# Patient Record
Sex: Female | Born: 1960 | Race: White | Hispanic: No | Smoking: Never smoker
Health system: Southern US, Community
[De-identification: ages and names within clinical notes are randomized; demographics above are authoritative.]

## PROBLEM LIST (undated history)

## (undated) DIAGNOSIS — E1165 Type 2 diabetes mellitus with hyperglycemia: Secondary | ICD-10-CM

## (undated) DIAGNOSIS — K2990 Gastroduodenitis, unspecified, without bleeding: Secondary | ICD-10-CM

## (undated) DIAGNOSIS — J449 Chronic obstructive pulmonary disease, unspecified: Secondary | ICD-10-CM

## (undated) DIAGNOSIS — I509 Heart failure, unspecified: Secondary | ICD-10-CM

## (undated) DIAGNOSIS — S88119A Complete traumatic amputation at level between knee and ankle, unspecified lower leg, initial encounter: Secondary | ICD-10-CM

## (undated) DIAGNOSIS — G459 Transient cerebral ischemic attack, unspecified: Secondary | ICD-10-CM

## (undated) DIAGNOSIS — J189 Pneumonia, unspecified organism: Secondary | ICD-10-CM

## (undated) DIAGNOSIS — IMO0002 Reserved for concepts with insufficient information to code with codable children: Secondary | ICD-10-CM

## (undated) HISTORY — PX: BELOW KNEE LEG AMPUTATION: SUR23

---

## 2019-09-22 DIAGNOSIS — J189 Pneumonia, unspecified organism: Secondary | ICD-10-CM | POA: Diagnosis not present

## 2019-09-22 DIAGNOSIS — I10 Essential (primary) hypertension: Secondary | ICD-10-CM | POA: Diagnosis not present

## 2019-09-22 DIAGNOSIS — I509 Heart failure, unspecified: Secondary | ICD-10-CM | POA: Diagnosis not present

## 2019-09-22 DIAGNOSIS — D649 Anemia, unspecified: Secondary | ICD-10-CM

## 2019-09-22 DIAGNOSIS — R079 Chest pain, unspecified: Secondary | ICD-10-CM | POA: Diagnosis not present

## 2019-09-23 DIAGNOSIS — R079 Chest pain, unspecified: Secondary | ICD-10-CM | POA: Diagnosis not present

## 2019-09-23 DIAGNOSIS — I509 Heart failure, unspecified: Secondary | ICD-10-CM | POA: Diagnosis not present

## 2019-09-23 DIAGNOSIS — J189 Pneumonia, unspecified organism: Secondary | ICD-10-CM | POA: Diagnosis not present

## 2019-09-23 DIAGNOSIS — I10 Essential (primary) hypertension: Secondary | ICD-10-CM | POA: Diagnosis not present

## 2019-09-24 DIAGNOSIS — I509 Heart failure, unspecified: Secondary | ICD-10-CM | POA: Diagnosis not present

## 2019-09-24 DIAGNOSIS — J189 Pneumonia, unspecified organism: Secondary | ICD-10-CM | POA: Diagnosis not present

## 2019-09-24 DIAGNOSIS — I10 Essential (primary) hypertension: Secondary | ICD-10-CM | POA: Diagnosis not present

## 2019-09-24 DIAGNOSIS — R079 Chest pain, unspecified: Secondary | ICD-10-CM | POA: Diagnosis not present

## 2019-09-25 DIAGNOSIS — R079 Chest pain, unspecified: Secondary | ICD-10-CM | POA: Diagnosis not present

## 2019-09-25 DIAGNOSIS — J189 Pneumonia, unspecified organism: Secondary | ICD-10-CM | POA: Diagnosis not present

## 2019-09-25 DIAGNOSIS — I509 Heart failure, unspecified: Secondary | ICD-10-CM | POA: Diagnosis not present

## 2019-09-25 DIAGNOSIS — I10 Essential (primary) hypertension: Secondary | ICD-10-CM | POA: Diagnosis not present

## 2019-10-01 DIAGNOSIS — J189 Pneumonia, unspecified organism: Secondary | ICD-10-CM

## 2019-10-01 DIAGNOSIS — R079 Chest pain, unspecified: Secondary | ICD-10-CM | POA: Diagnosis not present

## 2019-10-01 DIAGNOSIS — I340 Nonrheumatic mitral (valve) insufficiency: Secondary | ICD-10-CM | POA: Diagnosis not present

## 2019-10-01 DIAGNOSIS — I10 Essential (primary) hypertension: Secondary | ICD-10-CM | POA: Diagnosis not present

## 2019-10-01 DIAGNOSIS — D649 Anemia, unspecified: Secondary | ICD-10-CM | POA: Diagnosis not present

## 2019-10-01 DIAGNOSIS — J90 Pleural effusion, not elsewhere classified: Secondary | ICD-10-CM | POA: Diagnosis not present

## 2019-10-01 DIAGNOSIS — I509 Heart failure, unspecified: Secondary | ICD-10-CM

## 2019-10-02 DIAGNOSIS — R079 Chest pain, unspecified: Secondary | ICD-10-CM | POA: Diagnosis not present

## 2019-10-02 DIAGNOSIS — J90 Pleural effusion, not elsewhere classified: Secondary | ICD-10-CM | POA: Diagnosis not present

## 2019-10-02 DIAGNOSIS — I10 Essential (primary) hypertension: Secondary | ICD-10-CM | POA: Diagnosis not present

## 2019-10-02 DIAGNOSIS — D649 Anemia, unspecified: Secondary | ICD-10-CM | POA: Diagnosis not present

## 2019-10-03 DIAGNOSIS — J90 Pleural effusion, not elsewhere classified: Secondary | ICD-10-CM | POA: Diagnosis not present

## 2019-10-03 DIAGNOSIS — R079 Chest pain, unspecified: Secondary | ICD-10-CM | POA: Diagnosis not present

## 2019-10-03 DIAGNOSIS — D649 Anemia, unspecified: Secondary | ICD-10-CM | POA: Diagnosis not present

## 2019-10-03 DIAGNOSIS — I10 Essential (primary) hypertension: Secondary | ICD-10-CM | POA: Diagnosis not present

## 2019-10-04 DIAGNOSIS — R079 Chest pain, unspecified: Secondary | ICD-10-CM | POA: Diagnosis not present

## 2019-10-04 DIAGNOSIS — I10 Essential (primary) hypertension: Secondary | ICD-10-CM | POA: Diagnosis not present

## 2019-10-04 DIAGNOSIS — J90 Pleural effusion, not elsewhere classified: Secondary | ICD-10-CM | POA: Diagnosis not present

## 2019-10-04 DIAGNOSIS — D649 Anemia, unspecified: Secondary | ICD-10-CM | POA: Diagnosis not present

## 2019-10-05 DIAGNOSIS — R079 Chest pain, unspecified: Secondary | ICD-10-CM | POA: Diagnosis not present

## 2019-10-05 DIAGNOSIS — D649 Anemia, unspecified: Secondary | ICD-10-CM | POA: Diagnosis not present

## 2019-10-05 DIAGNOSIS — I10 Essential (primary) hypertension: Secondary | ICD-10-CM | POA: Diagnosis not present

## 2019-10-05 DIAGNOSIS — J90 Pleural effusion, not elsewhere classified: Secondary | ICD-10-CM | POA: Diagnosis not present

## 2019-10-06 DIAGNOSIS — R079 Chest pain, unspecified: Secondary | ICD-10-CM | POA: Diagnosis not present

## 2019-10-06 DIAGNOSIS — D649 Anemia, unspecified: Secondary | ICD-10-CM | POA: Diagnosis not present

## 2019-10-06 DIAGNOSIS — I10 Essential (primary) hypertension: Secondary | ICD-10-CM | POA: Diagnosis not present

## 2019-10-06 DIAGNOSIS — J90 Pleural effusion, not elsewhere classified: Secondary | ICD-10-CM | POA: Diagnosis not present

## 2019-10-15 DIAGNOSIS — J90 Pleural effusion, not elsewhere classified: Secondary | ICD-10-CM

## 2019-10-15 DIAGNOSIS — I10 Essential (primary) hypertension: Secondary | ICD-10-CM

## 2019-10-15 DIAGNOSIS — R109 Unspecified abdominal pain: Secondary | ICD-10-CM

## 2019-10-15 DIAGNOSIS — I509 Heart failure, unspecified: Secondary | ICD-10-CM | POA: Diagnosis not present

## 2019-10-16 DIAGNOSIS — I10 Essential (primary) hypertension: Secondary | ICD-10-CM | POA: Diagnosis not present

## 2019-10-16 DIAGNOSIS — R109 Unspecified abdominal pain: Secondary | ICD-10-CM | POA: Diagnosis not present

## 2019-10-16 DIAGNOSIS — J90 Pleural effusion, not elsewhere classified: Secondary | ICD-10-CM | POA: Diagnosis not present

## 2019-10-16 DIAGNOSIS — I509 Heart failure, unspecified: Secondary | ICD-10-CM | POA: Diagnosis not present

## 2019-10-17 DIAGNOSIS — J90 Pleural effusion, not elsewhere classified: Secondary | ICD-10-CM | POA: Diagnosis not present

## 2019-10-17 DIAGNOSIS — I509 Heart failure, unspecified: Secondary | ICD-10-CM | POA: Diagnosis not present

## 2019-10-17 DIAGNOSIS — I10 Essential (primary) hypertension: Secondary | ICD-10-CM | POA: Diagnosis not present

## 2019-10-17 DIAGNOSIS — R109 Unspecified abdominal pain: Secondary | ICD-10-CM | POA: Diagnosis not present

## 2019-10-18 DIAGNOSIS — R109 Unspecified abdominal pain: Secondary | ICD-10-CM | POA: Diagnosis not present

## 2019-10-18 DIAGNOSIS — I509 Heart failure, unspecified: Secondary | ICD-10-CM | POA: Diagnosis not present

## 2019-10-18 DIAGNOSIS — I10 Essential (primary) hypertension: Secondary | ICD-10-CM | POA: Diagnosis not present

## 2019-10-18 DIAGNOSIS — J90 Pleural effusion, not elsewhere classified: Secondary | ICD-10-CM | POA: Diagnosis not present

## 2019-10-19 DIAGNOSIS — I509 Heart failure, unspecified: Secondary | ICD-10-CM | POA: Diagnosis not present

## 2019-10-19 DIAGNOSIS — R109 Unspecified abdominal pain: Secondary | ICD-10-CM | POA: Diagnosis not present

## 2019-10-19 DIAGNOSIS — J90 Pleural effusion, not elsewhere classified: Secondary | ICD-10-CM | POA: Diagnosis not present

## 2019-10-19 DIAGNOSIS — I10 Essential (primary) hypertension: Secondary | ICD-10-CM | POA: Diagnosis not present

## 2019-10-20 DIAGNOSIS — I313 Pericardial effusion (noninflammatory): Secondary | ICD-10-CM

## 2019-10-20 DIAGNOSIS — I361 Nonrheumatic tricuspid (valve) insufficiency: Secondary | ICD-10-CM | POA: Diagnosis not present

## 2019-10-20 DIAGNOSIS — I2510 Atherosclerotic heart disease of native coronary artery without angina pectoris: Secondary | ICD-10-CM

## 2019-10-20 DIAGNOSIS — I340 Nonrheumatic mitral (valve) insufficiency: Secondary | ICD-10-CM | POA: Diagnosis not present

## 2019-10-20 DIAGNOSIS — R109 Unspecified abdominal pain: Secondary | ICD-10-CM | POA: Diagnosis not present

## 2019-10-20 DIAGNOSIS — J90 Pleural effusion, not elsewhere classified: Secondary | ICD-10-CM | POA: Diagnosis not present

## 2019-10-20 DIAGNOSIS — R0602 Shortness of breath: Secondary | ICD-10-CM

## 2019-10-20 DIAGNOSIS — I10 Essential (primary) hypertension: Secondary | ICD-10-CM | POA: Diagnosis not present

## 2019-10-20 DIAGNOSIS — I509 Heart failure, unspecified: Secondary | ICD-10-CM

## 2019-10-20 DIAGNOSIS — D649 Anemia, unspecified: Secondary | ICD-10-CM

## 2019-10-21 ENCOUNTER — Inpatient Hospital Stay (HOSPITAL_COMMUNITY)
Admission: AD | Admit: 2019-10-21 | Discharge: 2019-11-06 | DRG: 246 | Disposition: A | Discharge status: Skilled Nursing Facility | Payer: Medicaid Other | Source: Other Acute Inpatient Hospital | Attending: Internal Medicine | Admitting: Internal Medicine

## 2019-10-21 ENCOUNTER — Encounter (HOSPITAL_COMMUNITY): Payer: Self-pay | Admitting: Cardiology

## 2019-10-21 DIAGNOSIS — E876 Hypokalemia: Secondary | ICD-10-CM | POA: Diagnosis not present

## 2019-10-21 DIAGNOSIS — R160 Hepatomegaly, not elsewhere classified: Secondary | ICD-10-CM | POA: Diagnosis present

## 2019-10-21 DIAGNOSIS — K219 Gastro-esophageal reflux disease without esophagitis: Secondary | ICD-10-CM | POA: Diagnosis present

## 2019-10-21 DIAGNOSIS — F419 Anxiety disorder, unspecified: Secondary | ICD-10-CM | POA: Diagnosis not present

## 2019-10-21 DIAGNOSIS — I739 Peripheral vascular disease, unspecified: Secondary | ICD-10-CM | POA: Diagnosis present

## 2019-10-21 DIAGNOSIS — I5033 Acute on chronic diastolic (congestive) heart failure: Secondary | ICD-10-CM

## 2019-10-21 DIAGNOSIS — I701 Atherosclerosis of renal artery: Secondary | ICD-10-CM | POA: Diagnosis present

## 2019-10-21 DIAGNOSIS — E1151 Type 2 diabetes mellitus with diabetic peripheral angiopathy without gangrene: Secondary | ICD-10-CM | POA: Diagnosis present

## 2019-10-21 DIAGNOSIS — I5043 Acute on chronic combined systolic (congestive) and diastolic (congestive) heart failure: Secondary | ICD-10-CM | POA: Diagnosis present

## 2019-10-21 DIAGNOSIS — Z7189 Other specified counseling: Secondary | ICD-10-CM

## 2019-10-21 DIAGNOSIS — I509 Heart failure, unspecified: Secondary | ICD-10-CM | POA: Diagnosis not present

## 2019-10-21 DIAGNOSIS — I5031 Acute diastolic (congestive) heart failure: Secondary | ICD-10-CM

## 2019-10-21 DIAGNOSIS — I10 Essential (primary) hypertension: Secondary | ICD-10-CM | POA: Diagnosis not present

## 2019-10-21 DIAGNOSIS — N183 Chronic kidney disease, stage 3 unspecified: Secondary | ICD-10-CM | POA: Diagnosis present

## 2019-10-21 DIAGNOSIS — R0689 Other abnormalities of breathing: Secondary | ICD-10-CM

## 2019-10-21 DIAGNOSIS — Z955 Presence of coronary angioplasty implant and graft: Secondary | ICD-10-CM

## 2019-10-21 DIAGNOSIS — R9389 Abnormal findings on diagnostic imaging of other specified body structures: Secondary | ICD-10-CM

## 2019-10-21 DIAGNOSIS — Z79899 Other long term (current) drug therapy: Secondary | ICD-10-CM

## 2019-10-21 DIAGNOSIS — F329 Major depressive disorder, single episode, unspecified: Secondary | ICD-10-CM | POA: Diagnosis present

## 2019-10-21 DIAGNOSIS — Z794 Long term (current) use of insulin: Secondary | ICD-10-CM

## 2019-10-21 DIAGNOSIS — N179 Acute kidney failure, unspecified: Secondary | ICD-10-CM

## 2019-10-21 DIAGNOSIS — J440 Chronic obstructive pulmonary disease with acute lower respiratory infection: Secondary | ICD-10-CM | POA: Diagnosis present

## 2019-10-21 DIAGNOSIS — R0602 Shortness of breath: Secondary | ICD-10-CM | POA: Diagnosis present

## 2019-10-21 DIAGNOSIS — E1165 Type 2 diabetes mellitus with hyperglycemia: Secondary | ICD-10-CM | POA: Diagnosis present

## 2019-10-21 DIAGNOSIS — I130 Hypertensive heart and chronic kidney disease with heart failure and stage 1 through stage 4 chronic kidney disease, or unspecified chronic kidney disease: Principal | ICD-10-CM | POA: Diagnosis present

## 2019-10-21 DIAGNOSIS — Z833 Family history of diabetes mellitus: Secondary | ICD-10-CM

## 2019-10-21 DIAGNOSIS — I2583 Coronary atherosclerosis due to lipid rich plaque: Secondary | ICD-10-CM | POA: Diagnosis not present

## 2019-10-21 DIAGNOSIS — Z888 Allergy status to other drugs, medicaments and biological substances status: Secondary | ICD-10-CM

## 2019-10-21 DIAGNOSIS — D631 Anemia in chronic kidney disease: Secondary | ICD-10-CM | POA: Diagnosis present

## 2019-10-21 DIAGNOSIS — J90 Pleural effusion, not elsewhere classified: Secondary | ICD-10-CM

## 2019-10-21 DIAGNOSIS — Z89511 Acquired absence of right leg below knee: Secondary | ICD-10-CM

## 2019-10-21 DIAGNOSIS — D500 Iron deficiency anemia secondary to blood loss (chronic): Secondary | ICD-10-CM | POA: Diagnosis not present

## 2019-10-21 DIAGNOSIS — R112 Nausea with vomiting, unspecified: Secondary | ICD-10-CM

## 2019-10-21 DIAGNOSIS — I150 Renovascular hypertension: Secondary | ICD-10-CM

## 2019-10-21 DIAGNOSIS — Z9889 Other specified postprocedural states: Secondary | ICD-10-CM

## 2019-10-21 DIAGNOSIS — U071 COVID-19: Secondary | ICD-10-CM | POA: Diagnosis present

## 2019-10-21 DIAGNOSIS — I2510 Atherosclerotic heart disease of native coronary artery without angina pectoris: Secondary | ICD-10-CM | POA: Diagnosis present

## 2019-10-21 DIAGNOSIS — I5032 Chronic diastolic (congestive) heart failure: Secondary | ICD-10-CM | POA: Diagnosis present

## 2019-10-21 DIAGNOSIS — I161 Hypertensive emergency: Secondary | ICD-10-CM | POA: Diagnosis present

## 2019-10-21 DIAGNOSIS — Z515 Encounter for palliative care: Secondary | ICD-10-CM

## 2019-10-21 DIAGNOSIS — R109 Unspecified abdominal pain: Secondary | ICD-10-CM

## 2019-10-21 DIAGNOSIS — D649 Anemia, unspecified: Secondary | ICD-10-CM

## 2019-10-21 DIAGNOSIS — J449 Chronic obstructive pulmonary disease, unspecified: Secondary | ICD-10-CM | POA: Diagnosis not present

## 2019-10-21 DIAGNOSIS — Z8673 Personal history of transient ischemic attack (TIA), and cerebral infarction without residual deficits: Secondary | ICD-10-CM

## 2019-10-21 DIAGNOSIS — F064 Anxiety disorder due to known physiological condition: Secondary | ICD-10-CM | POA: Diagnosis present

## 2019-10-21 DIAGNOSIS — J9621 Acute and chronic respiratory failure with hypoxia: Secondary | ICD-10-CM | POA: Diagnosis present

## 2019-10-21 DIAGNOSIS — N171 Acute kidney failure with acute cortical necrosis: Secondary | ICD-10-CM | POA: Diagnosis not present

## 2019-10-21 DIAGNOSIS — E785 Hyperlipidemia, unspecified: Secondary | ICD-10-CM | POA: Diagnosis present

## 2019-10-21 DIAGNOSIS — Z9981 Dependence on supplemental oxygen: Secondary | ICD-10-CM

## 2019-10-21 DIAGNOSIS — E7800 Pure hypercholesterolemia, unspecified: Secondary | ICD-10-CM | POA: Diagnosis present

## 2019-10-21 DIAGNOSIS — J1282 Pneumonia due to coronavirus disease 2019: Secondary | ICD-10-CM | POA: Diagnosis present

## 2019-10-21 DIAGNOSIS — J810 Acute pulmonary edema: Secondary | ICD-10-CM | POA: Diagnosis not present

## 2019-10-21 DIAGNOSIS — D259 Leiomyoma of uterus, unspecified: Secondary | ICD-10-CM | POA: Diagnosis present

## 2019-10-21 DIAGNOSIS — R404 Transient alteration of awareness: Secondary | ICD-10-CM | POA: Diagnosis not present

## 2019-10-21 DIAGNOSIS — J918 Pleural effusion in other conditions classified elsewhere: Secondary | ICD-10-CM | POA: Diagnosis present

## 2019-10-21 DIAGNOSIS — Z885 Allergy status to narcotic agent status: Secondary | ICD-10-CM

## 2019-10-21 DIAGNOSIS — Z8249 Family history of ischemic heart disease and other diseases of the circulatory system: Secondary | ICD-10-CM

## 2019-10-21 DIAGNOSIS — J189 Pneumonia, unspecified organism: Secondary | ICD-10-CM

## 2019-10-21 DIAGNOSIS — E1152 Type 2 diabetes mellitus with diabetic peripheral angiopathy with gangrene: Secondary | ICD-10-CM | POA: Diagnosis present

## 2019-10-21 HISTORY — DX: Chronic diastolic (congestive) heart failure: I50.32

## 2019-10-21 HISTORY — DX: Type 2 diabetes mellitus with hyperglycemia: E11.65

## 2019-10-21 HISTORY — DX: Reserved for concepts with insufficient information to code with codable children: IMO0002

## 2019-10-21 HISTORY — DX: Gastroduodenitis, unspecified, without bleeding: K29.90

## 2019-10-21 HISTORY — DX: Essential (primary) hypertension: I10

## 2019-10-21 HISTORY — DX: Peripheral vascular disease, unspecified: I73.9

## 2019-10-21 HISTORY — DX: Complete traumatic amputation at level between knee and ankle, unspecified lower leg, initial encounter: S88.119A

## 2019-10-21 HISTORY — DX: Transient cerebral ischemic attack, unspecified: G45.9

## 2019-10-21 HISTORY — DX: Pneumonia, unspecified organism: J18.9

## 2019-10-21 HISTORY — DX: Chronic obstructive pulmonary disease, unspecified: J44.9

## 2019-10-21 LAB — CBC
HCT: 27.8 % — ABNORMAL LOW (ref 36.0–46.0)
Hemoglobin: 8.9 g/dL — ABNORMAL LOW (ref 12.0–15.0)
MCH: 25.9 pg — ABNORMAL LOW (ref 26.0–34.0)
MCHC: 32 g/dL (ref 30.0–36.0)
MCV: 80.8 fL (ref 80.0–100.0)
Platelets: 176 10*3/uL (ref 150–400)
RBC: 3.44 MIL/uL — ABNORMAL LOW (ref 3.87–5.11)
RDW: 14 % (ref 11.5–15.5)
WBC: 8 10*3/uL (ref 4.0–10.5)
nRBC: 0 % (ref 0.0–0.2)

## 2019-10-21 LAB — COMPREHENSIVE METABOLIC PANEL
ALT: 17 U/L (ref 0–44)
AST: 18 U/L (ref 15–41)
Albumin: 2.6 g/dL — ABNORMAL LOW (ref 3.5–5.0)
Alkaline Phosphatase: 59 U/L (ref 38–126)
Anion gap: 10 (ref 5–15)
BUN: 33 mg/dL — ABNORMAL HIGH (ref 6–20)
CO2: 25 mmol/L (ref 22–32)
Calcium: 8.3 mg/dL — ABNORMAL LOW (ref 8.9–10.3)
Chloride: 102 mmol/L (ref 98–111)
Creatinine, Ser: 1.22 mg/dL — ABNORMAL HIGH (ref 0.44–1.00)
GFR calc Af Amer: 56 mL/min — ABNORMAL LOW (ref >60)
GFR calc non Af Amer: 48 mL/min — ABNORMAL LOW (ref >60)
Glucose, Bld: 214 mg/dL — ABNORMAL HIGH (ref 70–99)
Potassium: 3.7 mmol/L (ref 3.5–5.1)
Sodium: 137 mmol/L (ref 135–145)
Total Bilirubin: 0.8 mg/dL (ref 0.3–1.2)
Total Protein: 5.9 g/dL — ABNORMAL LOW (ref 6.5–8.1)

## 2019-10-21 LAB — GLUCOSE, CAPILLARY
Glucose-Capillary: 205 mg/dL — ABNORMAL HIGH (ref 70–99)
Glucose-Capillary: 226 mg/dL — ABNORMAL HIGH (ref 70–99)

## 2019-10-21 LAB — PROCALCITONIN: Procalcitonin: 0.2 ng/mL

## 2019-10-21 LAB — PROTIME-INR
INR: 1 (ref 0.8–1.2)
Prothrombin Time: 12.9 seconds (ref 11.4–15.2)

## 2019-10-21 LAB — LACTIC ACID, PLASMA
Lactic Acid, Venous: 0.8 mmol/L (ref 0.5–1.9)
Lactic Acid, Venous: 1.1 mmol/L (ref 0.5–1.9)

## 2019-10-21 LAB — BRAIN NATRIURETIC PEPTIDE: B Natriuretic Peptide: 1040.2 pg/mL — ABNORMAL HIGH (ref 0.0–100.0)

## 2019-10-21 MED ORDER — NITROGLYCERIN 0.4 MG SL SUBL
0.4000 mg | SUBLINGUAL_TABLET | SUBLINGUAL | Status: DC | PRN
  Administered 2019-10-22 – 2019-11-01 (×2): 0.4 mg via SUBLINGUAL
  Filled 2019-10-21 (×2): qty 1

## 2019-10-21 MED ORDER — ONDANSETRON HCL 4 MG/2ML IJ SOLN
4.0000 mg | Freq: Four times a day (QID) | INTRAMUSCULAR | Status: DC | PRN
  Administered 2019-10-21 – 2019-10-22 (×2): 4 mg via INTRAVENOUS
  Administered 2019-10-22: 8 mg via INTRAVENOUS
  Filled 2019-10-21 (×3): qty 2

## 2019-10-21 MED ORDER — SODIUM CHLORIDE 0.9 % IV SOLN
1.0000 g | INTRAVENOUS | Status: DC
  Administered 2019-10-21: 1 g via INTRAVENOUS
  Filled 2019-10-21: qty 10

## 2019-10-21 MED ORDER — AZITHROMYCIN 500 MG PO TABS
500.0000 mg | ORAL_TABLET | Freq: Every day | ORAL | Status: DC
  Filled 2019-10-21: qty 1

## 2019-10-21 MED ORDER — SODIUM CHLORIDE 0.9 % IV SOLN: 250.0000 mL | INTRAVENOUS | Status: DC | PRN

## 2019-10-21 MED ORDER — HYDRALAZINE HCL 50 MG PO TABS: 100.0000 mg | ORAL_TABLET | Freq: Four times a day (QID) | ORAL | Status: DC

## 2019-10-21 MED ORDER — FUROSEMIDE 10 MG/ML IJ SOLN
80.0000 mg | Freq: Two times a day (BID) | INTRAMUSCULAR | Status: DC
  Administered 2019-10-21 – 2019-10-22 (×3): 80 mg via INTRAVENOUS
  Administered 2019-10-22: 40 mg via INTRAVENOUS
  Administered 2019-10-23: 80 mg via INTRAVENOUS
  Filled 2019-10-21 (×5): qty 8

## 2019-10-21 MED ORDER — ACETAMINOPHEN 325 MG PO TABS
650.0000 mg | ORAL_TABLET | ORAL | Status: DC | PRN
  Filled 2019-10-21: qty 2

## 2019-10-21 MED ORDER — INSULIN ASPART 100 UNIT/ML ~~LOC~~ SOLN
0.0000 [IU] | Freq: Three times a day (TID) | SUBCUTANEOUS | Status: DC
  Administered 2019-10-23: 8 [IU] via SUBCUTANEOUS
  Administered 2019-10-23: 5 [IU] via SUBCUTANEOUS
  Administered 2019-10-24: 8 [IU] via SUBCUTANEOUS
  Administered 2019-10-24: 5 [IU] via SUBCUTANEOUS
  Administered 2019-10-24: 3 [IU] via SUBCUTANEOUS
  Administered 2019-10-25: 15 [IU] via SUBCUTANEOUS
  Administered 2019-10-25: 5 [IU] via SUBCUTANEOUS
  Administered 2019-10-25 – 2019-10-26 (×2): 8 [IU] via SUBCUTANEOUS
  Administered 2019-10-26: 5 [IU] via SUBCUTANEOUS

## 2019-10-21 MED ORDER — ATORVASTATIN CALCIUM 80 MG PO TABS
80.0000 mg | ORAL_TABLET | Freq: Every day | ORAL | Status: DC
  Administered 2019-10-22 – 2019-11-05 (×14): 80 mg via ORAL
  Filled 2019-10-21 (×15): qty 1

## 2019-10-21 MED ORDER — SODIUM CHLORIDE 0.9% FLUSH: 3.0000 mL | INTRAVENOUS | Status: DC | PRN

## 2019-10-21 MED ORDER — INSULIN GLARGINE 100 UNIT/ML ~~LOC~~ SOLN
20.0000 [IU] | Freq: Every day | SUBCUTANEOUS | Status: DC
  Administered 2019-10-21 – 2019-10-25 (×5): 20 [IU] via SUBCUTANEOUS
  Filled 2019-10-21 (×8): qty 0.2

## 2019-10-21 MED ORDER — HYDRALAZINE HCL 50 MG PO TABS
100.0000 mg | ORAL_TABLET | Freq: Three times a day (TID) | ORAL | Status: DC
  Administered 2019-10-22 – 2019-10-24 (×6): 100 mg via ORAL
  Filled 2019-10-21 (×7): qty 2

## 2019-10-21 MED ORDER — SPIRONOLACTONE 25 MG PO TABS
25.0000 mg | ORAL_TABLET | Freq: Every day | ORAL | Status: DC
  Administered 2019-10-22: 25 mg via ORAL
  Filled 2019-10-21: qty 1

## 2019-10-21 MED ORDER — ASPIRIN EC 81 MG PO TBEC
81.0000 mg | DELAYED_RELEASE_TABLET | Freq: Every day | ORAL | Status: DC
  Administered 2019-10-23 – 2019-11-06 (×13): 81 mg via ORAL
  Filled 2019-10-21 (×15): qty 1

## 2019-10-21 MED ORDER — PANTOPRAZOLE SODIUM 40 MG PO TBEC
40.0000 mg | DELAYED_RELEASE_TABLET | Freq: Every day | ORAL | Status: DC
  Administered 2019-10-22: 40 mg via ORAL
  Filled 2019-10-21: qty 1

## 2019-10-21 MED ORDER — MORPHINE SULFATE (PF) 2 MG/ML IV SOLN
2.0000 mg | Freq: Once | INTRAVENOUS | Status: AC
  Administered 2019-10-21: 23:00:00 2 mg via INTRAVENOUS
  Filled 2019-10-21: qty 1

## 2019-10-21 MED ORDER — HYDROCODONE-ACETAMINOPHEN 5-325 MG PO TABS: 1.0000 | ORAL_TABLET | Freq: Four times a day (QID) | ORAL | Status: DC | PRN

## 2019-10-21 MED ORDER — BUSPIRONE HCL 5 MG PO TABS
5.0000 mg | ORAL_TABLET | Freq: Every day | ORAL | Status: DC
  Administered 2019-10-22 – 2019-11-06 (×15): 5 mg via ORAL
  Filled 2019-10-21 (×15): qty 1

## 2019-10-21 MED ORDER — AMLODIPINE BESYLATE 10 MG PO TABS
10.0000 mg | ORAL_TABLET | Freq: Every day | ORAL | Status: DC
  Administered 2019-10-22 – 2019-10-23 (×2): 10 mg via ORAL
  Filled 2019-10-21 (×2): qty 1

## 2019-10-21 MED ORDER — SODIUM CHLORIDE 0.9% FLUSH
3.0000 mL | Freq: Two times a day (BID) | INTRAVENOUS | Status: DC
  Administered 2019-10-21: 3 mL via INTRAVENOUS

## 2019-10-21 MED ORDER — ISOSORBIDE MONONITRATE ER 60 MG PO TB24: 120.0000 mg | ORAL_TABLET | Freq: Every day | ORAL | Status: DC

## 2019-10-21 MED ORDER — FLUOXETINE HCL 20 MG PO CAPS
40.0000 mg | ORAL_CAPSULE | Freq: Every day | ORAL | Status: DC
  Administered 2019-10-23 – 2019-11-06 (×14): 40 mg via ORAL
  Filled 2019-10-21 (×16): qty 2

## 2019-10-21 MED ORDER — IPRATROPIUM-ALBUTEROL 0.5-2.5 (3) MG/3ML IN SOLN
3.0000 mL | RESPIRATORY_TRACT | Status: DC | PRN
  Administered 2019-10-22 – 2019-11-06 (×4): 3 mL via RESPIRATORY_TRACT
  Filled 2019-10-21 (×4): qty 3

## 2019-10-21 MED ORDER — HEPARIN SODIUM (PORCINE) 5000 UNIT/ML IJ SOLN
5000.0000 [IU] | Freq: Three times a day (TID) | INTRAMUSCULAR | Status: DC
  Filled 2019-10-21: qty 1

## 2019-10-21 MED ORDER — HYDRALAZINE HCL 10 MG PO TABS
10.0000 mg | ORAL_TABLET | Freq: Four times a day (QID) | ORAL | Status: DC
  Administered 2019-10-21: 18:00:00 10 mg via ORAL
  Filled 2019-10-21: qty 1

## 2019-10-21 MED ORDER — LABETALOL HCL 200 MG PO TABS
300.0000 mg | ORAL_TABLET | Freq: Two times a day (BID) | ORAL | Status: DC
  Filled 2019-10-21: qty 1

## 2019-10-21 MED ORDER — ALPRAZOLAM 0.25 MG PO TABS
0.2500 mg | ORAL_TABLET | Freq: Two times a day (BID) | ORAL | Status: DC | PRN
  Administered 2019-10-24 – 2019-11-05 (×7): 0.25 mg via ORAL
  Filled 2019-10-21 (×7): qty 1

## 2019-10-21 NOTE — H&P (Addendum)
Cardiology Consult:   Patient ID: Jamie [name] MRN: [id number]; DOB: November 25, 1960   Admission date: 10/21/2019  Primary Care Provider: Patient, No Pcp Per Primary Cardiologist: No primary care provider on file. [facility] Primary Electrophysiologist:  None  Chief Complaint:  SOB  [name] is a 59 y.o. female who is being seen today for the evaluation of diastolic HF with pl. effusions at the request of Dr. Roosevelt Locks.  Patient Profile:   Jamie [name] is a 59 y.o. female with Hx of chronic systolic HF, HTN, SOB with Rt BKA  COPD on home oxygen, followed by [facility] cardiology Dr. Novella Rob.    History of Present Illness:   Ms. Jamie [name] with above hx transferred from [facility] She has had echo with [facility] 07/2019 with EF 55-60% moderate concentric hypertrophy mild anteroseptal hypokinesis and mild inferoseptal hypokinesis.  She had thoracentesis at that time 1200 cc removed.   Pt lives in SNF   Recent hospitalization at [facility] for acute on chronic respiratory failure, Bilateral pl effusion and acute diastolic HF X 2 (5/00/93-03/23/81 and 10/01/19-10/06/19)  + thoracentesis 10/04/19 of 1 L   (over 10 months pt has had 5 thoracentesis)  Echo 10/01/19 with EF 55-60% with restrictive LV filling pattern consistent with elevated LA pressure and borderline concentric LVH  10/20/19 Echo  most recent 10/20/19 with 55-60% with mild concentric LVH, both mean arterial pressure and LVEDP are elevated.  Mild MR and Mild TR trace PR RV systolic pressure is 48 mmHg      Back with admit to [facility] 10/15/19 for SOB and hypertensive crisis with BP 209/99. sp02 was 99% on 3 L [place] her pro Pro BNP was 9050, (down from 11,600 10/02/19)  Troponin I neg <0.01 X 2  COVID neg.  CT abd and pelvis with large bilateral pl effusions with adjacent subsegmental atelectasis.  She was given IV lasix total of 80 mg  And IV vasotec.  Also IV Ntg DRIP    She also had abd pain so zofran and morphine given.  Her CT of abd with liver lesion 1.5 cm ill  defined enhancement needs MRI with contrast but due to rising cr have been unable to do.  Now back on home amlodipine 5 mg, hydralazine 100 mg po every 8 hours, imdur labetalol 300 mg every 8 hrs, her ACE held due to rising Cr.  Diabetes on insulin sliding scale. With Hgb A1c of 7.1, on lasix 60 mg every 12 hours.    She had thoracentesis 08/14/20 with 1550 ml removed --pl fluid 1.0 albumin, serum albumin 3.2, pleural LDH 168, serum 778, pleural to serum protein ration < 0.5 and pleural to serum LDH issue <0.6  Post procedure CXR with resolution of Lt pleural effusion but pulmonary venous congestion , bilateral interstitial prominence and small rt pl. Effusion.  With multiple issues pt wanted to transfer to [facility] but they were not accepting pt to floor. She needs more complete workup so was accepted by Advanced HF team for management and Rt and Lt cardiac cath   Her coags have been elevated as well.    Currently sitting up in bed with acute SOB, nausea. She tells me she has chest pain that comes and goes.  She has never had cardiac cath.   EKG:  The ECG that was done 10/15/19  was personally reviewed and demonstrates SR with low voltage QRS, septal infarct.    Heart Pathway Score:     Past Medical History:  Diagnosis Date  .  Amputation below knee ([place]) RT   . Chronic diastolic HF (heart failure) ([place]) 10/21/2019  . COPD (chronic obstructive pulmonary disease) ([place])   . Diabetes type 2, uncontrolled ([place])   . Gastritis and duodenitis   . HTN (hypertension) 10/21/2019  . PAD (peripheral artery disease) ([place]) 10/21/2019  . PAD (peripheral artery disease) ([place])   . PNA (pneumonia)   . TIA (transient ischemic attack)     Past Surgical History:  Procedure Laterality Date  . BELOW KNEE LEG AMPUTATION Right      Medications Prior to Admission: Prior to Admission medications   Not on File   home meds tylenol prn albuterol inhaler 2 puffs prn q 4 hours, lipitor 80 mg daily, Bupropion 100 mg BID,  buspirone 5 mg every am CA+ (tums) 600 every 12 hours Vit D3 1250 mcg , plavix 75 mg daily, fluoxetine 40 mg every AM, lasix 60 mg BID  hydarlazine 100 mg every 8 hours Novolog 10 units TID AS Insulin glargine kwikpem 40 units SQ Q HS imdur 120 mg every Hs Latealolol 300 mg every 12 hours lisinopril 20 BID now on hold Melatonin PEG  Electrolytes Miralax 17 gm daiy Pantoprazole 40 mg daily, K+ 20 meq daily ondansetron 8 mg every 12 hours prn amlodipine 5 mg daily NTG prn albuteropl nebs QID  Alprazolam 0.5 mg Q 6 hr prn, loperamide 2 mg every 6 hours prn spironolactone ws 12.5 mg daily now 25   Allergies:   Not on File  Social History:   Social History   Socioeconomic History  . Marital status: Unknown    Spouse name: Not on file  . Number of children: Not on file  . Years of education: Not on file  . Highest education level: Not on file  Occupational History  . Not on file  Tobacco Use  . Smoking status: Not on file  Substance and Sexual Activity  . Alcohol use: Not on file  . Drug use: Not on file  . Sexual activity: Not on file  Other Topics Concern  . Not on file  Social History Narrative  . Not on file   Social Determinants of Health   Financial Resource Strain:   . Difficulty of Paying Living Expenses: Not on file  Food Insecurity:   . Worried About Charity fundraiser in the Last Year: Not on file  . Ran Out of Food in the Last Year: Not on file  Transportation Needs:   . Lack of Transportation (Medical): Not on file  . Lack of Transportation (Non-Medical): Not on file  Physical Activity:   . Days of Exercise per Week: Not on file  . Minutes of Exercise per Session: Not on file  Stress:   . Feeling of Stress : Not on file  Social Connections:   . Frequency of Communication with Friends and Family: Not on file  . Frequency of Social Gatherings with Friends and Family: Not on file  . Attends Religious Services: Not on file  . Active Member of Clubs or  Organizations: Not on file  . Attends Archivist Meetings: Not on file  . Marital Status: Not on file  Intimate Partner Violence:   . Fear of Current or Ex-Partner: Not on file  . Emotionally Abused: Not on file  . Physically Abused: Not on file  . Sexually Abused: Not on file    Family History:   The patient's family history includes Diabetes in her brother; Heart attack in  her paternal grandmother; Hypertension in her brother and maternal grandmother.    ROS:  Please see the history of present illness.  General:no colds or fevers, no weight changes Skin:no rashes or ulcers HEENT:no blurred vision, no congestion CV:see HPI PUL:see HPI GI:no diarrhea constipation or melena, no indigestion GU:no hematuria, no dysuria MS:no joint pain, no claudication Neuro:no syncope, no lightheadedness Endo:no diabetes, no thyroid disease   All other ROS reviewed and negative.     Physical Exam/Data:   Vitals:   10/21/19 1520 10/21/19 1803  BP: (!) 160/71 (!) 156/79  Pulse: 69 75  Resp: 18 18  Temp: 98.3 F (36.8 C)   TempSrc: Oral   SpO2: 98% 97%  Weight: 70.1 kg   Height: 5\' 3"  (1.6 m)    No intake or output data in the 24 hours ending 10/21/19 1814 Last 3 Weights 10/21/2019  Weight (lbs) 154 lb 8.7 oz  Weight (kg) 70.1 kg     Body mass index is 27.38 kg/m.  General: frail female in acute distress with SOB and nausea   HEENT: normal Lymph: no adenopathy Neck: + JVD Endocrine:  No thryomegaly Vascular: No carotid bruits; ? Lt pedal pulse and Rt BKA  Cardiac:  normal S1, S2; RRR; no murmur gallup rub or click Lungs:  Diminished on Rt and few rales on lt  to auscultation bilaterally, no wheezing, rhonchi   Abd: soft, extreme tenderness diffuse. no hepatomegaly  Ext: no lower ext edema Musculoskeletal:  No deformities, BUE and BLE strength normal and equal with Rt BKA Skin: warm and dry  Neuro:  Alert and oriented, no focal abnormalities noted Psych:  Normal to  flat affect     Relevant CV Studies: Echo 10/01/19 with EF 55-60% with restrictive LV filling pattern consistent with elevated LA pressure and borderline concentric LVH  10/20/19 Echo  most recent 10/20/19 with 55-60% with mild concentric LVH, both mean arterial pressure and LVEDP are elevated.  Mild MR and Mild TR trace PR RV systolic pressure is 48 mmHg     Laboratory Data:  High Sensitivity Troponin:  No results for input(s): TROPONINIHS in the last 720 hours.    ChemistryNo results for input(s): NA, K, CL, CO2, GLUCOSE, BUN, CREATININE, CALCIUM, GFRNONAA, GFRAA, ANIONGAP in the last 168 hours.  No results for input(s): PROT, ALBUMIN, AST, ALT, ALKPHOS, BILITOT in the last 168 hours. HematologyNo results for input(s): WBC, RBC, HGB, HCT, MCV, MCH, MCHC, RDW, PLT in the last 168 hours. BNPNo results for input(s): BNP, PROBNP in the last 168 hours.  DDimer No results for input(s): DDIMER in the last 168 hours.   Radiology/Studies:  No results found.     HEAR Score (for undifferentiated chest pain):     5 but may be related to pulmonary as well.    Assessment and Plan:   1. Acute on chronic diastolic HF with most recent echo 10/20/19 with 55-60% with mild concentric LVH, both mean arterial pressure and LVEDP are elevated.  Mild MR and Mild TR trace PR RV systolic pressure is 48 mmHg  Has been receiving 60 of Lasix BID until today and now 80 mg BIDon spironolactone 25  Plan for Rt and Lt cardiac cath if labs stable.   Currently she cannot tolerate head below 60 degrees.  2. Pleural effusions, may have had up to 5 thoracentesis since December I know of 3.  Check CXR, last thoracentesis on Lt on 10/16/19.   3. Auto anticoagulation with elevated INR. Will  recheck. And eval LFTs 4. Chronic anemia Hgb today 8.2 WBC 2.9 plts 158   5. CKD 3 but will recheck, last Cr 1.20 and BUN 35 Na 135 and K+ 3.8 6. IDDM SSI  7. Possible CAD I find no record of cardiac cath.  But with diabetes high  risk. 8. Liver mass eva by GI ? New to recent admit   + abd pain, with hx of gastroparesis. 9. 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   Dr. Aundra Dubin to see pt  For questions or updates, please contact [place] Please consult [email or web address] for contact info under   Signed, Cecilie Kicks, NP  10/21/2019 6:14 PM   Patient seen with NP, agree with the above note.   Extensive history as outlined above.  Multiple admissions with diastolic CHF, hypertensive emergency.  Has been in the hospital at [facility] with diuresis and bilateral thoracenteses (transudates, cytology negative).  She has been short of breath and has had abdominal pain.  CT abdomen/pelvis from [facility] showed a 1.5 cm liver mass, no other significant abnormalities.  CXR has been suggestive of left-sided PNA.   Today, creatinine was 1.2.  She is hypertensive.  She continues to be short of breath and orthopneic.  After eating dinner, she developed severe abdominal pain which is ongoing, primarily epigastric.   General: uncomfortable.  Neck: JVP 12-14 cm, no thyromegaly or thyroid nodule.  Lungs: Decreased at bases.  CV: Nondisplaced PMI.  Heart regular S1/S2, no S3/S4, no murmur.  No peripheral edema.  No carotid bruit.  Normal pedal pulses.  Abdomen: Soft, nondistended.  Tender diffusely, no rebound or guarding.  Skin: Intact without lesions or rashes.  Neurologic: Alert and oriented x 3.  Psych: Normal affect. Extremities: No clubbing or cyanosis. S/p right BKA.  HEENT: Normal.   1. Acute on chronic diastolic CHF: Last echo at [facility] 2/21 with EF 55-60%, restrictive diastolic function.  Creatinine 1.2 today.  Multiple admissions with HTN and CHF exacerbation.  On exam, she is volume overloaded.  - Lasix 80 mg IV bid for now. Follow creatinine closely.  - May eventually benefit from Cardiomems placement.  - Plan for RHC/LHC when more diuresed and abdominal pain resolved/treated.  2. Pleural effusions: Bilateral.  Had  thoracenteses this admission at [facility].  Transudates with negative cytology. Suspect due to CHF.  3. HTN: Poorly controlled, admitted with hypertensive emergency/CHF.  - Continue amlodipine 10 mg daily.  - Continue hydralazine 100 mg tid, Imdur 120 mg daily.  - Continue labetalol 300 bid. - Continue spironolactone  - Hold off on ACEI/ARB with recent AKI.  4. AKI: Creatinine initially [place] this admission but creatinine today per report at [facility] was 1.2.  Follow closely.  5. ID: Possible LLL PNA.  - Ceftriaxone/azithromycin.  6. Abdominal pain: CT abdomen earlier in admission with 1.5 cm liver lesion but no other significant abdominal abnormality.  She has had abdominal pain throughout this admission, apparently was better earlier today then worse after she ate some dinner.  Currently with diffuse tenderness, no rebound or guarding.  She was nauseated as well and has had some Zofran.  No diarrhea.  - Will obtain KUB.  - Send lactate, check LFTs.  - She is on Protonix.  - ?Symptomatic cholelithiasis/cholecystitis => RIUQ [place]. - As long as creatinine remains stable, she will need liver MRI to assess liver lesion (ordered).  7. COPD: Per report on 3L home oxygen.  8. DM: SSI, per [facility].   [employer]  10/21/2019  

## 2019-10-21 NOTE — Progress Notes (Addendum)
RN spoke with Fransico Him, MD regarding orders for patient. Radford Pax, MD states that she will continue to contact Aundra Dubin, MD regarding orders. Patient currently lying in bed, eyes closed, bed lowest position, call bell within reach.

## 2019-10-21 NOTE — H&P (Addendum)
History and Physical    Jamie [name] YOV:[id number] DOB: 1960/09/27 DOA: 10/21/2019  PCP: Patient, No Pcp Per   Patient coming from: [facility].  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  Prior to this, patient was recently admitted and discharged from [facility] tow times 01/30-2/2 and 2/8-2/13 due to acute on chronic respiratory failure secondary to bilateral pleural effusion from acute exacerbation of diastolic CHF and had thoracentesis done on 02/11. Patient denied fever, chills, cough, vomiting, blood in stool.  Initial workup showed HTN emergency with worsening of baseline hypoxia, COVID was negative .  CT abdomen and pelvis with contrast showed large bilateral pleural effusions with adjacent subsegmental atelectasis.  Patient was given breathing treatment, IV Lasix IV and aggressive BP management. However, her creatinine gradually trending up and diuretics was held briefly.She underwent thoracentesis on the left on 2/23 with 1500 mL out and on the right on 02/26 with 1 L out, fluid was transudative, negative for malignancy. Patient was seen by Cardiology, who recommended patient transferred to [facility] for putative right sided cardio cath.  Symptoms started improved after resuming diuretics and dose increased on 2/28.  Workup in [place] also found left PNA for which pt has been on ABX and liver mass needs MRI with contrast which likely will be outpatient given her worsening kidney function.  Today patient continue to complains about SOB, and occasional cough.  She also has a chronic epigastric pain, on and off, not associated with diet, no feeling of N/V no diarrhea.  Review of Systems: As per HPI otherwise 10 point review of systems negative.    Past Medical History:  Diagnosis Date  . Amputation below knee ([place]) RT   . Chronic diastolic HF (heart failure) ([place]) 10/21/2019  . COPD (chronic obstructive pulmonary disease) ([place])   . Diabetes type 2, uncontrolled ([place])   . Gastritis and duodenitis   . HTN (hypertension) 10/21/2019  . PAD (peripheral artery disease) ([place]) 10/21/2019  . PAD (peripheral artery disease) ([place])   . PNA (pneumonia)   . TIA (transient ischemic attack)     Past Surgical History:  Procedure Laterality Date  . BELOW KNEE LEG AMPUTATION Right      has no history on file for tobacco, alcohol, and drug.  Not on File  Family History  Problem Relation Age of Onset  . Hypertension Brother   . Diabetes Brother   . Hypertension Maternal Grandmother   . Heart attack Paternal Grandmother      Prior to Admission medications   Not on File    Physical Exam: Vitals:   10/21/19 1520 10/21/19 1803  BP: (!) 160/71 (!) 156/79  Pulse: 69 75  Resp: 18 18  Temp: 98.3 F (36.8 C)   TempSrc: Oral   SpO2: 98% 97%  Weight: 70.1 kg   Height: 5\' 3"  (1.6 m)     Constitutional: NAD, calm, comfortable Vitals:   10/21/19 1520 10/21/19 1803  BP: (!) 160/71 (!) 156/79  Pulse: 69 75  Resp: 18 18  Temp: 98.3 F (36.8 C)   TempSrc: Oral   SpO2: 98%  97%  Weight: 70.1 kg   Height: 5\' 3"  (1.6 m)    Eyes: PERRL, lids and conjunctivae normal ENMT: Mucous membranes are moist. Posterior pharynx clear of any exudate or lesions.Normal dentition.  Neck: normal, supple, no masses, no thyromegaly Respiratory: clear to auscultation bilaterally, no wheezing, no crackles. Normal respiratory effort. No accessory muscle use.  Cardiovascular: Regular rate and rhythm, no murmurs / rubs / gallops. No extremity edema. 2+ pedal pulses. No  carotid bruits.  Abdomen: no tenderness, no masses palpated. No hepatosplenomegaly. Bowel sounds positive.  Musculoskeletal: Right BKA  Skin: no rashes, lesions, ulcers. No induration Neurologic: CN 2-12 grossly intact. Sensation intact, DTR normal. Strength 5/5 in all 4.  Psychiatric: Normal judgment and insight. Alert and oriented x 3. Normal mood.    Labs on Admission: I have personally reviewed following labs and imaging studies  CBC: No results for input(s): WBC, NEUTROABS, HGB, HCT, MCV, PLT in the last 168 hours. Basic Metabolic Panel: No results for input(s): NA, K, CL, CO2, GLUCOSE, BUN, CREATININE, CALCIUM, MG, PHOS in the last 168 hours. GFR: CrCl cannot be calculated (No successful lab value found.). Liver Function Tests: No results for input(s): AST, ALT, ALKPHOS, BILITOT, PROT, ALBUMIN in the last 168 hours. No results for input(s): LIPASE, AMYLASE in the last 168 hours. No results for input(s): AMMONIA in the last 168 hours. Coagulation Profile: No results for input(s): INR, PROTIME in the last 168 hours. Cardiac Enzymes: No results for input(s): CKTOTAL, CKMB, CKMBINDEX, TROPONINI in the last 168 hours. BNP (last 3 results) No results for input(s): PROBNP in the last 8760 hours. HbA1C: No results for input(s): HGBA1C in the last 72 hours. CBG: Recent Labs  Lab 10/21/19 1706  GLUCAP 205*   Lipid Profile: No results for input(s): CHOL, HDL, LDLCALC, TRIG, CHOLHDL, LDLDIRECT in the last 72 hours. Thyroid Function Tests: No results for input(s): TSH, T4TOTAL, FREET4, T3FREE, THYROIDAB in the last 72 hours. Anemia Panel: No results for input(s): VITAMINB12, FOLATE, FERRITIN, TIBC, IRON, RETICCTPCT in the last 72 hours. Urine analysis: No results found for: COLORURINE, APPEARANCEUR, LABSPEC, PHURINE, GLUCOSEU, HGBUR, BILIRUBINUR, KETONESUR, PROTEINUR, UROBILINOGEN, NITRITE, LEUKOCYTESUR  Radiological Exams on Admission: No results found.  EKG:  Ordered  Assessment/Plan Active Problems:   Chronic diastolic HF (heart failure) (HCC)   HTN (hypertension)   PAD (peripheral artery disease) (HCC)   CHF (congestive heart failure) (HCC)   Recurrent Bilateral pleural effusions secondary to acute exacerbation of chronic HFpEF s/p post left thoracentesis on 02/23- 1500 ml out and right thoracentesis on 2/26 w/ 1 litre out, fluid study indicating transudative fluid. Cytology of the pleural fluid came back negative for malignancy. Echo on 10/01/2019 showed LVEF between 55-60% with restrictive LV filling pattern, elevated LA pressure and borderline concentric left ventricular hypertrophy.  Repeat echo to/27 shows similar finding and per Cardiology indicating of significant diastolic dysfunction.  Cardiology advised cardiac catheterization for hemodynamics. .  Lasix was increased to 80 mg twice daily, Aldactone to 25 daily with potassium chloride. Cardiology in [place] discussed with Dr. Jeffie Pollock and Aundra Dubin and arranged trasnfer to [facility] for right and left heart catheterization and further management of of her CHF/recurrent bilateral pleural effusion.Consider pulmonary evaluation after cardio workup.  Chronic respiratory failure with hypoxia secondary to COPD, no s/s of acute exacerbation, her symptoms mainly due to recurrent B/L pleural effusion. Outpatient pulmonary follow-up with [facility] for lung function test. There was a plan for patient to transfer to [facility] for further management,  but no bed available so far.  Abdominal pain/nausea/vomiting likely secondary to question of gastric ulcer vs liver lesion?  CT abdomen shows liver lesion-1.5 cm ill-defined enhancement on admission.Needs MRI liver with and without contrast however given her elevated creatinine/low GFR MRI with contrast has been on hold.  Continue symptomatic management-Protonix, Zofran/Phenergan..  Pneumonia:Patient's chest x-ray to/25 in CT abdomen 2/26 showed possible  left-sided infiltrates on the left lingula and the left lower lobe, patient also had mild fever 2/25 and w/ positive procalcitonin 0.30-->0.15 (2/28), Cont  Ceftriaxone/Azithromycin (started 2/25)-continue to complete 5 days course.  Resistant HTN and s/p Hypertensive urgency:Uncontrolled on admission.  amlodipine to 10 mg, on home hydralazine 100 mg p.o. Q.8 hours, Imdur, labetalol 300 mg q.12 hours, Aldactone-Aldactone increased today 2/28. lisinopril has been on hold due to AKI.Blood pressure overall has improved.  Acute kidney injury:Creat trended up to 1.5, now down to baseline 1.2.  Continue diuretics and monitor.  Hyperglycemia secondary to type 2 diabetes mellitus: HBA1c stable 7.1 on 2/9  Blood sugar is well controlled, Continue insulin sliding scale and hypoglycemic protocol, cut down her Lantus for now due to kidney function fluctuation.  Anemia appears chronic 8-9 g. Slightly macrocytic MCV 79-80.  Patient reports he had colonoscopy 10 years ago and was normal.  Her ferritin level on 2/8 was normal at 48. Monitor hemoglobin.  Calcified uterine fibroid:CT abdomen and pelvis with contrast showed a calcified uterine fibroid. 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  She is on Plavix and Lipitor.  Depression and anxiety:Continue home bupropion, fluoxetine, buspirone and xanax   DVT prophylaxis: Heparin subQ Code Status: Full Family Communication: None at bedside Disposition Plan: Complicated patient, needs more than 2 nights of hospital stay Consults called: Cardiology PA is seeing the patient Admission status: PCU   Lequita Halt MD [facility] Pager [phone]    10/21/2019, 6:04 PM

## 2019-10-21 NOTE — Progress Notes (Signed)
Patient arrived to unit via [facility]. Current vitals are as follows:   10/21/19 1520  Vitals  Temp 98.3 F (36.8 C)  Temp Source Oral  BP (!) 160/71  MAP (mmHg) 98  BP Location Right Arm  BP Method Automatic  Patient Position (if appropriate) Lying  Pulse Rate 69  Pulse Rate Source Monitor  Resp 18  Oxygen Therapy  SpO2 98 %  O2 Device Nasal Cannula  O2 Flow Rate (L/min) 3 L/min  Height and Weight  Height 5\' 3"  (1.6 m)  Weight 70.1 kg  Type of Scale Used Bed  Type of Weight Actual  BSA (Calculated - sq m) 1.77 sq meters  BMI (Calculated) 27.38  Weight in (lb) to have BMI = 25 140.8  MEWS Score  MEWS Temp 0  MEWS Systolic 0  MEWS Pulse 0  MEWS RR 0  MEWS LOC 0  MEWS Score 0  MEWS Score Color Green   Patient is alert and oriented X4, denies any pain, shortness of breath, or dizziness. Telemetry leads applied and verified with second verifier. Dorene Ar, NP paged requesting orders for patient.

## 2019-10-22 ENCOUNTER — Inpatient Hospital Stay (HOSPITAL_COMMUNITY): Payer: Medicaid Other

## 2019-10-22 ENCOUNTER — Inpatient Hospital Stay: Payer: Self-pay

## 2019-10-22 ENCOUNTER — Encounter (HOSPITAL_COMMUNITY): Payer: Self-pay | Admitting: Internal Medicine

## 2019-10-22 ENCOUNTER — Other Ambulatory Visit: Payer: Self-pay

## 2019-10-22 DIAGNOSIS — I5032 Chronic diastolic (congestive) heart failure: Secondary | ICD-10-CM

## 2019-10-22 LAB — TROPONIN I (HIGH SENSITIVITY)
Troponin I (High Sensitivity): 27 ng/L — ABNORMAL HIGH (ref <18)
Troponin I (High Sensitivity): 22 ng/L — ABNORMAL HIGH (ref <18)

## 2019-10-22 LAB — FIBRINOGEN: Fibrinogen: 576 mg/dL — ABNORMAL HIGH (ref 210–475)

## 2019-10-22 LAB — BASIC METABOLIC PANEL
Anion gap: 13 (ref 5–15)
BUN: 28 mg/dL — ABNORMAL HIGH (ref 6–20)
CO2: 22 mmol/L (ref 22–32)
Calcium: 8.3 mg/dL — ABNORMAL LOW (ref 8.9–10.3)
Chloride: 104 mmol/L (ref 98–111)
Creatinine, Ser: 1.22 mg/dL — ABNORMAL HIGH (ref 0.44–1.00)
GFR calc Af Amer: 56 mL/min — ABNORMAL LOW (ref >60)
GFR calc non Af Amer: 48 mL/min — ABNORMAL LOW (ref >60)
Glucose, Bld: 147 mg/dL — ABNORMAL HIGH (ref 70–99)
Potassium: 3.7 mmol/L (ref 3.5–5.1)
Sodium: 139 mmol/L (ref 135–145)

## 2019-10-22 LAB — CBC
HCT: 27.5 % — ABNORMAL LOW (ref 36.0–46.0)
Hemoglobin: 8.5 g/dL — ABNORMAL LOW (ref 12.0–15.0)
MCH: 25.7 pg — ABNORMAL LOW (ref 26.0–34.0)
MCHC: 30.9 g/dL (ref 30.0–36.0)
MCV: 83.1 fL (ref 80.0–100.0)
Platelets: 179 10*3/uL (ref 150–400)
RBC: 3.31 MIL/uL — ABNORMAL LOW (ref 3.87–5.11)
RDW: 14.1 % (ref 11.5–15.5)
WBC: 6.2 10*3/uL (ref 4.0–10.5)
nRBC: 0 % (ref 0.0–0.2)

## 2019-10-22 LAB — POCT I-STAT 7, (LYTES, BLD GAS, ICA,H+H)
Acid-Base Excess: 6 mmol/L — ABNORMAL HIGH (ref 0.0–2.0)
Bicarbonate: 29.2 mmol/L — ABNORMAL HIGH (ref 20.0–28.0)
Calcium, Ion: 1.17 mmol/L (ref 1.15–1.40)
HCT: 26 % — ABNORMAL LOW (ref 36.0–46.0)
Hemoglobin: 8.8 g/dL — ABNORMAL LOW (ref 12.0–15.0)
O2 Saturation: 96 %
Patient temperature: 98.9
Potassium: 3.5 mmol/L (ref 3.5–5.1)
Sodium: 138 mmol/L (ref 135–145)
TCO2: 30 mmol/L (ref 22–32)
pCO2 arterial: 37.8 mmHg (ref 32.0–48.0)
pH, Arterial: 7.496 — ABNORMAL HIGH (ref 7.350–7.450)
pO2, Arterial: 75 mmHg — ABNORMAL LOW (ref 83.0–108.0)

## 2019-10-22 LAB — MRSA PCR SCREENING: MRSA by PCR: NEGATIVE

## 2019-10-22 LAB — HEPATITIS B SURFACE ANTIGEN: Hepatitis B Surface Ag: NONREACTIVE

## 2019-10-22 LAB — GLUCOSE, CAPILLARY
Glucose-Capillary: 147 mg/dL — ABNORMAL HIGH (ref 70–99)
Glucose-Capillary: 160 mg/dL — ABNORMAL HIGH (ref 70–99)
Glucose-Capillary: 152 mg/dL — ABNORMAL HIGH (ref 70–99)
Glucose-Capillary: 275 mg/dL — ABNORMAL HIGH (ref 70–99)

## 2019-10-22 LAB — HIV ANTIBODY (ROUTINE TESTING W REFLEX): HIV Screen 4th Generation wRfx: NONREACTIVE

## 2019-10-22 LAB — PROCALCITONIN: Procalcitonin: 0.22 ng/mL

## 2019-10-22 LAB — C-REACTIVE PROTEIN: CRP: 6 mg/dL — ABNORMAL HIGH (ref <1.0)

## 2019-10-22 LAB — SARS CORONAVIRUS 2 (TAT 6-24 HRS): SARS Coronavirus 2: POSITIVE — AB

## 2019-10-22 LAB — LACTIC ACID, PLASMA: Lactic Acid, Venous: 1.2 mmol/L (ref 0.5–1.9)

## 2019-10-22 LAB — MAGNESIUM: Magnesium: 1.5 mg/dL — ABNORMAL LOW (ref 1.7–2.4)

## 2019-10-22 LAB — D-DIMER, QUANTITATIVE: D-Dimer, Quant: 3.75 ug/mL-FEU — ABNORMAL HIGH (ref 0.00–0.50)

## 2019-10-22 LAB — FERRITIN: Ferritin: 177 ng/mL (ref 11–307)

## 2019-10-22 LAB — ABO/RH: ABO/RH(D): B POS

## 2019-10-22 LAB — LACTATE DEHYDROGENASE: LDH: 260 U/L — ABNORMAL HIGH (ref 98–192)

## 2019-10-22 MED ORDER — SODIUM CHLORIDE 0.9 % IV SOLN
200.0000 mg | Freq: Once | INTRAVENOUS | Status: AC
  Administered 2019-10-22: 200 mg via INTRAVENOUS
  Filled 2019-10-22: qty 40

## 2019-10-22 MED ORDER — MORPHINE SULFATE (PF) 2 MG/ML IV SOLN
2.0000 mg | Freq: Once | INTRAVENOUS | Status: AC
  Administered 2019-10-22: 2 mg via INTRAVENOUS
  Filled 2019-10-22: qty 1

## 2019-10-22 MED ORDER — ADULT MULTIVITAMIN W/MINERALS CH
1.0000 | ORAL_TABLET | Freq: Every day | ORAL | Status: DC
  Administered 2019-10-22 – 2019-11-06 (×15): 1 via ORAL
  Filled 2019-10-22 (×15): qty 1

## 2019-10-22 MED ORDER — PANTOPRAZOLE SODIUM 40 MG IV SOLR
40.0000 mg | Freq: Two times a day (BID) | INTRAVENOUS | Status: DC
  Administered 2019-10-22 – 2019-10-24 (×5): 40 mg via INTRAVENOUS
  Filled 2019-10-22 (×5): qty 40

## 2019-10-22 MED ORDER — CHLORHEXIDINE GLUCONATE CLOTH 2 % EX PADS
6.0000 | MEDICATED_PAD | Freq: Every day | CUTANEOUS | Status: DC
  Administered 2019-10-22 – 2019-11-06 (×16): 6 via TOPICAL

## 2019-10-22 MED ORDER — SODIUM CHLORIDE 0.9 % IV SOLN
8.0000 mg | Freq: Once | INTRAVENOUS | Status: DC
  Filled 2019-10-22: qty 4

## 2019-10-22 MED ORDER — MORPHINE SULFATE (PF) 2 MG/ML IV SOLN
2.0000 mg | INTRAVENOUS | Status: DC | PRN
  Administered 2019-10-22 – 2019-10-24 (×2): 2 mg via INTRAVENOUS
  Filled 2019-10-22 (×3): qty 1

## 2019-10-22 MED ORDER — ALBUTEROL SULFATE HFA 108 (90 BASE) MCG/ACT IN AERS
1.0000 | INHALATION_SPRAY | RESPIRATORY_TRACT | Status: DC | PRN
  Administered 2019-10-22: 2 via RESPIRATORY_TRACT
  Filled 2019-10-22: qty 6.7

## 2019-10-22 MED ORDER — ENOXAPARIN SODIUM 40 MG/0.4ML ~~LOC~~ SOLN
40.0000 mg | Freq: Two times a day (BID) | SUBCUTANEOUS | Status: DC
  Administered 2019-10-22 – 2019-10-25 (×6): 40 mg via SUBCUTANEOUS
  Filled 2019-10-22 (×6): qty 0.4

## 2019-10-22 MED ORDER — LABETALOL HCL 5 MG/ML IV SOLN
10.0000 mg | Freq: Once | INTRAVENOUS | Status: AC
  Administered 2019-10-22: 10 mg via INTRAVENOUS
  Filled 2019-10-22: qty 4

## 2019-10-22 MED ORDER — METHYLPREDNISOLONE SODIUM SUCC 125 MG IJ SOLR
60.0000 mg | INTRAMUSCULAR | Status: DC
  Administered 2019-10-22: 60 mg via INTRAVENOUS
  Filled 2019-10-22: qty 2

## 2019-10-22 MED ORDER — CLEVIDIPINE BUTYRATE 0.5 MG/ML IV EMUL
0.0000 mg/h | INTRAVENOUS | Status: DC
  Administered 2019-10-22: 1 mg/h via INTRAVENOUS
  Administered 2019-10-22: 4 mg/h via INTRAVENOUS
  Administered 2019-10-23: 1 mg/h via INTRAVENOUS
  Filled 2019-10-22 (×5): qty 50

## 2019-10-22 MED ORDER — WHITE PETROLATUM EX OINT
TOPICAL_OINTMENT | CUTANEOUS | Status: AC
  Filled 2019-10-22: qty 28.35

## 2019-10-22 MED ORDER — LABETALOL HCL 5 MG/ML IV SOLN
10.0000 mg | Freq: Once | INTRAVENOUS | Status: AC
  Administered 2019-10-22: 10 mg via INTRAVENOUS

## 2019-10-22 MED ORDER — SODIUM CHLORIDE 0.9 % IV SOLN
100.0000 mg | Freq: Every day | INTRAVENOUS | Status: AC
  Administered 2019-10-23 – 2019-10-26 (×4): 100 mg via INTRAVENOUS
  Filled 2019-10-22 (×5): qty 20

## 2019-10-22 MED ORDER — THIAMINE HCL 100 MG PO TABS
100.0000 mg | ORAL_TABLET | Freq: Every day | ORAL | Status: DC
  Administered 2019-10-22 – 2019-11-06 (×15): 100 mg via ORAL
  Filled 2019-10-22 (×15): qty 1

## 2019-10-22 MED ORDER — LABETALOL HCL 5 MG/ML IV SOLN
INTRAVENOUS | Status: AC
  Filled 2019-10-22: qty 4

## 2019-10-22 MED ORDER — FOLIC ACID 1 MG PO TABS
1.0000 mg | ORAL_TABLET | Freq: Every day | ORAL | Status: DC
  Administered 2019-10-23 – 2019-11-06 (×14): 1 mg via ORAL
  Filled 2019-10-22 (×15): qty 1

## 2019-10-22 MED ORDER — ISOSORBIDE MONONITRATE ER 60 MG PO TB24
120.0000 mg | ORAL_TABLET | Freq: Every day | ORAL | Status: DC
  Administered 2019-10-22: 120 mg via ORAL
  Filled 2019-10-22: qty 2

## 2019-10-22 MED ORDER — HYDRALAZINE HCL 20 MG/ML IJ SOLN
10.0000 mg | Freq: Four times a day (QID) | INTRAMUSCULAR | Status: DC | PRN
  Administered 2019-10-22: 10 mg via INTRAVENOUS
  Filled 2019-10-22 (×2): qty 1

## 2019-10-22 MED ORDER — NITROGLYCERIN 2 % TD OINT
1.0000 [in_us] | TOPICAL_OINTMENT | Freq: Four times a day (QID) | TRANSDERMAL | Status: DC
  Administered 2019-10-22 – 2019-10-24 (×7): 1 [in_us] via TOPICAL
  Filled 2019-10-22 (×2): qty 30

## 2019-10-22 MED ORDER — IOHEXOL 300 MG/ML  SOLN
80.0000 mL | Freq: Once | INTRAMUSCULAR | Status: AC | PRN
  Administered 2019-10-22: 80 mL via INTRAVENOUS

## 2019-10-22 MED ORDER — MAGNESIUM SULFATE 2 GM/50ML IV SOLN
2.0000 g | Freq: Once | INTRAVENOUS | Status: AC
  Administered 2019-10-22: 2 g via INTRAVENOUS
  Filled 2019-10-22: qty 50

## 2019-10-22 MED ORDER — LABETALOL HCL 200 MG PO TABS
300.0000 mg | ORAL_TABLET | Freq: Two times a day (BID) | ORAL | Status: DC
  Administered 2019-10-22 – 2019-10-23 (×4): 300 mg via ORAL
  Filled 2019-10-22 (×4): qty 1

## 2019-10-22 MED ORDER — NITROGLYCERIN IN D5W 200-5 MCG/ML-% IV SOLN
0.0000 ug/min | INTRAVENOUS | Status: DC
  Administered 2019-10-22: 5 ug/min via INTRAVENOUS
  Filled 2019-10-22: qty 250

## 2019-10-22 MED ORDER — MORPHINE SULFATE (PF) 2 MG/ML IV SOLN
2.0000 mg | Freq: Once | INTRAVENOUS | Status: AC
  Administered 2019-10-22: 2 mg via INTRAVENOUS

## 2019-10-22 NOTE — Progress Notes (Signed)
Pt was taken to CT on NRM 100%. Could not lay flat (pillows used). States cannot do MRI due to afraid of being in closed spaces.  Returned to room. VS stable. Will continue to monitor

## 2019-10-22 NOTE — Progress Notes (Signed)
Md notified of pt bp 193/81  per 3w RN pt was unable to take her oral 2200 med's  Pt has also stated to me that she can't take anything oral or she will be sick  Iv labetalol ordered

## 2019-10-22 NOTE — Progress Notes (Signed)
PT Cancellation Note  Patient Details Name: Jamie [name] MRN: [id number] DOB: 01-Sep-1960   Cancelled Treatment:    Reason Eval/Treat Not Completed: Medical issues which prohibited therapy; patient reports gets severely SOB rolling in bed and currently on HFNC @ 10LPM.  Also on the phone with family.  Will attempt another day.   Reginia Naas 10/22/2019, 4:29 PM  Magda Kiel, [place] [phone] 10/22/2019

## 2019-10-22 NOTE — Progress Notes (Signed)
Patient ID: Jamie [name], female   DOB: 1961-07-13, 59 y.o.   MRN: [id number]     Advanced Heart Failure Rounding Note  PCP-Cardiologist: No primary care provider on file.   Subjective:    Patient tested positive for COVID-19.  She is on HF Simpson still, FiO2 0.5.  Has been refusing various meds.  Still with abdominal pain. BP running high, now on clevidipine gtt.   Creatinine stable 1.22, afebrile.    Objective:   Weight Range: 74.3 kg Body mass index is 29.02 kg/m.   Vital Signs:   Temp:  [97.6 F (36.4 C)-99.3 F (37.4 C)] 99.2 F (37.3 C) (03/01 1600) Pulse Rate:  [70-78] 76 (03/01 1730) Resp:  [13-21] 18 (03/01 1730) BP: (124-193)/(43-130) 158/88 (03/01 1730) SpO2:  [96 %-100 %] 99 % (03/01 1730) FiO2 (%):  [50 %] 50 % (03/01 1403) Weight:  [74.3 kg] 74.3 kg (03/01 0411) Last BM Date: 10/21/19  Weight change: Filed Weights   10/21/19 1520 10/22/19 0411  Weight: 70.1 kg 74.3 kg    Intake/Output:   Intake/Output Summary (Last 24 hours) at 10/22/2019 1753 Last data filed at 10/22/2019 1400 Gross per 24 hour  Intake 392.75 ml  Output 700 ml  Net -307.25 ml      Physical Exam    General:  Well appearing. No resp difficulty HEENT: Normal Neck: Supple. JVP 12 cm. Carotids 2+ bilat; no bruits. No lymphadenopathy or thyromegaly appreciated. Cor: PMI nondisplaced. Regular rate & rhythm. No rubs, gallops or murmurs. Lungs: Crackles bilaterally.  Abdomen: Soft, mild diffuse tenderness, nondistended. No hepatosplenomegaly. No bruits or masses. Good bowel sounds. Extremities: No cyanosis, clubbing, rash, edema Neuro: Alert & orientedx3, cranial nerves grossly intact. moves all 4 extremities w/o difficulty. Affect pleasant   Telemetry   NSR 70s, personally reviewed  Labs    CBC Recent Labs    10/21/19 1957 10/21/19 1957 10/22/19 0300 10/22/19 1152  WBC 8.0  --  6.2  --   HGB 8.9*   < > 8.5* 8.8*  HCT 27.8*   < > 27.5* 26.0*  MCV 80.8  --  83.1  --   PLT 176   --  179  --    < > = values in this interval not displayed.   Basic Metabolic Panel Recent Labs    10/21/19 1957 10/21/19 1957 10/22/19 0300 10/22/19 1152  NA 137   < > 139 138  K 3.7   < > 3.7 3.5  CL 102  --  104  --   CO2 25  --  22  --   GLUCOSE 214*  --  147*  --   BUN 33*  --  28*  --   CREATININE 1.22*  --  1.22*  --   CALCIUM 8.3*  --  8.3*  --   MG  --   --  1.5*  --    < > = values in this interval not displayed.   Liver Function Tests Recent Labs    10/21/19 1957  AST 18  ALT 17  ALKPHOS 59  BILITOT 0.8  PROT 5.9*  ALBUMIN 2.6*   No results for input(s): LIPASE, AMYLASE in the last 72 hours. Cardiac Enzymes No results for input(s): CKTOTAL, CKMB, CKMBINDEX, TROPONINI in the last 72 hours.  BNP: BNP (last 3 results) Recent Labs    10/21/19 1957  BNP 1,040.2*    ProBNP (last 3 results) No results for input(s): PROBNP in the last 8760 hours.  D-Dimer Recent Labs    10/22/19 1019  DDIMER 3.75*   Hemoglobin A1C No results for input(s): HGBA1C in the last 72 hours. Fasting Lipid Panel No results for input(s): CHOL, HDL, LDLCALC, TRIG, CHOLHDL, LDLDIRECT in the last 72 hours. Thyroid Function Tests No results for input(s): TSH, T4TOTAL, T3FREE, THYROIDAB in the last 72 hours.  Invalid input(s): FREET3  Other results:   Imaging    CT ABDOMEN PELVIS W CONTRAST  Result Date: 10/22/2019 CLINICAL DATA:  Diffuse abdominal pain and anemia. EXAM: CT ABDOMEN AND PELVIS WITH CONTRAST TECHNIQUE: Multidetector CT imaging of the abdomen and pelvis was performed using the standard protocol following bolus administration of intravenous contrast. CONTRAST:  65mL OMNIPAQUE IOHEXOL 300 MG/ML  SOLN COMPARISON:  CT scan 10/19/2019 FINDINGS: Lower chest: Persistent moderate to large bilateral pleural effusions with significant overlying atelectasis. The heart is normal in size. No pericardial effusion. Age advanced coronary artery calcifications are noted.  Hepatobiliary: No focal hepatic lesions or intrahepatic biliary dilatation. Ill-defined area of contrast enhancement and segment 7 likely a vascular shunt. The gallbladder is mildly contracted. No common bile duct dilatation. Pancreas: Scattered calcifications or likely due to prior inflammation. No worrisome pancreatic lesions or acute inflammatory process. No ductal dilatation. Spleen: Normal size. No focal lesions. Adrenals/Urinary Tract: The adrenal glands and kidneys are unremarkable. No worrisome renal lesions or hydronephrosis. The bladder appears normal. Stomach/Bowel: The stomach, duodenum, small bowel and colon are grossly normal without oral contrast. No acute inflammatory changes, mass lesions or obstructive findings. Vascular/Lymphatic: Severe/advanced atherosclerotic calcifications involving the aorta and branch vessels for age. No aneurysm or dissection. The major venous structures are patent. No mesenteric or retroperitoneal mass or adenopathy. Reproductive: The uterus and ovaries are unremarkable. Calcified fibroid again noted posteriorly. Other: Small to moderate amount of free pelvic fluid is again demonstrated. No obvious cause. Musculoskeletal: Stable compression fracture of L1. No worrisome bone lesions. Age advanced osteoporosis. IMPRESSION: 1. Persistent moderate to large bilateral pleural effusions with overlying atelectasis. 2. No acute abdominal/pelvic findings, mass lesions or adenopathy. 3. Severe/advanced atherosclerotic calcifications involving the aorta and branch vessels for age. 4. Small to moderate amount of persistent free pelvic fluid. No obvious cause. 5. Calcified uterine fibroids. Aortic Atherosclerosis (ICD10-I70.0). Electronically Signed   By: Marijo Sanes M.D.   On: 10/22/2019 13:56   DG Chest Port 1 View  Result Date: 10/22/2019 CLINICAL DATA:  CHF respiratory distress.  COVID-19 positive EXAM: PORTABLE CHEST 1 VIEW COMPARISON:  10/20/2019 FINDINGS: Diffuse bilateral  airspace disease with basilar predominance shows mild progression. Moderate left effusion has improved in the interval. Probable small right effusion. IMPRESSION: Mild progression of diffuse bilateral airspace disease which may represent edema or pneumonia. Improvement in left pleural effusion. Electronically Signed   By: Franchot Gallo M.D.   On: 10/22/2019 09:28   [place] EKG SITE RITE  Result Date: 10/22/2019 If Site Rite image not attached, placement could not be confirmed due to current cardiac rhythm.  US Abdomen Limited RUQ  Result Date: 10/22/2019 CLINICAL DATA:  Acute right upper quadrant abdominal pain. EXAM: ULTRASOUND ABDOMEN LIMITED RIGHT UPPER QUADRANT COMPARISON:  October 19, 2019. FINDINGS: Gallbladder: No gallstones or wall thickening visualized. No sonographic Murphy sign noted by sonographer. Common bile duct: Diameter: 5 mm which is within normal limits. Liver: No focal lesion identified. Within normal limits in parenchymal echogenicity. Portal vein is patent on color Doppler imaging with normal direction of blood flow towards the liver. Other: Minimal ascites is noted. IMPRESSION: Minimal ascites.  No other abnormality seen in the right upper quadrant of the abdomen. Electronically Signed   By: Marijo Conception M.D.   On: 10/22/2019 09:03      Medications:     Scheduled Medications: . amLODipine  10 mg Oral Daily  . aspirin EC  81 mg Oral Daily  . atorvastatin  80 mg Oral QHS  . busPIRone  5 mg Oral Daily  . Chlorhexidine Gluconate Cloth  6 each Topical Daily  . enoxaparin (LOVENOX) injection  40 mg Subcutaneous BID  . FLUoxetine  40 mg Oral Daily  . folic acid  1 mg Oral Daily  . furosemide  80 mg Intravenous BID  . hydrALAZINE  100 mg Oral Q8H  . insulin aspart  0-15 Units Subcutaneous TID WC  . insulin glargine  20 Units Subcutaneous QHS  . labetalol      . labetalol  300 mg Oral BID  . methylPREDNISolone (SOLU-MEDROL) injection  60 mg Intravenous Q24H  . multivitamin  with minerals  1 tablet Oral Daily  . nitroGLYCERIN  1 inch Topical Q6H  . pantoprazole (PROTONIX) IV  40 mg Intravenous Q12H  . thiamine  100 mg Oral Daily     Infusions: . clevidipine 2 mg/hr (10/22/19 1400)  . nitroGLYCERIN Stopped (10/22/19 1117)  . ondansetron (ZOFRAN) IV    . remdesivir 200 mg in sodium chloride 0.9% 250 mL IVPB     Followed by  . [START ON 10/23/2019] remdesivir 100 mg in NS 100 mL       PRN Medications:  acetaminophen, albuterol, ALPRAZolam, hydrALAZINE, HYDROcodone-acetaminophen, ipratropium-albuterol, morphine injection, nitroGLYCERIN, ondansetron (ZOFRAN) IV   Assessment/Plan   1. Acute hypoxemic respiratory failure: COVID-19 viral pneumonia is likely the main driver for this.  Currently on FiO2 0.5 HFNC.  I do suspect that there is CHF present as well.  - Started on steroids and remdesivir for COVID-19.  2. Acute on chronic diastolic CHF: Last echo at [facility] 2/21 with EF 55-60%, restrictive diastolic function.  Creatinine 1.22 today.  Multiple admissions with HTN and CHF exacerbation.  She looks volume overloaded still on exam, think this is contributing as well as COVID-19 to her respiratory failure. She has not had much urine output so far though her creatinine remains stable.  - Would like PICC line placement to follow CVP for better gauging of volume status.  She has refused tonight, will see if we can get it done in am.  - Lasix 80 mg IV bid for now. Follow creatinine closely.  - May eventually benefit from Cardiomems placement.  - Plan for eventual RHC/LHC, timing unclear.  3. Pleural effusions: Bilateral.  Had thoracenteses this admission at [facility]. Transudates with negative cytology. Suspect due to CHF.  CT done today showed persistent moderate-large effusions.  4. HTN: Poorly controlled, admitted with hypertensive emergency/CHF. She has been refusing medications.  - Started on clevidipine gtt as hard to control BP when she refuses oral meds.   Titrate for SBP < 150 for now.  - Continue amlodipine 10 mg daily.  - Continue hydralazine 100 mg tid. - Continue labetalol 300 bid - Hold off on ACEI/ARB with recent AKI.  5. AKI: Creatinine initially Sutphin this admission but creatinine back down to 1.22 today, follow closely.  6. Abdominal pain: CT abdomen earlier in admission with 1.5 cm liver lesion but no other significant abdominal abnormality.  She has had abdominal pain throughout this admission.  CT abdomen today showed no acute abdominal findings.  7.  COPD: Per report on 3L home oxygen.  8. DM: SSI, per [facility].   Length of Stay: 1  Loralie Champagne, MD  10/22/2019, 5:53 PM  Advanced Heart Failure Team Pager [phone] (M-F; 7a - 4p)  Please contact [facility] for night-coverage after hours (4p -7a ) and weekends on [email or web address]

## 2019-10-22 NOTE — Consult Note (Addendum)
NAME:  Jamie [name], MRN:  [id number], DOB:  [birth year], LOS: 1 ADMISSION DATE:  10/21/2019, CONSULTATION DATE:  10/22/19 REFERRING MD:  Dr. Candiss Norse, CHIEF COMPLAINT:  SOB   Brief History   59 y/o F who initially presented 2/28 to [facility] from SNF with reports of SOB, chest pain and abdominal pain.    History of present illness   59 y/o F who presented to [facility] on 2/28 with reports of shortness of breath, chest pain and abdominal pain.  She reports she has been in a skilled nursing facility for approximately 1 month due to frequent hospitalizations for heart failure.  In 07/2019 she was admitted to [facility] with an ECHO at that time that showed an LVEF of 55-60%, moderate concentric hypertrophy, mild anteroseptal hypokinesis and mild inferoseptal hypokinesis.  She was admitted from 1/30 - 09/25/19 and again from 2/8-2/13 for CHF exacerbations.  Has had over 5 thoracentesis performed in the last 10 months which have been reportedly transudative (thoracentesis 08/14/20 with 1550 ml removed --pl fluid 1.0 albumin, serum albumin 3.2, pleural LDH 168, serum 778, pleural to serum protein ration < 0.5 and pleural to serum LDH issue <0.6).  She reports as much is 2 L being removed off 1 side at a time.  Repeat ECHO 10/01/2019 shows an LVEF of 55 to 60% with restrictive LV filling pattern consistent with elevated LA pressure and borderline concentric LVH.    She was readmitted 2/22 to [facility] with complaints of shortness of breath and hypertensive crisis with initial BP of 209/99.  She was Covid negative at that time.  CT of the abdomen / pelvis showed large bilateral pleural effusions with adjacent atelectasis.  Was treated with IV Lasix, Vasotec and nitroglycerin drip.  The CT of the abdomen also showed additional 1.5 cm ill-defined liver lesion with enhancement.  MRI was recommended but has not been completed due to rising renal function. ECHO 2/27 showed an LVEF of 55 to 60% with mild  concentric LVH, both mean arterial pressure and LVEDP elevated, mild MR, mild TR, trace PR and RV systolic pressure of 48 mmHg.  The patient was transferred to [facility] on 2/28 for further evaluation by the advanced heart failure team.  On presentation to [facility] she was found to be Covid positive.  Inflammatory markers are pending at time of dictation.  The patient was found on 3/1 am with respiratory distress, hypertensive to greater than 517 systolic with complaints of shortness of breath and chest pain.  O2 was increased to 10 L [place]ter.  She was treated with IV labetalol, nitro drip, and IV Lasix with 400 mL urinary output returned.   PCCM consulted for evaluation and transfer to ICU.  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  Unable to wear prosthesis due to lower extremity swelling. 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  No other abnormalities seen in the RUQ of the abdomen.  EKG 3/1 >>  Micro Data:  COVID 2/28 >> positive  HIV 3/1 >> negative  RVP 3/1 >>  Antimicrobials:    Interim history/subjective:  Patient reports shortness of breath has improved.  States chest pain improved.  Objective   Blood pressure (!) 189/79, pulse 78, temperature 97.6 F (36.4 C), temperature source Oral, resp. rate (!) 21, height 5\' 3"  (1.6 m), weight 74.3 kg, SpO2 97 %.        Intake/Output Summary (Last 24 hours) at 10/22/2019 0912 Last data filed at 10/22/2019 0400 Gross per 24 hour  Intake 240 ml  Output 550 ml  Net -310 ml   Filed Weights   10/21/19 1520 10/22/19 0411  Weight: 70.1 kg 74.3 kg    Examination: General: chronically ill  appearing adult female sitting up in bed in NAD HEENT: MM pink/moist, [place] O2, poor dentition Neuro: Awake, alert, oriented, speech clear, has periods of intermittent fatigue/drowsiness CV: s1s2 RRR, SR, no m/r/g PULM:  Non-labored on [place] O2, lungs bilaterally clear anterior, diminished bibasilar, few crackles  GI: soft, bsx4 active  Extremities: warm/dry, no edema, R BKA, no pitting edema. Skin: no rashes or lesions  Resolved Hospital Problem list     Assessment & Plan:   Acute Respiratory Distress in setting of suspected Flash Pulmonary Edema  Acute on Chronic Hypoxemic Respiratory Failure Baseline 3L O2 dependent.  -transfer to ICU  -will utilize vent for non-invasive positive pressure ventilation  -wean O2 for sats >88% -now CXR  Hypertensive Emergency  Chest Pain -control BP  -NTG started per TRH,  -assess EKG now -lasix now, then 80mg  BID   Acute on Chronic Diastolic CHF  LVEF 55 to 60% 2/27 -Advanced CHF service following, appreciate input  -now EKG, troponin -trend BNP  -strict I/O's -Cardiology planning for R/L heart cath   Bilateral Pleural Effusions Transudative in nature on past thoracentesis. -monitor for now -hold further thoracentesis at this time.  Follow CXR, if increasing consider repeat.  Otherwise treat underlying CHF  COVID Positive  Does not appear to be playing large role in respiratory decompensation, suspect this is related to hypertensive crisis with flash edema acutely.  CXR appears more like volume/effusions.  Prior to acute episode this am, was on baseline 3L -assess inflammatory labs, if elevated will treat with remdesevir, steroids  -prone positioning as able   AKI on CKD 3 -Trend BMP / urinary output -Replace electrolytes as indicated -Avoid nephrotoxic agents, ensure adequate renal perfusion  Hypomagnesemia  -replaced 3/1, follow up mag in am   DM  -SSI, moderate scale  -if steroids initiated, will need increased  coverage  Anemia -trend CBC  Abdominal Pain  Nausea Liver Mass -will need follow up imaging  -PRN zofran  Best practice:  Diet: NPO Pain/Anxiety/Delirium protocol (if indicated): n/a VAP protocol (if indicated): n/a DVT prophylaxis: heparin  GI prophylaxis: n/a Glucose control: SSI Mobility: BR  Code Status: Full Code  Family Communication: Patient updated on plan of care.  Disposition: ICU  Labs   CBC: Recent Labs  Lab 10/21/19 1957 10/22/19 0300  WBC 8.0 6.2  HGB 8.9* 8.5*  HCT 27.8* 27.5*  MCV 80.8 83.1  PLT 176 737    Basic Metabolic Panel: Recent Labs  Lab 10/21/19 1957 10/22/19 0300  NA 137 139  K 3.7 3.7  CL 102 104  CO2 25 22  GLUCOSE 214* 147*  BUN 33* 28*  CREATININE 1.22* 1.22*  CALCIUM 8.3* 8.3*  MG  --  1.5*   GFR: Estimated Creatinine Clearance: 48 mL/min (A) (by C-G formula based on SCr of 1.22 mg/dL (H)). Recent Labs  Lab 10/21/19 1957 10/21/19 2211 10/22/19  0300  PROCALCITON 0.20  --   --   WBC 8.0  --  6.2  LATICACIDVEN 0.8 1.1  --     Liver Function Tests: Recent Labs  Lab 10/21/19 1957  AST 18  ALT 17  ALKPHOS 59  BILITOT 0.8  PROT 5.9*  ALBUMIN 2.6*   No results for input(s): LIPASE, AMYLASE in the last 168 hours. No results for input(s): AMMONIA in the last 168 hours.  ABG No results found for: PHART, PCO2ART, PO2ART, HCO3, TCO2, ACIDBASEDEF, O2SAT   Coagulation Profile: Recent Labs  Lab 10/21/19 1957  INR 1.0    Cardiac Enzymes: No results for input(s): CKTOTAL, CKMB, CKMBINDEX, TROPONINI in the last 168 hours.  HbA1C: No results found for: HGBA1C  CBG: Recent Labs  Lab 10/21/19 1706 10/21/19 2116 10/22/19 0755  GLUCAP 205* 226* 147*    Review of Systems: Positives in [place]   Gen: Denies fever, chills, weight change, fatigue, night sweats HEENT: Denies blurred vision, double vision, hearing loss, tinnitus, sinus congestion, rhinorrhea, sore throat, neck stiffness, dysphagia PULM: Denies  shortness of breath, cough, sputum production, hemoptysis, wheezing CV: Denies chest pain, edema, orthopnea, paroxysmal nocturnal dyspnea, palpitations GI: Denies abdominal pain, nausea, vomiting, diarrhea, hematochezia, melena, constipation, change in bowel habits GU: Denies dysuria, hematuria, polyuria, oliguria, urethral discharge Endocrine: Denies hot or cold intolerance, polyuria, polyphagia or appetite change Derm: Denies rash, dry skin, scaling or peeling skin change Heme: Denies easy bruising, bleeding, bleeding gums Neuro: Denies headache, numbness, weakness, slurred speech, loss of memory or consciousness   Past Medical History  She,  has a past medical history of Amputation below knee (HCC) RT, Chronic diastolic HF (heart failure) ([place]) (10/21/2019), COPD (chronic obstructive pulmonary disease) ([place]), Diabetes type 2, uncontrolled ([place]), Gastritis and duodenitis, HTN (hypertension) (10/21/2019), PAD (peripheral artery disease) ([place]) (10/21/2019), PAD (peripheral artery disease) ([place]), PNA (pneumonia), and TIA (transient ischemic attack).   Surgical History    Past Surgical History:  Procedure Laterality Date  . BELOW KNEE LEG AMPUTATION Right      Social History   reports that she has never smoked. She has never used smokeless tobacco. She reports that she does not drink alcohol or use drugs.   Family History   Her family history includes Diabetes in her brother; Heart attack in her paternal grandmother; Hypertension in her brother and maternal grandmother.   Allergies Allergies  Allergen Reactions  . Codeine Other (See Comments)    headaches  . Meperidine And Related Other (See Comments)    headaches  . Tetracyclines & Related Other (See Comments)    Caused yeast infections     Home Medications  Prior to Admission medications   Medication Sig Start Date End Date Taking? Authorizing Provider  acetaminophen (TYLENOL) 325 MG tablet Take 650 mg by mouth every 6 (six)  hours as needed for headache (pain).   Yes [provider]  albuterol (VENTOLIN HFA) 108 (90 Base) MCG/ACT inhaler Inhale 2 puffs into the lungs every 4 (four) hours as needed for wheezing or shortness of breath.   Yes [provider]  ALPRAZolam Duanne Moron) 0.5 MG tablet Take 0.5 mg by mouth every 6 (six) hours as needed for anxiety.   Yes [provider]  amLODipine (NORVASC) 5 MG tablet Take 5 mg by mouth daily.   Yes [provider]  atorvastatin (LIPITOR) 80 MG tablet Take 80 mg by mouth at bedtime.   Yes [provider]  buPROPion (WELLBUTRIN) 100 MG tablet Take  100 mg by mouth 2 (two) times daily.   Yes [provider]  busPIRone (BUSPAR) 5 MG tablet Take 5 mg by mouth every morning.   Yes [provider]  Calcium Carbonate (CALCIUM-CARB 600 PO) Take 600 mg by mouth every 12 (twelve) hours as needed (acid indigestion).   Yes [provider]  Cholecalciferol (VITAMIN D3) 1.25 MG (50000 UT) TABS Take 50,000 Units by mouth every Tuesday.    Yes [provider]  clopidogrel (PLAVIX) 75 MG tablet Take 75 mg by mouth daily.   Yes [provider]  FLUoxetine (PROZAC) 40 MG capsule Take 40 mg by mouth every morning.   Yes [provider]  furosemide (LASIX) 20 MG tablet Take 60 mg by mouth 2 (two) times daily.   Yes [provider]  hydrALAZINE (APRESOLINE) 100 MG tablet Take 100 mg by mouth every 8 (eight) hours.   Yes [provider]  insulin aspart (NOVOLOG) 100 UNIT/ML injection Inject 10 Units into the skin 3 (three) times daily before meals.   Yes [provider]  Insulin Glargine (BASAGLAR KWIKPEN) 100 UNIT/ML SOPN Inject 40 Units into the skin at bedtime.   Yes [provider]  ipratropium-albuterol (DUONEB) 0.5-2.5 (3) MG/3ML SOLN Take 3 mLs by nebulization 4 (four) times daily.   Yes [provider]  isosorbide mononitrate (IMDUR) 120 MG 24 hr tablet  Take 120 mg by mouth at bedtime.   Yes [provider]  labetalol (NORMODYNE) 300 MG tablet Take 300 mg by mouth every 12 (twelve) hours.   Yes [provider]  lisinopril (ZESTRIL) 20 MG tablet Take 20 mg by mouth 2 (two) times daily.   Yes [provider]  loperamide (IMODIUM) 2 MG capsule Take 2 mg by mouth every 6 (six) hours as needed for diarrhea or loose stools.   Yes [provider]  Melatonin 5 MG TABS Take 5 mg by mouth at bedtime.   Yes [provider]  nitroGLYCERIN (NITROSTAT) 0.4 MG SL tablet Place 0.4 mg under the tongue every 5 (five) minutes as needed for chest pain.   Yes [provider]  ondansetron (ZOFRAN) 8 MG tablet Take 8 mg by mouth every 12 (twelve) hours as needed for nausea or vomiting.   Yes [provider]  pantoprazole (PROTONIX) 40 MG tablet Take 40 mg by mouth daily.   Yes [provider]  polyethylene glycol (MIRALAX / GLYCOLAX) 17 g packet Take 17 g by mouth daily as needed (constipation).   Yes [provider]  potassium chloride SA (KLOR-CON) 20 MEQ tablet Take 20 mEq by mouth daily.   Yes [provider]  spironolactone (ALDACTONE) 25 MG tablet Take 12.5 mg by mouth every morning.   Yes [provider]     Critical care time: 91 minutes     Noe Gens, MSN, NP-C [facility] 10/22/2019, 9:12 AM   Please see [email or web address] for pager details.

## 2019-10-22 NOTE — Progress Notes (Signed)
OT Cancellation Note  Patient Details Name: Jamie [name] MRN: [id number] DOB: [birth year]   Cancelled Treatment:    Reason Eval/Treat Not Completed: Other (comment)(Pt with decline in medical status. Pt transferred to ICU)  [name] 10/22/2019, 11:06 AM

## 2019-10-22 NOTE — Progress Notes (Signed)
Present to place PICC.  Explained the risk and benefits of PICC placement.  Patient refusing PICC at this time.  Informed her we would return if she changes her mine.  Please re enter the order if patient become agreeable.  Thanks

## 2019-10-22 NOTE — Progress Notes (Signed)
PROGRESS NOTE                                                                                                                                                                                                             Patient Demographics:    Jamie [name], is a 59 y.o. 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    She was recently hospitalized twice to [facility] for shortness of breath, her work-up showed bilateral transudative pleural effusion, she had thoracentesis suggesting transudative fluid with no malignant cells.  Reason for recurrent effusion was thought to be acute on chronic diastolic CHF.  She was also found to have liver mass.  She was finally transferred to [facility] for cardiology evaluation for recurrent CHF.   Subjective:    Jamie [name] today has, No headache, No chest pain, positive generalized abdominal pain and positive mild nausea, No new weakness tingling or numbness,  +++ SOB and cannot lean back or lay flat at all.   Assessment  & Plan :    Active Problems:   Chronic diastolic HF (heart failure) (HCC)   HTN (hypertension)   PAD (peripheral artery disease) (HCC)   CHF (congestive heart failure) (HCC)   Acute on chronic diastolic CHF (congestive heart failure) ([place])   1. Acute Hypoxic Resp. Failure due to Acute Covid 19 Viral Pneumonitis during the ongoing 2020 Covid 19 Pandemic - her main disease  process and hypoxia is due to #2 below which is acute on chronic diastolic CHF.  Her Covid disease itself seems to be mild to moderate for which I will place her on low-dose steroids and remdesivir.  Monitor closely.  Encouraged the patient to sit up in chair in the daytime use I-S and flutter valve for pulmonary toiletry and then prone in bed  when at night.  SpO2: 97 % O2 Flow Rate (L/min): 20 L/min FiO2 (%): 50 %  Recent Labs  Lab 10/21/19 1839 10/21/19 1957 11-05-19 1019  CRP  --   --  6.0*  DDIMER  --   --  3.75*  FERRITIN  --   --  177  BNP  --  1,040.2*  --   PROCALCITON  --  0.20 0.22  SARSCOV2NAA POSITIVE*  --   --     Hepatic Function Latest Ref Rng & Units 10/21/2019  Total Protein 6.5 - 8.1 g/dL 5.9(L)  Albumin 3.5 - 5.0 g/dL 2.6(L)  AST 15 - 41 U/L 18  ALT 0 - 44 U/L 17  Alk Phosphatase 38 - 126 U/L 59  Total Bilirubin 0.3 - 1.2 mg/dL 0.8    FiO2 (%):  [50 %] 50 %  ABG     Component Value Date/Time   PHART 7.496 (H) November 05, 2019 1152   PCO2ART 37.8 2019/11/05 1152   PO2ART 75.0 (L) 2019-11-05 1152   HCO3 29.2 (H) 11-05-2019 1152   TCO2 30 05-Nov-2019 1152   O2SAT 96.0 11-05-2019 1152     2.  Severe acute hypoxic respiratory failure due to recurrent acute on chronic diastolic CHF.  Recurrent bilateral pleural effusions which are transudate if worked up in [place], EF 60%.  She was in severe respiratory distress morning of 2019-11-05 and almost coded, he also had evidence of hypertensive urgency likely due to respiratory distress.  She has been placed on IV nitro drip, she was given 120 mg of IV Lasix, supplemental oxygen 12 L was applied, she also received IV labetalol and hydralazine.  Subsequently transferred to ICU and seen by [facility] team.  She might require intubation or positive pressure ventilation at least for the short-term till her CHF results.  Cardiology is already on board.  Overall condition quite tenuous.  3.  PAD.  S/p right BKA, CT evidence of  abdominal diffuse calcification with recurrent abdominal pain.  Highly questionable for ischemic bowel, for now continue aspirin and statin for secondary prevention, clear liquid diet and monitor.  She likely has diffuse atherosclerosis due to uncontrolled diabetes mellitus and hypertension.  Denies any history of smoking.  4.  Liver mass.  Needs abdominal MRI once pulmonary status is improved.  5.  GERD with nausea.  Also has HX of diabetic gastroparesis.  For now supportive care with clear liquid diet, IV PPI and nausea medications.   6.  Dyslipidemia.  Continue statin.  7.  Anemia of chronic disease.  Monitor.  8.  Hypertensive urgency.  Currently on nitro drip, as needed IV hydralazine added, continue oral labetalol and Norvasc.  Monitor and adjust.  9.  Depression and anxiety.  Continue combination of bupropion, fluoxetine, buspirone and Xanax.  10.  AKI at [facility] with creatinine of 1.5.  Improved, clearly currently requires diuretics, renal function might decline, will monitor if needed renal will be consulted.  For now she is in severe hypoxic respiratory failure and requires diuresis.  11. DM2 - lantus + ISS  No results found for: HGBA1C   CBG (last 3)  Recent Labs    10/21/19 2116 11-05-19 0755 Nov 05, 2019 1148  GLUCAP 226* 147* 160*     Condition - Extremely Guarded  Family Communication  :  Daughter in law RN on 2019-11-05 [phone]  Code Status :  Full  Diet :   Diet Order            Diet NPO  time specified Except for: Sips with Meds  Diet effective midnight               Disposition Plan  :  ICU -here with acute hypoxic respiratory failure requiring oxygen, IV nitro drip, IV Lasix.  Consults  : Pulmonary, cardiology  Procedures  :    CT - 1. Persistent moderate to large bilateral pleural effusions with overlying atelectasis. 2. No acute abdominal/pelvic findings, mass lesions or adenopathy. 3. Severe/advanced atherosclerotic calcifications involving  the aorta and branch vessels for age. 4. Small to moderate amount of persistent free pelvic fluid. No obvious cause. 5. Calcified uterine fibroids. Aortic Atherosclerosis.  TTE from [facility] - EF 60%, dCHF    PUD Prophylaxis : PPI  DVT Prophylaxis  :  Lovenox    Lab Results  Component Value Date   PLT 179 10/22/2019    Inpatient Medications  Scheduled Meds: . amLODipine  10 mg Oral Daily  . aspirin EC  81 mg Oral Daily  . atorvastatin  80 mg Oral QHS  . busPIRone  5 mg Oral Daily  . Chlorhexidine Gluconate Cloth  6 each Topical Daily  . enoxaparin (LOVENOX) injection  40 mg Subcutaneous BID  . FLUoxetine  40 mg Oral Daily  . folic acid  1 mg Oral Daily  . furosemide  80 mg Intravenous BID  . hydrALAZINE  100 mg Oral Q8H  . insulin aspart  0-15 Units Subcutaneous TID WC  . insulin glargine  20 Units Subcutaneous QHS  . labetalol      . labetalol  300 mg Oral BID  . multivitamin with minerals  1 tablet Oral Daily  . pantoprazole (PROTONIX) IV  40 mg Intravenous Q12H  . spironolactone  25 mg Oral Daily  . thiamine  100 mg Oral Daily   Continuous Infusions: . clevidipine 2 mg/hr (10/22/19 1400)  . nitroGLYCERIN Stopped (10/22/19 1117)  . ondansetron (ZOFRAN) IV     PRN Meds:.acetaminophen, albuterol, ALPRAZolam, hydrALAZINE, HYDROcodone-acetaminophen, ipratropium-albuterol, morphine injection, nitroGLYCERIN, ondansetron (ZOFRAN) IV  Antibiotics  :    Anti-infectives (From admission, onward)   Start     Dose/Rate Route Frequency Ordered Stop   10/21/19 2100  cefTRIAXone (ROCEPHIN) 1 g in sodium chloride 0.9 % 100 mL IVPB  Status:  Discontinued     1 g 200 mL/hr over 30 Minutes Intravenous Every 24 hours 10/21/19 1744 10/22/19 0748   10/21/19 2100  azithromycin (ZITHROMAX) tablet 500 mg  Status:  Discontinued     500 mg Oral Daily 10/21/19 1744 10/22/19 0748       Time Spent in minutes  30   Lala Lund M.D on 10/22/2019 at 3:02 PM  To page go to [email or web address] -  password [facility]  [facility] -  Office  [phone]     See all Orders from today for further details    Objective:   Vitals:   10/22/19 1330 10/22/19 1345 10/22/19 1403 10/22/19 1415  BP: (!) 150/130 (!) 150/69 (!) 153/68 (!) 154/58  Pulse:   72 73  Resp: 17 18 16 18   Temp:      TempSrc:      SpO2: 100% 96% 96% 97%  Weight:      Height:        Wt Readings from Last 3 Encounters:  10/22/19 74.3 kg     Intake/Output Summary (Last 24 hours) at 10/22/2019 1502 Last data filed at 10/22/2019 1400 Gross per 24 hour  Intake 392.75 ml  Output 700  ml  Net -307.25 ml     Physical Exam  Awake Alert, No new F.N deficits, in severe Resp distress [place].AT,PERRAL Supple Neck,No JVD, No cervical lymphadenopathy appriciated.  Symmetrical Chest wall movement, Good air movement bilaterally, +ve rales RRR,No Gallops,Rubs or new Murmurs, No Parasternal Heave +ve B.Sounds, Abd Soft, No tenderness, No organomegaly appriciated, No rebound - guarding or rigidity. No Cyanosis, R. BKA     Data Review:    CBC Recent Labs  Lab 10/21/19 1957 10/22/19 0300 10/22/19 1152  WBC 8.0 6.2  --   HGB 8.9* 8.5* 8.8*  HCT 27.8* 27.5* 26.0*  PLT 176 179  --   MCV 80.8 83.1  --   MCH 25.9* 25.7*  --   MCHC 32.0 30.9  --   RDW 14.0 14.1  --     Chemistries  Recent Labs  Lab 10/21/19 1957 10/22/19 0300 10/22/19 1152  NA 137 139 138  K 3.7 3.7 3.5  CL 102 104  --   CO2 25 22  --   GLUCOSE 214* 147*  --   BUN 33* 28*  --   CREATININE 1.22* 1.22*  --   CALCIUM 8.3* 8.3*  --   MG  --  1.5*  --   AST 18  --   --   ALT 17  --   --   ALKPHOS 59  --   --   BILITOT 0.8  --   --    ------------------------------------------------------------------------------------------------------------------ No results for input(s): CHOL, HDL, LDLCALC, TRIG, CHOLHDL, LDLDIRECT in the last 72 hours.  No results found for:  HGBA1C ------------------------------------------------------------------------------------------------------------------ No results for input(s): TSH, T4TOTAL, T3FREE, THYROIDAB in the last 72 hours.  Invalid input(s): FREET3  Cardiac Enzymes No results for input(s): CKMB, TROPONINI, MYOGLOBIN in the last 168 hours.  Invalid input(s): CK ------------------------------------------------------------------------------------------------------------------    Component Value Date/Time   BNP 1,040.2 (H) 10/21/2019 1957    Micro Results Recent Results (from the past 240 hour(s))  SARS CORONAVIRUS 2 (TAT 6-24 HRS) Nasopharyngeal Nasopharyngeal Swab     Status: Abnormal   Collection Time: 10/21/19  6:39 PM   Specimen: Nasopharyngeal Swab  Result Value Ref Range Status   SARS Coronavirus 2 POSITIVE (A) NEGATIVE Final    Comment: RESULT CALLED TO, READ BACK BY AND VERIFIED WITH: K. AJRDY,RN 0017 10/22/2019 T. TYSOR (NOTE) SARS-CoV-2 target nucleic acids are DETECTED. The SARS-CoV-2 RNA is generally detectable in upper and lower respiratory specimens during the acute phase of infection. Positive results are indicative of the presence of SARS-CoV-2 RNA. Clinical correlation with patient history and other diagnostic information is  necessary to determine patient infection status. Positive results do not rule out bacterial infection or co-infection with other viruses.  The expected result is Negative. Fact Sheet for Patients: SugarRoll.be Fact Sheet for Healthcare Providers: https://www.woods-mathews.com/ This test is not yet approved or cleared by the [place] FDA and  has been authorized for detection and/or diagnosis of SARS-CoV-2 by FDA under an Emergency Use Authorization (EUA). This EUA will remain  in effect (meaning this test can be used) for th e duration of the COVID-19 declaration under Section 564(b)(1) of the Act, 21 U.S.C. section  360bbb-3(b)(1), unless the authorization is terminated or revoked sooner. Performed at [facility], [place] [address], [place], [place] [address]     Radiology Reports CT ABDOMEN PELVIS W CONTRAST  Result Date: 10/22/2019 CLINICAL DATA:  Diffuse abdominal pain and anemia. EXAM: CT ABDOMEN AND PELVIS WITH CONTRAST  TECHNIQUE: Multidetector CT imaging of the abdomen and pelvis was performed using the standard protocol following bolus administration of intravenous contrast. CONTRAST:  18m OMNIPAQUE IOHEXOL 300 MG/ML  SOLN COMPARISON:  CT scan 10/19/2019 FINDINGS: Lower chest: Persistent moderate to large bilateral pleural effusions with significant overlying atelectasis. The heart is normal in size. No pericardial effusion. Age advanced coronary artery calcifications are noted. Hepatobiliary: No focal hepatic lesions or intrahepatic biliary dilatation. Ill-defined area of contrast enhancement and segment 7 likely a vascular shunt. The gallbladder is mildly contracted. No common bile duct dilatation. Pancreas: Scattered calcifications or likely due to prior inflammation. No worrisome pancreatic lesions or acute inflammatory process. No ductal dilatation. Spleen: Normal size. No focal lesions. Adrenals/Urinary Tract: The adrenal glands and kidneys are unremarkable. No worrisome renal lesions or hydronephrosis. The bladder appears normal. Stomach/Bowel: The stomach, duodenum, small bowel and colon are grossly normal without oral contrast. No acute inflammatory changes, mass lesions or obstructive findings. Vascular/Lymphatic: Severe/advanced atherosclerotic calcifications involving the aorta and branch vessels for age. No aneurysm or dissection. The major venous structures are patent. No mesenteric or retroperitoneal mass or adenopathy. Reproductive: The uterus and ovaries are unremarkable. Calcified fibroid again noted posteriorly. Other: Small to moderate amount of free pelvic fluid is again demonstrated.  No obvious cause. Musculoskeletal: Stable compression fracture of L1. No worrisome bone lesions. Age advanced osteoporosis. IMPRESSION: 1. Persistent moderate to large bilateral pleural effusions with overlying atelectasis. 2. No acute abdominal/pelvic findings, mass lesions or adenopathy. 3. Severe/advanced atherosclerotic calcifications involving the aorta and branch vessels for age. 4. Small to moderate amount of persistent free pelvic fluid. No obvious cause. 5. Calcified uterine fibroids. Aortic Atherosclerosis (ICD10-I70.0). Electronically Signed   By: PMarijo SanesM.D.   On: 10/22/2019 13:56   DG Chest Port 1 View  Result Date: 10/22/2019 CLINICAL DATA:  CHF respiratory distress.  COVID-19 positive EXAM: PORTABLE CHEST 1 VIEW COMPARISON:  10/20/2019 FINDINGS: Diffuse bilateral airspace disease with basilar predominance shows mild progression. Moderate left effusion has improved in the interval. Probable small right effusion. IMPRESSION: Mild progression of diffuse bilateral airspace disease which may represent edema or pneumonia. Improvement in left pleural effusion. Electronically Signed   By: CFranchot GalloM.D.   On: 10/22/2019 09:28   [place] SITE RITE  Result Date: 10/22/2019 If Site Rite image not attached, placement could not be confirmed due to current cardiac rhythm.  [place]domen Limited RUQ  Result Date: 10/22/2019 CLINICAL DATA:  Acute right upper quadrant abdominal pain. EXAM: ULTRASOUND ABDOMEN LIMITED RIGHT UPPER QUADRANT COMPARISON:  October 19, 2019. FINDINGS: Gallbladder: No gallstones or wall thickening visualized. No sonographic Murphy sign noted by sonographer. Common bile duct: Diameter: 5 mm which is within normal limits. Liver: No focal lesion identified. Within normal limits in parenchymal echogenicity. Portal vein is patent on color Doppler imaging with normal direction of blood flow towards the liver. Other: Minimal ascites is noted. IMPRESSION: Minimal ascites. No other  abnormality seen in the right upper quadrant of the abdomen. Electronically Signed   By: JMarijo ConceptionM.D.   On: 10/22/2019 09:03

## 2019-10-22 NOTE — Progress Notes (Signed)
ABG noted  Continue high flow oxygen supplementation

## 2019-10-22 NOTE — Significant Event (Addendum)
Rapid Response Event Note  Overview: Time Called: 0856 Arrival Time: 0900 Event Type: Respiratory Called for pt having acute onset respiratory distress with complaints of shortness of breath and chest pain.  Initial Focused Assessment: Pt awake lying in bed. Pt is drowsy and keeps slouching to left side. Grips equal, pt is following commands, pt able to move all extremities. Skin is flush/pale. Extremities are cool to touch. Pulses palpable. SpO2 100% on 10L [place]. Lung sounds are clear, faint crackle. Abdomen is soft, bowel sounds are faint. Pt intermittently complains of chest pain. Pt complains of back pain, states it hurts where they last did her thoracentesis.   Interventions: IV lasix IV labetolol Nitro gtt started  Plan of Care (if not transferred): PCCM consulted by Dr. Candiss Norse, pt transferred to ICU. I helped facilitate transfer. Pt transferred by RR RN and primary RN on continuous monitoring. PT overall work of breathing improved and oxygen saturation is 100% on 10L HRNC. Pt appears weak, tired. Pt no longer complaining of pain.  Event Summary: Name of Physician Notified: Dr. Candiss Norse at    Name of Consulting Physician Notified: Noe Gens, NP at    Outcome: Transferred (Comment)  RR RN had to momentarily leave this call to tend to another emergency. PCCM NP at bedside to assess pt at this time. Transfer order in place. Returned to help transfer pt.   Event End Time: [place]

## 2019-10-23 ENCOUNTER — Inpatient Hospital Stay (HOSPITAL_COMMUNITY): Payer: Medicaid Other

## 2019-10-23 DIAGNOSIS — I739 Peripheral vascular disease, unspecified: Secondary | ICD-10-CM

## 2019-10-23 DIAGNOSIS — I5033 Acute on chronic diastolic (congestive) heart failure: Secondary | ICD-10-CM

## 2019-10-23 LAB — COMPREHENSIVE METABOLIC PANEL
ALT: 17 U/L (ref 0–44)
AST: 15 U/L (ref 15–41)
Albumin: 2.4 g/dL — ABNORMAL LOW (ref 3.5–5.0)
Alkaline Phosphatase: 55 U/L (ref 38–126)
Anion gap: 13 (ref 5–15)
BUN: 36 mg/dL — ABNORMAL HIGH (ref 6–20)
CO2: 22 mmol/L (ref 22–32)
Calcium: 8.1 mg/dL — ABNORMAL LOW (ref 8.9–10.3)
Chloride: 100 mmol/L (ref 98–111)
Creatinine, Ser: 1.66 mg/dL — ABNORMAL HIGH (ref 0.44–1.00)
GFR calc Af Amer: 39 mL/min — ABNORMAL LOW (ref >60)
GFR calc non Af Amer: 33 mL/min — ABNORMAL LOW (ref >60)
Glucose, Bld: 270 mg/dL — ABNORMAL HIGH (ref 70–99)
Potassium: 4 mmol/L (ref 3.5–5.1)
Sodium: 135 mmol/L (ref 135–145)
Total Bilirubin: 0.5 mg/dL (ref 0.3–1.2)
Total Protein: 5.4 g/dL — ABNORMAL LOW (ref 6.5–8.1)

## 2019-10-23 LAB — ECHOCARDIOGRAM COMPLETE
Height: 63 in
Weight: 2504.43 oz

## 2019-10-23 LAB — PROCALCITONIN: Procalcitonin: 0.28 ng/mL

## 2019-10-23 LAB — CBC WITH DIFFERENTIAL/PLATELET
Abs Immature Granulocytes: 0.02 10*3/uL (ref 0.00–0.07)
Basophils Absolute: 0 10*3/uL (ref 0.0–0.1)
Basophils Relative: 0 %
Eosinophils Absolute: 0 10*3/uL (ref 0.0–0.5)
Eosinophils Relative: 0 %
HCT: 25.7 % — ABNORMAL LOW (ref 36.0–46.0)
Hemoglobin: 8.2 g/dL — ABNORMAL LOW (ref 12.0–15.0)
Immature Granulocytes: 1 %
Lymphocytes Relative: 6 %
Lymphs Abs: 0.2 10*3/uL — ABNORMAL LOW (ref 0.7–4.0)
MCH: 25.4 pg — ABNORMAL LOW (ref 26.0–34.0)
MCHC: 31.9 g/dL (ref 30.0–36.0)
MCV: 79.6 fL — ABNORMAL LOW (ref 80.0–100.0)
Monocytes Absolute: 0.2 10*3/uL (ref 0.1–1.0)
Monocytes Relative: 8 %
Neutro Abs: 2.2 10*3/uL (ref 1.7–7.7)
Neutrophils Relative %: 85 %
Platelets: 151 10*3/uL (ref 150–400)
RBC: 3.23 MIL/uL — ABNORMAL LOW (ref 3.87–5.11)
RDW: 13.6 % (ref 11.5–15.5)
WBC: 2.6 10*3/uL — ABNORMAL LOW (ref 4.0–10.5)
nRBC: 0 % (ref 0.0–0.2)

## 2019-10-23 LAB — D-DIMER, QUANTITATIVE: D-Dimer, Quant: 3.29 ug/mL-FEU — ABNORMAL HIGH (ref 0.00–0.50)

## 2019-10-23 LAB — GLUCOSE, CAPILLARY
Glucose-Capillary: 241 mg/dL — ABNORMAL HIGH (ref 70–99)
Glucose-Capillary: 267 mg/dL — ABNORMAL HIGH (ref 70–99)
Glucose-Capillary: 106 mg/dL — ABNORMAL HIGH (ref 70–99)
Glucose-Capillary: 177 mg/dL — ABNORMAL HIGH (ref 70–99)
Glucose-Capillary: 258 mg/dL — ABNORMAL HIGH (ref 70–99)

## 2019-10-23 LAB — TSH: TSH: 6.661 u[IU]/mL — ABNORMAL HIGH (ref 0.350–4.500)

## 2019-10-23 LAB — MAGNESIUM: Magnesium: 1.9 mg/dL (ref 1.7–2.4)

## 2019-10-23 LAB — HEMOGLOBIN A1C
Hgb A1c MFr Bld: 6.9 % — ABNORMAL HIGH (ref 4.8–5.6)
Mean Plasma Glucose: 151.33 mg/dL

## 2019-10-23 LAB — BRAIN NATRIURETIC PEPTIDE: B Natriuretic Peptide: 2187.7 pg/mL — ABNORMAL HIGH (ref 0.0–100.0)

## 2019-10-23 LAB — C-REACTIVE PROTEIN: CRP: 6.5 mg/dL — ABNORMAL HIGH (ref <1.0)

## 2019-10-23 MED ORDER — CLONAZEPAM 0.25 MG PO TBDP
0.2500 mg | ORAL_TABLET | Freq: Two times a day (BID) | ORAL | Status: DC
  Administered 2019-10-23 – 2019-11-06 (×27): 0.25 mg via ORAL
  Filled 2019-10-23 (×5): qty 1
  Filled 2019-10-23 (×2): qty 2
  Filled 2019-10-23 (×11): qty 1
  Filled 2019-10-23: qty 2
  Filled 2019-10-23 (×7): qty 1
  Filled 2019-10-23: qty 2

## 2019-10-23 MED ORDER — WHITE PETROLATUM EX OINT
TOPICAL_OINTMENT | CUTANEOUS | Status: AC
  Filled 2019-10-23: qty 28.35

## 2019-10-23 MED ORDER — METHYLPREDNISOLONE SODIUM SUCC 40 MG IJ SOLR
40.0000 mg | INTRAMUSCULAR | Status: DC
  Administered 2019-10-23 – 2019-10-24 (×2): 40 mg via INTRAVENOUS
  Filled 2019-10-23 (×2): qty 1

## 2019-10-23 MED ORDER — FUROSEMIDE 10 MG/ML IJ SOLN
40.0000 mg | Freq: Two times a day (BID) | INTRAMUSCULAR | Status: DC
  Filled 2019-10-23: qty 4

## 2019-10-23 NOTE — Evaluation (Signed)
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  She was recently hospitalized twice to [facility] for shortness of breath, her work-up showed bilateral transudative pleural effusion, she had thoracentesis suggesting transudative fluid with no malignant cells.  Reason for recurrent effusion was thought to be acute on chronic diastolic CHF.  She was also found to have liver mass.  She was finally transferred to [facility] for cardiology evaluation for recurrent CHF.  Also positive for Covid.     Clinical Impression   This 59 y/o female presents with the above. Pt was most recently in SNF rehab for approx 1 month PTA, reports was completing transfers to wheelchair with assistance and completing ADL tasks with assist. Pt now presenting with weakness, poor endurance, decreased sitting/standing balance. Pt currently requiring two person assist for safe completion of functional transfers, tolerating OOB to recliner during this session. She currently requires totalA for LB and toileting ADL, close minguard-minA for seated grooming/UB ADL. Pt fatigues easily and requiring rest breaks throughout activity completion. Pt with one instance of decreased responsiveness post transfer to recliner however VSS. Given increased time/rest this improved and pt returning to previous level of alertness. Pt will benefit from continued acute OT services, given Covid+ status pt not able to discharge to CIR level therapies (though appropriate candidate  pending length of stay/pt meeting admission requirements). At this time recommend SNF level therapies at time of discharge. Will continue to follow acutely.     Follow Up Recommendations  SNF;Supervision/Assistance - 24 hour    Equipment Recommendations  Other (comment)(TBD in next venue)           Precautions / Restrictions Precautions Precautions: Fall Precaution Comments: R BKA Required Braces or Orthoses: Other Brace(prosthesis - per pt too small currently, not in hospital) Restrictions Weight Bearing Restrictions: No      Mobility Bed Mobility Overal bed mobility: Needs Assistance Bed Mobility: Supine to Sit     Supine to sit: Mod assist;+2 for safety/equipment;HOB elevated     General bed mobility comments: Pt was fearful needing assist for LEs and for elevation of trunk with use of pad to scoot her out.   Transfers Overall transfer level: Needs assistance Equipment used: 2 person hand held assist Transfers: Sit to/from [employer] Sit to Stand: Mod assist;+2 physical assistance;From elevated surface   Squat pivot transfers: Mod assist;+2 physical assistance;From elevated surface     General transfer comment: Assisted pt to stand with pt holding onto OT/PTs arms and pt stood to be cleaned as she had a BM with nurse cleaning her and then squat pivot to chair.  Pt was able to assist with the transfer and did block her left knee for support and transfer to pts left.      Balance Overall balance assessment: Needs assistance Sitting-balance support: No upper extremity supported;Feet supported Sitting balance-Leahy Scale: Fair     Standing balance support: Bilateral upper extremity supported;During functional activity Standing balance-Leahy Scale: Zero Standing balance comment: could not stand fully upright and needed +2 mod assist.  ADL either performed or assessed with clinical judgement   ADL Overall ADL's  : Needs assistance/impaired Eating/Feeding: Set up;Sitting   Grooming: Min guard;Set up;Wash/dry face;Sitting   Upper Body Bathing: Minimal assistance;Sitting   Lower Body Bathing: Maximal assistance;+2 for physical assistance;+2 for safety/equipment;Sitting/lateral leans   Upper Body Dressing : Minimal assistance;Sitting   Lower Body Dressing: Maximal assistance;+2 for physical assistance;+2 for safety/equipment;Sitting/lateral leans;Sit to/from stand   Toilet Transfer: Moderate assistance;+2 for physical assistance;+2 for safety/equipment;Squat-pivot Toilet Transfer Details (indicate cue type and reason): simulated via transfer to [place] and Hygiene: Total assistance;+2 for physical assistance;+2 for safety/equipment;Sit to/from stand Toileting - Clothing Manipulation Details (indicate cue type and reason): RN providing pericare after BM while OT/PT assisted with standing     Functional mobility during ADLs: Moderate assistance;+2 for physical assistance;+2 for safety/equipment(squat pivot transfer)       Vision         Perception     Praxis      Pertinent Vitals/Pain Pain Assessment: No/denies pain     Hand Dominance Right   Extremity/Trunk Assessment Upper Extremity Assessment Upper Extremity Assessment: Generalized weakness(good strength but fatigues easily)   Lower Extremity Assessment Lower Extremity Assessment: Defer to PT evaluation       Communication Communication Communication: No difficulties   Cognition Arousal/Alertness: Awake/alert Behavior During Therapy: WFL for tasks assessed/performed Overall Cognitive Status: Within Functional Limits for tasks assessed                                     General Comments  6LO2 with VSS throughout.  Did have episode of not being responsive once in chair for a few minutes however pts vitals were stable (including BP)    Exercises Exercises: General Upper  Extremity;General Lower Extremity General Exercises - Upper Extremity Shoulder Flexion: AROM;Both;5 reps General Exercises - Lower Extremity Long Arc Quad: AROM;Both;5 reps;Seated   Shoulder Instructions      Home Living Family/patient expects to be discharged to:: Skilled nursing facility Living Arrangements: Spouse/significant other Available Help at Discharge: Family;Available PRN/intermittently Type of Home: House Home Access: Ramped entrance     Home Layout: One level     Bathroom Shower/Tub: Teacher, early years/pre: Standard     Home Equipment: Wheelchair - Insurance claims handler - 2 wheels;Bedside commode;Grab bars - tub/shower;Hand held shower head   Additional Comments: chronic O2 at baseline, was in rehab for a month PTA      Prior Functioning/Environment Level of Independence: Needs assistance  Gait / Transfers Assistance Needed: Pt able to perform wheelchair transfers with assist and propel wheelchair in hallways at facility ADL's / Homemaking Assistance Needed: Assist with ADLS            OT Problem List: Decreased strength;Decreased range of motion;Decreased activity tolerance;Impaired balance (sitting and/or standing);Decreased knowledge of use of DME or AE;Cardiopulmonary status limiting activity;Decreased knowledge of precautions      OT Treatment/Interventions: Self-care/ADL training;Therapeutic exercise;Energy conservation;DME and/or AE instruction;Therapeutic activities;Patient/family education;Balance training;Cognitive remediation/compensation    OT Goals(Current goals can be found in the care plan section) Acute Rehab OT Goals Patient Stated Goal: to go to Rehab and then home OT Goal Formulation: With patient Time For Goal Achievement: 11/06/19 Potential to Achieve Goals: Good  OT Frequency: Min 2X/week   Barriers to D/C:            Co-evaluation PT/OT/SLP Co-Evaluation/Treatment: Yes Reason for  Co-Treatment: For  patient/therapist safety;Complexity of the patient's impairments (multi-system involvement) PT goals addressed during session: Mobility/safety with mobility OT goals addressed during session: ADL's and self-care      AM-PAC OT "6 Clicks" Daily Activity     Outcome Measure Help from another person eating meals?: None Help from another person taking care of personal grooming?: A Little Help from another person toileting, which includes using toliet, bedpan, or urinal?: Total Help from another person bathing (including washing, rinsing, drying)?: A Lot Help from another person to put on and taking off regular upper body clothing?: A Little Help from another person to put on and taking off regular lower body clothing?: Total 6 Click Score: 14   End of Session Equipment Utilized During Treatment: Gait belt Nurse Communication: Mobility status;Need for lift equipment  Activity Tolerance: Patient tolerated treatment well;Patient limited by fatigue Patient left: in chair;with call bell/phone within reach  OT Visit Diagnosis: Muscle weakness (generalized) (M62.81);Other abnormalities of gait and mobility (R26.89)                Time: 4917-9150 OT Time Calculation (min): 30 min Charges:  OT General Charges $OT Visit: 1 Visit OT Evaluation $OT Eval Moderate Complexity: 1 Mod  Lou Cal, OT [employer] Pager [phone] Office [phone]   Raymondo Band 10/23/2019, 1:34 PM

## 2019-10-23 NOTE — Plan of Care (Signed)

## 2019-10-23 NOTE — Evaluation (Signed)
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  She was recently hospitalized twice to [facility] for shortness of breath, her work-up showed bilateral transudative pleural effusion, she had thoracentesis suggesting transudative fluid with no malignant cells.  Reason for recurrent effusion was thought to be acute on chronic diastolic CHF.  She was also found to have liver mass.  She was finally transferred to [facility] for cardiology evaluation for recurrent CHF.  Also positive for Covid.    Clinical Impression  Pt admitted with above diagnosis. Pt was able to squat pivot but needed mod assist of 2 and is fearful of falling. Has very poor endurance for activity and needs assist and cues for safety.  Pt so fatigued once in chair she did not want to do exercise but with encouragement did 5 reps of LE and UE exercises.   Pt currently with functional limitations due to the deficits listed below (see PT Problem List). Pt will benefit from skilled PT to increase their independence and safety with mobility to allow discharge to the venue listed below.      Follow Up Recommendations SNF;Supervision/Assistance - 24 hour    Equipment Recommendations  None recommended by PT    Recommendations for Other Services       Precautions / Restrictions Precautions Required Braces or Orthoses: Other Brace(prosthesis - per pt too small currently, not in hospital) Restrictions Weight Bearing Restrictions: No      Mobility  Bed  Mobility Overal bed mobility: Needs Assistance Bed Mobility: Supine to Sit     Supine to sit: Mod assist;+2 for safety/equipment;HOB elevated     General bed mobility comments: Pt was fearful needing assist for LEs and for elevation of trunk with use of pad to scoot her out.   Transfers Overall transfer level: Needs assistance Equipment used: 2 person hand held assist Transfers: Sit to/from [employer] Sit to Stand: Mod assist;+2 physical assistance;From elevated surface   Squat pivot transfers: Mod assist;+2 physical assistance;From elevated surface     General transfer comment: Assisted pt to stand with pt holding onto PTs arms and pt stood to be cleaned as she had a BM with nurse cleaning her and then squat pivot to chair.  Pt was able to assist with the transfer and did block her left knee for support and transfer to pts left.    Ambulation/Gait                Stairs            Wheelchair Mobility    Modified Rankin (Stroke Patients Only)       Balance Overall balance assessment: Needs assistance Sitting-balance support: No upper extremity supported;Feet supported Sitting balance-Leahy Scale: Fair     Standing balance support: Bilateral upper extremity supported;During functional activity Standing balance-Leahy Scale: Zero Standing balance comment: could not stand fully upright and needed +2 mod assist.                              Pertinent  Vitals/Pain Pain Assessment: No/denies pain    Home Living Family/patient expects to be discharged to:: Skilled nursing facility Living Arrangements: Spouse/significant other Available Help at Discharge: Family;Available PRN/intermittently Type of Home: House Home Access: Ramped entrance     Home Layout: One level Home Equipment: Wheelchair - Insurance claims handler - 2 wheels;Bedside commode;Grab bars - tub/shower;Hand held shower head(3LO2) Additional Comments: was in rehab  for a month    Prior Function Level of Independence: Needs assistance   Gait / Transfers Assistance Needed: Pt able to perform wheelchair transfers with assist and propel wheelchair in hallways at facility  ADL's / Homemaking Assistance Needed: A with ADLS  Comments: had been needing help and now spouse is disabled.  But he can do for himself.  Daughter in law helps to an extent stopping by.       Hand Dominance   Dominant Hand: Right    Extremity/Trunk Assessment   Upper Extremity Assessment Upper Extremity Assessment: Defer to OT evaluation    Lower Extremity Assessment Lower Extremity Assessment: RLE deficits/detail;LLE deficits/detail RLE Deficits / Details: grossly 3-/5 LLE Deficits / Details: grossly 3-/5       Communication   Communication: No difficulties  Cognition Arousal/Alertness: Awake/alert Behavior During Therapy: WFL for tasks assessed/performed Overall Cognitive Status: Within Functional Limits for tasks assessed                                        General Comments General comments (skin integrity, edema, etc.): 6LO2 with VSS throughout.  Did have episode of not being responsive once in chari for a few minutes however pts BP was stable.      Exercises General Exercises - Lower Extremity Long Arc Quad: AROM;Both;5 reps;Seated   Assessment/Plan    PT Assessment Patient needs continued PT services  PT Problem List Decreased activity tolerance;Decreased balance;Decreased mobility;Decreased knowledge of use of DME;Decreased safety awareness;Decreased knowledge of precautions;Cardiopulmonary status limiting activity       PT Treatment Interventions DME instruction;Gait training;Functional mobility training;Therapeutic activities;Therapeutic exercise;Balance training;Patient/family education;Wheelchair mobility training    PT Goals (Current goals can be found in the Care Plan section)  Acute Rehab PT Goals Patient Stated Goal: to go  to Rehab and then home PT Goal Formulation: With patient Time For Goal Achievement: 11/06/19 Potential to Achieve Goals: Good    Frequency Min 2X/week   Barriers to discharge Decreased caregiver support      Co-evaluation PT/OT/SLP Co-Evaluation/Treatment: Yes Reason for Co-Treatment: Complexity of the patient's impairments (multi-system involvement);For patient/therapist safety PT goals addressed during session: Mobility/safety with mobility         AM-PAC PT "6 Clicks" Mobility  Outcome Measure Help needed turning from your back to your side while in a flat bed without using bedrails?: A Lot Help needed moving from lying on your back to sitting on the side of a flat bed without using bedrails?: A Lot Help needed moving to and from a bed to a chair (including a wheelchair)?: A Lot Help needed standing up from a chair using your arms (e.g., wheelchair or bedside chair)?: A Lot Help needed to walk in hospital room?: Total Help needed climbing 3-5 steps with a railing? : Total 6 Click Score: 10    End of Session Equipment Utilized During Treatment: Gait belt;Oxygen(6LO2) Activity Tolerance: Patient limited by fatigue Patient left: in chair;with call bell/phone within reach Nurse Communication: Mobility status PT Visit  Diagnosis: Muscle weakness (generalized) (M62.81)    Time: 2904-7533 PT Time Calculation (min) (ACUTE ONLY): 30 min   Charges:   PT Evaluation $PT Eval Moderate Complexity: 1 Mod          Diego Delancey W,PT Acute Rehabilitation Services Pager:  [phone]  Office:  [phone]    Denice Paradise 10/23/2019, 11:25 AM

## 2019-10-23 NOTE — Progress Notes (Signed)
Unable to obtain CVP readings at this time due to pt's refusal to have PICC line placed.

## 2019-10-23 NOTE — Progress Notes (Signed)
NAME:  Jamie [name], MRN:  [id number], DOB:  1960-10-21, LOS: 2 ADMISSION DATE:  10/21/2019, CONSULTATION DATE:  10/22/2019 REFERRING MD: Dr Candiss Norse, CHIEF COMPLAINT: Shortness of breath  Brief History   59 y/o F who initially presented 2/28 to [facility] from SNF with reports of SOB, chest pain and abdominal pain.    History of present illness   59 y/o F who presented to [facility] on 2/28 with reports of shortness of breath, chest pain and abdominal pain.  She reports she has been in a skilled nursing facility for approximately 1 month due to frequent hospitalizations for heart failure.  In 07/2019 she was admitted to [facility] with an ECHO at that time that showed an LVEF of 55-60%, moderate concentric hypertrophy, mild anteroseptal hypokinesis and mild inferoseptal hypokinesis.  She was admitted from 1/30 - 09/25/19 and again from 2/8-2/13 for CHF exacerbations.  Has had over 5 thoracentesis performed in the last 10 months which have been reportedly transudative (thoracentesis 08/14/20 with 1550 ml removed --pl fluid 1.0 albumin, serum albumin 3.2, pleural LDH 168, serum 778, pleural to serum protein ration <0.5 and pleural to serum LDH issue <0.6).  She reports as much is 2 L being removed off 1 side at a time.  Repeat ECHO 10/01/2019 shows an LVEF of 55 to 60% with restrictive LV filling pattern consistent with elevated LA pressure and borderline concentric LVH.    She was readmitted 2/22 to [facility] with complaints of shortness of breath and hypertensive crisis with initial BP of 209/99.  She was Covid negative at that time.  CT of the abdomen / pelvis showed large bilateral pleural effusions with adjacent atelectasis.  Was treated with IV Lasix, Vasotec and nitroglycerin drip.  The CT of the abdomen also showed additional 1.5 cm ill-defined liver lesion with enhancement.  MRI was recommended but has not been completed due to rising renal function. ECHO 2/27 showed an LVEF of 55 to  60% with mild concentric LVH, both mean arterial pressure and LVEDP elevated, mild MR, mild TR, trace PR and RV systolic pressure of 48 mmHg.  The patient was transferred to [facility] on 2/28 for further evaluation by the advanced heart failure team.  On presentation to [facility] she was found to be Covid positive.  Inflammatory markers are pending at time of dictation.  The patient was found on 3/1 am with respiratory distress, hypertensive to greater than 017 systolic with complaints of shortness of breath and chest pain.  O2 was increased to 10 L salter.  She was treated with IV labetalol, nitro drip, and IV Lasix with 400 mL urinary output returned.   PCCM consulted for evaluation and transfer to ICU.  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  Unable to wear prosthesis due to lower extremity swelling. 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  No other abnormalities seen in the RUQ of the abdomen.  EKG 3/1 >>  Micro Data:  COVID 2/28 >> positive  HIV 3/1 >> negative  RVP 3/1 >>  Antimicrobials:  None  Interim history/subjective:  Report shortness of breath  is improving No abdominal pain today  Objective   Blood pressure 138/66, pulse 71, temperature 97.8 F (36.6 C), temperature source Axillary, resp. rate 15, height 5\' 3"  (1.6 m), weight 71 kg, SpO2 96 %.    FiO2 (%):  [40 %-50 %] 40 %   Intake/Output Summary (Last 24 hours) at 10/23/2019 0830 Last data filed at 10/23/2019 0600 Gross per 24 hour  Intake 580.15 ml  Output 700 ml  Net -119.85 ml   Filed Weights   10/21/19 1520 10/22/19 0411 10/23/19 0500  Weight: 70.1 kg 74.3 kg  71 kg    Examination: General: Chronically ill-appearing HENT: Moist oral mucosa  Lungs: Decreased air movement at the bases S1-S2 appreciated Cardiovascular: S1-S2 appreciated Abdomen: Bowel sounds appreciated, no tenderness Extremities: No clubbing, no edema Neuro: Alert and oriented x3 GU: [facility] Problem list     Assessment & Plan:  Acute respiratory distress in setting of suspected flash pulmonary edema Acute on chronic hypoxemic respiratory failure -Continue oxygen supplementation -Continue diuresis -Continue high flow oxygen  Hypertensive emergency -Wean off Cleviprex  Acute on chronic diastolic congestive heart failure Left ventricle ejection fraction 55 to 60% -Being followed by CHF service -Trend BNP -Appreciate cardiology input  Bilateral pleural effusions -Has had multiple thoracentesis in the past all been transudative -With improvement in symptoms, no indication for thoracentesis at present  Covid positive -Monitor -This does not appear to be playing a role in current symptoms  Acute kidney injury on chronic kidney disease stage III -Trend electrolytes -Monitor closely with diuresis  Diabetes -Moderate scale SSI  Abdominal pain -She does have a liver lesion, unable to tolerate MRI at present  Discussed with Dr. Candiss Norse Patient will be transitioned to the medical floor Best practice:  Diet: Liquid DVT prophylaxis: Heparin GI prophylaxis: Not applicable Glucose control: SSI Mobility: Bedrest Code Status: Full code Family Communication: Discussed with patient Disposition: Back to progressive floor  Labs   CBC: Recent Labs  Lab 10/21/19 1957 10/22/19 0300 10/22/19 1152 10/23/19 0513  WBC 8.0 6.2  --  2.6*  NEUTROABS  --   --   --  2.2  HGB 8.9* 8.5* 8.8* 8.2*  HCT 27.8* 27.5* 26.0* 25.7*  MCV 80.8 83.1  --  79.6*  PLT 176 179  --  416    Basic Metabolic Panel: Recent Labs  Lab 10/21/19 1957 10/22/19 0300  10/22/19 1152  NA 137 139 138  K 3.7 3.7 3.5  CL 102 104  --   CO2 25 22  --   GLUCOSE 214* 147*  --   BUN 33* 28*  --   CREATININE 1.22* 1.22*  --   CALCIUM 8.3* 8.3*  --   MG  --  1.5*  --    GFR: Estimated Creatinine Clearance: 46.9 mL/min (A) (by C-G formula based on SCr of 1.22 mg/dL (H)). Recent Labs  Lab 10/21/19 1957 10/21/19 2211 10/22/19 0300 10/22/19 0930 10/22/19 1019 10/23/19 0513  PROCALCITON 0.20  --   --   --  0.22  --   WBC 8.0  --  6.2  --   --  2.6*  LATICACIDVEN 0.8 1.1  --  1.2  --   --     Liver Function Tests: Recent Labs  Lab 10/21/19 1957  AST 18  ALT 17  ALKPHOS 59  BILITOT 0.8  PROT 5.9*  ALBUMIN 2.6*   No results for input(s): LIPASE, AMYLASE in the last 168 hours. No results for  input(s): AMMONIA in the last 168 hours.  ABG    Component Value Date/Time   PHART 7.496 (H) 10/22/2019 1152   PCO2ART 37.8 10/22/2019 1152   PO2ART 75.0 (L) 10/22/2019 1152   HCO3 29.2 (H) 10/22/2019 1152   TCO2 30 10/22/2019 1152   O2SAT 96.0 10/22/2019 1152     Coagulation Profile: Recent Labs  Lab 10/21/19 1957  INR 1.0    Cardiac Enzymes: No results for input(s): CKTOTAL, CKMB, CKMBINDEX, TROPONINI in the last 168 hours.  HbA1C: No results found for: HGBA1C  CBG: Recent Labs  Lab 10/22/19 0755 10/22/19 1148 10/22/19 1534 10/22/19 2120 10/23/19 0753  GLUCAP 147* 160* 152* 275* 241*    Review of Systems:   No abdominal pain No chest pains or chest discomfort Shortness of breath improved  Past Medical History  She,  has a past medical history of Amputation below knee (HCC) RT, Chronic diastolic HF (heart failure) ([place]) (10/21/2019), COPD (chronic obstructive pulmonary disease) ([place]), Diabetes type 2, uncontrolled ([place]), Gastritis and duodenitis, HTN (hypertension) (10/21/2019), PAD (peripheral artery disease) ([place]) (10/21/2019), PAD (peripheral artery disease) ([place]), PNA (pneumonia), and TIA (transient ischemic attack).   Surgical  History    Past Surgical History:  Procedure Laterality Date  . BELOW KNEE LEG AMPUTATION Right      Social History   reports that she has never smoked. She has never used smokeless tobacco. She reports that she does not drink alcohol or use drugs.   Family History   Her family history includes Diabetes in her brother; Heart attack in her paternal grandmother; Hypertension in her brother and maternal grandmother.   Allergies Allergies  Allergen Reactions  . Codeine Other (See Comments)    headaches  . Meperidine And Related Other (See Comments)    headaches  . Tetracyclines & Related Other (See Comments)    Caused yeast infections    Sherrilyn Rist, MD [place], PCCM Cell: [phone]

## 2019-10-23 NOTE — Progress Notes (Signed)
Pt has had little urine output during this shift. (Est. 300-350 ccs) Pt told that we may need to put a foley in due to decreased output. Pt refuses foley catheter at this time. Will continue to monitor.

## 2019-10-23 NOTE — Progress Notes (Signed)
Spoke to [employer], per her request PICC will be placed later today once pt is back in the bed. [employer]

## 2019-10-23 NOTE — Progress Notes (Addendum)
PROGRESS NOTE                                                                                                                                                                                                             Patient Demographics:    Jamie [name], is a 59 y.o. 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    She was recently hospitalized twice to [facility] for shortness of breath, her work-up showed bilateral transudative pleural effusion, she had thoracentesis suggesting transudative fluid with no malignant cells.  Reason for recurrent effusion was thought to be acute on chronic diastolic CHF.  She was also found to have liver mass.  She was finally transferred to [facility] for cardiology evaluation for recurrent CHF.   Subjective:   Patient in bed, appears comfortable, denies any headache, no fever, no chest pain or pressure, no shortness of breath , no abdominal pain. No focal weakness.   Assessment  & Plan :    1.  Severe Acute hypoxic respiratory failure due to recurrent Acute on chronic diastolic CHF EF 88% .  Recurrent bilateral pleural effusions which are transudate if worked up in [place], EF 60%.  She was in severe respiratory distress morning of 11-12-2019 and almost coded, he also had evidence of hypertensive urgency likely due to respiratory distress.    She was started on high-dose IV Lasix, IV nitro drip  and blood pressure medications, she has diuresed very well although she had incontinence and output was not recorded, respiratory distress is much improved on 10/23/2019 that is 24 hours after the treatment regimen.  She will be continued on oral beta-blocker, hydralazine, Nitropaste and Lasix combination.  Cardiology on board Case discussed with cardiologist Dr. Aundra Dubin on 10/23/2019.  Plan is for left heart cath once she clinically  stabilizes. For now continue to titrate down oxygen and increase activity as tolerated.  2. Acute Covid 19 Viral Pneumonitis during the ongoing 2020 Covid 19 Pandemic - her main disease process and hypoxia is due to #1 above which is acute on chronic diastolic CHF.  Her Covid disease itself seems to be mild to moderate for which I will place her on low-dose steroids and remdesivir.  Monitor closely.  Encouraged the patient to sit up in chair in the daytime use I-S and flutter valve for pulmonary toiletry and then prone in bed when at night.  SpO2: 97 % O2 Flow Rate (L/min): 20 L/min FiO2 (%): 40 %  Recent Labs  Lab 10/21/19 1839 10/21/19 1957 10/22/19 1019 10/23/19 0513  CRP  --   --  6.0* 6.5*  DDIMER  --   --  3.75* 3.29*  FERRITIN  --   --  177  --   BNP  --  1,040.2*  --  2,187.7*  PROCALCITON  --  0.20 0.22 0.28  SARSCOV2NAA POSITIVE*  --   --   --     Hepatic Function Latest Ref Rng & Units 10/23/2019 10/21/2019  Total Protein 6.5 - 8.1 g/dL 5.4(L) 5.9(L)  Albumin 3.5 - 5.0 g/dL 2.4(L) 2.6(L)  AST 15 - 41 U/L 15 18  ALT 0 - 44 U/L 17 17  Alk Phosphatase 38 - 126 U/L 55 59  Total Bilirubin 0.3 - 1.2 mg/dL 0.5 0.8      3.  PAD.  S/p right BKA, CT evidence of abdominal diffuse calcification with recurrent abdominal pain.  Highly questionable for ischemic bowel, for now continue aspirin and statin for secondary prevention, clear liquid diet and monitor.  She likely has diffuse atherosclerosis due to uncontrolled diabetes mellitus and hypertension.  She denies  any history of smoking, with supportive care she is somewhat better and tolerating liquid diet.  4.  Hypertensive crisis.  Currently on IV Clevidipine drip, oral medications adjusted and as needed IV hydralazine added.  Will try to titrate her off of drip and moved to PCU.   5.  GERD with nausea.  Also has HX of diabetic gastroparesis.  For now supportive care with clear liquid diet, IV PPI and nausea medications.   6.  Dyslipidemia.  Continue statin.  7.  Anemia of chronic disease.  Monitor.  8.  Hypertensive urgency.  Currently on nitro drip, as needed IV hydralazine added, continue oral labetalol and Norvasc.  Monitor and adjust.  9.  Depression and anxiety.  Continue combination of bupropion, fluoxetine, buspirone and Xanax.  10.  AKI at [facility] with creatinine of 1.5.  Diuresis she continues to have a creatinine of close to 1.6, this could be her new baseline, also had some IV contrast from CT scan on 10/22/2019 and currently requires diuretics, will continue to monitor renal function and involve nephrology if needed.  11. Liver mass.  Needs abdominal MRI once pulmonary status is improved.  12. DM2 - lantus + ISS  No results found for: HGBA1C   CBG (last 3)  Recent Labs    10/22/19 1534 10/22/19 2120 10/23/19 0753  GLUCAP 152* 275* 241*     Condition - Extremely Guarded  Family Communication  :  Daughter in law RN on 10/22/19 @ [phone], called on 10/23/2019 at 10:20 AM - full mailbox.  Code Status :  Full  Diet :   Diet Order            Diet clear  liquid Room service appropriate? Yes; Fluid consistency: Thin  Diet effective now               Disposition Plan  : Move her from ICU to PCU if she comes off of IV clevidipine, stays in the hospital due to severe acute hypoxic respiratory failure, CHF work-up, pending left heart cath.  Treatment for AKI.  Consults  : Pulmonary, cardiology  Procedures  :    CT - 1. Persistent moderate to large bilateral pleural  effusions with overlying atelectasis. 2. No acute abdominal/pelvic findings, mass lesions or adenopathy. 3. Severe/advanced atherosclerotic calcifications involving the aorta and branch vessels for age. 4. Small to moderate amount of persistent free pelvic fluid. No obvious cause. 5. Calcified uterine fibroids. Aortic Atherosclerosis.  TTE from [facility] - EF 60%, dCHF  PICC line and Foley will be requested.  PUD Prophylaxis : PPI  DVT Prophylaxis  :  Lovenox    Lab Results  Component Value Date   PLT 151 10/23/2019    Inpatient Medications  Scheduled Meds: . amLODipine  10 mg Oral Daily  . aspirin EC  81 mg Oral Daily  . atorvastatin  80 mg Oral QHS  . busPIRone  5 mg Oral Daily  . Chlorhexidine Gluconate Cloth  6 each Topical Daily  . clonazepam  0.25 mg Oral BID  . enoxaparin (LOVENOX) injection  40 mg Subcutaneous BID  . FLUoxetine  40 mg Oral Daily  . folic acid  1 mg Oral Daily  . furosemide  80 mg Intravenous BID  . hydrALAZINE  100 mg Oral Q8H  . insulin aspart  0-15 Units Subcutaneous TID WC  . insulin glargine  20 Units Subcutaneous QHS  . labetalol  300 mg Oral BID  . methylPREDNISolone (SOLU-MEDROL) injection  40 mg Intravenous Q24H  . multivitamin with minerals  1 tablet Oral Daily  . nitroGLYCERIN  1 inch Topical Q6H  . pantoprazole (PROTONIX) IV  40 mg Intravenous Q12H  . thiamine  100 mg Oral Daily   Continuous Infusions: . clevidipine 2 mg/hr (10/23/19 0600)  . remdesivir 100 mg in NS 100 mL     PRN Meds:.acetaminophen, albuterol, ALPRAZolam, hydrALAZINE, HYDROcodone-acetaminophen, ipratropium-albuterol, morphine injection, nitroGLYCERIN, ondansetron (ZOFRAN) IV  Antibiotics  :    Anti-infectives (From admission, onward)   Start     Dose/Rate Route Frequency Ordered Stop   10/23/19 1000  remdesivir 100 mg in sodium chloride 0.9 % 100 mL IVPB     100 mg 200 mL/hr over 30 Minutes Intravenous Daily 10/22/19 1713 10/27/19 0959   10/22/19 1800  remdesivir 200  mg in sodium chloride 0.9% 250 mL IVPB     200 mg 580 mL/hr over 30 Minutes Intravenous Once 10/22/19 1713 10/22/19 1843   10/21/19 2100  cefTRIAXone (ROCEPHIN) 1 g in sodium chloride 0.9 % 100 mL IVPB  Status:  Discontinued     1 g 200 mL/hr over 30 Minutes Intravenous Every 24 hours 10/21/19 1744 10/22/19 0748   10/21/19 2100  azithromycin (ZITHROMAX) tablet 500 mg  Status:  Discontinued     500 mg Oral Daily 10/21/19 1744 10/22/19 0748       Time Spent in minutes  30   Lala Lund M.D on 10/23/2019 at 9:53 AM  To page go to [email or web address] - password [facility]  [facility] -  Office  [phone]     See all Orders from today for further details    Objective:   Vitals:   10/23/19 0600  10/23/19 0700 10/23/19 0755 10/23/19 0800  BP: (!) 145/64 138/66  137/66  Pulse: 70 71  70  Resp: 12 15  15   Temp:   97.8 F (36.6 C)   TempSrc:   Axillary   SpO2: 97% 96%  97%  Weight:      Height:        Wt Readings from Last 3 Encounters:  10/23/19 71 kg     Intake/Output Summary (Last 24 hours) at 10/23/2019 0953 Last data filed at 10/23/2019 0600 Gross per 24 hour  Intake 580.15 ml  Output 700 ml  Net -119.85 ml     Physical Exam  Awake Alert, No new F.N deficits,  [place].AT,PERRAL Supple Neck,No JVD, No cervical lymphadenopathy appriciated.  Symmetrical Chest wall movement, Good air movement bilaterally, fine rales RRR,No Gallops, Rubs or new Murmurs, No Parasternal Heave +ve B.Sounds, Abd Soft, No tenderness, No organomegaly appriciated, No rebound - guarding or rigidity. No Cyanosis,   R. BKA     Data Review:    CBC Recent Labs  Lab 10/21/19 1957 10/22/19 0300 10/22/19 1152 10/23/19 0513  WBC 8.0 6.2  --  2.6*  HGB 8.9* 8.5* 8.8* 8.2*  HCT 27.8* 27.5* 26.0* 25.7*  PLT 176 179  --  151  MCV 80.8 83.1  --  79.6*  MCH 25.9* 25.7*  --  25.4*  MCHC 32.0 30.9  --  31.9  RDW 14.0 14.1  --  13.6  LYMPHSABS  --   --   --  0.2*  MONOABS  --   --   --  0.2    EOSABS  --   --   --  0.0  BASOSABS  --   --   --  0.0    Chemistries  Recent Labs  Lab 10/21/19 1957 10/22/19 0300 10/22/19 1152 10/23/19 0513  NA 137 139 138 135  K 3.7 3.7 3.5 4.0  CL 102 104  --  100  CO2 25 22  --  22  GLUCOSE 214* 147*  --  270*  BUN 33* 28*  --  36*  CREATININE 1.22* 1.22*  --  1.66*  CALCIUM 8.3* 8.3*  --  8.1*  MG  --  1.5*  --  1.9  AST 18  --   --  15  ALT 17  --   --  17  ALKPHOS 59  --   --  55  BILITOT 0.8  --   --  0.5   ------------------------------------------------------------------------------------------------------------------ No results for input(s): CHOL, HDL, LDLCALC, TRIG, CHOLHDL, LDLDIRECT in the last 72 hours.  No results found for: HGBA1C ------------------------------------------------------------------------------------------------------------------ No results for input(s): TSH, T4TOTAL, T3FREE, THYROIDAB in the last 72 hours.  Invalid input(s): FREET3  Cardiac Enzymes No results for input(s): CKMB, TROPONINI, MYOGLOBIN in the last 168 hours.  Invalid input(s): CK ------------------------------------------------------------------------------------------------------------------    Component Value Date/Time   BNP 2,187.7 (H) 10/23/2019 1610    Micro Results Recent Results (from the past 240 hour(s))  SARS CORONAVIRUS 2 (TAT 6-24 HRS) Nasopharyngeal Nasopharyngeal Swab     Status: Abnormal   Collection Time: 10/21/19  6:39 PM   Specimen: Nasopharyngeal Swab  Result Value Ref Range Status   SARS Coronavirus 2 POSITIVE (A) NEGATIVE Final    Comment: RESULT CALLED TO, READ BACK BY AND VERIFIED WITH: K. AJRDY,RN 0017 10/22/2019 T. TYSOR (NOTE) SARS-CoV-2 target nucleic acids are DETECTED. The SARS-CoV-2 RNA is generally detectable in upper and lower respiratory specimens during the acute phase  of infection. Positive results are indicative of the presence of SARS-CoV-2 RNA. Clinical correlation with patient history  and other diagnostic information is  necessary to determine patient infection status. Positive results do not rule out bacterial infection or co-infection with other viruses.  The expected result is Negative. Fact Sheet for Patients: SugarRoll.be Fact Sheet for Healthcare Providers: https://www.woods-mathews.com/ This test is not yet approved or cleared by the [place] FDA and  has been authorized for detection and/or diagnosis of SARS-CoV-2 by FDA under an Emergency Use Authorization (EUA). This EUA will remain  in effect (meaning this test can be used) for th e duration of the COVID-19 declaration under Section 564(b)(1) of the Act, 21 U.S.C. section 360bbb-3(b)(1), unless the authorization is terminated or revoked sooner. Performed at [facility], [place] [address], [place], [place] [address]   MRSA PCR Screening     Status: None   Collection Time: 10/22/19  2:51 PM   Specimen: Nasal Mucosa; Nasopharyngeal  Result Value Ref Range Status   MRSA by PCR NEGATIVE NEGATIVE Final    Comment:        The GeneXpert MRSA Assay (FDA approved for NASAL specimens only), is one component of a comprehensive MRSA colonization surveillance program. It is not intended to diagnose MRSA infection nor to guide or monitor treatment for MRSA infections. Performed at [facility], [place] [address], [place], [place] [address]     Radiology Reports CT ABDOMEN PELVIS W CONTRAST  Result Date: 10/22/2019 CLINICAL DATA:  Diffuse abdominal pain and anemia. EXAM: CT ABDOMEN AND PELVIS WITH CONTRAST TECHNIQUE: Multidetector CT imaging of the abdomen and pelvis was performed using the standard protocol following bolus administration of intravenous contrast. CONTRAST:  68m OMNIPAQUE IOHEXOL 300 MG/ML  SOLN COMPARISON:  CT scan 10/19/2019 FINDINGS: Lower chest: Persistent moderate to large bilateral pleural effusions with significant overlying atelectasis.  The heart is normal in size. No pericardial effusion. Age advanced coronary artery calcifications are noted. Hepatobiliary: No focal hepatic lesions or intrahepatic biliary dilatation. Ill-defined area of contrast enhancement and segment 7 likely a vascular shunt. The gallbladder is mildly contracted. No common bile duct dilatation. Pancreas: Scattered calcifications or likely due to prior inflammation. No worrisome pancreatic lesions or acute inflammatory process. No ductal dilatation. Spleen: Normal size. No focal lesions. Adrenals/Urinary Tract: The adrenal glands and kidneys are unremarkable. No worrisome renal lesions or hydronephrosis. The bladder appears normal. Stomach/Bowel: The stomach, duodenum, small bowel and colon are grossly normal without oral contrast. No acute inflammatory changes, mass lesions or obstructive findings. Vascular/Lymphatic: Severe/advanced atherosclerotic calcifications involving the aorta and branch vessels for age. No aneurysm or dissection. The major venous structures are patent. No mesenteric or retroperitoneal mass or adenopathy. Reproductive: The uterus and ovaries are unremarkable. Calcified fibroid again noted posteriorly. Other: Small to moderate amount of free pelvic fluid is again demonstrated. No obvious cause. Musculoskeletal: Stable compression fracture of L1. No worrisome bone lesions. Age advanced osteoporosis. IMPRESSION: 1. Persistent moderate to large bilateral pleural effusions with overlying atelectasis. 2. No acute abdominal/pelvic findings, mass lesions or adenopathy. 3. Severe/advanced atherosclerotic calcifications involving the aorta and branch vessels for age. 4. Small to moderate amount of persistent free pelvic fluid. No obvious cause. 5. Calcified uterine fibroids. Aortic Atherosclerosis (ICD10-I70.0). Electronically Signed   By: PMarijo SanesM.D.   On: 10/22/2019 13:56   DG CHEST PORT 1 VIEW  Result Date: 10/23/2019 CLINICAL DATA:  Acute respiratory  insufficiency EXAM: PORTABLE CHEST 1 VIEW COMPARISON:  October 22, 2019 FINDINGS: The heart size and mediastinal contours are unchanged with mild cardiomegaly. There is a small left pleural effusion. There is slight interval improvement in the hazy/interstitial markings throughout both lungs. No acute osseous abnormality. IMPRESSION: Slight interval improvement in the bilateral hazy airspace opacities which could be due to pulmonary edema or infectious etiology. Small left pleural effusion. Electronically Signed   By: Prudencio Pair M.D.   On: 10/23/2019 06:32   DG Chest Port 1 View  Result Date: 10/22/2019 CLINICAL DATA:  CHF respiratory distress.  COVID-19 positive EXAM: PORTABLE CHEST 1 VIEW COMPARISON:  10/20/2019 FINDINGS: Diffuse bilateral airspace disease with basilar predominance shows mild progression. Moderate left effusion has improved in the interval. Probable small right effusion. IMPRESSION: Mild progression of diffuse bilateral airspace disease which may represent edema or pneumonia. Improvement in left pleural effusion. Electronically Signed   By: Franchot Gallo M.D.   On: 10/22/2019 09:28   [place] EKG SITE RITE  Result Date: 10/22/2019 If Site Rite image not attached, placement could not be confirmed due to current cardiac rhythm.  US Abdomen Limited RUQ  Result Date: 10/22/2019 CLINICAL DATA:  Acute right upper quadrant abdominal pain. EXAM: ULTRASOUND ABDOMEN LIMITED RIGHT UPPER QUADRANT COMPARISON:  October 19, 2019. FINDINGS: Gallbladder: No gallstones or wall thickening visualized. No sonographic Murphy sign noted by sonographer. Common bile duct: Diameter: 5 mm which is within normal limits. Liver: No focal lesion identified. Within normal limits in parenchymal echogenicity. Portal vein is patent on color Doppler imaging with normal direction of blood flow towards the liver. Other: Minimal ascites is noted. IMPRESSION: Minimal ascites. No other abnormality seen in the right upper quadrant of  the abdomen. Electronically Signed   By: Marijo Conception M.D.   On: 10/22/2019 09:03

## 2019-10-23 NOTE — Progress Notes (Signed)
  Echocardiogram 2D Echocardiogram has been performed.  Jamie [name] G Jamie [name] 10/23/2019, 3:01 PM

## 2019-10-23 NOTE — Progress Notes (Signed)
Patient ID: Jamie [name], female   DOB: 21-Sep-1960, 59 y.o.   MRN: [id number]     Advanced Heart Failure Rounding Note  PCP-Cardiologist: No primary care provider on file.   Subjective:    Patient tested positive for COVID-19. Oxygen has been titrated down to 4L [place].  Still with abdominal discomfort and feels weak.  She is not refusing meds today and BP is improved.  She remains on clevidipine gtt, titrating down.   Creatinine 1.22 => 1.66. I/Os not accurately recorded but apparently she had a lot of UOP and weight is down.    Objective:   Weight Range: 71 kg Body mass index is 27.73 kg/m.   Vital Signs:   Temp:  [97.8 F (36.6 C)-99.2 F (37.3 C)] 97.8 F (36.6 C) (03/02 0755) Pulse Rate:  [68-77] 68 (03/02 1040) Resp:  [11-22] 17 (03/02 1040) BP: (124-185)/(43-130) 131/73 (03/02 1000) SpO2:  [96 %-100 %] 98 % (03/02 1044) FiO2 (%):  [40 %-50 %] 40 % (03/02 0800) Weight:  [71 kg] 71 kg (03/02 0500) Last BM Date: 10/21/19  Weight change: Filed Weights   10/21/19 1520 10/22/19 0411 10/23/19 0500  Weight: 70.1 kg 74.3 kg 71 kg    Intake/Output:   Intake/Output Summary (Last 24 hours) at 10/23/2019 1109 Last data filed at 10/23/2019 1100 Gross per 24 hour  Intake 945.24 ml  Output 700 ml  Net 245.24 ml      Physical Exam    General: NAD Neck: JVP 10-12 cm, no thyromegaly or thyroid nodule.  Lungs: Crackles at bases.  CV: Nondisplaced PMI.  Heart regular S1/S2, no S3/S4, no murmur.  No peripheral edema.  Abdomen: Soft, nontender, no hepatosplenomegaly, no distention.  Skin: Intact without lesions or rashes.  Neurologic: Alert and oriented x 3.  Psych: Normal affect. Extremities: No clubbing or cyanosis.  HEENT: Normal.    Telemetry   NSR 70s, personally reviewed  Labs    CBC Recent Labs    10/22/19 0300 10/22/19 0300 10/22/19 1152 10/23/19 0513  WBC 6.2  --   --  2.6*  NEUTROABS  --   --   --  2.2  HGB 8.5*   < > 8.8* 8.2*  HCT 27.5*   < > 26.0*  25.7*  MCV 83.1  --   --  79.6*  PLT 179  --   --  151   < > = values in this interval not displayed.   Basic Metabolic Panel Recent Labs    10/22/19 0300 10/22/19 0300 10/22/19 1152 10/23/19 0513  NA 139   < > 138 135  K 3.7   < > 3.5 4.0  CL 104  --   --  100  CO2 22  --   --  22  GLUCOSE 147*  --   --  270*  BUN 28*  --   --  36*  CREATININE 1.22*  --   --  1.66*  CALCIUM 8.3*  --   --  8.1*  MG 1.5*  --   --  1.9   < > = values in this interval not displayed.   Liver Function Tests Recent Labs    10/21/19 1957 10/23/19 0513  AST 18 15  ALT 17 17  ALKPHOS 59 55  BILITOT 0.8 0.5  PROT 5.9* 5.4*  ALBUMIN 2.6* 2.4*   No results for input(s): LIPASE, AMYLASE in the last 72 hours. Cardiac Enzymes No results for input(s): CKTOTAL, CKMB, CKMBINDEX, TROPONINI in the  last 72 hours.  BNP: BNP (last 3 results) Recent Labs    10/21/19 1957 10/23/19 0513  BNP 1,040.2* 2,187.7*    ProBNP (last 3 results) No results for input(s): PROBNP in the last 8760 hours.   D-Dimer Recent Labs    10/22/19 1019 10/23/19 0513  DDIMER 3.75* 3.29*   Hemoglobin A1C No results for input(s): HGBA1C in the last 72 hours. Fasting Lipid Panel No results for input(s): CHOL, HDL, LDLCALC, TRIG, CHOLHDL, LDLDIRECT in the last 72 hours. Thyroid Function Tests No results for input(s): TSH, T4TOTAL, T3FREE, THYROIDAB in the last 72 hours.  Invalid input(s): FREET3  Other results:   Imaging    CT ABDOMEN PELVIS W CONTRAST  Result Date: 10/22/2019 CLINICAL DATA:  Diffuse abdominal pain and anemia. EXAM: CT ABDOMEN AND PELVIS WITH CONTRAST TECHNIQUE: Multidetector CT imaging of the abdomen and pelvis was performed using the standard protocol following bolus administration of intravenous contrast. CONTRAST:  28mL OMNIPAQUE IOHEXOL 300 MG/ML  SOLN COMPARISON:  CT scan 10/19/2019 FINDINGS: Lower chest: Persistent moderate to large bilateral pleural effusions with significant overlying  atelectasis. The heart is normal in size. No pericardial effusion. Age advanced coronary artery calcifications are noted. Hepatobiliary: No focal hepatic lesions or intrahepatic biliary dilatation. Ill-defined area of contrast enhancement and segment 7 likely a vascular shunt. The gallbladder is mildly contracted. No common bile duct dilatation. Pancreas: Scattered calcifications or likely due to prior inflammation. No worrisome pancreatic lesions or acute inflammatory process. No ductal dilatation. Spleen: Normal size. No focal lesions. Adrenals/Urinary Tract: The adrenal glands and kidneys are unremarkable. No worrisome renal lesions or hydronephrosis. The bladder appears normal. Stomach/Bowel: The stomach, duodenum, small bowel and colon are grossly normal without oral contrast. No acute inflammatory changes, mass lesions or obstructive findings. Vascular/Lymphatic: Severe/advanced atherosclerotic calcifications involving the aorta and branch vessels for age. No aneurysm or dissection. The major venous structures are patent. No mesenteric or retroperitoneal mass or adenopathy. Reproductive: The uterus and ovaries are unremarkable. Calcified fibroid again noted posteriorly. Other: Small to moderate amount of free pelvic fluid is again demonstrated. No obvious cause. Musculoskeletal: Stable compression fracture of L1. No worrisome bone lesions. Age advanced osteoporosis. IMPRESSION: 1. Persistent moderate to large bilateral pleural effusions with overlying atelectasis. 2. No acute abdominal/pelvic findings, mass lesions or adenopathy. 3. Severe/advanced atherosclerotic calcifications involving the aorta and branch vessels for age. 4. Small to moderate amount of persistent free pelvic fluid. No obvious cause. 5. Calcified uterine fibroids. Aortic Atherosclerosis (ICD10-I70.0). Electronically Signed   By: Marijo Sanes M.D.   On: 10/22/2019 13:56   DG CHEST PORT 1 VIEW  Result Date: 10/23/2019 CLINICAL DATA:   Acute respiratory insufficiency EXAM: PORTABLE CHEST 1 VIEW COMPARISON:  October 22, 2019 FINDINGS: The heart size and mediastinal contours are unchanged with mild cardiomegaly. There is a small left pleural effusion. There is slight interval improvement in the hazy/interstitial markings throughout both lungs. No acute osseous abnormality. IMPRESSION: Slight interval improvement in the bilateral hazy airspace opacities which could be due to pulmonary edema or infectious etiology. Small left pleural effusion. Electronically Signed   By: Prudencio Pair M.D.   On: 10/23/2019 06:32   [place] EKG SITE RITE  Result Date: 10/22/2019 If Site Rite image not attached, placement could not be confirmed due to current cardiac rhythm.    Medications:     Scheduled Medications: . amLODipine  10 mg Oral Daily  . aspirin EC  81 mg Oral Daily  . atorvastatin  80 mg  Oral QHS  . busPIRone  5 mg Oral Daily  . Chlorhexidine Gluconate Cloth  6 each Topical Daily  . clonazepam  0.25 mg Oral BID  . enoxaparin (LOVENOX) injection  40 mg Subcutaneous BID  . FLUoxetine  40 mg Oral Daily  . folic acid  1 mg Oral Daily  . [START ON 10/24/2019] furosemide  40 mg Intravenous BID  . hydrALAZINE  100 mg Oral Q8H  . insulin aspart  0-15 Units Subcutaneous TID WC  . insulin glargine  20 Units Subcutaneous QHS  . labetalol  300 mg Oral BID  . methylPREDNISolone (SOLU-MEDROL) injection  40 mg Intravenous Q24H  . multivitamin with minerals  1 tablet Oral Daily  . nitroGLYCERIN  1 inch Topical Q6H  . pantoprazole (PROTONIX) IV  40 mg Intravenous Q12H  . thiamine  100 mg Oral Daily    Infusions: . clevidipine 2 mg/hr (10/23/19 1100)  . remdesivir 100 mg in NS 100 mL 200 mL/hr at 10/23/19 1100    PRN Medications: acetaminophen, albuterol, ALPRAZolam, hydrALAZINE, HYDROcodone-acetaminophen, ipratropium-albuterol, morphine injection, nitroGLYCERIN, ondansetron (ZOFRAN) IV   Assessment/Plan   1. Acute hypoxemic respiratory  failure: COVID-19 viral pneumonia + CHF exacerbation.  Weight is down with diuresis, titrating down oxygen. - Started on steroids and remdesivir for COVID-19.  - Continue IV Lasix.  2. Acute on chronic diastolic CHF: Last echo at [facility] 2/21 with EF 55-60%, restrictive diastolic function.  Creatinine 1.22 => 1.66.  Multiple admissions with HTN and CHF exacerbation.  She looks volume overloaded still on exam, think this is contributing as well as COVID-19 to her respiratory failure. Weight is down, suspect good diuresis though I/Os not recorded.  She is still short of breath though oxygen requirement is now less.  - Would like PICC line placement to follow CVP, avoid overshooting diuresis with rising creatinine.  - Still looks volume overloaded, would continue Lasix 80 mg IV bid today.   - May eventually benefit from Cardiomems placement.  - Plan for eventual RHC/LHC when she is stabilized and can lie flat.   - I would like to get an echo done here to reassess RV, PA pressure, etc.  3. Pleural effusions: Bilateral.  Had [facility]acenteses [facility]. Transudates with negative cytology. Suspect due to CHF.  CT done 3/1 showed persistent moderate-large effusions.  4. HTN: Poorly controlled, admitted with hypertensive emergency/CHF. Now that she is not refusing po meds, control is better.  - Titrate off clevidipine gtt.   - Continue amlodipine 10 mg daily.  - Continue hydralazine 100 mg tid. - Continue labetalol 300 bid - Hold off on ACEI/ARB with recent AKI.  5. AKI: Creatinine to 1.66 with diuresis.  Still volume overloaded, continue IV Lasix today but follow CVP for guidance.  Follow creatinine closely.  6. Abdominal pain: CT abdomen earlier in admission with 1.5 cm liver lesion but no other significant abdominal abnormality.  She has had abdominal pain throughout this admission.  CT abdomen 3/1 showed no acute abdominal findings.  7. COPD: Per report on 3L home oxygen.  8. DM: SSI,  per [facility].   Length of Stay: 2  Loralie Champagne, MD  10/23/2019, 11:09 AM  Advanced Heart Failure Team Pager [phone] (M-F; 7a - 4p)  Please contact [facility] for night-coverage after hours (4p -7a ) and weekends on [email or web address]

## 2019-10-23 NOTE — Progress Notes (Signed)
IV team called and wanted to make sure pt consented to having PICC line placed before they came. Pt was asked and she said she "doesn't want to get her nerves worked up [employer] and would like to do it tomorrow." Pt educated on importance of PICC line placement and still refuses to have it inserted tonight, but says she is agreeable to having it placed tomorrow. Will continue to monitor.

## 2019-10-24 LAB — BODY FLUID CELL COUNT WITH DIFFERENTIAL
Eos, Fluid: 0 %
Lymphs, Fluid: 8 %
Monocyte-Macrophage-Serous Fluid: 88 % (ref 50–90)
Neutrophil Count, Fluid: 4 % (ref 0–25)
Total Nucleated Cell Count, Fluid: 80 cu mm (ref 0–1000)

## 2019-10-24 LAB — CBC WITH DIFFERENTIAL/PLATELET
Abs Immature Granulocytes: 0.04 10*3/uL (ref 0.00–0.07)
Basophils Absolute: 0 10*3/uL (ref 0.0–0.1)
Basophils Relative: 0 %
Eosinophils Absolute: 0 10*3/uL (ref 0.0–0.5)
Eosinophils Relative: 0 %
HCT: 27.6 % — ABNORMAL LOW (ref 36.0–46.0)
Hemoglobin: 8.8 g/dL — ABNORMAL LOW (ref 12.0–15.0)
Immature Granulocytes: 1 %
Lymphocytes Relative: 6 %
Lymphs Abs: 0.2 10*3/uL — ABNORMAL LOW (ref 0.7–4.0)
MCH: 25.5 pg — ABNORMAL LOW (ref 26.0–34.0)
MCHC: 31.9 g/dL (ref 30.0–36.0)
MCV: 80 fL (ref 80.0–100.0)
Monocytes Absolute: 0.2 10*3/uL (ref 0.1–1.0)
Monocytes Relative: 4 %
Neutro Abs: 3.1 10*3/uL (ref 1.7–7.7)
Neutrophils Relative %: 89 %
Platelets: 173 10*3/uL (ref 150–400)
RBC: 3.45 MIL/uL — ABNORMAL LOW (ref 3.87–5.11)
RDW: 13.9 % (ref 11.5–15.5)
WBC: 3.5 10*3/uL — ABNORMAL LOW (ref 4.0–10.5)
nRBC: 0 % (ref 0.0–0.2)

## 2019-10-24 LAB — PROTEIN, PLEURAL OR PERITONEAL FLUID: Total protein, fluid: <3 g/dL

## 2019-10-24 LAB — GRAM STAIN

## 2019-10-24 LAB — COMPREHENSIVE METABOLIC PANEL
ALT: 17 U/L (ref 0–44)
AST: 17 U/L (ref 15–41)
Albumin: 2.4 g/dL — ABNORMAL LOW (ref 3.5–5.0)
Alkaline Phosphatase: 54 U/L (ref 38–126)
Anion gap: 13 (ref 5–15)
BUN: 51 mg/dL — ABNORMAL HIGH (ref 6–20)
CO2: 23 mmol/L (ref 22–32)
Calcium: 8.1 mg/dL — ABNORMAL LOW (ref 8.9–10.3)
Chloride: 97 mmol/L — ABNORMAL LOW (ref 98–111)
Creatinine, Ser: 2.41 mg/dL — ABNORMAL HIGH (ref 0.44–1.00)
GFR calc Af Amer: 25 mL/min — ABNORMAL LOW (ref >60)
GFR calc non Af Amer: 21 mL/min — ABNORMAL LOW (ref >60)
Glucose, Bld: 281 mg/dL — ABNORMAL HIGH (ref 70–99)
Potassium: 4.1 mmol/L (ref 3.5–5.1)
Sodium: 133 mmol/L — ABNORMAL LOW (ref 135–145)
Total Bilirubin: 0.4 mg/dL (ref 0.3–1.2)
Total Protein: 5.5 g/dL — ABNORMAL LOW (ref 6.5–8.1)

## 2019-10-24 LAB — GLUCOSE, CAPILLARY
Glucose-Capillary: 221 mg/dL — ABNORMAL HIGH (ref 70–99)
Glucose-Capillary: 283 mg/dL — ABNORMAL HIGH (ref 70–99)
Glucose-Capillary: 188 mg/dL — ABNORMAL HIGH (ref 70–99)
Glucose-Capillary: 268 mg/dL — ABNORMAL HIGH (ref 70–99)

## 2019-10-24 LAB — LACTATE DEHYDROGENASE, PLEURAL OR PERITONEAL FLUID: LD, Fluid: 83 U/L — ABNORMAL HIGH (ref 3–23)

## 2019-10-24 LAB — BRAIN NATRIURETIC PEPTIDE: B Natriuretic Peptide: 1140.9 pg/mL — ABNORMAL HIGH (ref 0.0–100.0)

## 2019-10-24 LAB — D-DIMER, QUANTITATIVE: D-Dimer, Quant: 3.41 ug/mL-FEU — ABNORMAL HIGH (ref 0.00–0.50)

## 2019-10-24 LAB — PROCALCITONIN: Procalcitonin: 0.32 ng/mL

## 2019-10-24 LAB — MAGNESIUM: Magnesium: 1.8 mg/dL (ref 1.7–2.4)

## 2019-10-24 LAB — C-REACTIVE PROTEIN: CRP: 3.3 mg/dL — ABNORMAL HIGH (ref <1.0)

## 2019-10-24 MED ORDER — ISOSORBIDE MONONITRATE ER 60 MG PO TB24
60.0000 mg | ORAL_TABLET | Freq: Every day | ORAL | Status: DC
  Administered 2019-10-24 – 2019-11-06 (×13): 60 mg via ORAL
  Filled 2019-10-24 (×5): qty 1
  Filled 2019-10-24: qty 2
  Filled 2019-10-24 (×7): qty 1

## 2019-10-24 MED ORDER — SODIUM CHLORIDE 0.9% FLUSH
10.0000 mL | INTRAVENOUS | Status: DC | PRN
  Administered 2019-10-26: 10 mL

## 2019-10-24 MED ORDER — LABETALOL HCL 200 MG PO TABS
300.0000 mg | ORAL_TABLET | Freq: Two times a day (BID) | ORAL | Status: DC
  Administered 2019-10-24 – 2019-11-04 (×22): 300 mg via ORAL
  Filled 2019-10-24 (×22): qty 1

## 2019-10-24 MED ORDER — HYDRALAZINE HCL 50 MG PO TABS
100.0000 mg | ORAL_TABLET | Freq: Three times a day (TID) | ORAL | Status: DC
  Administered 2019-10-24 – 2019-11-06 (×37): 100 mg via ORAL
  Filled 2019-10-24 (×39): qty 2

## 2019-10-24 MED ORDER — LIDOCAINE HCL (PF) 1 % IJ SOLN
INTRAMUSCULAR | Status: AC
  Filled 2019-10-24: qty 5

## 2019-10-24 MED ORDER — FUROSEMIDE 10 MG/ML IJ SOLN: 40.0000 mg | Freq: Two times a day (BID) | INTRAMUSCULAR | Status: DC

## 2019-10-24 MED ORDER — CLONIDINE HCL 0.1 MG PO TABS
0.1000 mg | ORAL_TABLET | Freq: Two times a day (BID) | ORAL | Status: DC
  Administered 2019-10-24 – 2019-10-25 (×3): 0.1 mg via ORAL
  Filled 2019-10-24 (×3): qty 1

## 2019-10-24 MED ORDER — HYDROCODONE-ACETAMINOPHEN 5-325 MG PO TABS
1.0000 | ORAL_TABLET | Freq: Four times a day (QID) | ORAL | Status: DC | PRN
  Administered 2019-10-24 – 2019-11-05 (×6): 1 via ORAL
  Filled 2019-10-24 (×6): qty 1

## 2019-10-24 MED ORDER — AMLODIPINE BESYLATE 10 MG PO TABS
10.0000 mg | ORAL_TABLET | Freq: Every day | ORAL | Status: DC
  Administered 2019-10-24 – 2019-11-06 (×13): 10 mg via ORAL
  Filled 2019-10-24 (×13): qty 1

## 2019-10-24 MED ORDER — PANTOPRAZOLE SODIUM 40 MG PO TBEC
40.0000 mg | DELAYED_RELEASE_TABLET | Freq: Every day | ORAL | Status: DC
  Administered 2019-10-24 – 2019-11-06 (×13): 40 mg via ORAL
  Filled 2019-10-24 (×13): qty 1

## 2019-10-24 MED ORDER — SODIUM CHLORIDE 0.9% FLUSH
10.0000 mL | Freq: Two times a day (BID) | INTRAVENOUS | Status: DC
  Administered 2019-10-24: 10 mL

## 2019-10-24 NOTE — Progress Notes (Signed)
Patient ID: Jamie [name], female   DOB: [birth year], 60 y.o.   MRN: [id number]     Advanced Heart Failure Rounding Note  PCP-Cardiologist: No primary care provider on file.   Subjective:    Patient tested positive for COVID-19. Oxygen has been titrated down to 2L [place].  She seems to be better symptomatically.  She is off clevidipine.  She refused PICC again yesterday.  BP remains high.   Creatinine 1.22 => 1.66 => 2.41.  I/Os not accurate.  Weight down another pound.    Objective:   Weight Range: 70.4 kg Body mass index is 27.49 kg/m.   Vital Signs:   Temp:  [97.8 F (36.6 C)-98.6 F (37 C)] 98.6 F (37 C) (03/03 0800) Pulse Rate:  [64-73] 72 (03/03 0900) Resp:  [11-25] 14 (03/03 0900) BP: (124-171)/(54-101) 160/67 (03/03 0900) SpO2:  [90 %-100 %] 97 % (03/03 0900) Weight:  [70.4 kg] 70.4 kg (03/03 0500) Last BM Date: 10/23/19  Weight change: Filed Weights   10/22/19 0411 10/23/19 0500 10/24/19 0500  Weight: 74.3 kg 71 kg 70.4 kg    Intake/Output:   Intake/Output Summary (Last 24 hours) at 10/24/2019 0937 Last data filed at 10/24/2019 0800 Gross per 24 hour  Intake 1051.95 ml  Output 970 ml  Net 81.95 ml      Physical Exam    General: NAD Neck: JVP 10-11 cm, no thyromegaly or thyroid nodule.  Lungs: Clear to auscultation bilaterally with normal respiratory effort. CV: Nondisplaced PMI.  Heart regular S1/S2, no S3/S4, no murmur.  No peripheral edema.   Abdomen: Soft, nontender, no hepatosplenomegaly, no distention.  Skin: Intact without lesions or rashes.  Neurologic: Alert and oriented x 3.  Psych: Normal affect. Extremities: No clubbing or cyanosis.  HEENT: Normal.    Telemetry   NSR 70s, personally reviewed  Labs    CBC Recent Labs    10/23/19 0513 10/24/19 0354  WBC 2.6* 3.5*  NEUTROABS 2.2 3.1  HGB 8.2* 8.8*  HCT 25.7* 27.6*  MCV 79.6* 80.0  PLT 151 785   Basic Metabolic Panel Recent Labs    10/23/19 0513 10/24/19 0354  NA 135 133*  K  4.0 4.1  CL 100 97*  CO2 22 23  GLUCOSE 270* 281*  BUN 36* 51*  CREATININE 1.66* 2.41*  CALCIUM 8.1* 8.1*  MG 1.9 1.8   Liver Function Tests Recent Labs    10/23/19 0513 10/24/19 0354  AST 15 17  ALT 17 17  ALKPHOS 55 54  BILITOT 0.5 0.4  PROT 5.4* 5.5*  ALBUMIN 2.4* 2.4*   No results for input(s): LIPASE, AMYLASE in the last 72 hours. Cardiac Enzymes No results for input(s): CKTOTAL, CKMB, CKMBINDEX, TROPONINI in the last 72 hours.  BNP: BNP (last 3 results) Recent Labs    10/21/19 1957 10/23/19 0513 10/24/19 0354  BNP 1,040.2* 2,187.7* 1,140.9*    ProBNP (last 3 results) No results for input(s): PROBNP in the last 8760 hours.   D-Dimer Recent Labs    10/23/19 0513 10/24/19 0354  DDIMER 3.29* 3.41*   Hemoglobin A1C Recent Labs    10/23/19 0503  HGBA1C 6.9*   Fasting Lipid Panel No results for input(s): CHOL, HDL, LDLCALC, TRIG, CHOLHDL, LDLDIRECT in the last 72 hours. Thyroid Function Tests Recent Labs    10/22/19 1019  TSH 6.661*    Other results:   Imaging    ECHOCARDIOGRAM COMPLETE  Result Date: 10/23/2019    ECHOCARDIOGRAM REPORT   Patient Name:  Jamie [name]  Date of Exam: 10/23/2019 Medical Rec #:  [id number]  Height:       63.0 in Accession #:    [id number] Weight:       156.5 lb Date of Birth:  07-01-1961 BSA:          1.742 m Patient Age:    73 years   BP:           124/65 mmHg Patient Gender: F          HR:           66 bpm. Exam Location:  Inpatient Procedure: 2D Echo, Cardiac Doppler and Color Doppler Indications:    I50.33 Acute on chronic diastolic (congestive) heart failure  History:        Patient has no prior history of Echocardiogram examinations.                 COPD, TIA and PAD; Risk Factors:Hypertension and Diabetes.                 COVID-19 Positive.  Sonographer:    Jonelle Sidle Dance Referring Phys: [place]  1. Left ventricular ejection fraction, by estimation, is 55 to 60%. The left ventricle has normal  function. The left ventricle has no regional wall motion abnormalities. Left ventricular diastolic parameters are consistent with Grade II diastolic dysfunction (pseudonormalization). Elevated left atrial pressure.  2. Right ventricular systolic function is normal. The right ventricular size is normal. Tricuspid regurgitation signal is inadequate for assessing PA pressure.  3. Left atrial size was moderately dilated.  4. Large pleural effusion in the left lateral region.  5. The mitral valve is normal in structure and function. Mild mitral valve regurgitation.  6. The aortic valve is normal in structure and function. Aortic valve regurgitation is not visualized.  7. The inferior vena cava is dilated in size with >50% respiratory variability, suggesting right atrial pressure of 8 mmHg. Comparison(s): No prior Echocardiogram. FINDINGS  Left Ventricle: Left ventricular ejection fraction, by estimation, is 55 to 60%. The left ventricle has normal function. The left ventricle has no regional wall motion abnormalities. The left ventricular internal cavity size was normal in size. There is  no left ventricular hypertrophy. Left ventricular diastolic parameters are consistent with Grade II diastolic dysfunction (pseudonormalization). Elevated left atrial pressure. Right Ventricle: The right ventricular size is normal. No increase in right ventricular wall thickness. Right ventricular systolic function is normal. Tricuspid regurgitation signal is inadequate for assessing PA pressure. Left Atrium: Left atrial size was moderately dilated. Right Atrium: Right atrial size was normal in size. Pericardium: There is no evidence of pericardial effusion. Mitral Valve: The mitral valve is normal in structure and function. Mild mitral annular calcification. Mild mitral valve regurgitation. Tricuspid Valve: The tricuspid valve is normal in structure. Tricuspid valve regurgitation is not demonstrated. Aortic Valve: The aortic valve is  normal in structure and function. Aortic valve regurgitation is not visualized. Pulmonic Valve: The pulmonic valve was grossly normal. Pulmonic valve regurgitation is not visualized. Aorta: The aortic root and ascending aorta are structurally normal, with no evidence of dilitation. Venous: The inferior vena cava is dilated in size with greater than 50% respiratory variability, suggesting right atrial pressure of 8 mmHg. IAS/Shunts: No atrial level shunt detected by color flow Doppler. Additional Comments: There is a large pleural effusion in the left lateral region.  LEFT VENTRICLE PLAX 2D LVIDd:         3.79 cm  Diastology LVIDs:         2.87 cm  LV e' lateral:   6.00 cm/s LV PW:         1.27 cm  LV E/e' lateral: 18.3 LV IVS:        0.94 cm  LV e' medial:    3.57 cm/s LVOT diam:     1.80 cm  LV E/e' medial:  30.8 LV SV:         52 LV SV Index:   30 LVOT Area:     2.54 cm  RIGHT VENTRICLE            IVC RV Basal diam:  3.24 cm    IVC diam: 2.47 cm RV Mid diam:    1.67 cm RV S prime:     9.32 cm/s TAPSE (M-mode): 1.8 cm LEFT ATRIUM             Index       RIGHT ATRIUM           Index LA diam:        3.90 cm 2.24 cm/m  RA Area:     15.50 cm LA Vol (A2C):   79.2 ml 45.46 ml/m RA Volume:   42.40 ml  24.33 ml/m LA Vol (A4C):   61.2 ml 35.12 ml/m LA Biplane Vol: 73.7 ml 42.30 ml/m  AORTIC VALVE LVOT Vmax:   89.80 cm/s LVOT Vmean:  62.000 cm/s LVOT VTI:    0.204 m  AORTA Ao Root diam: 2.80 cm Ao Asc diam:  2.40 cm MITRAL VALVE MV Area (PHT): 3.72 cm     SHUNTS MV Decel Time: 204 msec     Systemic VTI:  0.20 m MV E velocity: 110.00 cm/s  Systemic Diam: 1.80 cm MV A velocity: 54.30 cm/s MV E/A ratio:  2.03 Mihai Croitoru MD Electronically signed by Sanda Klein MD Signature Date/Time: 10/23/2019/3:50:43 PM    Final      Medications:     Scheduled Medications: . amLODipine  10 mg Oral Daily  . aspirin EC  81 mg Oral Daily  . atorvastatin  80 mg Oral QHS  . busPIRone  5 mg Oral Daily  . Chlorhexidine  Gluconate Cloth  6 each Topical Daily  . clonazepam  0.25 mg Oral BID  . enoxaparin (LOVENOX) injection  40 mg Subcutaneous BID  . FLUoxetine  40 mg Oral Daily  . folic acid  1 mg Oral Daily  . hydrALAZINE  100 mg Oral Q8H  . insulin aspart  0-15 Units Subcutaneous TID WC  . insulin glargine  20 Units Subcutaneous QHS  . isosorbide mononitrate  60 mg Oral Daily  . labetalol  300 mg Oral BID  . methylPREDNISolone (SOLU-MEDROL) injection  40 mg Intravenous Q24H  . multivitamin with minerals  1 tablet Oral Daily  . pantoprazole (PROTONIX) IV  40 mg Intravenous Q12H  . thiamine  100 mg Oral Daily    Infusions: . remdesivir 100 mg in NS 100 mL 100 mg (10/24/19 0930)    PRN Medications: acetaminophen, albuterol, ALPRAZolam, hydrALAZINE, HYDROcodone-acetaminophen, ipratropium-albuterol, morphine injection, nitroGLYCERIN, ondansetron (ZOFRAN) IV   Assessment/Plan   1. Acute hypoxemic respiratory failure: COVID-19 viral pneumonia + CHF exacerbation.  Weight is down with diuresis, titrating down oxygen. - Started on steroids and remdesivir for COVID-19.  - Hold Lasix today with rise in creatinine.  2. Acute on chronic diastolic CHF: Last echo at [facility] 2/21 with EF 55-60%, restrictive diastolic function.  Echo here  with EF 55-60%, normal RV, IVC suggesting RA pressure 8 mmHg. Creatinine 1.22 => 1.66 => 2.4.  Multiple admissions with HTN and CHF exacerbation.  Weight is down, still looks like she has some volume overload with JVD.   - Would like PICC line placement to follow CVP and get more accurate assessment of volume.  She agrees for PICC today.  - Hold Lasix for now with rise in creatinine, will follow CVP to help guide future diuresis.    - May eventually benefit from Cardiomems placement.  - Plan for eventual RHC/LHC when she is stabilized => will need creatinine to trend back down first.   3. Pleural effusions: Bilateral.  Had [facility]acenteses [facility] admission at [facility]. Transudates  with negative cytology. Suspect due to CHF.  CT done 3/1 showed persistent moderate-large effusions.  - Discussed with pulmonary repeat thoracentesis, she is agreeing to this currently.  4. HTN: Poorly controlled, admitted with hypertensive emergency/CHF. She is now off clevidipine gtt, BP still running high.  - Continue amlodipine 10 mg daily.  - Continue hydralazine 100 mg tid. - Continue labetalol 300 bid - Add clonidine 0.1 bid.  - Hold off on ACEI/ARB with AKI.  5. AKI: Creatinine to 2.4 with diuresis.  Hold Lasix.  6. Abdominal pain: CT abdomen earlier in admission with 1.5 cm liver lesion but no other significant abdominal abnormality.  She has had abdominal pain throughout this admission.  CT abdomen 3/1 showed no acute abdominal findings.  7. COPD: Per report on 3L home oxygen.  8. DM: SSI, per [facility].   Length of Stay: 3  Loralie Champagne, MD  10/24/2019, 9:37 AM  Advanced Heart Failure Team Pager [phone] (M-F; 7a - 4p)  Please contact [facility] for night-coverage after hours (4p -7a ) and weekends on [email or web address]

## 2019-10-24 NOTE — Procedures (Signed)
Thoracentesis Procedure Note  Pre-operative Diagnosis: Right pleural effusion  Post-operative Diagnosis: normal  Indications: Large right pleural effusion  Procedure Details  Consent: Informed consent was obtained. Risks of the procedure were discussed including: infection, bleeding, pain, pneumothorax.  Under sterile conditions the patient was positioned. Betadine solution and sterile drapes were utilized. 1% plain lidocaine was used to anesthetize the 8 rib space. Fluid was obtained without any difficulties and minimal blood loss.  A dressing was applied to the wound and wound care instructions were provided.   Findings 750 ml of clear pleural fluid was obtained. A sample was sent to Pathology for cytogenetics, flow, and cell counts, as well as for infection analysis.  Complications:  None; patient tolerated the procedure well.          Condition: stable  Plan A follow up chest x-ray was ordered. Bed Rest for 0 hours. Tylenol 650 mg. for pain.  Attending Attestation: I performed the procedure.

## 2019-10-24 NOTE — Progress Notes (Signed)
PROGRESS NOTE                                                                                                                                                                                                             Patient Demographics:    Jamie [name], is a 59 y.o. 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    She was recently hospitalized twice to [facility] for shortness of breath, her work-up showed bilateral transudative pleural effusion, she had thoracentesis suggesting transudative fluid with no malignant cells.  Reason for recurrent effusion was thought to be acute on chronic diastolic CHF.  She was also found to have liver mass.  She was finally transferred to [facility] for cardiology evaluation for recurrent CHF.   Subjective:   Patient in bed, appears comfortable, denies any headache, no fever, no chest pain or pressure, no shortness of breath , no abdominal pain. No focal weakness.    Assessment  & Plan :    1.  Severe Acute hypoxic respiratory failure due to recurrent Acute on chronic diastolic CHF EF 15% .  Recurrent bilateral pleural effusions which are transudate if worked up in [place], EF 60%.  She was in severe respiratory distress morning of 2019/11/18 and almost coded, he also had evidence of hypertensive urgency likely due to respiratory distress.    She was started on high-dose IV Lasix, IV nitro drip  and blood pressure medications, she has diuresed very well although she had incontinence and output was not recorded, respiratory distress is much improved on 10/23/2019 that is 24 hours after the treatment regimen.  She will be continued on oral beta-blocker, hydralazine, Nitropaste and Lasix ( held 3/3/ due to AKI)combination.  Cardiology on board Case discussed with cardiologist Dr. Aundra Dubin on 10/23/2019.  Plan is for  left heart cath once she clinically stabilizes. For now continue to titrate down oxygen and increase activity as tolerated.  2. Acute Covid 19 Viral Pneumonitis during the ongoing 2020 Covid 19 Pandemic - her main disease process and hypoxia is due to #1 above which is acute on chronic diastolic CHF.  Her Covid disease itself seems to be mild to moderate for which I will place her on low-dose steroids and remdesivir.  Monitor closely.  Encouraged the patient to sit up in chair in the daytime use I-S and flutter valve for pulmonary toiletry and then prone in bed when at night.  SpO2: 98 % O2 Flow Rate (L/min): 2 L/min FiO2 (%): 40 %  Recent Labs  Lab 10/21/19 1839 10/21/19 1957 10/22/19 1019 10/23/19 0513 10/24/19 0354  CRP  --   --  6.0* 6.5* 3.3*  DDIMER  --   --  3.75* 3.29* 3.41*  FERRITIN  --   --  177  --   --   BNP  --  1,040.2*  --  2,187.7* 1,140.9*  PROCALCITON  --  0.20 0.22 0.28 0.32  SARSCOV2NAA POSITIVE*  --   --   --   --     Hepatic Function Latest Ref Rng & Units 10/24/2019 10/23/2019 10/21/2019  Total Protein 6.5 - 8.1 g/dL 5.5(L) 5.4(L) 5.9(L)  Albumin 3.5 - 5.0 g/dL 2.4(L) 2.4(L) 2.6(L)  AST 15 - 41 U/L 17 15 18   ALT 0 - 44 U/L 17 17 17   Alk Phosphatase 38 - 126 U/L 54 55 59  Total Bilirubin 0.3 - 1.2 mg/dL 0.4 0.5 0.8      3.  PAD.  S/p right BKA, CT evidence of abdominal diffuse calcification with recurrent abdominal pain.  Highly questionable for ischemic bowel, for now continue aspirin and statin for secondary prevention, clear liquid diet and monitor.   She likely has diffuse atherosclerosis due to uncontrolled diabetes mellitus and hypertension.  She denies any history of smoking, with supportive care she is somewhat better and tolerating liquid diet.  4.  Hypertensive crisis. Stopped  IV Clevidipine drip, oral medications adjusted and as needed IV hydralazine added.  Currently on combination of beta-blocker, Norvasc, clonidine, hydralazine along with Imdur.   5.  GERD with nausea.  Also has HX of diabetic gastroparesis.  For now supportive care with clear liquid diet, PPI and nausea medications.   6.  Dyslipidemia.  Continue statin.  7.  Anemia of chronic disease.  Monitor.  8.  Hypertensive urgency.  Resolved.  9.  Depression and anxiety.  Continue combination of bupropion, fluoxetine, buspirone and Xanax.  10.  AKI at [facility] with creatinine of 1.5.  Renal function worse likely due to combination of IV contrast from CT and diuresis, Lasix kept on 10/24/2019, good urine output will monitor, currently has Foley.  11. Liver mass.  Needs abdominal MRI once pulmonary status is improved.  12. DM2 - lantus + ISS  Lab Results  Component Value Date   HGBA1C 6.9 (H) 10/23/2019     CBG (last 3)  Recent Labs    10/23/19 1914 10/23/19 2122 10/24/19 0808  GLUCAP 177* 258* 221*     Condition - Extremely Guarded  Family Communication  :  Daughter in law RN on 10/22/19 @ [phone], called on 10/23/2019 at 10:20 AM - full mailbox.  Code Status :  Full  Diet :   Diet Order            Diet Heart Room service  appropriate? Yes; Fluid consistency: Thin; Fluid restriction: 1200 mL Fluid  Diet effective now               Disposition Plan  : PCU currently awaiting renal function to improve for left heart cath, has recurrent flash pulmonary edema indicating underlying cardiac etiology.  Consults  : Pulmonary, cardiology  Procedures  :    CT - 1. Persistent moderate to large bilateral pleural effusions with overlying atelectasis.  2. No acute abdominal/pelvic findings, mass lesions or adenopathy. 3. Severe/advanced atherosclerotic calcifications involving the aorta and branch vessels for age. 4. Small to moderate amount of persistent free pelvic fluid. No obvious cause. 5. Calcified uterine fibroids. Aortic Atherosclerosis.  TTE from [facility] - EF 60%, dCHF  PICC line and Foley  requested.  PUD Prophylaxis : PPI  DVT Prophylaxis  :  Lovenox    Lab Results  Component Value Date   PLT 173 10/24/2019    Inpatient Medications  Scheduled Meds: . amLODipine  10 mg Oral Daily  . aspirin EC  81 mg Oral Daily  . atorvastatin  80 mg Oral QHS  . busPIRone  5 mg Oral Daily  . Chlorhexidine Gluconate Cloth  6 each Topical Daily  . clonazepam  0.25 mg Oral BID  . cloNIDine  0.1 mg Oral BID  . enoxaparin (LOVENOX) injection  40 mg Subcutaneous BID  . FLUoxetine  40 mg Oral Daily  . folic acid  1 mg Oral Daily  . hydrALAZINE  100 mg Oral Q8H  . insulin aspart  0-15 Units Subcutaneous TID WC  . insulin glargine  20 Units Subcutaneous QHS  . isosorbide mononitrate  60 mg Oral Daily  . labetalol  300 mg Oral BID  . methylPREDNISolone (SOLU-MEDROL) injection  40 mg Intravenous Q24H  . multivitamin with minerals  1 tablet Oral Daily  . pantoprazole (PROTONIX) IV  40 mg Intravenous Q12H  . thiamine  100 mg Oral Daily   Continuous Infusions: . remdesivir 100 mg in NS 100 mL Stopped (10/24/19 1000)   PRN Meds:.acetaminophen, albuterol, ALPRAZolam, hydrALAZINE, HYDROcodone-acetaminophen, ipratropium-albuterol, morphine injection, nitroGLYCERIN, ondansetron (ZOFRAN) IV  Antibiotics  :    Anti-infectives (From admission, onward)   Start     Dose/Rate Route Frequency Ordered Stop   10/23/19 1000  remdesivir 100 mg in sodium chloride 0.9 % 100 mL IVPB     100 mg 200 mL/hr over 30 Minutes Intravenous Daily 10/22/19 1713 10/27/19 0959   10/22/19 1800  remdesivir 200 mg in sodium chloride 0.9% 250 mL IVPB     200 mg 580 mL/hr  over 30 Minutes Intravenous Once 10/22/19 1713 10/22/19 1843   10/21/19 2100  cefTRIAXone (ROCEPHIN) 1 g in sodium chloride 0.9 % 100 mL IVPB  Status:  Discontinued     1 g 200 mL/hr over 30 Minutes Intravenous Every 24 hours 10/21/19 1744 10/22/19 0748   10/21/19 2100  azithromycin (ZITHROMAX) tablet 500 mg  Status:  Discontinued     500 mg Oral Daily 10/21/19 1744 10/22/19 0748       Time Spent in minutes  30   Lala Lund M.D on 10/24/2019 at 10:30 AM  To page go to [email or web address] - password [facility]  [facility] -  Office  [phone]     See all Orders from today for further details    Objective:   Vitals:   10/24/19 0700 10/24/19 0800 10/24/19 0900 10/24/19 1000  BP: (!) 168/71 (!) 170/74 (!) 160/67 (!) 153/62  Pulse:  69 71 72 69  Resp: 13 12 14 14   Temp:  98.6 F (37 C)    TempSrc:  Oral    SpO2: 98% 98% 97% 98%  Weight:      Height:        Wt Readings from Last 3 Encounters:  10/24/19 70.4 kg     Intake/Output Summary (Last 24 hours) at 10/24/2019 1030 Last data filed at 10/24/2019 1010 Gross per 24 hour  Intake 1386.81 ml  Output 970 ml  Net 416.81 ml     Physical Exam  Awake Alert, No new F.N deficits, Normal affect [place].AT,PERRAL Supple Neck,No JVD, No cervical lymphadenopathy appriciated.  Symmetrical Chest wall movement, Good air movement bilaterally, CTAB RRR,No Gallops, Rubs or new Murmurs, No Parasternal Heave +ve B.Sounds, Abd Soft, No tenderness, No organomegaly appriciated, No rebound - guarding or rigidity. No Cyanosis, R. BKA     Data Review:    CBC Recent Labs  Lab 10/21/19 1957 10/22/19 0300 10/22/19 1152 10/23/19 0513 10/24/19 0354  WBC 8.0 6.2  --  2.6* 3.5*  HGB 8.9* 8.5* 8.8* 8.2* 8.8*  HCT 27.8* 27.5* 26.0* 25.7* 27.6*  PLT 176 179  --  151 173  MCV 80.8 83.1  --  79.6* 80.0  MCH 25.9* 25.7*  --  25.4* 25.5*  MCHC 32.0 30.9  --  31.9 31.9  RDW 14.0 14.1  --  13.6 13.9  LYMPHSABS  --   --   --  0.2* 0.2*    MONOABS  --   --   --  0.2 0.2  EOSABS  --   --   --  0.0 0.0  BASOSABS  --   --   --  0.0 0.0    Chemistries  Recent Labs  Lab 10/21/19 1957 10/22/19 0300 10/22/19 1152 10/23/19 0513 10/24/19 0354  NA 137 139 138 135 133*  K 3.7 3.7 3.5 4.0 4.1  CL 102 104  --  100 97*  CO2 25 22  --  22 23  GLUCOSE 214* 147*  --  270* 281*  BUN 33* 28*  --  36* 51*  CREATININE 1.22* 1.22*  --  1.66* 2.41*  CALCIUM 8.3* 8.3*  --  8.1* 8.1*  MG  --  1.5*  --  1.9 1.8  AST 18  --   --  15 17  ALT 17  --   --  17 17  ALKPHOS 59  --   --  55 54  BILITOT 0.8  --   --  0.5 0.4   ------------------------------------------------------------------------------------------------------------------ No results for input(s): CHOL, HDL, LDLCALC, TRIG, CHOLHDL, LDLDIRECT in the last 72 hours.  Lab Results  Component Value Date   HGBA1C 6.9 (H) 10/23/2019   ------------------------------------------------------------------------------------------------------------------ Recent Labs    10/22/19 1019  TSH 6.661*    Cardiac Enzymes No results for input(s): CKMB, TROPONINI, MYOGLOBIN in the last 168 hours.  Invalid input(s): CK ------------------------------------------------------------------------------------------------------------------    Component Value Date/Time   BNP 1,140.9 (H) 10/24/2019 0354    Micro Results Recent Results (from the past 240 hour(s))  SARS CORONAVIRUS 2 (TAT 6-24 HRS) Nasopharyngeal Nasopharyngeal Swab     Status: Abnormal   Collection Time: 10/21/19  6:39 PM   Specimen: Nasopharyngeal Swab  Result Value Ref Range Status   SARS Coronavirus 2 POSITIVE (A) NEGATIVE Final    Comment: RESULT CALLED TO, READ BACK BY AND VERIFIED WITH: K. AJRDY,RN 0017 10/22/2019 T. TYSOR (NOTE) SARS-CoV-2 target nucleic acids are DETECTED.  The SARS-CoV-2 RNA is generally detectable in upper and lower respiratory specimens during the acute phase of infection. Positive results are  indicative of the presence of SARS-CoV-2 RNA. Clinical correlation with patient history and other diagnostic information is  necessary to determine patient infection status. Positive results do not rule out bacterial infection or co-infection with other viruses.  The expected result is Negative. Fact Sheet for Patients: SugarRoll.be Fact Sheet for Healthcare Providers: https://www.woods-mathews.com/ This test is not yet approved or cleared by the [place] FDA and  has been authorized for detection and/or diagnosis of SARS-CoV-2 by FDA under an Emergency Use Authorization (EUA). This EUA will remain  in effect (meaning this test can be used) for th e duration of the COVID-19 declaration under Section 564(b)(1) of the Act, 21 U.S.C. section 360bbb-3(b)(1), unless the authorization is terminated or revoked sooner. Performed at [facility], [place] [address] [place], [place] [address]   MRSA PCR Screening     Status: None   Collection Time: 10/22/19  2:51 PM   Specimen: Nasal Mucosa; Nasopharyngeal  Result Value Ref Range Status   MRSA by PCR NEGATIVE NEGATIVE Final    Comment:        The GeneXpert MRSA Assay (FDA approved for NASAL specimens only), is one component of a comprehensive MRSA colonization surveillance program. It is not intended to diagnose MRSA infection nor to guide or monitor treatment for MRSA infections. Performed at [facility], [place] [address] [place], [place] [address]     Radiology Reports CT ABDOMEN PELVIS W CONTRAST  Result Date: 10/22/2019 CLINICAL DATA:  Diffuse abdominal pain and anemia. EXAM: CT ABDOMEN AND PELVIS WITH CONTRAST TECHNIQUE: Multidetector CT imaging of the abdomen and pelvis was performed using the standard protocol following bolus administration of intravenous contrast. CONTRAST:  22m OMNIPAQUE IOHEXOL 300 MG/ML  SOLN COMPARISON:  CT scan 10/19/2019 FINDINGS: Lower chest:  Persistent moderate to large bilateral pleural effusions with significant overlying atelectasis. The heart is normal in size. No pericardial effusion. Age advanced coronary artery calcifications are noted. Hepatobiliary: No focal hepatic lesions or intrahepatic biliary dilatation. Ill-defined area of contrast enhancement and segment 7 likely a vascular shunt. The gallbladder is mildly contracted. No common bile duct dilatation. Pancreas: Scattered calcifications or likely due to prior inflammation. No worrisome pancreatic lesions or acute inflammatory process. No ductal dilatation. Spleen: Normal size. No focal lesions. Adrenals/Urinary Tract: The adrenal glands and kidneys are unremarkable. No worrisome renal lesions or hydronephrosis. The bladder appears normal. Stomach/Bowel: The stomach, duodenum, small bowel and colon are grossly normal without oral contrast. No acute inflammatory changes, mass lesions or obstructive findings. Vascular/Lymphatic: Severe/advanced atherosclerotic calcifications involving the aorta and branch vessels for age. No aneurysm or dissection. The major venous structures are patent. No mesenteric or retroperitoneal mass or adenopathy. Reproductive: The uterus and ovaries are unremarkable. Calcified fibroid again noted posteriorly. Other: Small to moderate amount of free pelvic fluid is again demonstrated. No obvious cause. Musculoskeletal: Stable compression fracture of L1. No worrisome bone lesions. Age advanced osteoporosis. IMPRESSION: 1. Persistent moderate to large bilateral pleural effusions with overlying atelectasis. 2. No acute abdominal/pelvic findings, mass lesions or adenopathy. 3. Severe/advanced atherosclerotic calcifications involving the aorta and branch vessels for age. 4. Small to moderate amount of persistent free pelvic fluid. No obvious cause. 5. Calcified uterine fibroids. Aortic Atherosclerosis (ICD10-I70.0). Electronically Signed   By: PMarijo SanesM.D.   On:  10/22/2019 13:56   DG CHEST PORT 1 VIEW  Result Date: 10/23/2019 CLINICAL DATA:  Acute respiratory insufficiency EXAM: PORTABLE CHEST 1 VIEW COMPARISON:  October 22, 2019 FINDINGS: The heart size and mediastinal contours are unchanged with mild cardiomegaly. There is a small left pleural effusion. There is slight interval improvement in the hazy/interstitial markings throughout both lungs. No acute osseous abnormality. IMPRESSION: Slight interval improvement in the bilateral hazy airspace opacities which could be due to pulmonary edema or infectious etiology. Small left pleural effusion. Electronically Signed   By: Prudencio Pair M.D.   On: 10/23/2019 06:32   DG Chest Port 1 View  Result Date: 10/22/2019 CLINICAL DATA:  CHF respiratory distress.  COVID-19 positive EXAM: PORTABLE CHEST 1 VIEW COMPARISON:  10/20/2019 FINDINGS: Diffuse bilateral airspace disease with basilar predominance shows mild progression. Moderate left effusion has improved in the interval. Probable small right effusion. IMPRESSION: Mild progression of diffuse bilateral airspace disease which may represent edema or pneumonia. Improvement in left pleural effusion. Electronically Signed   By: Franchot Gallo M.D.   On: 10/22/2019 09:28   ECHOCARDIOGRAM COMPLETE  Result Date: 10/23/2019    ECHOCARDIOGRAM REPORT   Patient Name:   Jamie [name]  Date of Exam: 10/23/2019 Medical Rec #:  [id number]  Height:       63.0 in Accession #:    [id number] Weight:       156.5 lb Date of Birth:  Sep 10, 1960 BSA:          1.742 m Patient Age:    30 years   BP:           124/65 mmHg Patient Gender: F          HR:           66 bpm. Exam Location:  Inpatient Procedure: 2D Echo, Cardiac Doppler and Color Doppler Indications:    I50.33 Acute on chronic diastolic (congestive) heart failure  History:        Patient has no prior history of Echocardiogram examinations.                 COPD, TIA and PAD; Risk Factors:Hypertension and Diabetes.                 COVID-19 Positive.   Sonographer:    Jonelle Sidle Dance Referring Phys: Brandon  1. Left ventricular ejection fraction, by estimation, is 55 to 60%. The left ventricle has normal function. The left ventricle has no regional wall motion abnormalities. Left ventricular diastolic parameters are consistent with Grade II diastolic dysfunction (pseudonormalization). Elevated left atrial pressure.  2. Right ventricular systolic function is normal. The right ventricular size is normal. Tricuspid regurgitation signal is inadequate for assessing PA pressure.  3. Left atrial size was moderately dilated.  4. Large pleural effusion in the left lateral region.  5. The mitral valve is normal in structure and function. Mild mitral valve regurgitation.  6. The aortic valve is normal in structure and function. Aortic valve regurgitation is not visualized.  7. The inferior vena cava is dilated in size with >50% respiratory variability, suggesting right atrial pressure of 8 mmHg. Comparison(s): No prior Echocardiogram. FINDINGS  Left Ventricle: Left ventricular ejection fraction, by estimation, is 55 to 60%. The left ventricle has normal function. The left ventricle has no regional wall motion abnormalities. The left ventricular internal cavity size was normal in size. There is  no left ventricular hypertrophy. Left ventricular diastolic parameters are consistent with Grade II diastolic dysfunction (pseudonormalization). Elevated left atrial pressure. Right Ventricle: The  right ventricular size is normal. No increase in right ventricular wall thickness. Right ventricular systolic function is normal. Tricuspid regurgitation signal is inadequate for assessing PA pressure. Left Atrium: Left atrial size was moderately dilated. Right Atrium: Right atrial size was normal in size. Pericardium: There is no evidence of pericardial effusion. Mitral Valve: The mitral valve is normal in structure and function. Mild mitral annular calcification.  Mild mitral valve regurgitation. Tricuspid Valve: The tricuspid valve is normal in structure. Tricuspid valve regurgitation is not demonstrated. Aortic Valve: The aortic valve is normal in structure and function. Aortic valve regurgitation is not visualized. Pulmonic Valve: The pulmonic valve was grossly normal. Pulmonic valve regurgitation is not visualized. Aorta: The aortic root and ascending aorta are structurally normal, with no evidence of dilitation. Venous: The inferior vena cava is dilated in size with greater than 50% respiratory variability, suggesting right atrial pressure of 8 mmHg. IAS/Shunts: No atrial level shunt detected by color flow Doppler. Additional Comments: There is a large pleural effusion in the left lateral region.  LEFT VENTRICLE PLAX 2D LVIDd:         3.79 cm  Diastology LVIDs:         2.87 cm  LV e' lateral:   6.00 cm/s LV PW:         1.27 cm  LV E/e' lateral: 18.3 LV IVS:        0.94 cm  LV e' medial:    3.57 cm/s LVOT diam:     1.80 cm  LV E/e' medial:  30.8 LV SV:         52 LV SV Index:   30 LVOT Area:     2.54 cm  RIGHT VENTRICLE            IVC RV Basal diam:  3.24 cm    IVC diam: 2.47 cm RV Mid diam:    1.67 cm RV S prime:     9.32 cm/s TAPSE (M-mode): 1.8 cm LEFT ATRIUM             Index       RIGHT ATRIUM           Index LA diam:        3.90 cm 2.24 cm/m  RA Area:     15.50 cm LA Vol (A2C):   79.2 ml 45.46 ml/m RA Volume:   42.40 ml  24.33 ml/m LA Vol (A4C):   61.2 ml 35.12 ml/m LA Biplane Vol: 73.7 ml 42.30 ml/m  AORTIC VALVE LVOT Vmax:   89.80 cm/s LVOT Vmean:  62.000 cm/s LVOT VTI:    0.204 m  AORTA Ao Root diam: 2.80 cm Ao Asc diam:  2.40 cm MITRAL VALVE MV Area (PHT): 3.72 cm     SHUNTS MV Decel Time: 204 msec     Systemic VTI:  0.20 m MV E velocity: 110.00 cm/s  Systemic Diam: 1.80 cm MV A velocity: 54.30 cm/s MV E/A ratio:  2.03 Mihai Croitoru MD Electronically signed by Sanda Klein MD Signature Date/Time: 10/23/2019/3:50:43 PM    Final    [place] EKG SITE  RITE  Result Date: 10/22/2019 If Site Rite image not attached, placement could not be confirmed due to current cardiac rhythm.  US Abdomen Limited RUQ  Result Date: 10/22/2019 CLINICAL DATA:  Acute right upper quadrant abdominal pain. EXAM: ULTRASOUND ABDOMEN LIMITED RIGHT UPPER QUADRANT COMPARISON:  October 19, 2019. FINDINGS: Gallbladder: No gallstones or wall thickening visualized. No sonographic Murphy sign noted by sonographer.  Common bile duct: Diameter: 5 mm which is within normal limits. Liver: No focal lesion identified. Within normal limits in parenchymal echogenicity. Portal vein is patent on color Doppler imaging with normal direction of blood flow towards the liver. Other: Minimal ascites is noted. IMPRESSION: Minimal ascites. No other abnormality seen in the right upper quadrant of the abdomen. Electronically Signed   By: Marijo Conception M.D.   On: 10/22/2019 09:03

## 2019-10-24 NOTE — Progress Notes (Signed)
NAME:  Jamie [name], MRN:  [id number], DOB:  04/16/1961, LOS: 3 ADMISSION DATE:  10/21/2019, CONSULTATION DATE:  10/22/2019 REFERRING MD: Dr Candiss Norse, CHIEF COMPLAINT: Shortness of breath  Brief History   59 y/o F who initially presented 2/28 to [facility] from SNF with reports of SOB, chest pain and abdominal pain.    History of present illness   59 y/o F who presented to [facility] on 2/28 with reports of shortness of breath, chest pain and abdominal pain.  She reports she has been in a skilled nursing facility for approximately 1 month due to frequent hospitalizations for heart failure.  In 07/2019 she was admitted to [facility] with an ECHO at that time that showed an LVEF of 55-60%, moderate concentric hypertrophy, mild anteroseptal hypokinesis and mild inferoseptal hypokinesis.  She was admitted from 1/30 - 09/25/19 and again from 2/8-2/13 for CHF exacerbations.  Has had over 5 thoracentesis performed in the last 10 months which have been reportedly transudative (thoracentesis 08/14/20 with 1550 ml removed --pl fluid 1.0 albumin, serum albumin 3.2, pleural LDH 168, serum 778, pleural to serum protein ration <0.5 and pleural to serum LDH issue <0.6).  She reports as much is 2 L being removed off 1 side at a time.  Repeat ECHO 10/01/2019 shows an LVEF of 55 to 60% with restrictive LV filling pattern consistent with elevated LA pressure and borderline concentric LVH.    She was readmitted 2/22 to [facility] with complaints of shortness of breath and hypertensive crisis with initial BP of 209/99.  She was Covid negative at that time.  CT of the abdomen / pelvis showed large bilateral pleural effusions with adjacent atelectasis.  Was treated with IV Lasix, Vasotec and nitroglycerin drip.  The CT of the abdomen also showed additional 1.5 cm ill-defined liver lesion with enhancement.  MRI was recommended but has not been completed due to rising renal function. ECHO 2/27 showed an LVEF of 55 to  60% with mild concentric LVH, both mean arterial pressure and LVEDP elevated, mild MR, mild TR, trace PR and RV systolic pressure of 48 mmHg.  The patient was transferred to [facility] on 2/28 for further evaluation by the advanced heart failure team.  On presentation to [facility] she was found to be Covid positive.  Inflammatory markers are pending at time of dictation.  The patient was found on 3/1 am with respiratory distress, hypertensive to greater than 948 systolic with complaints of shortness of breath and chest pain.  O2 was increased to 10 L salter.  She was treated with IV labetalol, nitro drip, and IV Lasix with 400 mL urinary output returned.   PCCM consulted for evaluation and transfer to ICU.  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  Unable to wear prosthesis due to lower extremity swelling. 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  No other abnormalities seen in the RUQ of the abdomen.  EKG 3/1 >>  Micro Data:  COVID 2/28 >> positive  HIV 3/1 >> negative  RVP 3/1 >>  Antimicrobials:  None  Interim history/subjective:  Report shortness of breath is improving No abdominal pain today  Objective   Blood pressure (!) 142/63, pulse 68, temperature 98.6 F (37 C), temperature source Oral, resp. rate 11, height 5\' 3"  (1.6 m), weight 70.4 kg, SpO2 98 %.        Intake/Output Summary (Last 24 hours) at 10/24/2019 1139 Last data filed at 10/24/2019 1115 Gross per 24 hour  Intake 896.83 ml  Output 1720 ml  Net -823.17 ml   Filed Weights   10/22/19 0411 10/23/19 0500 10/24/19  0500  Weight: 74.3 kg 71 kg 70.4 kg    Examination: General: Chronically ill-appearing HENT: Moist oral mucosa  Lungs: Decreased air movement at the bases S1-S2 appreciated Cardiovascular: S1-S2 appreciated Abdomen: Bowel sounds appreciated, no tenderness Extremities: No clubbing, no edema Neuro: Alert and oriented x3 GU: [facility] Problem list     Assessment & Plan:  Acute respiratory distress in setting of suspected flash pulmonary edema Acute on chronic hypoxemic respiratory failure -Continue oxygen supplementation -Continue diuresis -Continue high flow oxygen  Bilateral pleural effusion -Had thoracentesis for 750 cc of clear serous fluid from the right side today -May benefit from thoracentesis on the left  Hypertensive emergency -Wean off Cleviprex  Acute on chronic diastolic congestive heart failure Left ventricle ejection fraction 55 to 60% -Being followed by CHF service -Trend BNP -Appreciate cardiology input  Bilateral pleural effusions -Has had multiple thoracentesis in the past all been transudative -With improvement in symptoms, no indication for thoracentesis at present -Post right thoracentesis for 750 cc of clear fluid  Covid positive -Monitor -This does not appear to be playing a role in current symptoms  Acute kidney injury on chronic kidney disease stage III -Trend electrolytes -Monitor closely with diuresis  Diabetes -Moderate scale SSI  Abdominal pain -She does have a liver lesion, unable to tolerate MRI at present  Discussed with Dr. Candiss Norse Patient will be transitioned to the medical floor Best practice:  Diet: Liquid DVT prophylaxis: Heparin GI prophylaxis: Not applicable Glucose control: SSI Mobility: Bedrest Code Status: Full code Family Communication: Discussed with patient Disposition: Back to progressive floor  Labs   CBC: Recent Labs  Lab 10/21/19 1957 10/22/19 0300 10/22/19 1152 10/23/19 0513  10/24/19 0354  WBC 8.0 6.2  --  2.6* 3.5*  NEUTROABS  --   --   --  2.2 3.1  HGB 8.9* 8.5* 8.8* 8.2* 8.8*  HCT 27.8* 27.5* 26.0* 25.7* 27.6*  MCV 80.8 83.1  --  79.6* 80.0  PLT 176 179  --  151 638    Basic Metabolic Panel: Recent Labs  Lab 10/21/19 1957 10/22/19 0300 10/22/19 1152 10/23/19 0513 10/24/19 0354  NA 137 139 138 135 133*  K 3.7 3.7 3.5 4.0 4.1  CL 102 104  --  100 97*  CO2 25 22  --  22 23  GLUCOSE 214* 147*  --  270* 281*  BUN 33* 28*  --  36* 51*  CREATININE 1.22* 1.22*  --  1.66* 2.41*  CALCIUM 8.3* 8.3*  --  8.1* 8.1*  MG  --  1.5*  --  1.9 1.8   GFR: Estimated Creatinine Clearance: 23.6 mL/min (A) (by C-G formula based on SCr of 2.41 mg/dL (H)). Recent Labs  Lab 10/21/19 1957 10/21/19 2211 10/22/19 0300 10/22/19 0930 10/22/19 1019 10/23/19 0513 10/24/19 0354  PROCALCITON 0.20  --   --   --  0.22 0.28 0.32  WBC 8.0  --  6.2  --   --  2.6* 3.5*  LATICACIDVEN 0.8 1.1  --  1.2  --   --   --     Liver Function Tests: Recent Labs  Lab 10/21/19 1957 10/23/19 0513 10/24/19 0354  AST 18 15 17   ALT 17 17 17   ALKPHOS 59 55 54  BILITOT 0.8 0.5 0.4  PROT 5.9* 5.4* 5.5*  ALBUMIN 2.6* 2.4* 2.4*   No results for input(s): LIPASE, AMYLASE in the last 168 hours. No results for input(s): AMMONIA in the last 168 hours.  ABG    Component Value Date/Time   PHART 7.496 (H) 10/22/2019 1152   PCO2ART 37.8 10/22/2019 1152   PO2ART 75.0 (L) 10/22/2019 1152   HCO3 29.2 (H) 10/22/2019 1152   TCO2 30 10/22/2019 1152   O2SAT 96.0 10/22/2019 1152     Coagulation Profile: Recent Labs  Lab 10/21/19 1957  INR 1.0    Cardiac Enzymes: No results for input(s): CKTOTAL, CKMB, CKMBINDEX, TROPONINI in the last 168 hours.  HbA1C: Hgb A1c MFr Bld  Date/Time Value Ref Range Status  10/23/2019 05:03 AM 6.9 (H) 4.8 - 5.6 % Final    Comment:    (NOTE) Pre diabetes:          5.7%-6.4% Diabetes:              >6.4% Glycemic control for   <7.0% adults with  diabetes     CBG: Recent Labs  Lab 10/23/19 1137 10/23/19 1709 10/23/19 1914 10/23/19 2122 10/24/19 0808  GLUCAP 267* 106* 177* 258* 221*    Review of Systems:   No abdominal pain No chest pains or chest discomfort Shortness of breath improved  Past Medical History  She,  has a past medical history of Amputation below knee (HCC) RT, Chronic diastolic HF (heart failure) ([place]) (10/21/2019), COPD (chronic obstructive pulmonary disease) ([place]), Diabetes type 2, uncontrolled ([place]), Gastritis and duodenitis, HTN (hypertension) (10/21/2019), PAD (peripheral artery disease) ([place]) (10/21/2019), PAD (peripheral artery disease) ([place]), PNA (pneumonia), and TIA (transient ischemic attack).   Surgical History    Past Surgical History:  Procedure Laterality Date  . BELOW KNEE LEG AMPUTATION Right      Social History   reports that she has never smoked. She has never used smokeless tobacco. She reports that she does not drink alcohol or use drugs.   Family History   Her family history includes Diabetes in her brother; Heart attack in her paternal grandmother; Hypertension in her brother and maternal grandmother.   Allergies Allergies  Allergen Reactions  . Codeine Other (See Comments)    headaches  . Meperidine And Related Other (See Comments)    headaches  . Tetracyclines & Related Other (See Comments)    Caused yeast infections    Sherrilyn Rist, MD [place], PCCM Cell: [phone]

## 2019-10-24 NOTE — Progress Notes (Signed)
Peripherally Inserted Central Catheter/Midline Placement  The IV Nurse has discussed with the patient and/or persons authorized to consent for the patient, the purpose of this procedure and the potential benefits and risks involved with this procedure.  The benefits include less needle sticks, lab draws from the catheter, and the patient may be discharged home with the catheter. Risks include, but not limited to, infection, bleeding, blood clot (thrombus formation), and puncture of an artery; nerve damage and irregular heartbeat and possibility to perform a PICC exchange if needed/ordered by physician.  Alternatives to this procedure were also discussed.  Bard Power PICC patient education guide, fact sheet on infection prevention and patient information card has been provided to patient /or left at bedside.    PICC/Midline Placement Documentation  PICC Double Lumen 10/24/19 PICC Right Cephalic 36 cm 0 cm (Active)  Indication for Insertion or Continuance of Line Prolonged intravenous therapies 10/24/19 1035  Exposed Catheter (cm) 0 cm 10/24/19 1035  Site Assessment Clean;Dry;Intact 10/24/19 1035  Lumen #1 Status Flushed;Blood return noted 10/24/19 1035  Lumen #2 Status Flushed;Blood return noted 10/24/19 1035  Dressing Type Transparent 10/24/19 1035  Dressing Status Clean;Dry;Intact;Antimicrobial disc in place;Other (Comment) 10/24/19 1035  Dressing Intervention New dressing 10/24/19 1035  Dressing Change Due 10/31/19 10/24/19 1035       Jamie [name] 10/24/2019, 10:37 AM

## 2019-10-24 NOTE — Progress Notes (Addendum)
Inpatient Diabetes Program Recommendations  AACE/ADA: New Consensus Statement on Inpatient Glycemic Control (2015)  Target Ranges:  Prepandial:   less than 140 mg/dL      Peak postprandial:   less than 180 mg/dL (1-2 hours)      Critically ill patients:  140 - 180 mg/dL   Lab Results  Component Value Date   GLUCAP 221 (H) 10/24/2019   HGBA1C 6.9 (H) 10/23/2019    Review of Glycemic Control  Results for Jamie, [name] (MRN [id number]) as of 10/24/2019 11:04  Ref. Range 10/23/2019 11:37 10/23/2019 17:09 10/23/2019 19:14 10/23/2019 21:22 10/24/2019 08:08  Glucose-Capillary Latest Ref Range: 70 - 99 mg/dL 267 (H) 106 (H) 177 (H) 258 (H) 221 (H)    Diabetes history: DM2  Outpatient Diabetes medications: Novolog 10 units TID with meals +                                                        Basaglar 40 units QHS  Current orders for Inpatient glycemic control: Novolog 0-15 units TID +      Lantus 20 units daily +  Solumedrol 40 mg daily                                                                                                                                                                      Inpatient Diabetes Program Recommendations:     If steroids continue please consider  -Lantus 22 units daily -Novolog 3 units TID with meals  Thank you, Reche Dixon, RN, BSN Diabetes Coordinator Inpatient Diabetes Program [phone] (team pager from 8a-5p)

## 2019-10-25 ENCOUNTER — Inpatient Hospital Stay (HOSPITAL_COMMUNITY): Payer: Medicaid Other

## 2019-10-25 LAB — CBC WITH DIFFERENTIAL/PLATELET
Abs Immature Granulocytes: 0.06 10*3/uL (ref 0.00–0.07)
Basophils Absolute: 0 10*3/uL (ref 0.0–0.1)
Basophils Relative: 0 %
Eosinophils Absolute: 0 10*3/uL (ref 0.0–0.5)
Eosinophils Relative: 0 %
HCT: 26.3 % — ABNORMAL LOW (ref 36.0–46.0)
Hemoglobin: 8.2 g/dL — ABNORMAL LOW (ref 12.0–15.0)
Immature Granulocytes: 1 %
Lymphocytes Relative: 6 %
Lymphs Abs: 0.3 10*3/uL — ABNORMAL LOW (ref 0.7–4.0)
MCH: 25.6 pg — ABNORMAL LOW (ref 26.0–34.0)
MCHC: 31.2 g/dL (ref 30.0–36.0)
MCV: 82.2 fL (ref 80.0–100.0)
Monocytes Absolute: 0.2 10*3/uL (ref 0.1–1.0)
Monocytes Relative: 5 %
Neutro Abs: 3.7 10*3/uL (ref 1.7–7.7)
Neutrophils Relative %: 88 %
Platelets: 207 10*3/uL (ref 150–400)
RBC: 3.2 MIL/uL — ABNORMAL LOW (ref 3.87–5.11)
RDW: 13.8 % (ref 11.5–15.5)
WBC: 4.3 10*3/uL (ref 4.0–10.5)
nRBC: 0 % (ref 0.0–0.2)

## 2019-10-25 LAB — GLUCOSE, PLEURAL OR PERITONEAL FLUID: Glucose, Fluid: 297 mg/dL

## 2019-10-25 LAB — COMPREHENSIVE METABOLIC PANEL
ALT: 17 U/L (ref 0–44)
AST: 17 U/L (ref 15–41)
Albumin: 2.3 g/dL — ABNORMAL LOW (ref 3.5–5.0)
Alkaline Phosphatase: 54 U/L (ref 38–126)
Anion gap: 11 (ref 5–15)
BUN: 62 mg/dL — ABNORMAL HIGH (ref 6–20)
CO2: 24 mmol/L (ref 22–32)
Calcium: 8 mg/dL — ABNORMAL LOW (ref 8.9–10.3)
Chloride: 98 mmol/L (ref 98–111)
Creatinine, Ser: 3.3 mg/dL — ABNORMAL HIGH (ref 0.44–1.00)
GFR calc Af Amer: 17 mL/min — ABNORMAL LOW (ref >60)
GFR calc non Af Amer: 15 mL/min — ABNORMAL LOW (ref >60)
Glucose, Bld: 381 mg/dL — ABNORMAL HIGH (ref 70–99)
Potassium: 4.7 mmol/L (ref 3.5–5.1)
Sodium: 133 mmol/L — ABNORMAL LOW (ref 135–145)
Total Bilirubin: 0.4 mg/dL (ref 0.3–1.2)
Total Protein: 5.2 g/dL — ABNORMAL LOW (ref 6.5–8.1)

## 2019-10-25 LAB — RESPIRATORY PANEL BY PCR

## 2019-10-25 LAB — BODY FLUID CELL COUNT WITH DIFFERENTIAL
Eos, Fluid: 0 %
Lymphs, Fluid: 28 %
Monocyte-Macrophage-Serous Fluid: 57 % (ref 50–90)
Neutrophil Count, Fluid: 15 % (ref 0–25)
Total Nucleated Cell Count, Fluid: 36 cu mm (ref 0–1000)

## 2019-10-25 LAB — D-DIMER, QUANTITATIVE: D-Dimer, Quant: 3.24 ug{FEU}/mL — ABNORMAL HIGH (ref 0.00–0.50)

## 2019-10-25 LAB — PROTEIN, PLEURAL OR PERITONEAL FLUID: Total protein, fluid: <3 g/dL

## 2019-10-25 LAB — GLUCOSE, CAPILLARY
Glucose-Capillary: 288 mg/dL — ABNORMAL HIGH (ref 70–99)
Glucose-Capillary: 203 mg/dL — ABNORMAL HIGH (ref 70–99)
Glucose-Capillary: 209 mg/dL — ABNORMAL HIGH (ref 70–99)

## 2019-10-25 LAB — MAGNESIUM: Magnesium: 1.9 mg/dL (ref 1.7–2.4)

## 2019-10-25 LAB — CYTOLOGY - NON PAP

## 2019-10-25 LAB — LACTATE DEHYDROGENASE, PLEURAL OR PERITONEAL FLUID: LD, Fluid: 83 U/L — ABNORMAL HIGH (ref 3–23)

## 2019-10-25 LAB — PROCALCITONIN: Procalcitonin: 0.37 ng/mL

## 2019-10-25 LAB — BRAIN NATRIURETIC PEPTIDE: B Natriuretic Peptide: 945.6 pg/mL — ABNORMAL HIGH (ref 0.0–100.0)

## 2019-10-25 LAB — PROTEIN, TOTAL: Total Protein: 5.4 g/dL — ABNORMAL LOW (ref 6.5–8.1)

## 2019-10-25 LAB — C-REACTIVE PROTEIN: CRP: 1.5 mg/dL — ABNORMAL HIGH

## 2019-10-25 MED ORDER — METHYLPREDNISOLONE SODIUM SUCC 40 MG IJ SOLR
20.0000 mg | INTRAMUSCULAR | Status: AC
  Administered 2019-10-25: 20 mg via INTRAVENOUS
  Filled 2019-10-25: qty 1

## 2019-10-25 MED ORDER — CLONIDINE HCL 0.2 MG PO TABS
0.2000 mg | ORAL_TABLET | Freq: Two times a day (BID) | ORAL | Status: DC
  Administered 2019-10-25 – 2019-10-29 (×8): 0.2 mg via ORAL
  Filled 2019-10-25 (×8): qty 1

## 2019-10-25 MED ORDER — ENOXAPARIN SODIUM 30 MG/0.3ML ~~LOC~~ SOLN
30.0000 mg | SUBCUTANEOUS | Status: DC
  Administered 2019-10-26 – 2019-10-28 (×3): 30 mg via SUBCUTANEOUS
  Filled 2019-10-25 (×3): qty 0.3

## 2019-10-25 MED ORDER — LACTATED RINGERS IV SOLN: INTRAVENOUS | Status: AC

## 2019-10-25 MED ORDER — MORPHINE SULFATE (PF) 2 MG/ML IV SOLN: 1.0000 mg | Freq: Three times a day (TID) | INTRAVENOUS | Status: DC | PRN

## 2019-10-25 NOTE — Progress Notes (Signed)
Patient ID: Jamie [name], female   DOB: October 29, 1960, 59 y.o.   MRN: [id number]  Thoracentesis yesterday, 750 cc off (transudate).  Creatinine up to 3.3.  Breathing much improved.  - Agree with plan to give back gentle IV fluid today.  - RHC/LHC when creatinine improved.  - Increase clonidine to 0.1 bid with ongoing elevated BP.   Loralie Champagne 10/25/2019 2:22 PM

## 2019-10-25 NOTE — Progress Notes (Signed)
Assisted Dr Lenice Llamas with left thoracentesis.  Patient tolerated well.  O2 sats 100% during procedure.

## 2019-10-25 NOTE — Progress Notes (Signed)
PROGRESS NOTE                                                                                                                                                                                                             Patient Demographics:    Jamie [name], is a 59 y.o. 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    She was recently hospitalized twice to [facility] for shortness of breath, her work-up showed bilateral transudative pleural effusion, she had thoracentesis suggesting transudative fluid with no malignant cells.  Reason for recurrent effusion was thought to be acute on chronic diastolic CHF.  She was also found to have liver mass.  She was finally transferred to [facility] for cardiology evaluation for recurrent CHF.   Subjective:   Patient in bed, feels a whole lot better, denies any headache chest or abdominal pain.  Breathing is much improved.   Assessment  & Plan :    1.  Severe Acute hypoxic respiratory failure due to recurrent Acute on chronic diastolic CHF EF 15% .  Recurrent bilateral pleural effusions which are transudate if worked up in [place], EF 60%.  She was in severe respiratory distress morning of Nov 12, 2019 and almost coded, he also had evidence of hypertensive urgency likely due to respiratory distress.    She was started on high-dose IV Lasix, IV nitro drip and blood pressure medications, she has  diuresed very well although she had incontinence and output was not recorded, respiratory distress is much improved on 10/23/2019 that is 24 hours after the treatment regimen.  She will be continued on oral beta-blocker, hydralazine, Nitropaste and Lasix ( held since 10/24/2019 due to AKI) combination.  She was also seen by pulmonary underwent right-sided thoracentesis with 750 cc of serosanguineous fluid removed, cytology pending.  Previous  studies consistent with transudative fluid.  He is scheduled for left-sided thoracentesis sometime this admission by pulmonary.  Cardiology on board Case discussed with cardiologist Dr. Aundra Dubin on 10/23/2019.  Plan is for left heart cath once she clinically stabilizes. For now continue to titrate down oxygen and increase activity as tolerated.  2. Acute Covid 19 Viral Pneumonitis during the ongoing 2020 Covid 19 Pandemic - her main disease process and hypoxia is due to #1 above which is acute on chronic diastolic CHF.  Her Covid disease itself seems to be mild to moderate for which I will place her on low-dose steroids and remdesivir.  Monitor closely.  Encouraged the patient to sit up in chair in the daytime use I-S and flutter valve for pulmonary toiletry and then prone in bed when at night.  SpO2: 100 % O2 Flow Rate (L/min): 2 L/min FiO2 (%): 40 %  Recent Labs  Lab 10/21/19 1839 10/21/19 1957 10/22/19 1019 10/23/19 0513 10/24/19 0354 10/25/19 0520 10/25/19 0521  CRP  --   --  6.0* 6.5* 3.3* 1.5*  --   DDIMER  --   --  3.75* 3.29* 3.41* 3.24*  --   FERRITIN  --   --  177  --   --   --   --   BNP  --  1,040.2*  --  2,187.7* 1,140.9*  --  945.6*  PROCALCITON  --  0.20 0.22 0.28 0.32 0.37  --   SARSCOV2NAA POSITIVE*  --   --   --   --   --   --     Hepatic Function Latest Ref Rng & Units 10/25/2019 10/24/2019 10/23/2019  Total Protein 6.5 - 8.1 g/dL 5.2(L) 5.5(L) 5.4(L)  Albumin 3.5 - 5.0 g/dL 2.3(L) 2.4(L) 2.4(L)  AST 15 - 41 U/L 17 17 15   ALT 0 - 44 U/L 17  17 17   Alk Phosphatase 38 - 126 U/L 54 54 55  Total Bilirubin 0.3 - 1.2 mg/dL 0.4 0.4 0.5      3.  PAD.  S/p right BKA, CT evidence of abdominal diffuse calcification with recurrent abdominal pain.  Highly questionable for ischemic bowel, for now continue aspirin and statin for secondary prevention, clear liquid diet and monitor.  She likely has diffuse atherosclerosis due to uncontrolled diabetes mellitus and hypertension.  She denies any history of smoking, with supportive care she is somewhat better and tolerating liquid diet.  4.  Hypertensive crisis. Stopped  IV Clevidipine drip, oral medications adjusted and as needed IV hydralazine added.  Currently on combination of beta-blocker, Norvasc, clonidine, hydralazine along with Imdur.   5.  GERD with nausea.  Also has HX of diabetic gastroparesis.  For now supportive care with clear liquid diet, PPI and nausea medications.   6.  Dyslipidemia.  Continue statin.  7.  Anemia of chronic disease.  Monitor.  8.  Hypertensive urgency.  Resolved.  9.  Depression and anxiety.  Continue combination of bupropion, fluoxetine, buspirone and Xanax.  10.  AKI at [facility] with creatinine of 1.5.  Renal function worse likely due to combination of IV contrast from CT and diuresis, Lasix, hold further diuresis since 10/24/2019, gentle hydration on 10/25/2019.  Monitor with Foley.  We will also check renal ultrasound.  11. Liver mass.  Needs abdominal MRI once pulmonary status is improved.  12. DM2 - lantus + ISS  Lab Results  Component Value Date   HGBA1C 6.9 (H) 10/23/2019     CBG (last 3)  Recent  Labs    10/24/19 1209 10/24/19 1651 10/24/19 2222  GLUCAP 283* 188* 268*     Condition - Extremely Guarded  Family Communication  :  Daughter in law RN on 10/22/19 @ [phone], called on 10/23/2019 at 10:20 AM - full mailbox.  Code Status :  Full  Diet :   Diet Order            Diet Heart Room service appropriate? Yes; Fluid consistency:  Thin; Fluid restriction: 1200 mL Fluid  Diet effective now               Disposition Plan  : PCU currently awaiting renal function to improve for left heart cath, has recurrent flash pulmonary edema indicating underlying cardiac etiology.  Also awaiting improvement of renal function has developed AKI.  Consults  : Pulmonary, cardiology  Procedures  :    Ultrasound-guided right-sided thoracentesis on 10/24/2019 with 750 cc of fluid removed.  Done by pulmonary.  Pending left-sided thoracentesis.  Renal ultrasound.  CT - 1. Persistent moderate to large bilateral pleural effusions with overlying atelectasis. 2. No acute abdominal/pelvic findings, mass lesions or adenopathy. 3. Severe/advanced atherosclerotic calcifications involving the aorta and branch vessels for age. 4. Small to moderate amount of persistent free pelvic fluid. No obvious cause. 5. Calcified uterine fibroids. Aortic Atherosclerosis.  TTE from [facility] - EF 60%, dCHF  PICC line and Foley  requested.  PUD Prophylaxis : PPI  DVT Prophylaxis  :  Lovenox    Lab Results  Component Value Date   PLT 207 10/25/2019    Inpatient Medications  Scheduled Meds: . amLODipine  10 mg Oral Daily  . aspirin EC  81 mg Oral Daily  . atorvastatin  80 mg Oral QHS  . busPIRone  5 mg Oral Daily  . Chlorhexidine Gluconate Cloth  6 each Topical Daily  . clonazepam  0.25 mg Oral BID  . cloNIDine  0.1 mg Oral BID  . enoxaparin (LOVENOX) injection  40 mg Subcutaneous BID  . FLUoxetine  40 mg Oral Daily  . folic acid  1 mg Oral Daily  . hydrALAZINE  100 mg Oral Q8H  . insulin aspart  0-15 Units Subcutaneous TID WC  . insulin glargine  20 Units Subcutaneous QHS  . isosorbide mononitrate  60 mg Oral Daily  . labetalol  300 mg Oral BID  . methylPREDNISolone (SOLU-MEDROL) injection  40 mg Intravenous Q24H  . multivitamin with minerals  1 tablet Oral Daily  . pantoprazole  40 mg Oral Daily  . thiamine  100 mg Oral Daily   Continuous  Infusions: . lactated ringers 100 mL/hr at 10/25/19 0803  . remdesivir 100 mg in NS 100 mL Stopped (10/24/19 1000)   PRN Meds:.acetaminophen, albuterol, ALPRAZolam, hydrALAZINE, HYDROcodone-acetaminophen, ipratropium-albuterol, morphine injection, nitroGLYCERIN, ondansetron (ZOFRAN) IV, sodium chloride flush  Antibiotics  :    Anti-infectives (From admission, onward)   Start     Dose/Rate Route Frequency Ordered Stop   10/23/19 1000  remdesivir 100 mg in sodium chloride 0.9 % 100 mL IVPB     100 mg 200 mL/hr over 30 Minutes Intravenous Daily 10/22/19 1713 10/27/19 0959   10/22/19 1800  remdesivir 200 mg in sodium chloride 0.9% 250 mL IVPB     200 mg 580 mL/hr over 30 Minutes Intravenous Once 10/22/19 1713 10/22/19 1843   10/21/19 2100  cefTRIAXone (ROCEPHIN) 1 g in sodium chloride 0.9 % 100 mL IVPB  Status:  Discontinued     1 g 200 mL/hr  over 30 Minutes Intravenous Every 24 hours 10/21/19 1744 10/22/19 0748   10/21/19 2100  azithromycin (ZITHROMAX) tablet 500 mg  Status:  Discontinued     500 mg Oral Daily 10/21/19 1744 10/22/19 0748       Time Spent in minutes  30   Lala Lund M.D on 10/25/2019 at 10:32 AM  To page go to [email or web address] - password [email or web address]  [facility] -  Office  [phone]     See all Orders from today for further details    Objective:   Vitals:   10/24/19 2227 10/25/19 0622 10/25/19 0638 10/25/19 0721  BP: 133/62 (!) 146/56 (!) 156/60 (!) 161/67  Pulse: 71  71 71  Resp: 18  17 14   Temp: 98.1 F (36.7 C)  97.8 F (36.6 C)   TempSrc: Oral  Oral   SpO2: 100%  98% 100%  Weight:      Height:        Wt Readings from Last 3 Encounters:  10/24/19 70.4 kg     Intake/Output Summary (Last 24 hours) at 10/25/2019 1032 Last data filed at 10/25/2019 0721 Gross per 24 hour  Intake 801 ml  Output 1850 ml  Net -1049 ml     Physical Exam  Awake Alert, No new F.N deficits, Normal affect [place].AT,PERRAL Supple Neck,No JVD, No cervical  lymphadenopathy appriciated.  Symmetrical Chest wall movement, diminished breath sounds at the left base, improved breath sounds on the right side, RRR,No Gallops, Rubs or new Murmurs, No Parasternal Heave +ve B.Sounds, Abd Soft, No tenderness, No organomegaly appriciated, No rebound - guarding or rigidity. No Cyanosis, R. BKA     Data Review:    CBC Recent Labs  Lab 10/21/19 1957 10/21/19 1957 10/22/19 0300 10/22/19 1152 10/23/19 0513 10/24/19 0354 10/25/19 0520  WBC 8.0  --  6.2  --  2.6* 3.5* 4.3  HGB 8.9*   < > 8.5* 8.8* 8.2* 8.8* 8.2*  HCT 27.8*   < > 27.5* 26.0* 25.7* 27.6* 26.3*  PLT 176  --  179  --  151 173 207  MCV 80.8  --  83.1  --  79.6* 80.0 82.2  MCH 25.9*  --  25.7*  --  25.4* 25.5* 25.6*  MCHC 32.0  --  30.9  --  31.9 31.9 31.2  RDW 14.0  --  14.1  --  13.6 13.9 13.8  LYMPHSABS  --   --   --   --  0.2* 0.2* 0.3*  MONOABS  --   --   --   --  0.2 0.2 0.2  EOSABS  --   --   --   --  0.0 0.0 0.0  BASOSABS  --   --   --   --  0.0 0.0 0.0   < > = values in this interval not displayed.    Chemistries  Recent Labs  Lab 10/21/19 1957 10/21/19 1957 10/22/19 0300 10/22/19 1152 10/23/19 0513 10/24/19 0354 10/25/19 0520  NA 137   < > 139 138 135 133* 133*  K 3.7   < > 3.7 3.5 4.0 4.1 4.7  CL 102  --  104  --  100 97* 98  CO2 25  --  22  --  22 23 24   GLUCOSE 214*  --  147*  --  270* 281* 381*  BUN 33*  --  28*  --  36* 51* 62*  CREATININE 1.22*  --  1.22*  --  1.66* 2.41*  3.30*  CALCIUM 8.3*  --  8.3*  --  8.1* 8.1* 8.0*  MG  --   --  1.5*  --  1.9 1.8 1.9  AST 18  --   --   --  15 17 17   ALT 17  --   --   --  17 17 17   ALKPHOS 59  --   --   --  55 54 54  BILITOT 0.8  --   --   --  0.5 0.4 0.4   < > = values in this interval not displayed.   ------------------------------------------------------------------------------------------------------------------ No results for input(s): CHOL, HDL, LDLCALC, TRIG, CHOLHDL, LDLDIRECT in the last 72 hours.  Lab  Results  Component Value Date   HGBA1C 6.9 (H) 10/23/2019   ------------------------------------------------------------------------------------------------------------------ No results for input(s): TSH, T4TOTAL, T3FREE, THYROIDAB in the last 72 hours.  Invalid input(s): FREET3  Cardiac Enzymes No results for input(s): CKMB, TROPONINI, MYOGLOBIN in the last 168 hours.  Invalid input(s): CK ------------------------------------------------------------------------------------------------------------------    Component Value Date/Time   BNP 945.6 (H) 10/25/2019 3845    Micro Results Recent Results (from the past 240 hour(s))  SARS CORONAVIRUS 2 (TAT 6-24 HRS) Nasopharyngeal Nasopharyngeal Swab     Status: Abnormal   Collection Time: 10/21/19  6:39 PM   Specimen: Nasopharyngeal Swab  Result Value Ref Range Status   SARS Coronavirus 2 POSITIVE (A) NEGATIVE Final    Comment: RESULT CALLED TO, READ BACK BY AND VERIFIED WITH: K. AJRDY,RN 0017 10/22/2019 T. TYSOR (NOTE) SARS-CoV-2 target nucleic acids are DETECTED. The SARS-CoV-2 RNA is generally detectable in upper and lower respiratory specimens during the acute phase of infection. Positive results are indicative of the presence of SARS-CoV-2 RNA. Clinical correlation with patient history and other diagnostic information is  necessary to determine patient infection status. Positive results do not rule out bacterial infection or co-infection with other viruses.  The expected result is Negative. Fact Sheet for Patients: SugarRoll.be Fact Sheet for Healthcare Providers: https://www.woods-mathews.com/ This test is not yet approved or cleared by the [place] FDA and  has been authorized for detection and/or diagnosis of SARS-CoV-2 by FDA under an Emergency Use Authorization (EUA). This EUA will remain  in effect (meaning this test can be used) for th e duration of the COVID-19 declaration  under Section 564(b)(1) of the Act, 21 U.S.C. section 360bbb-3(b)(1), unless the authorization is terminated or revoked sooner. Performed at [facility], [place] [address], [place], [place] [address]   MRSA PCR Screening     Status: None   Collection Time: 10/22/19  2:51 PM   Specimen: Nasal Mucosa; Nasopharyngeal  Result Value Ref Range Status   MRSA by PCR NEGATIVE NEGATIVE Final    Comment:        The GeneXpert MRSA Assay (FDA approved for NASAL specimens only), is one component of a comprehensive MRSA colonization surveillance program. It is not intended to diagnose MRSA infection nor to guide or monitor treatment for MRSA infections. Performed at [facility], [place] [address] [place], [place] [address]   Stat Gram stain     Status: None   Collection Time: 10/24/19 11:14 AM   Specimen: Pleura; Body Fluid  Result Value Ref Range Status   Specimen Description PLEURAL RIGHT  Final   Special Requests NONE  Final   Gram Stain   Final    RARE WBC PRESENT, PREDOMINANTLY MONONUCLEAR NO ORGANISMS SEEN Performed at [facility], [address]  [place], [place] [address]    Report Status 10/24/2019 FINAL  Final  Respiratory Panel by PCR     Status: None   Collection Time: 10/25/19  4:18 AM   Specimen: Nasopharyngeal Swab; Respiratory  Result Value Ref Range Status   Adenovirus NOT DETECTED NOT DETECTED Final   Coronavirus 229E NOT DETECTED NOT DETECTED Final    Comment: (NOTE) The Coronavirus on the Respiratory Panel, DOES NOT test for the novel  Coronavirus (2019 nCoV)    Coronavirus HKU1 NOT DETECTED NOT DETECTED Final   Coronavirus NL63 NOT DETECTED NOT DETECTED Final   Coronavirus OC43 NOT DETECTED NOT DETECTED Final   Metapneumovirus NOT DETECTED NOT DETECTED Final   Rhinovirus / Enterovirus NOT DETECTED NOT DETECTED Final   Influenza A NOT DETECTED NOT DETECTED Final   Influenza B NOT DETECTED NOT DETECTED Final   Parainfluenza Virus 1 NOT DETECTED  NOT DETECTED Final   Parainfluenza Virus 2 NOT DETECTED NOT DETECTED Final   Parainfluenza Virus 3 NOT DETECTED NOT DETECTED Final   Parainfluenza Virus 4 NOT DETECTED NOT DETECTED Final   Respiratory Syncytial Virus NOT DETECTED NOT DETECTED Final   Bordetella pertussis NOT DETECTED NOT DETECTED Final   Chlamydophila pneumoniae NOT DETECTED NOT DETECTED Final   Mycoplasma pneumoniae NOT DETECTED NOT DETECTED Final    Comment: Performed at [facility], [place]. [address], [place], [place] [address]    Radiology Reports CT ABDOMEN PELVIS W CONTRAST  Result Date: 10/22/2019 CLINICAL DATA:  Diffuse abdominal pain and anemia. EXAM: CT ABDOMEN AND PELVIS WITH CONTRAST TECHNIQUE: Multidetector CT imaging of the abdomen and pelvis was performed using the standard protocol following bolus administration of intravenous contrast. CONTRAST:  44m OMNIPAQUE IOHEXOL 300 MG/ML  SOLN COMPARISON:  CT scan 10/19/2019 FINDINGS: Lower chest: Persistent moderate to large bilateral pleural effusions with significant overlying atelectasis. The heart is normal in size. No pericardial effusion. Age advanced coronary artery calcifications are noted. Hepatobiliary: No focal hepatic lesions or intrahepatic biliary dilatation. Ill-defined area of contrast enhancement and segment 7 likely a vascular shunt. The gallbladder is mildly contracted. No common bile duct dilatation. Pancreas: Scattered calcifications or likely due to prior inflammation. No worrisome pancreatic lesions or acute inflammatory process. No ductal dilatation. Spleen: Normal size. No focal lesions. Adrenals/Urinary Tract: The adrenal glands and kidneys are unremarkable. No worrisome renal lesions or hydronephrosis. The bladder appears normal. Stomach/Bowel: The stomach, duodenum, small bowel and colon are grossly normal without oral contrast. No acute inflammatory changes, mass lesions or obstructive findings. Vascular/Lymphatic: Severe/advanced  atherosclerotic calcifications involving the aorta and branch vessels for age. No aneurysm or dissection. The major venous structures are patent. No mesenteric or retroperitoneal mass or adenopathy. Reproductive: The uterus and ovaries are unremarkable. Calcified fibroid again noted posteriorly. Other: Small to moderate amount of free pelvic fluid is again demonstrated. No obvious cause. Musculoskeletal: Stable compression fracture of L1. No worrisome bone lesions. Age advanced osteoporosis. IMPRESSION: 1. Persistent moderate to large bilateral pleural effusions with overlying atelectasis. 2. No acute abdominal/pelvic findings, mass lesions or adenopathy. 3. Severe/advanced atherosclerotic calcifications involving the aorta and branch vessels for age. 4. Small to moderate amount of persistent free pelvic fluid. No obvious cause. 5. Calcified uterine fibroids. Aortic Atherosclerosis (ICD10-I70.0). Electronically Signed   By: PMarijo SanesM.D.   On: 10/22/2019 13:56   DG Chest Port 1 View  Result Date: 10/25/2019 CLINICAL DATA:  Status post thoracentesis. COVID positive. EXAM: PORTABLE CHEST 1 VIEW COMPARISON:  One-view chest x-ray 10/23/19  FINDINGS: Heart size is normal. Right-sided PICC line terminates in the distal SVC. Left pleural effusion and airspace disease is improved. No pneumothorax is present. Mild edema is present. IMPRESSION: 1. Improving left pleural effusion and airspace disease. 2. No pneumothorax. 3. Mild edema. Electronically Signed   By: San Morelle M.D.   On: 10/25/2019 06:57   DG CHEST PORT 1 VIEW  Result Date: 10/23/2019 CLINICAL DATA:  Acute respiratory insufficiency EXAM: PORTABLE CHEST 1 VIEW COMPARISON:  October 22, 2019 FINDINGS: The heart size and mediastinal contours are unchanged with mild cardiomegaly. There is a small left pleural effusion. There is slight interval improvement in the hazy/interstitial markings throughout both lungs. No acute osseous abnormality. IMPRESSION:  Slight interval improvement in the bilateral hazy airspace opacities which could be due to pulmonary edema or infectious etiology. Small left pleural effusion. Electronically Signed   By: Prudencio Pair M.D.   On: 10/23/2019 06:32   DG Chest Port 1 View  Result Date: 10/22/2019 CLINICAL DATA:  CHF respiratory distress.  COVID-19 positive EXAM: PORTABLE CHEST 1 VIEW COMPARISON:  10/20/2019 FINDINGS: Diffuse bilateral airspace disease with basilar predominance shows mild progression. Moderate left effusion has improved in the interval. Probable small right effusion. IMPRESSION: Mild progression of diffuse bilateral airspace disease which may represent edema or pneumonia. Improvement in left pleural effusion. Electronically Signed   By: Franchot Gallo M.D.   On: 10/22/2019 09:28   ECHOCARDIOGRAM COMPLETE  Result Date: 10/23/2019    ECHOCARDIOGRAM REPORT   Patient Name:   Lilliane Coughlin  Date of Exam: 10/23/2019 Medical Rec #:  [id number]  Height:       63.0 in Accession #:    [id number] Weight:       156.5 lb Date of Birth:  [birth year] BSA:          1.742 m Patient Age:    55 years   BP:           124/65 mmHg Patient Gender: F          HR:           66 bpm. Exam Location:  Inpatient Procedure: 2D Echo, Cardiac Doppler and Color Doppler Indications:    I50.33 Acute on chronic diastolic (congestive) heart failure  History:        Patient has no prior history of Echocardiogram examinations.                 COPD, TIA and PAD; Risk Factors:Hypertension and Diabetes.                 COVID-19 Positive.  Sonographer:    Jonelle Sidle Dance Referring Phys: [place]  1. Left ventricular ejection fraction, by estimation, is 55 to 60%. The left ventricle has normal function. The left ventricle has no regional wall motion abnormalities. Left ventricular diastolic parameters are consistent with Grade II diastolic dysfunction (pseudonormalization). Elevated left atrial pressure.  2. Right ventricular systolic function  is normal. The right ventricular size is normal. Tricuspid regurgitation signal is inadequate for assessing PA pressure.  3. Left atrial size was moderately dilated.  4. Large pleural effusion in the left lateral region.  5. The mitral valve is normal in structure and function. Mild mitral valve regurgitation.  6. The aortic valve is normal in structure and function. Aortic valve regurgitation is not visualized.  7. The inferior vena cava is dilated in size with >50% respiratory variability, suggesting right atrial pressure of 8 mmHg. Comparison(s): No  prior Echocardiogram. FINDINGS  Left Ventricle: Left ventricular ejection fraction, by estimation, is 55 to 60%. The left ventricle has normal function. The left ventricle has no regional wall motion abnormalities. The left ventricular internal cavity size was normal in size. There is  no left ventricular hypertrophy. Left ventricular diastolic parameters are consistent with Grade II diastolic dysfunction (pseudonormalization). Elevated left atrial pressure. Right Ventricle: The right ventricular size is normal. No increase in right ventricular wall thickness. Right ventricular systolic function is normal. Tricuspid regurgitation signal is inadequate for assessing PA pressure. Left Atrium: Left atrial size was moderately dilated. Right Atrium: Right atrial size was normal in size. Pericardium: There is no evidence of pericardial effusion. Mitral Valve: The mitral valve is normal in structure and function. Mild mitral annular calcification. Mild mitral valve regurgitation. Tricuspid Valve: The tricuspid valve is normal in structure. Tricuspid valve regurgitation is not demonstrated. Aortic Valve: The aortic valve is normal in structure and function. Aortic valve regurgitation is not visualized. Pulmonic Valve: The pulmonic valve was grossly normal. Pulmonic valve regurgitation is not visualized. Aorta: The aortic root and ascending aorta are structurally normal, with  no evidence of dilitation. Venous: The inferior vena cava is dilated in size with greater than 50% respiratory variability, suggesting right atrial pressure of 8 mmHg. IAS/Shunts: No atrial level shunt detected by color flow Doppler. Additional Comments: There is a large pleural effusion in the left lateral region.  LEFT VENTRICLE PLAX 2D LVIDd:         3.79 cm  Diastology LVIDs:         2.87 cm  LV e' lateral:   6.00 cm/s LV PW:         1.27 cm  LV E/e' lateral: 18.3 LV IVS:        0.94 cm  LV e' medial:    3.57 cm/s LVOT diam:     1.80 cm  LV E/e' medial:  30.8 LV SV:         52 LV SV Index:   30 LVOT Area:     2.54 cm  RIGHT VENTRICLE            IVC RV Basal diam:  3.24 cm    IVC diam: 2.47 cm RV Mid diam:    1.67 cm RV S prime:     9.32 cm/s TAPSE (M-mode): 1.8 cm LEFT ATRIUM             Index       RIGHT ATRIUM           Index LA diam:        3.90 cm 2.24 cm/m  RA Area:     15.50 cm LA Vol (A2C):   79.2 ml 45.46 ml/m RA Volume:   42.40 ml  24.33 ml/m LA Vol (A4C):   61.2 ml 35.12 ml/m LA Biplane Vol: 73.7 ml 42.30 ml/m  AORTIC VALVE LVOT Vmax:   89.80 cm/s LVOT Vmean:  62.000 cm/s LVOT VTI:    0.204 m  AORTA Ao Root diam: 2.80 cm Ao Asc diam:  2.40 cm MITRAL VALVE MV Area (PHT): 3.72 cm     SHUNTS MV Decel Time: 204 msec     Systemic VTI:  0.20 m MV E velocity: 110.00 cm/s  Systemic Diam: 1.80 cm MV A velocity: 54.30 cm/s MV E/A ratio:  2.03 Mihai Croitoru MD Electronically signed by Sanda Klein MD Signature Date/Time: 10/23/2019/3:50:43 PM    Final    [place] EKG SITE  RITE  Result Date: 10/22/2019 If Site Rite image not attached, placement could not be confirmed due to current cardiac rhythm.  US Abdomen Limited RUQ  Result Date: 10/22/2019 CLINICAL DATA:  Acute right upper quadrant abdominal pain. EXAM: ULTRASOUND ABDOMEN LIMITED RIGHT UPPER QUADRANT COMPARISON:  October 19, 2019. FINDINGS: Gallbladder: No gallstones or wall thickening visualized. No sonographic Murphy sign noted by sonographer.  Common bile duct: Diameter: 5 mm which is within normal limits. Liver: No focal lesion identified. Within normal limits in parenchymal echogenicity. Portal vein is patent on color Doppler imaging with normal direction of blood flow towards the liver. Other: Minimal ascites is noted. IMPRESSION: Minimal ascites. No other abnormality seen in the right upper quadrant of the abdomen. Electronically Signed   By: Marijo Conception M.D.   On: 10/22/2019 09:03

## 2019-10-25 NOTE — NC FL2 (Signed)
[place] MEDICAID FL2 LEVEL OF CARE SCREENING TOOL     IDENTIFICATION  Patient Name: Jamie [name] Birthdate: Oct 08, 1960 Sex: female Admission Date (Current Location): 10/21/2019  [facility] and [place] Number:  Herbalist and Address:  [facility] [place], [place] [address] [place], [place] [address]      Provider Number: [id number]  Attending Physician Name and Address:  Thurnell Lose, MD  Relative Name and Phone Number:  No contact information in facesheet    Current Level of Care: Hospital Recommended Level of Care: [place] Prior Approval Number:    Date Approved/Denied:   PASRR Number: [id number]  Discharge Plan: SNF    Current Diagnoses: Patient Active Problem List   Diagnosis Date Noted  . Chronic diastolic HF (heart failure) ([place]) 10/21/2019  . HTN (hypertension) 10/21/2019  . PAD (peripheral artery disease) ([place]) 10/21/2019  . CHF (congestive heart failure) ([place]) 10/21/2019  . Acute on chronic diastolic CHF (congestive heart failure) (H[facility])     Orientation RESPIRATION BLADDER Height & Weight     Self, Time, Situation, Place  O2(Nasal Cannula 2L) Incontinent, External catheter Weight: 155 lb 3.3 oz (70.4 kg) Height:  5\' 3"  (160 cm)  BEHAVIORAL SYMPTOMS/MOOD NEUROLOGICAL BOWEL NUTRITION STATUS  Other (Comment)(no behavioral symptoms/mood)   Continent Diet(see dc summary)  AMBULATORY STATUS COMMUNICATION OF NEEDS Skin   Limited Assist Verbally Skin abrasions(Abrasion;Ecchymosis;MASD)                       Personal Care Assistance Level of Assistance  Bathing, Feeding, Dressing Bathing Assistance: Limited assistance Feeding assistance: Independent Dressing Assistance: Maximum assistance     Functional Limitations Info  Sight, Hearing, Speech Sight Info: Adequate Hearing Info: Adequate Speech Info: Adequate    SPECIAL CARE FACTORS FREQUENCY  PT (By licensed PT), OT (By licensed OT)     PT Frequency:  4x OT Frequency: 3x            Contractures Contractures Info: Not present    Additional Factors Info  Code Status, Allergies, Isolation Precautions, Insulin Sliding Scale, Psychotropic Code Status Info: FULL Allergies Info: Codeine, Meperidine And Related, and Tetracyclines & Related. Psychotropic Info: clonazepam Insulin Sliding Scale Info: see dc summary Isolation Precautions Info: COVID +     Current Medications (10/25/2019):  This is the current hospital active medication list Current Facility-Administered Medications  Medication Dose Route Frequency Provider Last Rate Last Admin  . acetaminophen (TYLENOL) tablet 650 mg  650 mg Oral Q4H PRN Wynetta Fines T, MD      . albuterol (VENTOLIN HFA) 108 (90 Base) MCG/ACT inhaler 1-2 puff  1-2 puff Inhalation Q4H PRN Vertis Kelch, NP   2 puff at 10/22/19 0314  . ALPRAZolam Duanne Moron) tablet 0.25 mg  0.25 mg Oral BID PRN Wynetta Fines T, MD   0.25 mg at 10/24/19 4854  . amLODipine (NORVASC) tablet 10 mg  10 mg Oral Daily Thurnell Lose, MD   10 mg at 10/25/19 1025  . aspirin EC tablet 81 mg  81 mg Oral Daily Wynetta Fines T, MD   81 mg at 10/25/19 1025  . atorvastatin (LIPITOR) tablet 80 mg  80 mg Oral QHS Wynetta Fines T, MD   80 mg at 10/24/19 2254  . busPIRone (BUSPAR) tablet 5 mg  5 mg Oral Daily Wynetta Fines T, MD   5 mg at 10/25/19 1024  . Chlorhexidine Gluconate Cloth 2 % PADS 6 each  6  each Topical Daily Olalere, Adewale A, MD   6 each at 10/24/19 1315  . clonazepam (KLONOPIN) disintegrating tablet 0.25 mg  0.25 mg Oral BID Thurnell Lose, MD   0.25 mg at 10/25/19 1023  . cloNIDine (CATAPRES) tablet 0.1 mg  0.1 mg Oral BID Larey Dresser, MD   0.1 mg at 10/25/19 1024  . enoxaparin (LOVENOX) injection 40 mg  40 mg Subcutaneous BID Ollis, Brandi L, NP   40 mg at 10/25/19 1025  . FLUoxetine (PROZAC) capsule 40 mg  40 mg Oral Daily Wynetta Fines T, MD   40 mg at 10/25/19 1024  . folic acid (FOLVITE) tablet 1 mg  1 mg Oral Daily  Ollis, Brandi L, NP   1 mg at 10/25/19 1024  . hydrALAZINE (APRESOLINE) injection 10 mg  10 mg Intravenous Q6H PRN Thurnell Lose, MD   10 mg at 10/22/19 0807  . hydrALAZINE (APRESOLINE) tablet 100 mg  100 mg Oral Q8H Thurnell Lose, MD   100 mg at 10/25/19 3500  . HYDROcodone-acetaminophen (NORCO/VICODIN) 5-325 MG per tablet 1 tablet  1 tablet Oral Q6H PRN Thurnell Lose, MD   1 tablet at 10/24/19 1119  . insulin aspart (novoLOG) injection 0-15 Units  0-15 Units Subcutaneous TID WC Lequita Halt, MD   8 Units at 10/25/19 1217  . insulin glargine (LANTUS) injection 20 Units  20 Units Subcutaneous QHS Lequita Halt, MD   20 Units at 10/24/19 2254  . ipratropium-albuterol (DUONEB) 0.5-2.5 (3) MG/3ML nebulizer solution 3 mL  3 mL Nebulization Q4H PRN Wynetta Fines T, MD   3 mL at 10/22/19 2045  . isosorbide mononitrate (IMDUR) 24 hr tablet 60 mg  60 mg Oral Daily Thurnell Lose, MD   60 mg at 10/25/19 1024  . labetalol (NORMODYNE) tablet 300 mg  300 mg Oral BID Thurnell Lose, MD   300 mg at 10/25/19 1023  . lactated ringers infusion   Intravenous Continuous Thurnell Lose, MD 100 mL/hr at 10/25/19 0803 New Bag at 10/25/19 0803  . methylPREDNISolone sodium succinate (SOLU-MEDROL) 40 mg/mL injection 20 mg  20 mg Intravenous Q24H Lala Lund K, MD      . morphine 2 MG/ML injection 1 mg  1 mg Intravenous Q8H PRN Thurnell Lose, MD      . multivitamin with minerals tablet 1 tablet  1 tablet Oral Daily Ollis, Brandi L, NP   1 tablet at 10/25/19 1024  . nitroGLYCERIN (NITROSTAT) SL tablet 0.4 mg  0.4 mg Sublingual Q5 min PRN Isaiah Serge, NP   0.4 mg at 10/22/19 1708  . ondansetron (ZOFRAN) injection 4 mg  4 mg Intravenous Q6H PRN Wynetta Fines T, MD   8 mg at 10/22/19 0846  . pantoprazole (PROTONIX) EC tablet 40 mg  40 mg Oral Daily Thurnell Lose, MD   40 mg at 10/25/19 1023  . remdesivir 100 mg in sodium chloride 0.9 % 100 mL IVPB  100 mg Intravenous Daily Lala Lund K, MD  200 mL/hr at 10/25/19 1034 100 mg at 10/25/19 1034  . sodium chloride flush (NS) 0.9 % injection 10-40 mL  10-40 mL Intracatheter PRN Thurnell Lose, MD      . thiamine tablet 100 mg  100 mg Oral Daily Ollis, Brandi L, NP   100 mg at 10/25/19 1023     Discharge Medications: Please see discharge summary for a list of discharge medications.  Relevant Imaging Results:  Relevant  Lab Results:   Additional Information SSN: [id number]  [place], LCSW

## 2019-10-25 NOTE — Progress Notes (Signed)
Brief Pulmonary Note:  Patient transferred out of ICU. Thoracentesis on 3/4 demonstrates transudative effusion, likely from CHF. Will await cytology results.   Lenice Llamas, MD Pulmonary and [place] Pager: [place]

## 2019-10-25 NOTE — Progress Notes (Deleted)
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  She was recently hospitalized twice to [facility] for shortness of breath, her work-up showed bilateral transudative pleural effusion, she had thoracentesis suggesting transudative fluid with no malignant cells.  Reason for recurrent effusion was thought to be acute on chronic diastolic CHF.  She was also found to have liver mass.  She was finally transferred to [facility] for cardiology evaluation for recurrent CHF.  Also positive for Covid.  (patient transferred out of ICU to 5W 10/24/19)    PT Comments    Patient stable on 2L [place] during session. Squat pivot transfer (almost able to come to full stand) with bed height increased and modA x 2 + gait belt. Patient was able to clear armrest of chair during transfer with assistance (no drop arm chair available at this time). Patient denies practicing standing at rehab but it appears patient would be able to stand with assist in parallel bars or using Stedy. Recommend continued skilled PT services and discharge back to SNF for short term rehabiliation.    Follow Up Recommendations  SNF;Supervision/Assistance - 24 hour     Equipment Recommendations  Wheelchair cushion (measurements PT);Wheelchair (measurements PT)       Precautions / Restrictions Precautions Precautions: Fall Precaution Comments: R BKA April 2020 Required Braces or Orthoses: Other Brace(prosthesis - per pt too small currently, not in hospital) Restrictions Weight  Bearing Restrictions: No    Mobility  Bed Mobility   General bed mobility comments: Patient sitting EOB with PT tech present upon PT arrival  Transfers Overall transfer level: Needs assistance   Transfers: Squat Pivot Transfers     Squat pivot transfers: Mod assist;+2 physical assistance;From elevated surface     General transfer comment: Bed height increased, use of gait belt, transfer to patient's left, patient able to come to almost a full stand during transfer but denies practicing standing at rehab in parallel bars.     Balance Overall balance assessment: Needs assistance Sitting-balance support: No upper extremity supported;Feet supported Sitting balance-Leahy Scale: Good Sitting balance - Comments: sitting EOB approx 10 minutes for hygiene tasks and changing gown, weight shifting for gown removal for new gown to be donned      Cognition Arousal/Alertness: Awake/alert Behavior During Therapy: (frustrated) Overall Cognitive Status: Within Functional Limits for tasks assessed      Exercises Other Exercises Other Exercises: flutter valve x approx 5 trials Other Exercises: incentive spirometer x approx 5 trials, max approx 500    General Comments General comments (skin integrity, edema, etc.): on 2L [place], oxygen saturation stable, 99% at end of session, HR 72 bpm      Pertinent Vitals/Pain Pain Assessment: Faces Faces Pain Scale: Hurts a little bit Pain Location: with touch to back when washing back Pain Intervention(s): Limited activity within patient's tolerance;Monitored during session;Repositioned           PT Goals (current goals can now be found in the care plan section) Progress towards PT goals: Progressing toward goals    Frequency    Min 2X/week  PT Plan Current plan remains appropriate       AM-PAC PT "6 Clicks" Mobility   Outcome Measure  Help needed turning from your back to your side while in a flat bed without using bedrails?: A  Lot Help needed moving from lying on your back to sitting on the side of a flat bed without using bedrails?: A Lot Help needed moving to and from a bed to a chair (including a wheelchair)?: A Lot Help needed standing up from a chair using your arms (e.g., wheelchair or bedside chair)?: A Lot Help needed to walk in hospital room?: Total Help needed climbing 3-5 steps with a railing? : Total 6 Click Score: 10    End of Session Equipment Utilized During Treatment: Gait belt;Oxygen Activity Tolerance: Patient limited by fatigue(reports not feeling well today, unable to elaborate) Patient left: in chair;with call bell/phone within reach;with chair alarm set Nurse Communication: Mobility status;Need for lift equipment(possible need for hoyer back to bed if fatigued) PT Visit Diagnosis: Muscle weakness (generalized) (M62.81)     Time: 3875-6433 PT Time Calculation (min) (ACUTE ONLY): 32 min  Charges:  $Therapeutic Activity: 23-37 mins                    Birdie Hopes, DPT, PT Acute Rehab [phone] office     Birdie Hopes 10/25/2019, 12:07 PM

## 2019-10-25 NOTE — Progress Notes (Signed)
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  She was recently hospitalized twice to [facility] for shortness of breath, her work-up showed bilateral transudative pleural effusion, she had thoracentesis suggesting transudative fluid with no malignant cells.  Reason for recurrent effusion was thought to be acute on chronic diastolic CHF.  She was also found to have liver mass.  She was finally transferred to [facility] for cardiology evaluation for recurrent CHF.  Also positive for Covid.  (patient transferred out of ICU to 5W 10/24/19)    PT Comments    Patient stable on 2L [place] during sessionn. Squat pivot transfer to patient's left with modA x 2 and patient able to reach almost full stand during transfer. She is able to adjust positioning in chair and weight shift to offload pressure to bottom. Patient denies that she practiced standing while at rehab but it appears patient may be able to stand with use of Stedy or in parallel bars with assistance. Recommend continued skilled PT services and discharge back to SNF for short term rehabiliation.   Follow Up Recommendations  SNF;Supervision/Assistance - 24 hour     Equipment Recommendations  Wheelchair cushion (measurements PT);Wheelchair (measurements PT)    Recommendations for Other Services       Precautions / Restrictions Precautions Precautions: Fall Precaution Comments: R BKA April 2020 Required Braces or Orthoses: Other Brace(prosthesis - per pt too small currently,  not in hospital)    Mobility  Bed Mobility               General bed mobility comments: Patient sitting EOB with PT tech present upon PT arrival  Transfers Overall transfer level: Needs assistance   Transfers: Squat Pivot Transfers     Squat pivot transfers: Mod assist;+2 physical assistance;From elevated surface     General transfer comment: Bed height increased, use of gait belt, transfer to patient's left, patient able to come to almost a full stand during transfer but denies practicing standing at rehab in parallel bars.  Ambulation/Gait                 Stairs             Wheelchair Mobility    Modified Rankin (Stroke Patients Only)       Balance Overall balance assessment: Needs assistance Sitting-balance support: No upper extremity supported;Feet supported;Single extremity supported Sitting balance-Leahy Scale: Good Sitting balance - Comments: sitting EOB approx 10 minutes for hygiene tasks and changing gown, weight shifting for gown removal for new gown to be donned                                    Cognition Arousal/Alertness: Awake/alert Behavior During Therapy: (frustrated) Overall Cognitive Status: Within Functional Limits for tasks assessed                                        Exercises Other  Exercises Other Exercises: flutter valve x approx 5 trials Other Exercises: incentive spirometer x approx 5 trials, max approx 500((pt declined further trials at this time))    General Comments General comments (skin integrity, edema, etc.): on 2L [place], oxygen saturation stable, 99% at end of session, HR 72 bpm      Pertinent Vitals/Pain Pain Assessment: Faces Faces Pain Scale: Hurts a little bit Pain Location: with touch to back when washing back Pain Intervention(s): Limited activity within patient's tolerance;Monitored during session;Repositioned    Home Living                      Prior  Function            PT Goals (current goals can now be found in the care plan section) Progress towards PT goals: Progressing toward goals    Frequency    Min 2X/week      PT Plan Current plan remains appropriate    Co-evaluation              AM-PAC PT "6 Clicks" Mobility   Outcome Measure  Help needed turning from your back to your side while in a flat bed without using bedrails?: A Lot Help needed moving from lying on your back to sitting on the side of a flat bed without using bedrails?: A Lot Help needed moving to and from a bed to a chair (including a wheelchair)?: A Lot Help needed standing up from a chair using your arms (e.g., wheelchair or bedside chair)?: A Lot Help needed to walk in hospital room?: Total Help needed climbing 3-5 steps with a railing? : Total 6 Click Score: 10    End of Session Equipment Utilized During Treatment: Gait belt;Oxygen Activity Tolerance: Patient limited by fatigue(reports not feeling well today, unable to elaborate) Patient left: in chair;with call bell/phone within reach;with chair alarm set Nurse Communication: Mobility status;Need for lift equipment(possible need for hoyer back to bed if fatigued) PT Visit Diagnosis: Muscle weakness (generalized) (M62.81)     Time: 5573-2202 PT Time Calculation (min) (ACUTE ONLY): 32 min  Charges:  $Therapeutic Activity: 23-37 mins                     Birdie Hopes, DPT, PT Acute Rehab [phone] office     Birdie Hopes 10/25/2019, 2:14 PM

## 2019-10-25 NOTE — Procedures (Signed)
Thoracentesis Procedure Note  Pre-operative Diagnosis: left pleural effusion  Post-operative Diagnosis: same  Indications: pleural effusion  Procedure Details  Consent: Informed consent was obtained. Risks of the procedure were discussed including: infection, bleeding, pain, pneumothorax.  Under sterile conditions the patient was positioned.  Ultrasound guidance was used to locate the pleural space.  Chloraprep and sterile drapes were utilized.  1% buffered lidocaine was used to anesthetize the 8th lateral rib space. Fluid was obtained without any difficulties and minimal blood loss.  A dressing was applied to the wound and wound care instructions were provided.   Findings 1300 ml of clear pleural fluid was obtained. A sample was sent to Pathology for cytogenetics, flow, and cell counts, as well as for infection analysis.  Complications:  None; patient tolerated the procedure well.          Condition: stable  Plan A follow up chest x-ray was ordered.  Although the fluid was free-flowing, she has had multiple thoracenteses in the last few months and had significant discomfort with the procedure. I would not advise recurrent thoracentesis in light of this - would put her at risk for lung entrapment.  If fluid can be managed with diuretics that would be best.   Lenice Llamas, MD Pulmonary and [place] Pager: [place]

## 2019-10-25 NOTE — Progress Notes (Addendum)
Inpatient Diabetes Program Recommendations  AACE/ADA: New Consensus Statement on Inpatient Glycemic Control (2015)  Target Ranges:  Prepandial:   less than 140 mg/dL      Peak postprandial:   less than 180 mg/dL (1-2 hours)      Critically ill patients:  140 - 180 mg/dL   Lab Results  Component Value Date   GLUCAP 268 (H) 10/24/2019   HGBA1C 6.9 (H) 10/23/2019    Review of Glycemic Control Results for TRINNA, KUNST (MRN [id number]) as of 10/25/2019 09:40  Ref. Range 10/24/2019 08:08 10/24/2019 12:09 10/24/2019 16:51 10/24/2019 22:22  Glucose-Capillary Latest Ref Range: 70 - 99 mg/dL 221 (H) 283 (H) 188 (H) 268 (H)  Results for LESTER, PLATAS (MRN [id number]) as of 10/25/2019 09:40  Ref. Range 10/25/2019 05:20  Glucose Latest Ref Range: 70 - 99 mg/dL 381 (H)   Diabetes history: DM2  Outpatient Diabetes medications: Novolog 10 units TID with meals + Basaglar 40 units QHS  Current orders for Inpatient glycemic control:  Novolog 0-15 units TID + Lantus 20 units daily   Solumedrol 40 mg daily                                Inpatient Diabetes Program Recommendations:     If steroids continue please consider  -Lantus 25-28 units daily -Novolog 3 units TID with meals  Thanks,  Tama Headings RN, MSN, BC-ADM Inpatient Diabetes Coordinator Team Pager [phone] (8a-5p)

## 2019-10-26 ENCOUNTER — Inpatient Hospital Stay (HOSPITAL_COMMUNITY): Payer: Medicaid Other

## 2019-10-26 LAB — CBC WITH DIFFERENTIAL/PLATELET
Abs Immature Granulocytes: 0.09 10*3/uL — ABNORMAL HIGH (ref 0.00–0.07)
Basophils Absolute: 0 10*3/uL (ref 0.0–0.1)
Basophils Relative: 0 %
Eosinophils Absolute: 0 10*3/uL (ref 0.0–0.5)
Eosinophils Relative: 0 %
HCT: 26.4 % — ABNORMAL LOW (ref 36.0–46.0)
Hemoglobin: 8 g/dL — ABNORMAL LOW (ref 12.0–15.0)
Immature Granulocytes: 2 %
Lymphocytes Relative: 5 %
Lymphs Abs: 0.3 10*3/uL — ABNORMAL LOW (ref 0.7–4.0)
MCH: 24.6 pg — ABNORMAL LOW (ref 26.0–34.0)
MCHC: 30.3 g/dL (ref 30.0–36.0)
MCV: 81.2 fL (ref 80.0–100.0)
Monocytes Absolute: 0.2 10*3/uL (ref 0.1–1.0)
Monocytes Relative: 4 %
Neutro Abs: 5 10*3/uL (ref 1.7–7.7)
Neutrophils Relative %: 89 %
Platelets: 201 10*3/uL (ref 150–400)
RBC: 3.25 MIL/uL — ABNORMAL LOW (ref 3.87–5.11)
RDW: 13.6 % (ref 11.5–15.5)
WBC: 5.6 10*3/uL (ref 4.0–10.5)
nRBC: 0 % (ref 0.0–0.2)

## 2019-10-26 LAB — CYTOLOGY - NON PAP

## 2019-10-26 LAB — GLUCOSE, CAPILLARY
Glucose-Capillary: 356 mg/dL — ABNORMAL HIGH (ref 70–99)
Glucose-Capillary: 276 mg/dL — ABNORMAL HIGH (ref 70–99)
Glucose-Capillary: 239 mg/dL — ABNORMAL HIGH (ref 70–99)
Glucose-Capillary: 187 mg/dL — ABNORMAL HIGH (ref 70–99)
Glucose-Capillary: 214 mg/dL — ABNORMAL HIGH (ref 70–99)

## 2019-10-26 LAB — COMPREHENSIVE METABOLIC PANEL
ALT: 16 U/L (ref 0–44)
AST: 15 U/L (ref 15–41)
Albumin: 2.3 g/dL — ABNORMAL LOW (ref 3.5–5.0)
Alkaline Phosphatase: 52 U/L (ref 38–126)
Anion gap: 11 (ref 5–15)
BUN: 66 mg/dL — ABNORMAL HIGH (ref 6–20)
CO2: 24 mmol/L (ref 22–32)
Calcium: 8.3 mg/dL — ABNORMAL LOW (ref 8.9–10.3)
Chloride: 98 mmol/L (ref 98–111)
Creatinine, Ser: 2.91 mg/dL — ABNORMAL HIGH (ref 0.44–1.00)
GFR calc Af Amer: 20 mL/min — ABNORMAL LOW (ref >60)
GFR calc non Af Amer: 17 mL/min — ABNORMAL LOW (ref >60)
Glucose, Bld: 296 mg/dL — ABNORMAL HIGH (ref 70–99)
Potassium: 4.8 mmol/L (ref 3.5–5.1)
Sodium: 133 mmol/L — ABNORMAL LOW (ref 135–145)
Total Bilirubin: 0.4 mg/dL (ref 0.3–1.2)
Total Protein: 5.2 g/dL — ABNORMAL LOW (ref 6.5–8.1)

## 2019-10-26 LAB — MAGNESIUM: Magnesium: 1.9 mg/dL (ref 1.7–2.4)

## 2019-10-26 LAB — PROCALCITONIN: Procalcitonin: 0.32 ng/mL

## 2019-10-26 LAB — C-REACTIVE PROTEIN: CRP: 0.8 mg/dL (ref <1.0)

## 2019-10-26 LAB — D-DIMER, QUANTITATIVE: D-Dimer, Quant: 3.02 ug/mL-FEU — ABNORMAL HIGH (ref 0.00–0.50)

## 2019-10-26 LAB — LACTATE DEHYDROGENASE: LDH: 229 U/L — ABNORMAL HIGH (ref 98–192)

## 2019-10-26 LAB — BRAIN NATRIURETIC PEPTIDE: B Natriuretic Peptide: 864.8 pg/mL — ABNORMAL HIGH (ref 0.0–100.0)

## 2019-10-26 MED ORDER — INSULIN ASPART 100 UNIT/ML ~~LOC~~ SOLN
0.0000 [IU] | Freq: Every day | SUBCUTANEOUS | Status: DC
  Administered 2019-10-26 – 2019-11-01 (×3): 2 [IU] via SUBCUTANEOUS

## 2019-10-26 MED ORDER — LACTATED RINGERS IV SOLN: INTRAVENOUS | Status: AC

## 2019-10-26 MED ORDER — INSULIN ASPART 100 UNIT/ML ~~LOC~~ SOLN
0.0000 [IU] | Freq: Three times a day (TID) | SUBCUTANEOUS | Status: DC
  Administered 2019-10-26 – 2019-10-27 (×2): 3 [IU] via SUBCUTANEOUS
  Administered 2019-10-27: 8 [IU] via SUBCUTANEOUS
  Administered 2019-10-27: 3 [IU] via SUBCUTANEOUS
  Administered 2019-10-28: 8 [IU] via SUBCUTANEOUS
  Administered 2019-10-28: 5 [IU] via SUBCUTANEOUS
  Administered 2019-10-28: 3 [IU] via SUBCUTANEOUS
  Administered 2019-10-29: 5 [IU] via SUBCUTANEOUS
  Administered 2019-10-30 (×3): 8 [IU] via SUBCUTANEOUS
  Administered 2019-10-31: 3 [IU] via SUBCUTANEOUS
  Administered 2019-10-31: 2 [IU] via SUBCUTANEOUS
  Administered 2019-10-31: 5 [IU] via SUBCUTANEOUS
  Administered 2019-11-01: 3 [IU] via SUBCUTANEOUS
  Administered 2019-11-01 (×2): 5 [IU] via SUBCUTANEOUS
  Administered 2019-11-02: 2 [IU] via SUBCUTANEOUS

## 2019-10-26 MED ORDER — INSULIN GLARGINE 100 UNIT/ML ~~LOC~~ SOLN
25.0000 [IU] | Freq: Every day | SUBCUTANEOUS | Status: DC
  Administered 2019-10-26 – 2019-10-28 (×3): 25 [IU] via SUBCUTANEOUS
  Filled 2019-10-26 (×5): qty 0.25

## 2019-10-26 NOTE — Progress Notes (Addendum)
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  She was recently hospitalized twice to [facility] for shortness of breath, her work-up showed bilateral transudative pleural effusion, she had thoracentesis suggesting transudative fluid with no malignant cells.  Reason for recurrent effusion was thought to be acute on chronic diastolic CHF.  She was also found to have liver mass.  She was finally transferred to [facility] for cardiology evaluation for recurrent CHF.  Also positive for Covid.     OT comments  Patient supine in bed on arrival.  She was very fatigued but willing to attempt therapy.  Patient able to get to EOB with min assist and sit at EOB completing grooming with set up.  Completed x5 sit <> stands with mod assist x2.  Required bilateral UE support and patient was unable to stand straight up, heavily leaning on therapist.  Became very fatigued by 5th try.  Completed a squat pivot to chair as patient unable to do stand pivot.  She also was not able to tell therapist what she had done in previous sessions, unsure if this was because of fatigue or cognitive fogginess.  Will continue to evaluate cogn in next visit as well as continue with strengthening and balance to increase independence with ADLs and functional mobility.    Follow Up Recommendations  SNF;Supervision/Assistance - 24 hour    Equipment Recommendations  Other (comment)    Recommendations for Other Services      Precautions / Restrictions  Precautions Precautions: Fall Precaution Comments: R BKA April 2020 Restrictions Weight Bearing Restrictions: No       Mobility Bed Mobility Overal bed mobility: Needs Assistance Bed Mobility: Supine to Sit     Supine to sit: Min assist        Transfers Overall transfer level: Needs assistance Equipment used: 2 person hand held assist Transfers: Set designer Transfers;Sit to/from Stand Sit to Stand: Mod assist;+2 physical assistance;+2 safety/equipment   Squat pivot transfers: Mod assist;+2 physical assistance;+2 safety/equipment          Balance Overall balance assessment: Needs assistance Sitting-balance support: No upper extremity supported;Feet supported Sitting balance-Leahy Scale: Good     Standing balance support: Bilateral upper extremity supported;During functional activity Standing balance-Leahy Scale: Zero Standing balance comment: could not stand fully upright and needed +2 mod assist.                            ADL either performed or assessed with clinical judgement   ADL Overall ADL's : Needs assistance/impaired Eating/Feeding: Set up;Sitting   Grooming: Supervision/safety;Set up;Wash/dry hands;Wash/dry face;Sitting   Upper Body Bathing: Supervision/ safety;Set up;Sitting                           Functional mobility during ADLs: Moderate assistance;+2 for physical assistance;+2 for safety/equipment       Vision       Perception     Praxis      Cognition Arousal/Alertness: Awake/alert Behavior During Therapy: [facility] for tasks  assessed/performed Overall Cognitive Status: Within Functional Limits for tasks assessed                                 General Comments: Patient was quite tired during session and having some trouble recalling what she had done in previous sessions        Exercises Exercises: Other exercises Other Exercises Other Exercises: x5 sit to stands   Shoulder Instructions        General Comments      Pertinent Vitals/ Pain       Pain Assessment: Faces Faces Pain Scale: Hurts even more Pain Location: R side abdomen Pain Descriptors / Indicators: Aching Pain Intervention(s): Limited activity within patient's tolerance;Monitored during session  Home Living                                          Prior Functioning/Environment              Frequency  Min 2X/week        Progress Toward Goals  OT Goals(current goals can now be found in the care plan section)  Progress towards OT goals: Progressing toward goals  Acute Rehab OT Goals Patient Stated Goal: to go to Rehab and then home OT Goal Formulation: With patient Time For Goal Achievement: 11/06/19 Potential to Achieve Goals: Good  Plan Discharge plan remains appropriate    Co-evaluation                 AM-PAC OT "6 Clicks" Daily Activity     Outcome Measure   Help from another person eating meals?: A Little Help from another person taking care of personal grooming?: A Little Help from another person toileting, which includes using toliet, bedpan, or urinal?: Total Help from another person bathing (including washing, rinsing, drying)?: A Lot Help from another person to put on and taking off regular upper body clothing?: A Little Help from another person to put on and taking off regular lower body clothing?: Total 6 Click Score: 13    End of Session Equipment Utilized During Treatment: Gait belt;Oxygen  OT Visit Diagnosis: Muscle weakness (generalized) (M62.81);Other abnormalities of gait and mobility (R26.89)   Activity Tolerance Patient limited by fatigue   Patient Left in chair;with call bell/phone within reach;with chair alarm set   Nurse Communication Mobility status;Need for lift equipment        Time: 3300-7622 OT Time Calculation (min): 28 min  Charges: OT General Charges $OT Visit: 1 Visit OT Treatments $Self Care/Home Management :  8-22 mins $Therapeutic Activity: 8-22 mins  August Luz, OTR/L    Phylliss Bob 10/26/2019, 2:33 PM

## 2019-10-26 NOTE — Progress Notes (Signed)
Inpatient Diabetes Program Recommendations  AACE/ADA: New Consensus Statement on Inpatient Glycemic Control (2015)  Target Ranges:  Prepandial:   less than 140 mg/dL      Peak postprandial:   less than 180 mg/dL (1-2 hours)      Critically ill patients:  140 - 180 mg/dL   Lab Results  Component Value Date   GLUCAP 276 (H) 10/26/2019   HGBA1C 6.9 (H) 10/23/2019    Review of Glycemic Control Results for BRISSIA, DELISA (MRN [id number]) as of 10/26/2019 11:23  Ref. Range 10/25/2019 07:51 10/25/2019 12:02 10/25/2019 16:32 10/25/2019 20:54 10/26/2019 07:45  Glucose-Capillary Latest Ref Range: 70 - 99 mg/dL 356 (H) 288 (H) 203 (H) 209 (H) 276 (H)   Diabetes history: DM2  Outpatient Diabetes medications: Novolog 10 units TID with meals + Basaglar 40 units QHS  Current orders for Inpatient glycemic control:  Novolog 0-15 units TID + Lantus 20 units daily   Inpatient Diabetes Program Recommendations:   Noted steroids discontinued 10/25/19. -Increase Lantus to 30 units -Add Novolog 5 units tid meal coverage if eats 50%  Thank you, Bethena Roys E. Shamra Bradeen, RN, MSN, CDE  Diabetes Coordinator Inpatient Glycemic Control Team Team Pager [phone] (8am-5pm) 10/26/2019 11:25 AM

## 2019-10-26 NOTE — Progress Notes (Signed)
Patient ID: Jamie [name], female   DOB: 1961/03/20, 59 y.o.   MRN: [id number]  Thoracentesis to left yesterday, 1300 cc off (transudate).  Creatinine 3.3 => 2.9.   - Would hold off on further IV fluid and let her equilibrate on her own.  - RHC/LHC when creatinine improved.  Can do as inpatient next week if creatinine back down or can see back and do as outpatient.   Call with questions over weekend.   Loralie Champagne 10/26/2019

## 2019-10-26 NOTE — Progress Notes (Signed)
PROGRESS NOTE                                                                                                                                                                                                             Patient Demographics:    Jamie [name], is a 59 y.o. 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    She was recently hospitalized twice to [facility] for shortness of breath, her work-up showed bilateral transudative pleural effusion, she had thoracentesis suggesting transudative fluid with no malignant cells.  Reason for recurrent effusion was thought to be acute on chronic diastolic CHF.  She was also found to have liver mass.  She was finally transferred to [facility] for cardiology evaluation for recurrent CHF.   Subjective:   Patient in bed, appears comfortable, denies any headache, no fever, no chest pain or pressure, no shortness of breath , no abdominal pain. No focal weakness.    Assessment  & Plan :    1.  Severe Acute hypoxic respiratory failure due to recurrent Acute on chronic diastolic CHF EF 51% .  Recurrent bilateral pleural effusions which are transudate if worked up in [place], EF 60%.  She was in severe respiratory distress morning of 2019-11-20 and almost coded, he also had evidence of hypertensive urgency likely due to respiratory distress.    She was started on high-dose IV Lasix, IV nitro drip  and blood pressure medications, she has diuresed very well although she had incontinence and output was not recorded, respiratory distress is much improved on 10/23/2019 that is 24 hours after the treatment regimen.  She will be continued on oral beta-blocker, hydralazine, Nitropaste and Lasix ( held since 10/24/2019 due to AKI) combination.  She was also seen by pulmonary underwent right-sided thoracentesis with 750 cc of  serosanguineous fluid removed on 10/24/2019 and left-sided thoracentesis with 1300 cc of fluid removed on 10/25/2019, it seems to be consistent with transudate as well, per pulmonary physician Dr. Elsworth Soho who communicated to me on 10/26/2019 fluid is transudative and not suspicious of malignant effusion.  Continue diuresis and monitor.  Is also discussed with cardiologist Dr. Loralie Champagne who will follow the patient in the office.  Nothing else to offer at this time except continue diuresis and medical management as we are doing.  Follow final cytology report.   2. Acute Covid 19 Viral Pneumonitis during the ongoing 2020 Covid 19 Pandemic - her main disease process and hypoxia is due to #1 above which is acute on chronic diastolic CHF.  Her Covid disease itself seems to be mild to moderate for which I will place her on low-dose steroids and remdesivir.  Monitor closely.  Encouraged the patient to sit up in chair in the daytime use I-S and flutter valve for pulmonary toiletry and then prone in bed when at night.  SpO2: 100 % O2 Flow Rate (L/min): 2 L/min FiO2 (%): 40 %  Recent Labs  Lab 10/21/19 1839 10/21/19 1957 10/21/19 1957 10/22/19 1019 10/23/19 0513 10/24/19 0354 10/25/19 0520 10/25/19 0521 10/26/19 0313  CRP  --   --   --  6.0* 6.5* 3.3* 1.5*  --  0.8  DDIMER  --   --   --  3.75* 3.29* 3.41* 3.24*  --  3.02*  FERRITIN  --   --   --  177  --   --   --   --   --   BNP  --  1,040.2*  --   --  2,187.7* 1,140.9*  --  945.6* 864.8*  PROCALCITON  --  0.20   < > 0.22 0.28 0.32 0.37   --  0.32  SARSCOV2NAA POSITIVE*  --   --   --   --   --   --   --   --    < > = values in this interval not displayed.    Hepatic Function Latest Ref Rng & Units 10/26/2019 10/25/2019 10/25/2019  Total Protein 6.5 - 8.1 g/dL 5.2(L) 5.4(L) 5.2(L)  Albumin 3.5 - 5.0 g/dL 2.3(L) - 2.3(L)  AST 15 - 41 U/L 15 - 17  ALT 0 - 44 U/L 16 - 17  Alk Phosphatase 38 - 126 U/L 52 - 54  Total Bilirubin 0.3 - 1.2 mg/dL 0.4 - 0.4      3.  PAD.  S/p right BKA, CT evidence of abdominal diffuse calcification with recurrent abdominal pain.  Highly questionable for ischemic bowel, for now continue aspirin and statin for secondary prevention, clear liquid diet and monitor.  She likely has diffuse atherosclerosis due to uncontrolled diabetes mellitus and hypertension.  She denies any history of smoking, with supportive care she is somewhat better and tolerating liquid diet.  4.  Hypertensive crisis. Stopped  IV Clevidipine drip, oral medications adjusted and as needed IV hydralazine added.  Currently on combination of beta-blocker, Norvasc, clonidine, hydralazine along with Imdur.   5.  GERD with nausea.  Also has HX of diabetic gastroparesis.  For now supportive care with clear liquid diet, PPI and nausea medications.   6.  Dyslipidemia.  Continue statin.  7.  Anemia of chronic disease.  Monitor.  8.  Hypertensive urgency.  Resolved.  9.  Depression and anxiety.  Continue combination of bupropion, fluoxetine, buspirone and Xanax.  10.  AKI at  [place] with creatinine of 1.5.  Renal function worse likely due to combination of IV contrast from CT and diuresis, Lasix, hold further diuresis since 10/24/2019, tinea gentle hydration, renal function has plateaued, renal ultrasound unremarkable, will monitor closely.  11. Liver mass.  Needs abdominal MRI once pulmonary status is improved.  12. DM2 - lantus + ISS, will increase Lantus further for better control.  Lab Results  Component Value Date   HGBA1C 6.9 (H)  10/23/2019     CBG (last 3)  Recent Labs    10/25/19 1632 10/25/19 2054 10/26/19 0745  GLUCAP 203* 209* 276*     Condition - Extremely Guarded  Family Communication  :  Daughter in law RN on 10/22/19 @ [phone], called on 10/23/2019 at 10:20 AM - full mailbox.  Code Status :  Full  Diet :   Diet Order            Diet Heart Room service appropriate? Yes; Fluid consistency: Thin; Fluid restriction: 1200 mL Fluid  Diet effective now               Disposition Plan  : PCU currently awaiting renal function to improve for left heart cath, has recurrent flash pulmonary edema indicating underlying cardiac etiology.  Also awaiting improvement of renal function has developed AKI.  Consults  : Pulmonary, cardiology  Procedures  :    Ultrasound-guided right-sided thoracentesis on 10/24/2019 with 750 cc of fluid removed.  Done by pulmonary.  Pending left-sided thoracentesis.  Renal ultrasound.  CT - 1. Persistent moderate to large bilateral pleural effusions with overlying atelectasis. 2. No acute abdominal/pelvic findings, mass lesions or adenopathy. 3. Severe/advanced atherosclerotic calcifications involving the aorta and branch vessels for age. 4. Small to moderate amount of persistent free pelvic fluid. No obvious cause. 5. Calcified uterine fibroids. Aortic Atherosclerosis.  TTE from [facility] - EF 60%, dCHF  PICC line and Foley  requested.  PUD Prophylaxis : PPI  DVT Prophylaxis  :  Lovenox    Lab Results  Component Value Date   PLT 201 10/26/2019    Inpatient Medications  Scheduled Meds: . amLODipine  10 mg Oral Daily  . aspirin EC  81 mg Oral Daily  . atorvastatin  80 mg Oral QHS  . busPIRone  5 mg Oral Daily  . Chlorhexidine Gluconate Cloth  6 each Topical Daily  . clonazepam  0.25 mg Oral BID  . cloNIDine  0.2 mg Oral BID  . enoxaparin (LOVENOX) injection  30 mg Subcutaneous Q24H  . FLUoxetine  40 mg Oral Daily  . folic acid  1 mg Oral Daily  . hydrALAZINE   100 mg Oral Q8H  . insulin aspart  0-15 Units Subcutaneous TID WC  . insulin glargine  20 Units Subcutaneous QHS  . isosorbide mononitrate  60 mg Oral Daily  . labetalol  300 mg Oral BID  . multivitamin with minerals  1 tablet Oral Daily  . pantoprazole  40 mg Oral Daily  . thiamine  100 mg Oral Daily   Continuous Infusions: . lactated ringers     PRN Meds:.acetaminophen, albuterol, ALPRAZolam, hydrALAZINE, HYDROcodone-acetaminophen, ipratropium-albuterol, morphine injection, nitroGLYCERIN, ondansetron (ZOFRAN) IV, sodium chloride flush  Antibiotics  :    Anti-infectives (From admission, onward)   Start     Dose/Rate Route Frequency Ordered Stop   10/23/19 1000  remdesivir 100 mg in sodium chloride 0.9 % 100 mL IVPB     100 mg 200 mL/hr over 30 Minutes Intravenous Daily 10/22/19 1713  10/26/19 1006   10/22/19 1800  remdesivir 200 mg in sodium chloride 0.9% 250 mL IVPB     200 mg 580 mL/hr over 30 Minutes Intravenous Once 10/22/19 1713 10/22/19 1843   10/21/19 2100  cefTRIAXone (ROCEPHIN) 1 g in sodium chloride 0.9 % 100 mL IVPB  Status:  Discontinued     1 g 200 mL/hr over 30 Minutes Intravenous Every 24 hours 10/21/19 1744 10/22/19 0748   10/21/19 2100  azithromycin (ZITHROMAX) tablet 500 mg  Status:  Discontinued     500 mg Oral Daily 10/21/19 1744 10/22/19 0748       Time Spent in minutes  30   Lala Lund M.D on 10/26/2019 at 11:57 AM  To page go to [email or web address] - password [facility]  [facility] -  Office  [phone]     See all Orders from today for further details    Objective:   Vitals:   10/26/19 0000 10/26/19 0435 10/26/19 0441 10/26/19 0800  BP: (!) 154/59 (!) 159/64  (!) 165/66  Pulse: 72 70  70  Resp: 17 14    Temp: 98.3 F (36.8 C) 98.5 F (36.9 C)    TempSrc: Oral Oral    SpO2: 100% 98%  100%  Weight:   70.6 kg   Height:        Wt Readings from Last 3 Encounters:  10/26/19 70.6 kg     Intake/Output Summary (Last 24 hours) at  10/26/2019 1157 Last data filed at 10/26/2019 1100 Gross per 24 hour  Intake 590 ml  Output 2050 ml  Net -1460 ml     Physical Exam  Awake Alert, No new F.N deficits, Normal affect [place].AT,PERRAL Supple Neck,No JVD, No cervical lymphadenopathy appriciated.  Symmetrical Chest wall movement, Good air movement bilaterally, CTAB RRR,No Gallops, Rubs or new Murmurs, No Parasternal Heave +ve B.Sounds, Abd Soft, No tenderness, No organomegaly appriciated, No rebound - guarding or rigidity. No Cyanosis,  R. BKA     Data Review:    CBC Recent Labs  Lab 10/22/19 0300 10/22/19 0300 10/22/19 1152 10/23/19 0513 10/24/19 0354 10/25/19 0520 10/26/19 0313  WBC 6.2  --   --  2.6* 3.5* 4.3 5.6  HGB 8.5*   < > 8.8* 8.2* 8.8* 8.2* 8.0*  HCT 27.5*   < > 26.0* 25.7* 27.6* 26.3* 26.4*  PLT 179  --   --  151 173 207 201  MCV 83.1  --   --  79.6* 80.0 82.2 81.2  MCH 25.7*  --   --  25.4* 25.5* 25.6* 24.6*  MCHC 30.9  --   --  31.9 31.9 31.2 30.3  RDW 14.1  --   --  13.6 13.9 13.8 13.6  LYMPHSABS  --   --   --  0.2* 0.2* 0.3* 0.3*  MONOABS  --   --   --  0.2 0.2 0.2 0.2  EOSABS  --   --   --  0.0 0.0 0.0 0.0  BASOSABS  --   --   --  0.0 0.0 0.0 0.0   < > = values in this interval not displayed.    Chemistries  Recent Labs  Lab 10/21/19 1957 10/21/19 1957 10/22/19 0300 10/22/19 0300 10/22/19 1152 10/23/19 0513 10/24/19 0354 10/25/19 0520 10/26/19 0313  NA 137   < > 139   < > 138 135 133* 133* 133*  K 3.7   < > 3.7   < > 3.5 4.0 4.1 4.7 4.8  CL  102   < > 104  --   --  100 97* 98 98  CO2 25   < > 22  --   --  22 23 24 24   GLUCOSE 214*   < > 147*  --   --  270* 281* 381* 296*  BUN 33*   < > 28*  --   --  36* 51* 62* 66*  CREATININE 1.22*   < > 1.22*  --   --  1.66* 2.41* 3.30* 2.91*  CALCIUM 8.3*   < > 8.3*  --   --  8.1* 8.1* 8.0* 8.3*  MG  --   --  1.5*  --   --  1.9 1.8 1.9 1.9  AST 18  --   --   --   --  15 17 17 15   ALT 17  --   --   --   --  17 17 17 16   ALKPHOS 59  --   --    --   --  55 54 54 52  BILITOT 0.8  --   --   --   --  0.5 0.4 0.4 0.4   < > = values in this interval not displayed.   ------------------------------------------------------------------------------------------------------------------ No results for input(s): CHOL, HDL, LDLCALC, TRIG, CHOLHDL, LDLDIRECT in the last 72 hours.  Lab Results  Component Value Date   HGBA1C 6.9 (H) 10/23/2019   ------------------------------------------------------------------------------------------------------------------ No results for input(s): TSH, T4TOTAL, T3FREE, THYROIDAB in the last 72 hours.  Invalid input(s): FREET3  Cardiac Enzymes No results for input(s): CKMB, TROPONINI, MYOGLOBIN in the last 168 hours.  Invalid input(s): CK ------------------------------------------------------------------------------------------------------------------    Component Value Date/Time   BNP 864.8 (H) 10/26/2019 3570    Micro Results Recent Results (from the past 240 hour(s))  SARS CORONAVIRUS 2 (TAT 6-24 HRS) Nasopharyngeal Nasopharyngeal Swab     Status: Abnormal   Collection Time: 10/21/19  6:39 PM   Specimen: Nasopharyngeal Swab  Result Value Ref Range Status   SARS Coronavirus 2 POSITIVE (A) NEGATIVE Final    Comment: RESULT CALLED TO, READ BACK BY AND VERIFIED WITH: K. AJRDY,RN 0017 10/22/2019 T. TYSOR (NOTE) SARS-CoV-2 target nucleic acids are DETECTED. The SARS-CoV-2 RNA is generally detectable in upper and lower respiratory specimens during the acute phase of infection. Positive results are indicative of the presence of SARS-CoV-2 RNA. Clinical correlation with patient history and other diagnostic information is  necessary to determine patient infection status. Positive results do not rule out bacterial infection or co-infection with other viruses.  The expected result is Negative. Fact Sheet for Patients: SugarRoll.be Fact Sheet for Healthcare  Providers: https://www.woods-mathews.com/ This test is not yet approved or cleared by the [place] FDA and  has been authorized for detection and/or diagnosis of SARS-CoV-2 by FDA under an Emergency Use Authorization (EUA). This EUA will remain  in effect (meaning this test can be used) for th e duration of the COVID-19 declaration under Section 564(b)(1) of the Act, 21 U.S.C. section 360bbb-3(b)(1), unless the authorization is terminated or revoked sooner. Performed at [facility], [place] [address], [place], [place] [address]   MRSA PCR Screening     Status: None   Collection Time: 10/22/19  2:51 PM   Specimen: Nasal Mucosa; Nasopharyngeal  Result Value Ref Range Status   MRSA by PCR NEGATIVE NEGATIVE Final    Comment:        The GeneXpert MRSA Assay (FDA approved for NASAL specimens only), is  one component of a comprehensive MRSA colonization surveillance program. It is not intended to diagnose MRSA infection nor to guide or monitor treatment for MRSA infections. Performed at [facility], [place] [address], [place], [place] [address]   Stat Gram stain     Status: None   Collection Time: 10/24/19 11:14 AM   Specimen: Pleura; Body Fluid  Result Value Ref Range Status   Specimen Description PLEURAL RIGHT  Final   Special Requests NONE  Final   Gram Stain   Final    RARE WBC PRESENT, PREDOMINANTLY MONONUCLEAR NO ORGANISMS SEEN Performed at [facility], [address], [place], [place] [address]    Report Status 10/24/2019 FINAL  Final  Respiratory Panel by PCR     Status: None   Collection Time: 10/25/19  4:18 AM   Specimen: Nasopharyngeal Swab; Respiratory  Result Value Ref Range Status   Adenovirus NOT DETECTED NOT DETECTED Final   Coronavirus 229E NOT DETECTED NOT DETECTED Final    Comment: (NOTE) The Coronavirus on the Respiratory Panel, DOES NOT test for the novel  Coronavirus (2019 nCoV)    Coronavirus HKU1 NOT DETECTED NOT  DETECTED Final   Coronavirus NL63 NOT DETECTED NOT DETECTED Final   Coronavirus OC43 NOT DETECTED NOT DETECTED Final   Metapneumovirus NOT DETECTED NOT DETECTED Final   Rhinovirus / Enterovirus NOT DETECTED NOT DETECTED Final   Influenza A NOT DETECTED NOT DETECTED Final   Influenza B NOT DETECTED NOT DETECTED Final   Parainfluenza Virus 1 NOT DETECTED NOT DETECTED Final   Parainfluenza Virus 2 NOT DETECTED NOT DETECTED Final   Parainfluenza Virus 3 NOT DETECTED NOT DETECTED Final   Parainfluenza Virus 4 NOT DETECTED NOT DETECTED Final   Respiratory Syncytial Virus NOT DETECTED NOT DETECTED Final   Bordetella pertussis NOT DETECTED NOT DETECTED Final   Chlamydophila pneumoniae NOT DETECTED NOT DETECTED Final   Mycoplasma pneumoniae NOT DETECTED NOT DETECTED Final    Comment: Performed at [facility], [place]. [address] [place], [place] [address]  Body fluid culture (includes gram stain)     Status: None (Preliminary result)   Collection Time: 10/25/19  4:29 PM   Specimen: Pleural Fluid  Result Value Ref Range Status   Specimen Description FLUID PLEURAL  Final   Special Requests NONE  Final   Gram Stain   Final    FEW WBC PRESENT, PREDOMINANTLY MONONUCLEAR NO ORGANISMS SEEN    Culture   Final    NO GROWTH < 24 HOURS Performed at [facility], [place] [address], [place], [place] [address]    Report Status PENDING  Incomplete    Radiology Reports CT ABDOMEN PELVIS W CONTRAST  Result Date: 10/22/2019 CLINICAL DATA:  Diffuse abdominal pain and anemia. EXAM: CT ABDOMEN AND PELVIS WITH CONTRAST TECHNIQUE: Multidetector CT imaging of the abdomen and pelvis was performed using the standard protocol following bolus administration of intravenous contrast. CONTRAST:  67m OMNIPAQUE IOHEXOL 300 MG/ML  SOLN COMPARISON:  CT scan 10/19/2019 FINDINGS: Lower chest: Persistent moderate to large bilateral pleural effusions with significant overlying atelectasis. The heart is normal in size. No  pericardial effusion. Age advanced coronary artery calcifications are noted. Hepatobiliary: No focal hepatic lesions or intrahepatic biliary dilatation. Ill-defined area of contrast enhancement and segment 7 likely a vascular shunt. The gallbladder is mildly contracted. No common bile duct dilatation. Pancreas: Scattered calcifications or likely due to prior inflammation. No worrisome pancreatic lesions or acute inflammatory process. No ductal  dilatation. Spleen: Normal size. No focal lesions. Adrenals/Urinary Tract: The adrenal glands and kidneys are unremarkable. No worrisome renal lesions or hydronephrosis. The bladder appears normal. Stomach/Bowel: The stomach, duodenum, small bowel and colon are grossly normal without oral contrast. No acute inflammatory changes, mass lesions or obstructive findings. Vascular/Lymphatic: Severe/advanced atherosclerotic calcifications involving the aorta and branch vessels for age. No aneurysm or dissection. The major venous structures are patent. No mesenteric or retroperitoneal mass or adenopathy. Reproductive: The uterus and ovaries are unremarkable. Calcified fibroid again noted posteriorly. Other: Small to moderate amount of free pelvic fluid is again demonstrated. No obvious cause. Musculoskeletal: Stable compression fracture of L1. No worrisome bone lesions. Age advanced osteoporosis. IMPRESSION: 1. Persistent moderate to large bilateral pleural effusions with overlying atelectasis. 2. No acute abdominal/pelvic findings, mass lesions or adenopathy. 3. Severe/advanced atherosclerotic calcifications involving the aorta and branch vessels for age. 4. Small to moderate amount of persistent free pelvic fluid. No obvious cause. 5. Calcified uterine fibroids. Aortic Atherosclerosis (ICD10-I70.0). Electronically Signed   By: Marijo Sanes M.D.   On: 10/22/2019 13:56   US RENAL  Result Date: 10/25/2019 CLINICAL DATA:  Acute renal failure EXAM: RENAL / URINARY TRACT ULTRASOUND  COMPLETE COMPARISON:  None. FINDINGS: Right Kidney: Renal measurements: 13.5 x 5.7 x 4.7 cm. = volume: 188 mL . Echogenicity within normal limits. No mass or hydronephrosis visualized. Left Kidney: Renal measurements: 14.2 x 4.9 x 4.3 cm = volume: 157 mL. Echogenicity within normal limits. No mass or hydronephrosis visualized. Bladder: Decompressed by Foley catheter. Other: None. IMPRESSION: No acute abnormality noted. Electronically Signed   By: Inez Catalina M.D.   On: 10/25/2019 22:11   DG Chest Port 1 View  Result Date: 10/26/2019 CLINICAL DATA:  Shortness of breath, COVID positive EXAM: PORTABLE CHEST 1 VIEW COMPARISON:  10/25/2019 FINDINGS: Right-sided PICC line terminates in the distal superior vena cava. Heart size is stable and normal accounting for projection. Increasing opacity at the left lung base without silhouetting of left hemidiaphragm. Subtle opacities are seen on a background of mild interstitial prominence worse in the left lung base, also present in right mid chest and lower chest. Visualized skeletal structures are unremarkable. IMPRESSION: 1. Increasing opacity at the left lung base without silhouetting of the left hemidiaphragm. Findings may represent developing effusion. 2. Subtle opacities in the right mid chest and lower chest. Findings could be seen in the setting of COVID-19 pneumonia. Continued follow-up suggested. Electronically Signed   By: Zetta Bills M.D.   On: 10/26/2019 08:59   DG Chest Port 1 View  Result Date: 10/25/2019 CLINICAL DATA:  Status post thoracentesis. EXAM: PORTABLE CHEST 1 VIEW COMPARISON:  Radiograph earlier this day. Lung bases from abdominal CT 10/22/2019 FINDINGS: Decreased left pleural effusion. No visualized pneumothorax. Hazy opacity at the right lung base likely small right pleural effusion and atelectasis. Right upper extremity PICC remains in place. Borderline cardiomegaly with unchanged mediastinal contours. Improved pulmonary edema from earlier  this day. Bones are under mineralized. IMPRESSION: 1. Decreased left pleural effusion after thoracentesis. No visualized pneumothorax. 2. Hazy opacity at the right lung base likely small right pleural effusion and atelectasis. 3. Improving pulmonary edema. Electronically Signed   By: Keith Rake M.D.   On: 10/25/2019 17:17   DG Chest Port 1 View  Result Date: 10/25/2019 CLINICAL DATA:  Status post thoracentesis. COVID positive. EXAM: PORTABLE CHEST 1 VIEW COMPARISON:  One-view chest x-ray 10/23/19 FINDINGS: Heart size is normal. Right-sided PICC line terminates in the distal SVC.  Left pleural effusion and airspace disease is improved. No pneumothorax is present. Mild edema is present. IMPRESSION: 1. Improving left pleural effusion and airspace disease. 2. No pneumothorax. 3. Mild edema. Electronically Signed   By: San Morelle M.D.   On: 10/25/2019 06:57   DG CHEST PORT 1 VIEW  Result Date: 10/23/2019 CLINICAL DATA:  Acute respiratory insufficiency EXAM: PORTABLE CHEST 1 VIEW COMPARISON:  October 22, 2019 FINDINGS: The heart size and mediastinal contours are unchanged with mild cardiomegaly. There is a small left pleural effusion. There is slight interval improvement in the hazy/interstitial markings throughout both lungs. No acute osseous abnormality. IMPRESSION: Slight interval improvement in the bilateral hazy airspace opacities which could be due to pulmonary edema or infectious etiology. Small left pleural effusion. Electronically Signed   By: Prudencio Pair M.D.   On: 10/23/2019 06:32   DG Chest Port 1 View  Result Date: 10/22/2019 CLINICAL DATA:  CHF respiratory distress.  COVID-19 positive EXAM: PORTABLE CHEST 1 VIEW COMPARISON:  10/20/2019 FINDINGS: Diffuse bilateral airspace disease with basilar predominance shows mild progression. Moderate left effusion has improved in the interval. Probable small right effusion. IMPRESSION: Mild progression of diffuse bilateral airspace disease which may  represent edema or pneumonia. Improvement in left pleural effusion. Electronically Signed   By: Franchot Gallo M.D.   On: 10/22/2019 09:28   ECHOCARDIOGRAM COMPLETE  Result Date: 10/23/2019    ECHOCARDIOGRAM REPORT   Patient Name:   Ruthanna Hogate  Date of Exam: 10/23/2019 Medical Rec #:  [id number]  Height:       63.0 in Accession #:    [id number] Weight:       156.5 lb Date of Birth:  [birth year] BSA:          1.742 m Patient Age:    82 years   BP:           124/65 mmHg Patient Gender: F          HR:           66 bpm. Exam Location:  Inpatient Procedure: 2D Echo, Cardiac Doppler and Color Doppler Indications:    I50.33 Acute on chronic diastolic (congestive) heart failure  History:        Patient has no prior history of Echocardiogram examinations.                 COPD, TIA and PAD; Risk Factors:Hypertension and Diabetes.                 COVID-19 Positive.  Sonographer:    Jonelle Sidle Dance Referring Phys: Wayne  1. Left ventricular ejection fraction, by estimation, is 55 to 60%. The left ventricle has normal function. The left ventricle has no regional wall motion abnormalities. Left ventricular diastolic parameters are consistent with Grade II diastolic dysfunction (pseudonormalization). Elevated left atrial pressure.  2. Right ventricular systolic function is normal. The right ventricular size is normal. Tricuspid regurgitation signal is inadequate for assessing PA pressure.  3. Left atrial size was moderately dilated.  4. Large pleural effusion in the left lateral region.  5. The mitral valve is normal in structure and function. Mild mitral valve regurgitation.  6. The aortic valve is normal in structure and function. Aortic valve regurgitation is not visualized.  7. The inferior vena cava is dilated in size with >50% respiratory variability, suggesting right atrial pressure of 8 mmHg. Comparison(s): No prior Echocardiogram. FINDINGS  Left Ventricle: Left ventricular ejection fraction, by  estimation, is  55 to 60%. The left ventricle has normal function. The left ventricle has no regional wall motion abnormalities. The left ventricular internal cavity size was normal in size. There is  no left ventricular hypertrophy. Left ventricular diastolic parameters are consistent with Grade II diastolic dysfunction (pseudonormalization). Elevated left atrial pressure. Right Ventricle: The right ventricular size is normal. No increase in right ventricular wall thickness. Right ventricular systolic function is normal. Tricuspid regurgitation signal is inadequate for assessing PA pressure. Left Atrium: Left atrial size was moderately dilated. Right Atrium: Right atrial size was normal in size. Pericardium: There is no evidence of pericardial effusion. Mitral Valve: The mitral valve is normal in structure and function. Mild mitral annular calcification. Mild mitral valve regurgitation. Tricuspid Valve: The tricuspid valve is normal in structure. Tricuspid valve regurgitation is not demonstrated. Aortic Valve: The aortic valve is normal in structure and function. Aortic valve regurgitation is not visualized. Pulmonic Valve: The pulmonic valve was grossly normal. Pulmonic valve regurgitation is not visualized. Aorta: The aortic root and ascending aorta are structurally normal, with no evidence of dilitation. Venous: The inferior vena cava is dilated in size with greater than 50% respiratory variability, suggesting right atrial pressure of 8 mmHg. IAS/Shunts: No atrial level shunt detected by color flow Doppler. Additional Comments: There is a large pleural effusion in the left lateral region.  LEFT VENTRICLE PLAX 2D LVIDd:         3.79 cm  Diastology LVIDs:         2.87 cm  LV e' lateral:   6.00 cm/s LV PW:         1.27 cm  LV E/e' lateral: 18.3 LV IVS:        0.94 cm  LV e' medial:    3.57 cm/s LVOT diam:     1.80 cm  LV E/e' medial:  30.8 LV SV:         52 LV SV Index:   30 LVOT Area:     2.54 cm  RIGHT VENTRICLE             IVC RV Basal diam:  3.24 cm    IVC diam: 2.47 cm RV Mid diam:    1.67 cm RV S prime:     9.32 cm/s TAPSE (M-mode): 1.8 cm LEFT ATRIUM             Index       RIGHT ATRIUM           Index LA diam:        3.90 cm 2.24 cm/m  RA Area:     15.50 cm LA Vol (A2C):   79.2 ml 45.46 ml/m RA Volume:   42.40 ml  24.33 ml/m LA Vol (A4C):   61.2 ml 35.12 ml/m LA Biplane Vol: 73.7 ml 42.30 ml/m  AORTIC VALVE LVOT Vmax:   89.80 cm/s LVOT Vmean:  62.000 cm/s LVOT VTI:    0.204 m  AORTA Ao Root diam: 2.80 cm Ao Asc diam:  2.40 cm MITRAL VALVE MV Area (PHT): 3.72 cm     SHUNTS MV Decel Time: 204 msec     Systemic VTI:  0.20 m MV E velocity: 110.00 cm/s  Systemic Diam: 1.80 cm MV A velocity: 54.30 cm/s MV E/A ratio:  2.03 Mihai Croitoru MD Electronically signed by Sanda Klein MD Signature Date/Time: 10/23/2019/3:50:43 PM    Final    [place] EKG SITE RITE  Result Date: 10/22/2019 If Site Rite image not attached, placement could  not be confirmed due to current cardiac rhythm.  US Abdomen Limited RUQ  Result Date: 10/22/2019 CLINICAL DATA:  Acute right upper quadrant abdominal pain. EXAM: ULTRASOUND ABDOMEN LIMITED RIGHT UPPER QUADRANT COMPARISON:  October 19, 2019. FINDINGS: Gallbladder: No gallstones or wall thickening visualized. No sonographic Murphy sign noted by sonographer. Common bile duct: Diameter: 5 mm which is within normal limits. Liver: No focal lesion identified. Within normal limits in parenchymal echogenicity. Portal vein is patent on color Doppler imaging with normal direction of blood flow towards the liver. Other: Minimal ascites is noted. IMPRESSION: Minimal ascites. No other abnormality seen in the right upper quadrant of the abdomen. Electronically Signed   By: Marijo Conception M.D.   On: 10/22/2019 09:03

## 2019-10-27 ENCOUNTER — Inpatient Hospital Stay (HOSPITAL_COMMUNITY): Payer: Medicaid Other

## 2019-10-27 DIAGNOSIS — Z9889 Other specified postprocedural states: Secondary | ICD-10-CM

## 2019-10-27 DIAGNOSIS — R0602 Shortness of breath: Secondary | ICD-10-CM

## 2019-10-27 DIAGNOSIS — N171 Acute kidney failure with acute cortical necrosis: Secondary | ICD-10-CM

## 2019-10-27 LAB — GLUCOSE, CAPILLARY
Glucose-Capillary: 182 mg/dL — ABNORMAL HIGH (ref 70–99)
Glucose-Capillary: 295 mg/dL — ABNORMAL HIGH (ref 70–99)
Glucose-Capillary: 191 mg/dL — ABNORMAL HIGH (ref 70–99)
Glucose-Capillary: 142 mg/dL — ABNORMAL HIGH (ref 70–99)

## 2019-10-27 LAB — COMPREHENSIVE METABOLIC PANEL
ALT: 17 U/L (ref 0–44)
AST: 17 U/L (ref 15–41)
Albumin: 2.2 g/dL — ABNORMAL LOW (ref 3.5–5.0)
Alkaline Phosphatase: 55 U/L (ref 38–126)
Anion gap: 8 (ref 5–15)
BUN: 58 mg/dL — ABNORMAL HIGH (ref 6–20)
CO2: 27 mmol/L (ref 22–32)
Calcium: 8.5 mg/dL — ABNORMAL LOW (ref 8.9–10.3)
Chloride: 101 mmol/L (ref 98–111)
Creatinine, Ser: 2.35 mg/dL — ABNORMAL HIGH (ref 0.44–1.00)
GFR calc Af Amer: 25 mL/min — ABNORMAL LOW (ref >60)
GFR calc non Af Amer: 22 mL/min — ABNORMAL LOW (ref >60)
Glucose, Bld: 194 mg/dL — ABNORMAL HIGH (ref 70–99)
Potassium: 4.3 mmol/L (ref 3.5–5.1)
Sodium: 136 mmol/L (ref 135–145)
Total Bilirubin: 0.4 mg/dL (ref 0.3–1.2)
Total Protein: 4.9 g/dL — ABNORMAL LOW (ref 6.5–8.1)

## 2019-10-27 LAB — CBC WITH DIFFERENTIAL/PLATELET
Abs Immature Granulocytes: 0.14 10*3/uL — ABNORMAL HIGH (ref 0.00–0.07)
Basophils Absolute: 0 10*3/uL (ref 0.0–0.1)
Basophils Relative: 0 %
Eosinophils Absolute: 0.1 10*3/uL (ref 0.0–0.5)
Eosinophils Relative: 1 %
HCT: 27.1 % — ABNORMAL LOW (ref 36.0–46.0)
Hemoglobin: 8.5 g/dL — ABNORMAL LOW (ref 12.0–15.0)
Immature Granulocytes: 3 %
Lymphocytes Relative: 13 %
Lymphs Abs: 0.7 10*3/uL (ref 0.7–4.0)
MCH: 25.3 pg — ABNORMAL LOW (ref 26.0–34.0)
MCHC: 31.4 g/dL (ref 30.0–36.0)
MCV: 80.7 fL (ref 80.0–100.0)
Monocytes Absolute: 0.5 10*3/uL (ref 0.1–1.0)
Monocytes Relative: 9 %
Neutro Abs: 4.2 10*3/uL (ref 1.7–7.7)
Neutrophils Relative %: 74 %
Platelets: 198 10*3/uL (ref 150–400)
RBC: 3.36 MIL/uL — ABNORMAL LOW (ref 3.87–5.11)
RDW: 13.6 % (ref 11.5–15.5)
WBC: 5.6 10*3/uL (ref 4.0–10.5)
nRBC: 0 % (ref 0.0–0.2)

## 2019-10-27 LAB — PROCALCITONIN: Procalcitonin: 0.31 ng/mL

## 2019-10-27 LAB — C-REACTIVE PROTEIN: CRP: 0.6 mg/dL (ref <1.0)

## 2019-10-27 LAB — MAGNESIUM: Magnesium: 1.8 mg/dL (ref 1.7–2.4)

## 2019-10-27 LAB — BRAIN NATRIURETIC PEPTIDE: B Natriuretic Peptide: 822.5 pg/mL — ABNORMAL HIGH (ref 0.0–100.0)

## 2019-10-27 LAB — D-DIMER, QUANTITATIVE: D-Dimer, Quant: 3.02 ug/mL-FEU — ABNORMAL HIGH (ref 0.00–0.50)

## 2019-10-27 MED ORDER — FUROSEMIDE 40 MG PO TABS
60.0000 mg | ORAL_TABLET | Freq: Every day | ORAL | Status: DC
  Administered 2019-10-27: 60 mg via ORAL
  Filled 2019-10-27: qty 1

## 2019-10-27 MED ORDER — MORPHINE SULFATE (PF) 2 MG/ML IV SOLN: 0.5000 mg | Freq: Three times a day (TID) | INTRAVENOUS | Status: DC | PRN

## 2019-10-27 MED ORDER — LORAZEPAM 2 MG/ML IJ SOLN
1.0000 mg | Freq: Once | INTRAMUSCULAR | Status: AC
  Administered 2019-10-28: 1 mg via INTRAVENOUS
  Filled 2019-10-27: qty 1

## 2019-10-27 NOTE — Progress Notes (Addendum)
PROGRESS NOTE                                                                                                                                                                                                             Patient Demographics:    Jamie [name], is a 59 y.o. 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    She was recently hospitalized twice to [facility] for shortness of breath, her work-up showed bilateral transudative pleural effusion, she had thoracentesis suggesting transudative fluid with no malignant cells.  Reason for recurrent effusion was thought to be acute on chronic diastolic CHF.  She was also found to have liver mass.  She was finally transferred to [facility] for cardiology evaluation for recurrent CHF.   Subjective:   Patient in bed, appears comfortable, denies any headache, no fever, no chest pain or pressure, no shortness of breath , no abdominal pain. No focal weakness.    Assessment  & Plan :    1.  Severe Acute hypoxic respiratory failure due to recurrent Acute on chronic diastolic CHF EF 76% .  Recurrent bilateral pleural effusions which are transudate if worked up in [place], EF 60%.  She was in severe respiratory distress morning of November 17, 2019 and almost coded, he also had evidence of hypertensive urgency likely due to respiratory distress.    She was started on high-dose IV Lasix, IV nitro drip  and blood pressure medications, she has diuresed very well although she had incontinence and output was not recorded, respiratory distress is much improved on 10/23/2019 that is 24 hours after the treatment regimen.  She will be continued on oral beta-blocker, hydralazine, Nitropaste and Lasix ( held since 10/24/2019 due to AKI) combination.  She was also seen by pulmonary underwent right-sided thoracentesis with 750 cc of  serosanguineous fluid removed on 10/24/2019 and left-sided thoracentesis with 1300 cc of fluid removed on 10/25/2019, it seems to be consistent with transudate as well, per pulmonary physician Dr. Elsworth Soho who communicated to me on 10/26/2019 fluid is transudative and not suspicious of malignant effusion, cytology from both sides showing mesothelial cells.  Continue diuresis and monitor.  If pleural fluid continues to recur patient now wants Pleurx catheter and directed her care more towards comfort.  Will repeat chest x-ray on 10/28/2019.  For now continue Lasix as tolerated by her blood pressure and renal function.   Case was also discussed with cardiologist Dr. Loralie Champagne on 10/26/2019 who will follow the patient in the office.  Nothing else to offer at this time except continue diuresis and medical management as we are doing.   2. Acute Covid 19 Viral Pneumonitis during the ongoing 2020 Covid 19 Pandemic - her main disease process and hypoxia is due to #1 above which is acute on chronic diastolic CHF.  Her Covid disease itself seems to be mild to moderate for which he has completed her treatment with remdesivir and steroids.  Encouraged the patient to sit up in chair in the daytime use I-S and flutter valve for pulmonary toiletry and then prone in bed when at night.  SpO2: 97 % O2 Flow Rate (L/min): 2 L/min FiO2 (%): 40 %  Recent Labs  Lab 10/21/19 1839 10/21/19 1957 10/22/19 1019 10/23/19 0513 10/24/19 0354 10/25/19 0520 10/25/19 0521 10/26/19 0313 10/27/19 0418  CRP  --    < > 6.0*  6.5* 3.3* 1.5*  --  0.8 0.6  DDIMER  --    < > 3.75* 3.29* 3.41* 3.24*  --  3.02* 3.02*  FERRITIN  --   --  177  --   --   --   --   --   --   BNP  --    < >  --  2,187.7* 1,140.9*  --  945.6* 864.8* 822.5*  PROCALCITON  --    < > 0.22 0.28 0.32 0.37  --  0.32 0.31  SARSCOV2NAA POSITIVE*  --   --   --   --   --   --   --   --    < > = values in this interval not displayed.    Hepatic Function Latest Ref Rng & Units 10/27/2019 10/26/2019 10/25/2019  Total Protein 6.5 - 8.1 g/dL 4.9(L) 5.2(L) 5.4(L)  Albumin 3.5 - 5.0 g/dL 2.2(L) 2.3(L) -  AST 15 - 41 U/L 17 15 -  ALT 0 - 44 U/L 17 16 -  Alk Phosphatase 38 - 126 U/L 55 52 -  Total Bilirubin 0.3 - 1.2 mg/dL 0.4 0.4 -      3.  PAD.  S/p right BKA, CT evidence of abdominal diffuse calcification with recurrent abdominal pain.  Highly questionable for ischemic bowel, for now continue aspirin and statin for secondary prevention, clear liquid diet and monitor.  She likely has diffuse atherosclerosis due to uncontrolled diabetes mellitus and hypertension.  She denies any history of smoking, with supportive care she is somewhat better and tolerating liquid diet.  4.  Hypertensive crisis. Stopped  IV Clevidipine drip, oral medications adjusted and as needed IV hydralazine added.  Currently on combination of beta-blocker, Norvasc, clonidine, hydralazine along with Imdur.   5.  GERD with nausea.  Also has HX of diabetic gastroparesis.  For now supportive care with clear liquid diet, PPI and nausea medications.  6.  Dyslipidemia.  Continue statin.  7.  Anemia of chronic disease.  Monitor.  8.  Hypertensive urgency.  Resolved.  9.  Depression and anxiety.  Continue combination of bupropion, fluoxetine, buspirone and Xanax.  10.  AKI at [facility] with creatinine of 1.5.  Renal function worse likely due to combination of IV contrast from CT and diuresis, Lasix was held and she was gently hydrated for 2 days from 10/25/2019 to 10/26/2019 with total of 2 L of  normal saline, creatinine has stabilized and now trending down, will commence low-dose Lasix on 10/27/2019 as she is developing crackles and evidence of reoccurrence of pleural effusion.  11. Liver mass.  Needs abdominal MRI once pulmonary status is improved.  12. DM2 - placed on Lantus sliding scale along with Premeal NovoLog.  Monitor and adjust  Lab Results  Component Value Date   HGBA1C 6.9 (H) 10/23/2019     CBG (last 3)  Recent Labs    10/26/19 1727 10/26/19 2049 10/27/19 0826  GLUCAP 187* 214* 182*     Condition - Extremely Guarded  Family Communication  :  Daughter in Radiographer, therapeutic on 10/22/19 @ [phone], called on 10/23/2019 at 10:20 AM - full mailbox.  Code Status :  Full  Diet :   Diet Order            Diet Heart Room service appropriate? Yes; Fluid consistency: Thin; Fluid restriction: 1200 mL Fluid  Diet effective now               Disposition Plan  : PCU currently awaiting renal function to improve for left heart cath, has recurrent flash pulmonary edema indicating underlying cardiac etiology.  Also awaiting improvement of renal function has developed AKI.  Consults  : Pulmonary, cardiology  Procedures  :    Ultrasound-guided right-sided thoracentesis on 10/24/2019 with 750 cc of fluid removed.  Done by pulmonary.  Pending left-sided thoracentesis.  Renal ultrasound.  CT - 1. Persistent moderate to large bilateral pleural effusions with overlying atelectasis. 2. No acute abdominal/pelvic findings, mass lesions or adenopathy. 3. Severe/advanced atherosclerotic calcifications involving the aorta and branch vessels for age. 4. Small to moderate amount of persistent free pelvic fluid. No obvious cause. 5. Calcified uterine fibroids. Aortic Atherosclerosis.  TTE from [facility] - EF 60%, dCHF  PICC line and Foley  requested.  PUD Prophylaxis : PPI  DVT Prophylaxis  :  Lovenox    Lab Results  Component Value Date   PLT 198 10/27/2019    Inpatient  Medications  Scheduled Meds: . amLODipine  10 mg Oral Daily  . aspirin EC  81 mg Oral Daily  . atorvastatin  80 mg Oral QHS  . busPIRone  5 mg Oral Daily  . Chlorhexidine Gluconate Cloth  6 each Topical Daily  . clonazepam  0.25 mg Oral BID  . cloNIDine  0.2 mg Oral BID  . enoxaparin (LOVENOX) injection  30 mg Subcutaneous Q24H  . FLUoxetine  40 mg Oral Daily  . folic acid  1 mg Oral Daily  . furosemide  60 mg Oral Daily  . hydrALAZINE  100 mg Oral Q8H  . insulin aspart  0-15 Units Subcutaneous TID WC  . insulin aspart  0-5 Units Subcutaneous QHS  . insulin glargine  25 Units Subcutaneous QHS  . isosorbide mononitrate  60 mg Oral Daily  . labetalol  300 mg Oral BID  . multivitamin with minerals  1 tablet Oral Daily  . pantoprazole  40 mg  Oral Daily  . thiamine  100 mg Oral Daily   Continuous Infusions:  PRN Meds:.acetaminophen, albuterol, ALPRAZolam, hydrALAZINE, HYDROcodone-acetaminophen, ipratropium-albuterol, morphine injection, nitroGLYCERIN, ondansetron (ZOFRAN) IV, sodium chloride flush  Antibiotics  :    Anti-infectives (From admission, onward)   Start     Dose/Rate Route Frequency Ordered Stop   10/23/19 1000  remdesivir 100 mg in sodium chloride 0.9 % 100 mL IVPB     100 mg 200 mL/hr over 30 Minutes Intravenous Daily 10/22/19 1713 10/26/19 1006   10/22/19 1800  remdesivir 200 mg in sodium chloride 0.9% 250 mL IVPB     200 mg 580 mL/hr over 30 Minutes Intravenous Once 10/22/19 1713 10/22/19 1843   10/21/19 2100  cefTRIAXone (ROCEPHIN) 1 g in sodium chloride 0.9 % 100 mL IVPB  Status:  Discontinued     1 g 200 mL/hr over 30 Minutes Intravenous Every 24 hours 10/21/19 1744 10/22/19 0748   10/21/19 2100  azithromycin (ZITHROMAX) tablet 500 mg  Status:  Discontinued     500 mg Oral Daily 10/21/19 1744 10/22/19 0748       Time Spent in minutes  30   Lala Lund M.D on 10/27/2019 at 9:54 AM  To page go to [email or web address] - password [facility]  [facility] -   Office  [phone]     See all Orders from today for further details    Objective:   Vitals:   10/26/19 1700 10/26/19 2000 10/27/19 0458 10/27/19 0837  BP: (!) 155/72 (!) 149/58 (!) 155/53   Pulse: 69 70 73   Resp:  11 19   Temp:  98.2 F (36.8 C) 97.8 F (36.6 C) 98.4 F (36.9 C)  TempSrc:  Oral Oral Oral  SpO2: 100% 98% 96% 97%  Weight:   73.1 kg   Height:        Wt Readings from Last 3 Encounters:  10/27/19 73.1 kg     Intake/Output Summary (Last 24 hours) at 10/27/2019 0954 Last data filed at 10/27/2019 0920 Gross per 24 hour  Intake 1376.38 ml  Output 1800 ml  Net -423.62 ml     Physical Exam  Awake Alert, No new F.N deficits, Normal affect [place].AT,PERRAL Supple Neck,No JVD, No cervical lymphadenopathy appriciated.  Symmetrical Chest wall movement, Good air movement bilaterally, few bibasilar rales RRR,No Gallops, Rubs or new Murmurs, No Parasternal Heave +ve B.Sounds, Abd Soft, No tenderness, No organomegaly appriciated, No rebound - guarding or rigidity. No Cyanosis,  R. BKA     Data Review:    CBC Recent Labs  Lab 10/23/19 0513 10/24/19 0354 10/25/19 0520 10/26/19 0313 10/27/19 0418  WBC 2.6* 3.5* 4.3 5.6 5.6  HGB 8.2* 8.8* 8.2* 8.0* 8.5*  HCT 25.7* 27.6* 26.3* 26.4* 27.1*  PLT 151 173 207 201 198  MCV 79.6* 80.0 82.2 81.2 80.7  MCH 25.4* 25.5* 25.6* 24.6* 25.3*  MCHC 31.9 31.9 31.2 30.3 31.4  RDW 13.6 13.9 13.8 13.6 13.6  LYMPHSABS 0.2* 0.2* 0.3* 0.3* 0.7  MONOABS 0.2 0.2 0.2 0.2 0.5  EOSABS 0.0 0.0 0.0 0.0 0.1  BASOSABS 0.0 0.0 0.0 0.0 0.0    Chemistries  Recent Labs  Lab 10/23/19 0513 10/24/19 0354 10/25/19 0520 10/26/19 0313 10/27/19 0418  NA 135 133* 133* 133* 136  K 4.0 4.1 4.7 4.8 4.3  CL 100 97* 98 98 101  CO2 22 23 24 24 27   GLUCOSE 270* 281* 381* 296* 194*  BUN 36* 51* 62* 66* 58*  CREATININE 1.66* 2.41* 3.30*  2.91* 2.35*  CALCIUM 8.1* 8.1* 8.0* 8.3* 8.5*  MG 1.9 1.8 1.9 1.9 1.8  AST 15 17 17 15 17   ALT 17 17 17  16 17   ALKPHOS 55 54 54 52 55  BILITOT 0.5 0.4 0.4 0.4 0.4   ------------------------------------------------------------------------------------------------------------------ No results for input(s): CHOL, HDL, LDLCALC, TRIG, CHOLHDL, LDLDIRECT in the last 72 hours.  Lab Results  Component Value Date   HGBA1C 6.9 (H) 10/23/2019   ------------------------------------------------------------------------------------------------------------------ No results for input(s): TSH, T4TOTAL, T3FREE, THYROIDAB in the last 72 hours.  Invalid input(s): FREET3  Cardiac Enzymes No results for input(s): CKMB, TROPONINI, MYOGLOBIN in the last 168 hours.  Invalid input(s): CK ------------------------------------------------------------------------------------------------------------------    Component Value Date/Time   BNP 822.5 (H) 10/27/2019 0418    Micro Results Recent Results (from the past 240 hour(s))  SARS CORONAVIRUS 2 (TAT 6-24 HRS) Nasopharyngeal Nasopharyngeal Swab     Status: Abnormal   Collection Time: 10/21/19  6:39 PM   Specimen: Nasopharyngeal Swab  Result Value Ref Range Status   SARS Coronavirus 2 POSITIVE (A) NEGATIVE Final    Comment: RESULT CALLED TO, READ BACK BY AND VERIFIED WITH: K. AJRDY,RN 0017 10/22/2019 T. TYSOR (NOTE) SARS-CoV-2 target nucleic acids are DETECTED. The SARS-CoV-2 RNA is generally detectable in upper and lower respiratory specimens during the acute phase of infection. Positive results are indicative of the presence of SARS-CoV-2 RNA. Clinical correlation with patient history and other diagnostic information is  necessary to determine patient infection status. Positive results do not rule out bacterial infection or co-infection with other viruses.  The expected result is Negative. Fact Sheet for Patients: SugarRoll.be Fact Sheet for Healthcare Providers: https://www.woods-mathews.com/ This test is not yet  approved or cleared by the [place] FDA and  has been authorized for detection and/or diagnosis of SARS-CoV-2 by FDA under an Emergency Use Authorization (EUA). This EUA will remain  in effect (meaning this test can be used) for th e duration of the COVID-19 declaration under Section 564(b)(1) of the Act, 21 U.S.C. section 360bbb-3(b)(1), unless the authorization is terminated or revoked sooner. Performed at [facility], [place] [address], [place], [place] [address]   MRSA PCR Screening     Status: None   Collection Time: 10/22/19  2:51 PM   Specimen: Nasal Mucosa; Nasopharyngeal  Result Value Ref Range Status   MRSA by PCR NEGATIVE NEGATIVE Final    Comment:        The GeneXpert MRSA Assay (FDA approved for NASAL specimens only), is one component of a comprehensive MRSA colonization surveillance program. It is not intended to diagnose MRSA infection nor to guide or monitor treatment for MRSA infections. Performed at [facility], [place] [address] [place], [place] [address]   Stat Gram stain     Status: None   Collection Time: 10/24/19 11:14 AM   Specimen: Pleura; Body Fluid  Result Value Ref Range Status   Specimen Description PLEURAL RIGHT  Final   Special Requests NONE  Final   Gram Stain   Final    RARE WBC PRESENT, PREDOMINANTLY MONONUCLEAR NO ORGANISMS SEEN Performed at [facility], [address], [place], [place] [address]    Report Status 10/24/2019 FINAL  Final  Respiratory Panel by PCR     Status: None   Collection Time: 10/25/19  4:18 AM   Specimen: Nasopharyngeal Swab; Respiratory  Result Value Ref Range Status   Adenovirus NOT DETECTED NOT DETECTED Final   Coronavirus 229E NOT  DETECTED NOT DETECTED Final    Comment: (NOTE) The Coronavirus on the Respiratory Panel, DOES NOT test for the novel  Coronavirus (2019 nCoV)    Coronavirus HKU1 NOT DETECTED NOT DETECTED Final   Coronavirus NL63 NOT DETECTED NOT DETECTED Final   Coronavirus  OC43 NOT DETECTED NOT DETECTED Final   Metapneumovirus NOT DETECTED NOT DETECTED Final   Rhinovirus / Enterovirus NOT DETECTED NOT DETECTED Final   Influenza A NOT DETECTED NOT DETECTED Final   Influenza B NOT DETECTED NOT DETECTED Final   Parainfluenza Virus 1 NOT DETECTED NOT DETECTED Final   Parainfluenza Virus 2 NOT DETECTED NOT DETECTED Final   Parainfluenza Virus 3 NOT DETECTED NOT DETECTED Final   Parainfluenza Virus 4 NOT DETECTED NOT DETECTED Final   Respiratory Syncytial Virus NOT DETECTED NOT DETECTED Final   Bordetella pertussis NOT DETECTED NOT DETECTED Final   Chlamydophila pneumoniae NOT DETECTED NOT DETECTED Final   Mycoplasma pneumoniae NOT DETECTED NOT DETECTED Final    Comment: Performed at [facility], [place] [address] [place], [place] [address]  Body fluid culture (includes gram stain)     Status: None (Preliminary result)   Collection Time: 10/25/19  4:29 PM   Specimen: Pleural Fluid  Result Value Ref Range Status   Specimen Description FLUID PLEURAL  Final   Special Requests NONE  Final   Gram Stain   Final    FEW WBC PRESENT, PREDOMINANTLY MONONUCLEAR NO ORGANISMS SEEN    Culture   Final    NO GROWTH < 24 HOURS Performed at [facility], [place] [address], [place], [place] [address]    Report Status PENDING  Incomplete    Radiology Reports CT ABDOMEN PELVIS W CONTRAST  Result Date: 10/22/2019 CLINICAL DATA:  Diffuse abdominal pain and anemia. EXAM: CT ABDOMEN AND PELVIS WITH CONTRAST TECHNIQUE: Multidetector CT imaging of the abdomen and pelvis was performed using the standard protocol following bolus administration of intravenous contrast. CONTRAST:  83m OMNIPAQUE IOHEXOL 300 MG/ML  SOLN COMPARISON:  CT scan 10/19/2019 FINDINGS: Lower chest: Persistent moderate to large bilateral pleural effusions with significant overlying atelectasis. The heart is normal in size. No pericardial effusion. Age advanced coronary artery calcifications are noted.  Hepatobiliary: No focal hepatic lesions or intrahepatic biliary dilatation. Ill-defined area of contrast enhancement and segment 7 likely a vascular shunt. The gallbladder is mildly contracted. No common bile duct dilatation. Pancreas: Scattered calcifications or likely due to prior inflammation. No worrisome pancreatic lesions or acute inflammatory process. No ductal dilatation. Spleen: Normal size. No focal lesions. Adrenals/Urinary Tract: The adrenal glands and kidneys are unremarkable. No worrisome renal lesions or hydronephrosis. The bladder appears normal. Stomach/Bowel: The stomach, duodenum, small bowel and colon are grossly normal without oral contrast. No acute inflammatory changes, mass lesions or obstructive findings. Vascular/Lymphatic: Severe/advanced atherosclerotic calcifications involving the aorta and branch vessels for age. No aneurysm or dissection. The major venous structures are patent. No mesenteric or retroperitoneal mass or adenopathy. Reproductive: The uterus and ovaries are unremarkable. Calcified fibroid again noted posteriorly. Other: Small to moderate amount of free pelvic fluid is again demonstrated. No obvious cause. Musculoskeletal: Stable compression fracture of L1. No worrisome bone lesions. Age advanced osteoporosis. IMPRESSION: 1. Persistent moderate to large bilateral pleural effusions with overlying atelectasis. 2. No acute abdominal/pelvic findings, mass lesions or adenopathy. 3. Severe/advanced atherosclerotic calcifications involving the aorta and branch vessels for age. 4. Small to moderate amount of persistent free pelvic fluid. No obvious cause. 5. Calcified uterine fibroids.  Aortic Atherosclerosis (ICD10-I70.0). Electronically Signed   By: Marijo Sanes M.D.   On: 10/22/2019 13:56   US RENAL  Result Date: 10/25/2019 CLINICAL DATA:  Acute renal failure EXAM: RENAL / URINARY TRACT ULTRASOUND COMPLETE COMPARISON:  None. FINDINGS: Right Kidney: Renal measurements: 13.5 x  5.7 x 4.7 cm. = volume: 188 mL . Echogenicity within normal limits. No mass or hydronephrosis visualized. Left Kidney: Renal measurements: 14.2 x 4.9 x 4.3 cm = volume: 157 mL. Echogenicity within normal limits. No mass or hydronephrosis visualized. Bladder: Decompressed by Foley catheter. Other: None. IMPRESSION: No acute abnormality noted. Electronically Signed   By: Inez Catalina M.D.   On: 10/25/2019 22:11   DG Chest Port 1 View  Result Date: 10/26/2019 CLINICAL DATA:  Shortness of breath, COVID positive EXAM: PORTABLE CHEST 1 VIEW COMPARISON:  10/25/2019 FINDINGS: Right-sided PICC line terminates in the distal superior vena cava. Heart size is stable and normal accounting for projection. Increasing opacity at the left lung base without silhouetting of left hemidiaphragm. Subtle opacities are seen on a background of mild interstitial prominence worse in the left lung base, also present in right mid chest and lower chest. Visualized skeletal structures are unremarkable. IMPRESSION: 1. Increasing opacity at the left lung base without silhouetting of the left hemidiaphragm. Findings may represent developing effusion. 2. Subtle opacities in the right mid chest and lower chest. Findings could be seen in the setting of COVID-19 pneumonia. Continued follow-up suggested. Electronically Signed   By: Zetta Bills M.D.   On: 10/26/2019 08:59   DG Chest Port 1 View  Result Date: 10/25/2019 CLINICAL DATA:  Status post thoracentesis. EXAM: PORTABLE CHEST 1 VIEW COMPARISON:  Radiograph earlier this day. Lung bases from abdominal CT 10/22/2019 FINDINGS: Decreased left pleural effusion. No visualized pneumothorax. Hazy opacity at the right lung base likely small right pleural effusion and atelectasis. Right upper extremity PICC remains in place. Borderline cardiomegaly with unchanged mediastinal contours. Improved pulmonary edema from earlier this day. Bones are under mineralized. IMPRESSION: 1. Decreased left pleural  effusion after thoracentesis. No visualized pneumothorax. 2. Hazy opacity at the right lung base likely small right pleural effusion and atelectasis. 3. Improving pulmonary edema. Electronically Signed   By: Keith Rake M.D.   On: 10/25/2019 17:17   DG Chest Port 1 View  Result Date: 10/25/2019 CLINICAL DATA:  Status post thoracentesis. COVID positive. EXAM: PORTABLE CHEST 1 VIEW COMPARISON:  One-view chest x-ray 10/23/19 FINDINGS: Heart size is normal. Right-sided PICC line terminates in the distal SVC. Left pleural effusion and airspace disease is improved. No pneumothorax is present. Mild edema is present. IMPRESSION: 1. Improving left pleural effusion and airspace disease. 2. No pneumothorax. 3. Mild edema. Electronically Signed   By: San Morelle M.D.   On: 10/25/2019 06:57   DG CHEST PORT 1 VIEW  Result Date: 10/23/2019 CLINICAL DATA:  Acute respiratory insufficiency EXAM: PORTABLE CHEST 1 VIEW COMPARISON:  October 22, 2019 FINDINGS: The heart size and mediastinal contours are unchanged with mild cardiomegaly. There is a small left pleural effusion. There is slight interval improvement in the hazy/interstitial markings throughout both lungs. No acute osseous abnormality. IMPRESSION: Slight interval improvement in the bilateral hazy airspace opacities which could be due to pulmonary edema or infectious etiology. Small left pleural effusion. Electronically Signed   By: Prudencio Pair M.D.   On: 10/23/2019 06:32   DG Chest Port 1 View  Result Date: 10/22/2019 CLINICAL DATA:  CHF respiratory distress.  COVID-19 positive EXAM: PORTABLE CHEST  1 VIEW COMPARISON:  10/20/2019 FINDINGS: Diffuse bilateral airspace disease with basilar predominance shows mild progression. Moderate left effusion has improved in the interval. Probable small right effusion. IMPRESSION: Mild progression of diffuse bilateral airspace disease which may represent edema or pneumonia. Improvement in left pleural effusion.  Electronically Signed   By: Franchot Gallo M.D.   On: 10/22/2019 09:28   ECHOCARDIOGRAM COMPLETE  Result Date: 10/23/2019    ECHOCARDIOGRAM REPORT   Patient Name:   Aspasia Myhand  Date of Exam: 10/23/2019 Medical Rec #:  [id number]  Height:       63.0 in Accession #:    [id number] Weight:       156.5 lb Date of Birth:  25-Sep-1960 BSA:          1.742 m Patient Age:    62 years   BP:           124/65 mmHg Patient Gender: F          HR:           66 bpm. Exam Location:  Inpatient Procedure: 2D Echo, Cardiac Doppler and Color Doppler Indications:    I50.33 Acute on chronic diastolic (congestive) heart failure  History:        Patient has no prior history of Echocardiogram examinations.                 COPD, TIA and PAD; Risk Factors:Hypertension and Diabetes.                 COVID-19 Positive.  Sonographer:    Jonelle Sidle Dance Referring Phys: An[place]wa  1. Left ventricular ejection fraction, by estimation, is 55 to 60%. The left ventricle has normal function. The left ventricle has no regional wall motion abnormalities. Left ventricular diastolic parameters are consistent with Grade II diastolic dysfunction (pseudonormalization). Elevated left atrial pressure.  2. Right ventricular systolic function is normal. The right ventricular size is normal. Tricuspid regurgitation signal is inadequate for assessing PA pressure.  3. Left atrial size was moderately dilated.  4. Large pleural effusion in the left lateral region.  5. The mitral valve is normal in structure and function. Mild mitral valve regurgitation.  6. The aortic valve is normal in structure and function. Aortic valve regurgitation is not visualized.  7. The inferior vena cava is dilated in size with >50% respiratory variability, suggesting right atrial pressure of 8 mmHg. Comparison(s): No prior Echocardiogram. FINDINGS  Left Ventricle: Left ventricular ejection fraction, by estimation, is 55 to 60%. The left ventricle has normal function. The  left ventricle has no regional wall motion abnormalities. The left ventricular internal cavity size was normal in size. There is  no left ventricular hypertrophy. Left ventricular diastolic parameters are consistent with Grade II diastolic dysfunction (pseudonormalization). Elevated left atrial pressure. Right Ventricle: The right ventricular size is normal. No increase in right ventricular wall thickness. Right ventricular systolic function is normal. Tricuspid regurgitation signal is inadequate for assessing PA pressure. Left Atrium: Left atrial size was moderately dilated. Right Atrium: Right atrial size was normal in size. Pericardium: There is no evidence of pericardial effusion. Mitral Valve: The mitral valve is normal in structure and function. Mild mitral annular calcification. Mild mitral valve regurgitation. Tricuspid Valve: The tricuspid valve is normal in structure. Tricuspid valve regurgitation is not demonstrated. Aortic Valve: The aortic valve is normal in structure and function. Aortic valve regurgitation is not visualized. Pulmonic Valve: The pulmonic valve was grossly normal. Pulmonic valve regurgitation  is not visualized. Aorta: The aortic root and ascending aorta are structurally normal, with no evidence of dilitation. Venous: The inferior vena cava is dilated in size with greater than 50% respiratory variability, suggesting right atrial pressure of 8 mmHg. IAS/Shunts: No atrial level shunt detected by color flow Doppler. Additional Comments: There is a large pleural effusion in the left lateral region.  LEFT VENTRICLE PLAX 2D LVIDd:         3.79 cm  Diastology LVIDs:         2.87 cm  LV e' lateral:   6.00 cm/s LV PW:         1.27 cm  LV E/e' lateral: 18.3 LV IVS:        0.94 cm  LV e' medial:    3.57 cm/s LVOT diam:     1.80 cm  LV E/e' medial:  30.8 LV SV:         52 LV SV Index:   30 LVOT Area:     2.54 cm  RIGHT VENTRICLE            IVC RV Basal diam:  3.24 cm    IVC diam: 2.47 cm RV Mid  diam:    1.67 cm RV S prime:     9.32 cm/s TAPSE (M-mode): 1.8 cm LEFT ATRIUM             Index       RIGHT ATRIUM           Index LA diam:        3.90 cm 2.24 cm/m  RA Area:     15.50 cm LA Vol (A2C):   79.2 ml 45.46 ml/m RA Volume:   42.40 ml  24.33 ml/m LA Vol (A4C):   61.2 ml 35.12 ml/m LA Biplane Vol: 73.7 ml 42.30 ml/m  AORTIC VALVE LVOT Vmax:   89.80 cm/s LVOT Vmean:  62.000 cm/s LVOT VTI:    0.204 m  AORTA Ao Root diam: 2.80 cm Ao Asc diam:  2.40 cm MITRAL VALVE MV Area (PHT): 3.72 cm     SHUNTS MV Decel Time: 204 msec     Systemic VTI:  0.20 m MV E velocity: 110.00 cm/s  Systemic Diam: 1.80 cm MV A velocity: 54.30 cm/s MV E/A ratio:  2.03 Mihai Croitoru MD Electronically signed by Sanda Klein MD Signature Date/Time: 10/23/2019/3:50:43 PM    Final    [place] EKG SITE RITE  Result Date: 10/22/2019 If Site Rite image not attached, placement could not be confirmed due to current cardiac rhythm.  US Abdomen Limited RUQ  Result Date: 10/22/2019 CLINICAL DATA:  Acute right upper quadrant abdominal pain. EXAM: ULTRASOUND ABDOMEN LIMITED RIGHT UPPER QUADRANT COMPARISON:  October 19, 2019. FINDINGS: Gallbladder: No gallstones or wall thickening visualized. No sonographic Murphy sign noted by sonographer. Common bile duct: Diameter: 5 mm which is within normal limits. Liver: No focal lesion identified. Within normal limits in parenchymal echogenicity. Portal vein is patent on color Doppler imaging with normal direction of blood flow towards the liver. Other: Minimal ascites is noted. IMPRESSION: Minimal ascites. No other abnormality seen in the right upper quadrant of the abdomen. Electronically Signed   By: Marijo Conception M.D.   On: 10/22/2019 09:03

## 2019-10-27 NOTE — Progress Notes (Signed)
MRI was called back and ask about the time when patient can have the test done. At this time they are not sure exactly when the patient can have the test due to a big number of test for neuro patients.

## 2019-10-27 NOTE — Progress Notes (Signed)
Patient anxious because she needed to be NPO for abdominal MRI. MRI was called twice and asked when they will be available to take the patient. They still have patients from ED and after, patient will be able to go for MRI. Patient verbalized that she doesn't have anybody from her family that must be called with updates. No contact number documented on contact information. Will continue to monitor.

## 2019-10-28 ENCOUNTER — Inpatient Hospital Stay (HOSPITAL_COMMUNITY): Payer: Medicaid Other

## 2019-10-28 LAB — CBC WITH DIFFERENTIAL/PLATELET
Abs Immature Granulocytes: 0.21 10*3/uL — ABNORMAL HIGH (ref 0.00–0.07)
Basophils Absolute: 0 10*3/uL (ref 0.0–0.1)
Basophils Relative: 0 %
Eosinophils Absolute: 0.1 10*3/uL (ref 0.0–0.5)
Eosinophils Relative: 2 %
HCT: 28.9 % — ABNORMAL LOW (ref 36.0–46.0)
Hemoglobin: 9.1 g/dL — ABNORMAL LOW (ref 12.0–15.0)
Immature Granulocytes: 4 %
Lymphocytes Relative: 13 %
Lymphs Abs: 0.7 10*3/uL (ref 0.7–4.0)
MCH: 25.1 pg — ABNORMAL LOW (ref 26.0–34.0)
MCHC: 31.5 g/dL (ref 30.0–36.0)
MCV: 79.8 fL — ABNORMAL LOW (ref 80.0–100.0)
Monocytes Absolute: 0.5 10*3/uL (ref 0.1–1.0)
Monocytes Relative: 9 %
Neutro Abs: 4 10*3/uL (ref 1.7–7.7)
Neutrophils Relative %: 72 %
Platelets: 199 10*3/uL (ref 150–400)
RBC: 3.62 MIL/uL — ABNORMAL LOW (ref 3.87–5.11)
RDW: 13.6 % (ref 11.5–15.5)
WBC: 5.5 10*3/uL (ref 4.0–10.5)
nRBC: 0 % (ref 0.0–0.2)

## 2019-10-28 LAB — COMPREHENSIVE METABOLIC PANEL
ALT: 17 U/L (ref 0–44)
AST: 18 U/L (ref 15–41)
Albumin: 2.2 g/dL — ABNORMAL LOW (ref 3.5–5.0)
Alkaline Phosphatase: 52 U/L (ref 38–126)
Anion gap: 8 (ref 5–15)
BUN: 40 mg/dL — ABNORMAL HIGH (ref 6–20)
CO2: 26 mmol/L (ref 22–32)
Calcium: 8.5 mg/dL — ABNORMAL LOW (ref 8.9–10.3)
Chloride: 102 mmol/L (ref 98–111)
Creatinine, Ser: 1.56 mg/dL — ABNORMAL HIGH (ref 0.44–1.00)
GFR calc Af Amer: 42 mL/min — ABNORMAL LOW (ref >60)
GFR calc non Af Amer: 36 mL/min — ABNORMAL LOW (ref >60)
Glucose, Bld: 201 mg/dL — ABNORMAL HIGH (ref 70–99)
Potassium: 3.8 mmol/L (ref 3.5–5.1)
Sodium: 136 mmol/L (ref 135–145)
Total Bilirubin: 0.4 mg/dL (ref 0.3–1.2)
Total Protein: 5.1 g/dL — ABNORMAL LOW (ref 6.5–8.1)

## 2019-10-28 LAB — BODY FLUID CULTURE: Culture: NO GROWTH

## 2019-10-28 LAB — GLUCOSE, CAPILLARY
Glucose-Capillary: 174 mg/dL — ABNORMAL HIGH (ref 70–99)
Glucose-Capillary: 281 mg/dL — ABNORMAL HIGH (ref 70–99)
Glucose-Capillary: 220 mg/dL — ABNORMAL HIGH (ref 70–99)
Glucose-Capillary: 181 mg/dL — ABNORMAL HIGH (ref 70–99)

## 2019-10-28 LAB — C-REACTIVE PROTEIN: CRP: 0.8 mg/dL (ref <1.0)

## 2019-10-28 LAB — MAGNESIUM: Magnesium: 1.5 mg/dL — ABNORMAL LOW (ref 1.7–2.4)

## 2019-10-28 LAB — BRAIN NATRIURETIC PEPTIDE: B Natriuretic Peptide: 770.4 pg/mL — ABNORMAL HIGH (ref 0.0–100.0)

## 2019-10-28 LAB — D-DIMER, QUANTITATIVE: D-Dimer, Quant: 3.47 ug/mL-FEU — ABNORMAL HIGH (ref 0.00–0.50)

## 2019-10-28 LAB — PROCALCITONIN: Procalcitonin: 0.22 ng/mL

## 2019-10-28 MED ORDER — FUROSEMIDE 40 MG PO TABS: 60.0000 mg | ORAL_TABLET | Freq: Two times a day (BID) | ORAL | Status: DC

## 2019-10-28 MED ORDER — FUROSEMIDE 20 MG PO TABS
60.0000 mg | ORAL_TABLET | Freq: Two times a day (BID) | ORAL | Status: DC
  Administered 2019-10-28 (×2): 60 mg via ORAL
  Filled 2019-10-28 (×2): qty 3

## 2019-10-28 MED ORDER — MAGNESIUM SULFATE 2 GM/50ML IV SOLN
2.0000 g | Freq: Once | INTRAVENOUS | Status: AC
  Administered 2019-10-28: 2 g via INTRAVENOUS
  Filled 2019-10-28: qty 50

## 2019-10-28 MED ORDER — ENOXAPARIN SODIUM 40 MG/0.4ML ~~LOC~~ SOLN
40.0000 mg | SUBCUTANEOUS | Status: DC
  Administered 2019-10-29 – 2019-10-30 (×2): 40 mg via SUBCUTANEOUS
  Filled 2019-10-28 (×3): qty 0.4

## 2019-10-28 MED ORDER — SPIRONOLACTONE 25 MG PO TABS
25.0000 mg | ORAL_TABLET | Freq: Every day | ORAL | Status: DC
  Administered 2019-10-28 – 2019-11-04 (×7): 25 mg via ORAL
  Filled 2019-10-28 (×7): qty 1

## 2019-10-28 NOTE — Progress Notes (Signed)
Patient verbalized that she doesn't want staff to call any family member. No contact number on contact information list.

## 2019-10-28 NOTE — Progress Notes (Signed)
Patient back from the MRI . Given food and schedule medicine. Patient denies for pain and discomfort.

## 2019-10-28 NOTE — Progress Notes (Signed)
PROGRESS NOTE                                                                                                                                                                                                             Patient Demographics:    Jamie [name], is a 59 y.o. 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    She was recently hospitalized twice to [facility] for shortness of breath, her work-up showed bilateral transudative pleural effusion, she had thoracentesis suggesting transudative fluid with no malignant cells.  Reason for recurrent effusion was thought to be acute on chronic diastolic CHF.  She was also found to have liver mass.  She was finally transferred to [facility] for cardiology evaluation for recurrent CHF.   Subjective:   Patient in bed, appears comfortable, denies any headache, no fever, no chest pain or pressure, no shortness of breath , no abdominal pain. No focal weakness.   Assessment  & Plan :    1.  Severe Acute hypoxic respiratory failure due to recurrent Acute on chronic diastolic CHF EF 87% .  Recurrent bilateral pleural effusions which are transudate if worked up in [place], EF 60%.    He had massive pulmonary edema with pleural effusion at the time of presentation and almost got intubated, was aggressively diuresed and monitored in ICU for a day, now breathing much improved.  She also underwent  ultrasound-guided thoracentesis on 3/3 and 3/4 respectively with 750 cc of transudative fluid removed from right side and 1300 cc of fluid removed from the left side.  Again this appears transudative with mesothelial cells and cytology.  Patient has had 2 other bouts of thoracentesis at [facility] earlier this month.  Due to AKI her diuretics had to be stopped for a few days they were  resumed on 10/26/2019, dose has been increased on 10/28/2019.  Plan is if pleural effusion continues to get worse then she will get Pleurx catheter have discussed this with pulmonary.  She was also seen by pulmonary and cardiology this admission.  Currently hypoxia is much improved.   2. Acute Covid 19 Viral Pneumonitis during the ongoing 2020 Covid 19 Pandemic - her main disease process and hypoxia is due to #1 above which is acute on chronic diastolic CHF.  Her Covid disease itself seems to be mild to moderate for which he has completed her treatment with remdesivir and steroids.  Encouraged the patient to sit up in chair in the daytime use I-S and flutter valve for pulmonary toiletry and then prone in bed when at night.  SpO2: 100 % O2 Flow Rate (L/min): 2 L/min FiO2 (%): 40 %  Recent Labs  Lab 10/21/19 1839 10/21/19 1957 10/22/19 1019 10/23/19 0513 10/24/19 0354 10/25/19 0520 10/25/19 0521 10/26/19 0313 10/27/19 0418 10/28/19 0330  CRP  --   --  6.0*   < > 3.3* 1.5*  --  0.8 0.6 0.8  DDIMER  --   --  3.75*   < > 3.41* 3.24*  --  3.02* 3.02* 3.47*  FERRITIN  --   --  177  --   --   --   --   --   --   --   BNP  --    < >  --    < > 1,140.9*  --  945.6* 864.8* 822.5* 770.4*  PROCALCITON  --    < > 0.22   < > 0.32 0.37  --  0.32 0.31 0.22  SARSCOV2NAA POSITIVE*  --   --   --   --   --   --   --   --   --    < > = values in this interval not displayed.    Hepatic Function Latest Ref Rng & Units 10/28/2019 10/27/2019 10/26/2019  Total Protein 6.5 - 8.1 g/dL 5.1(L) 4.9(L) 5.2(L)  Albumin 3.5 - 5.0 g/dL  2.2(L) 2.2(L) 2.3(L)  AST 15 - 41 U/L 18 17 15   ALT 0 - 44 U/L 17 17 16   Alk Phosphatase 38 - 126 U/L 52 55 52  Total Bilirubin 0.3 - 1.2 mg/dL 0.4 0.4 0.4      3.  PAD.  S/p right BKA, CT evidence of abdominal diffuse calcification with recurrent abdominal pain.  Highly questionable for ischemic bowel, for now continue aspirin and statin for secondary prevention, clear liquid diet and monitor.  She likely has diffuse atherosclerosis due to uncontrolled diabetes mellitus and hypertension.  She denies any history of smoking, with supportive care she is somewhat better and tolerating liquid diet.  4.  Hypertensive crisis. Stopped  IV Clevidipine drip, oral medications adjusted and as needed IV hydralazine added.  Currently on combination of beta-blocker, Norvasc, clonidine, hydralazine along with Imdur.   5.  GERD with nausea.  Also has HX of diabetic gastroparesis.  For now supportive care with clear liquid diet, PPI and nausea medications.   6.  Dyslipidemia.  Continue statin.  7.  Anemia of chronic disease.  Monitor.  8.  Hypertensive urgency.  Resolved.  9.  Depression and anxiety.  Continue combination of bupropion, fluoxetine, buspirone and Xanax.  10.  AKI at [facility] with creatinine of 1.5.  Renal function worse likely due to combination of IV contrast from CT and diuresis, Lasix was held and she  was gently hydrated for 2 days from 10/25/2019 to 10/26/2019 with total of 2 L of normal saline, creatinine has stabilized and now trending down, will commence low-dose Lasix on 10/27/2019 as she is developing crackles and evidence of reoccurrence of pleural effusion.  11. Liver mass suspicious on CT scan.  No mass noted on MRI.  12.  Hypomagnesemia.  Replaced.   13.  DM2 - placed on Lantus sliding scale along with Premeal NovoLog.  Monitor and adjust  Lab Results  Component Value Date   HGBA1C 6.9 (H) 10/23/2019     CBG (last 3)  Recent Labs    10/27/19 1738 10/27/19 1938  10/28/19 0745  GLUCAP 191* 142* 174*     Condition - Extremely Guarded  Family Communication  :  Daughter in law RN on 10/22/19 @ [phone], called on 10/23/2019 at 10:20 AM - full mailbox.  Code Status :  Full  Diet :   Diet Order            Diet Heart Room service appropriate? Yes; Fluid consistency: Thin; Fluid restriction: 1200 mL Fluid  Diet effective now               Disposition Plan  : In the hospital, still getting high-dose IV diuretics for CHF.  Pleural effusion recurrent, if reoccurs and continues to get worse will require bilateral Pleurx catheter placed this admission  Consults  : Pulmonary, cardiology  Procedures  :    MRI abdomen.  No suspicious liver lesion.    Ultrasound-guided right-sided thoracentesis on 10/24/2019 with 750 cc of fluid removed.  Done by pulmonary.  Ultrasound guided left-sided thoracentesis on 10/25/2019 with 1300 cc of fluid removed.  Done by pulmonary  Renal ultrasound.  CT - 1. Persistent moderate to large bilateral pleural effusions with overlying atelectasis. 2. No acute abdominal/pelvic findings, mass lesions or adenopathy. 3. Severe/advanced atherosclerotic calcifications involving the aorta and branch vessels for age. 4. Small to moderate amount of persistent free pelvic fluid. No obvious cause. 5. Calcified uterine fibroids. Aortic Atherosclerosis.  TTE from [facility] - EF 60%, dCHF  PICC line and Foley  requested.  PUD Prophylaxis : PPI  DVT Prophylaxis  :  Lovenox    Lab Results  Component Value Date   PLT 199 10/28/2019    Inpatient Medications  Scheduled Meds:  amLODipine  10 mg Oral Daily   aspirin EC  81 mg Oral Daily   atorvastatin  80 mg Oral QHS   busPIRone  5 mg Oral Daily   Chlorhexidine Gluconate Cloth  6 each Topical Daily   clonazepam  0.25 mg Oral BID   cloNIDine  0.2 mg Oral BID   enoxaparin (LOVENOX) injection  30 mg Subcutaneous Q24H   FLUoxetine  40 mg Oral Daily   folic acid  1 mg Oral  Daily   furosemide  60 mg Oral BID   hydrALAZINE  100 mg Oral Q8H   insulin aspart  0-15 Units Subcutaneous TID WC   insulin aspart  0-5 Units Subcutaneous QHS   insulin glargine  25 Units Subcutaneous QHS   isosorbide mononitrate  60 mg Oral Daily   labetalol  300 mg Oral BID   multivitamin with minerals  1 tablet Oral Daily   pantoprazole  40 mg Oral Daily   spironolactone  25 mg Oral Daily   thiamine  100 mg Oral Daily   Continuous Infusions:  PRN Meds:.acetaminophen, albuterol, ALPRAZolam, hydrALAZINE, HYDROcodone-acetaminophen, ipratropium-albuterol, morphine injection, nitroGLYCERIN, ondansetron (ZOFRAN) IV,  sodium chloride flush  Antibiotics  :    Anti-infectives (From admission, onward)   Start     Dose/Rate Route Frequency Ordered Stop   10/23/19 1000  remdesivir 100 mg in sodium chloride 0.9 % 100 mL IVPB     100 mg 200 mL/hr over 30 Minutes Intravenous Daily 10/22/19 1713 10/26/19 1006   10/22/19 1800  remdesivir 200 mg in sodium chloride 0.9% 250 mL IVPB     200 mg 580 mL/hr over 30 Minutes Intravenous Once 10/22/19 1713 10/22/19 1843   10/21/19 2100  cefTRIAXone (ROCEPHIN) 1 g in sodium chloride 0.9 % 100 mL IVPB  Status:  Discontinued     1 g 200 mL/hr over 30 Minutes Intravenous Every 24 hours 10/21/19 1744 10/22/19 0748   10/21/19 2100  azithromycin (ZITHROMAX) tablet 500 mg  Status:  Discontinued     500 mg Oral Daily 10/21/19 1744 10/22/19 0748       Time Spent in minutes  30   Lala Lund M.D on 10/28/2019 at 9:23 AM  To page go to [email or web address] - password [facility]  [facility] -  Office  [phone]     See all Orders from today for further details    Objective:   Vitals:   10/28/19 0324 10/28/19 0423 10/28/19 0512 10/28/19 0800  BP:  (!) 145/63  (!) 146/57  Pulse:    76  Resp:    17  Temp: 99 F (37.2 C)   98.4 F (36.9 C)  TempSrc: Oral   Oral  SpO2:    100%  Weight: 68.6 kg  68.5 kg   Height:        Wt Readings  from Last 3 Encounters:  10/28/19 68.5 kg     Intake/Output Summary (Last 24 hours) at 10/28/2019 0923 Last data filed at 10/28/2019 0919 Gross per 24 hour  Intake 380 ml  Output 2600 ml  Net -2220 ml     Physical Exam  Awake Alert, No new F.N deficits, Normal affect [place].AT,PERRAL Supple Neck,No JVD, No cervical lymphadenopathy appriciated.  Symmetrical Chest wall movement, reduced bibasilar breath sounds RRR,No Gallops, Rubs or new Murmurs, No Parasternal Heave +ve B.Sounds, Abd Soft, No tenderness, No organomegaly appriciated, No rebound - guarding or rigidity. No Cyanosis,   R. BKA     Data Review:    CBC Recent Labs  Lab 10/24/19 0354 10/25/19 0520 10/26/19 0313 10/27/19 0418 10/28/19 0330  WBC 3.5* 4.3 5.6 5.6 5.5  HGB 8.8* 8.2* 8.0* 8.5* 9.1*  HCT 27.6* 26.3* 26.4* 27.1* 28.9*  PLT 173 207 201 198 199  MCV 80.0 82.2 81.2 80.7 79.8*  MCH 25.5* 25.6* 24.6* 25.3* 25.1*  MCHC 31.9 31.2 30.3 31.4 31.5  RDW 13.9 13.8 13.6 13.6 13.6  LYMPHSABS 0.2* 0.3* 0.3* 0.7 0.7  MONOABS 0.2 0.2 0.2 0.5 0.5  EOSABS 0.0 0.0 0.0 0.1 0.1  BASOSABS 0.0 0.0 0.0 0.0 0.0    Chemistries  Recent Labs  Lab 10/24/19 0354 10/25/19 0520 10/26/19 0313 10/27/19 0418 10/28/19 0330  NA 133* 133* 133* 136 136  K 4.1 4.7 4.8 4.3 3.8  CL 97* 98 98 101 102  CO2 23 24 24 27 26   GLUCOSE 281* 381* 296* 194* 201*  BUN 51* 62* 66* 58* 40*  CREATININE 2.41* 3.30* 2.91* 2.35* 1.56*  CALCIUM 8.1* 8.0* 8.3* 8.5* 8.5*  MG 1.8 1.9 1.9 1.8 1.5*  AST 17 17 15 17 18   ALT 17 17 16 17 17   ALKPHOS 54  54 52 55 52  BILITOT 0.4 0.4 0.4 0.4 0.4   ------------------------------------------------------------------------------------------------------------------ No results for input(s): CHOL, HDL, LDLCALC, TRIG, CHOLHDL, LDLDIRECT in the last 72 hours.  Lab Results  Component Value Date   HGBA1C 6.9 (H) 10/23/2019    ------------------------------------------------------------------------------------------------------------------ No results for input(s): TSH, T4TOTAL, T3FREE, THYROIDAB in the last 72 hours.  Invalid input(s): FREET3  Cardiac Enzymes No results for input(s): CKMB, TROPONINI, MYOGLOBIN in the last 168 hours.  Invalid input(s): CK ------------------------------------------------------------------------------------------------------------------    Component Value Date/Time   BNP 770.4 (H) 10/28/2019 0330    Micro Results Recent Results (from the past 240 hour(s))  SARS CORONAVIRUS 2 (TAT 6-24 HRS) Nasopharyngeal Nasopharyngeal Swab     Status: Abnormal   Collection Time: 10/21/19  6:39 PM   Specimen: Nasopharyngeal Swab  Result Value Ref Range Status   SARS Coronavirus 2 POSITIVE (A) NEGATIVE Final    Comment: RESULT CALLED TO, READ BACK BY AND VERIFIED WITH: K. AJRDY,RN 0017 10/22/2019 T. TYSOR (NOTE) SARS-CoV-2 target nucleic acids are DETECTED. The SARS-CoV-2 RNA is generally detectable in upper and lower respiratory specimens during the acute phase of infection. Positive results are indicative of the presence of SARS-CoV-2 RNA. Clinical correlation with patient history and other diagnostic information is  necessary to determine patient infection status. Positive results do not rule out bacterial infection or co-infection with other viruses.  The expected result is Negative. Fact Sheet for Patients: SugarRoll.be Fact Sheet for Healthcare Providers: https://www.woods-mathews.com/ This test is not yet approved or cleared by the [place] FDA and  has been authorized for detection and/or diagnosis of SARS-CoV-2 by FDA under an Emergency Use Authorization (EUA). This EUA will remain  in effect (meaning this test can be used) for th e duration of the COVID-19 declaration under Section 564(b)(1) of the Act, 21 U.S.C. section  360bbb-3(b)(1), unless the authorization is terminated or revoked sooner. Performed at [facility], [place] [address] [place], [place] [address]   MRSA PCR Screening     Status: None   Collection Time: 10/22/19  2:51 PM   Specimen: Nasal Mucosa; Nasopharyngeal  Result Value Ref Range Status   MRSA by PCR NEGATIVE NEGATIVE Final    Comment:        The GeneXpert MRSA Assay (FDA approved for NASAL specimens only), is one component of a comprehensive MRSA colonization surveillance program. It is not intended to diagnose MRSA infection nor to guide or monitor treatment for MRSA infections. Performed at [facility], [place] [address], [place], [place] [address]   Stat Gram stain     Status: None   Collection Time: 10/24/19 11:14 AM   Specimen: Pleura; Body Fluid  Result Value Ref Range Status   Specimen Description PLEURAL RIGHT  Final   Special Requests NONE  Final   Gram Stain   Final    RARE WBC PRESENT, PREDOMINANTLY MONONUCLEAR NO ORGANISMS SEEN Performed at [facility], [address], [place],  [address]    Report Status 10/24/2019 FINAL  Final  Respiratory Panel by PCR     Status: None   Collection Time: 10/25/19  4:18 AM   Specimen: Nasopharyngeal Swab; Respiratory  Result Value Ref Range Status   Adenovirus NOT DETECTED NOT DETECTED Final   Coronavirus 229E NOT DETECTED NOT DETECTED Final    Comment: (NOTE) The Coronavirus on the Respiratory Panel, DOES NOT test for the novel  Coronavirus (2019 nCoV)    Coronavirus HKU1 NOT DETECTED NOT  DETECTED Final   Coronavirus NL63 NOT DETECTED NOT DETECTED Final   Coronavirus OC43 NOT DETECTED NOT DETECTED Final   Metapneumovirus NOT DETECTED NOT DETECTED Final   Rhinovirus / Enterovirus NOT DETECTED NOT DETECTED Final   Influenza A NOT DETECTED NOT DETECTED Final   Influenza B NOT DETECTED NOT DETECTED Final   Parainfluenza Virus 1 NOT DETECTED NOT DETECTED Final   Parainfluenza Virus 2 NOT  DETECTED NOT DETECTED Final   Parainfluenza Virus 3 NOT DETECTED NOT DETECTED Final   Parainfluenza Virus 4 NOT DETECTED NOT DETECTED Final   Respiratory Syncytial Virus NOT DETECTED NOT DETECTED Final   Bordetella pertussis NOT DETECTED NOT DETECTED Final   Chlamydophila pneumoniae NOT DETECTED NOT DETECTED Final   Mycoplasma pneumoniae NOT DETECTED NOT DETECTED Final    Comment: Performed at [facility], [place] [address], [place], [place] [address]  Body fluid culture (includes gram stain)     Status: None   Collection Time: 10/25/19  4:29 PM   Specimen: Pleural Fluid  Result Value Ref Range Status   Specimen Description FLUID PLEURAL  Final   Special Requests NONE  Final   Gram Stain   Final    FEW WBC PRESENT, PREDOMINANTLY MONONUCLEAR NO ORGANISMS SEEN    Culture   Final    NO GROWTH 3 DAYS Performed at [facility], [place] [address], [place], [place] [address]    Report Status 10/28/2019 FINAL  Final    Radiology Reports MR ABDOMEN WO CONTRAST  Result Date: 10/28/2019 CLINICAL DATA:  59 year old female with history of liver lesion. Follow-up study. EXAM: MRI ABDOMEN WITHOUT CONTRAST TECHNIQUE: Multiplanar multisequence MR imaging was performed without the administration of intravenous contrast. COMPARISON:  No prior abdominal MRI. CT the abdomen and pelvis 10/22/2019. FINDINGS: Comment: Today's study is limited by lack of IV gadolinium for detection and characterization of visceral and/or vascular lesions. Lower chest: Large right and moderate left pleural effusions. Signal in the visualize lung bases dependently, nonspecific, but favored to reflect areas of passive atelectasis. Hepatobiliary: No definite suspicious cystic or solid hepatic lesions are confidently identified on today's noncontrast examination. No intra or extrahepatic biliary ductal dilatation. Gallbladder is normal in appearance. Pancreas: No definite pancreatic mass or peripancreatic fluid collections or  inflammatory changes noted on today's noncontrast CT examination. Spleen:  Unremarkable. Adrenals/Urinary Tract: Unenhanced appearance of the kidneys and bilateral adrenal glands are normal. Stomach/Bowel: Visualized portions are unremarkable. Vascular/Lymphatic: No aneurysm identified in the visualized abdominal vasculature. No lymphadenopathy noted in the abdomen. Other: No significant volume of ascites noted in the visualized portions of the peritoneal cavity. Musculoskeletal: No aggressive appearing osseous lesions are noted in the visualized portions of the skeleton. Mild diffuse body wall edema. IMPRESSION: 1. The lesion of concern on the prior CT scan is not demonstrated on today's noncontrast MRI examination. This may suggest a benign perfusion anomaly on the prior CT scan. Regardless, this lesion is unlikely to be related to the patient's history of abdominal pain. If there is persistent clinical concern, this lesion could be further evaluated with repeat abdominal MRI with and without IV gadolinium. 2. Large right and moderate left pleural effusions with areas of probable passive atelectasis in the dependent portions of the lung bases bilaterally. 3. Mild diffuse body wall edema. Electronically Signed   By: Vinnie Langton M.D.   On: 10/28/2019 08:17   CT ABDOMEN PELVIS W CONTRAST  Result Date: 10/22/2019 CLINICAL DATA:  Diffuse abdominal pain and anemia. EXAM:  CT ABDOMEN AND PELVIS WITH CONTRAST TECHNIQUE: Multidetector CT imaging of the abdomen and pelvis was performed using the standard protocol following bolus administration of intravenous contrast. CONTRAST:  63m OMNIPAQUE IOHEXOL 300 MG/ML  SOLN COMPARISON:  CT scan 10/19/2019 FINDINGS: Lower chest: Persistent moderate to large bilateral pleural effusions with significant overlying atelectasis. The heart is normal in size. No pericardial effusion. Age advanced coronary artery calcifications are noted. Hepatobiliary: No focal hepatic lesions or  intrahepatic biliary dilatation. Ill-defined area of contrast enhancement and segment 7 likely a vascular shunt. The gallbladder is mildly contracted. No common bile duct dilatation. Pancreas: Scattered calcifications or likely due to prior inflammation. No worrisome pancreatic lesions or acute inflammatory process. No ductal dilatation. Spleen: Normal size. No focal lesions. Adrenals/Urinary Tract: The adrenal glands and kidneys are unremarkable. No worrisome renal lesions or hydronephrosis. The bladder appears normal. Stomach/Bowel: The stomach, duodenum, small bowel and colon are grossly normal without oral contrast. No acute inflammatory changes, mass lesions or obstructive findings. Vascular/Lymphatic: Severe/advanced atherosclerotic calcifications involving the aorta and branch vessels for age. No aneurysm or dissection. The major venous structures are patent. No mesenteric or retroperitoneal mass or adenopathy. Reproductive: The uterus and ovaries are unremarkable. Calcified fibroid again noted posteriorly. Other: Small to moderate amount of free pelvic fluid is again demonstrated. No obvious cause. Musculoskeletal: Stable compression fracture of L1. No worrisome bone lesions. Age advanced osteoporosis. IMPRESSION: 1. Persistent moderate to large bilateral pleural effusions with overlying atelectasis. 2. No acute abdominal/pelvic findings, mass lesions or adenopathy. 3. Severe/advanced atherosclerotic calcifications involving the aorta and branch vessels for age. 4. Small to moderate amount of persistent free pelvic fluid. No obvious cause. 5. Calcified uterine fibroids. Aortic Atherosclerosis (ICD10-I70.0). Electronically Signed   By: PMarijo SanesM.D.   On: 10/22/2019 13:56   [place]NAL  Result Date: 10/25/2019 CLINICAL DATA:  Acute renal failure EXAM: RENAL / URINARY TRACT ULTRASOUND COMPLETE COMPARISON:  None. FINDINGS: Right Kidney: Renal measurements: 13.5 x 5.7 x 4.7 cm. = volume: 188 mL .  Echogenicity within normal limits. No mass or hydronephrosis visualized. Left Kidney: Renal measurements: 14.2 x 4.9 x 4.3 cm = volume: 157 mL. Echogenicity within normal limits. No mass or hydronephrosis visualized. Bladder: Decompressed by Foley catheter. Other: None. IMPRESSION: No acute abnormality noted. Electronically Signed   By: MInez CatalinaM.D.   On: 10/25/2019 22:11   DG Chest Port 1 View  Result Date: 10/28/2019 CLINICAL DATA:  Shortness of breath, COVID positive EXAM: PORTABLE CHEST 1 VIEW COMPARISON:  10/26/2019 FINDINGS: Increased density at the lung bases. No pneumothorax. Stable cardiomediastinal contours. Right PICC line is again noted. IMPRESSION: Increased density at the lung bases probably reflecting a combination of pleural effusion and atelectasis. Electronically Signed   By: PMacy MisM.D.   On: 10/28/2019 07:50   DG Chest Port 1 View  Result Date: 10/26/2019 CLINICAL DATA:  Shortness of breath, COVID positive EXAM: PORTABLE CHEST 1 VIEW COMPARISON:  10/25/2019 FINDINGS: Right-sided PICC line terminates in the distal superior vena cava. Heart size is stable and normal accounting for projection. Increasing opacity at the left lung base without silhouetting of left hemidiaphragm. Subtle opacities are seen on a background of mild interstitial prominence worse in the left lung base, also present in right mid chest and lower chest. Visualized skeletal structures are unremarkable. IMPRESSION: 1. Increasing opacity at the left lung base without silhouetting of the left hemidiaphragm. Findings may represent developing effusion. 2. Subtle opacities in the right mid chest and  lower chest. Findings could be seen in the setting of COVID-19 pneumonia. Continued follow-up suggested. Electronically Signed   By: Zetta Bills M.D.   On: 10/26/2019 08:59   DG Chest Port 1 View  Result Date: 10/25/2019 CLINICAL DATA:  Status post thoracentesis. EXAM: PORTABLE CHEST 1 VIEW COMPARISON:  Radiograph  earlier this day. Lung bases from abdominal CT 10/22/2019 FINDINGS: Decreased left pleural effusion. No visualized pneumothorax. Hazy opacity at the right lung base likely small right pleural effusion and atelectasis. Right upper extremity PICC remains in place. Borderline cardiomegaly with unchanged mediastinal contours. Improved pulmonary edema from earlier this day. Bones are under mineralized. IMPRESSION: 1. Decreased left pleural effusion after thoracentesis. No visualized pneumothorax. 2. Hazy opacity at the right lung base likely small right pleural effusion and atelectasis. 3. Improving pulmonary edema. Electronically Signed   By: Keith Rake M.D.   On: 10/25/2019 17:17   DG Chest Port 1 View  Result Date: 10/25/2019 CLINICAL DATA:  Status post thoracentesis. COVID positive. EXAM: PORTABLE CHEST 1 VIEW COMPARISON:  One-view chest x-ray 10/23/19 FINDINGS: Heart size is normal. Right-sided PICC line terminates in the distal SVC. Left pleural effusion and airspace disease is improved. No pneumothorax is present. Mild edema is present. IMPRESSION: 1. Improving left pleural effusion and airspace disease. 2. No pneumothorax. 3. Mild edema. Electronically Signed   By: San Morelle M.D.   On: 10/25/2019 06:57   DG CHEST PORT 1 VIEW  Result Date: 10/23/2019 CLINICAL DATA:  Acute respiratory insufficiency EXAM: PORTABLE CHEST 1 VIEW COMPARISON:  October 22, 2019 FINDINGS: The heart size and mediastinal contours are unchanged with mild cardiomegaly. There is a small left pleural effusion. There is slight interval improvement in the hazy/interstitial markings throughout both lungs. No acute osseous abnormality. IMPRESSION: Slight interval improvement in the bilateral hazy airspace opacities which could be due to pulmonary edema or infectious etiology. Small left pleural effusion. Electronically Signed   By: Prudencio Pair M.D.   On: 10/23/2019 06:32   DG Chest Port 1 View  Result Date: 10/22/2019 CLINICAL  DATA:  CHF respiratory distress.  COVID-19 positive EXAM: PORTABLE CHEST 1 VIEW COMPARISON:  10/20/2019 FINDINGS: Diffuse bilateral airspace disease with basilar predominance shows mild progression. Moderate left effusion has improved in the interval. Probable small right effusion. IMPRESSION: Mild progression of diffuse bilateral airspace disease which may represent edema or pneumonia. Improvement in left pleural effusion. Electronically Signed   By: Franchot Gallo M.D.   On: 10/22/2019 09:28   ECHOCARDIOGRAM COMPLETE  Result Date: 10/23/2019    ECHOCARDIOGRAM REPORT   Patient Name:   Novalyn Aye  Date of Exam: 10/23/2019 Medical Rec #:  [id number]  Height:       63.0 in Accession #:    [id number] Weight:       156.5 lb Date of Birth:  [birth year] BSA:          1.742 m Patient Age:    74 years   BP:           124/65 mmHg Patient Gender: F          HR:           66 bpm. Exam Location:  Inpatient Procedure: 2D Echo, Cardiac Doppler and Color Doppler Indications:    I50.33 Acute on chronic diastolic (congestive) heart failure  History:        Patient has no prior history of Echocardiogram examinations.  COPD, TIA and PAD; Risk Factors:Hypertension and Diabetes.                 COVID-19 Positive.  Sonographer:    Jonelle Sidle Dance Referring Phys: [place]  1. Left ventricular ejection fraction, by estimation, is 55 to 60%. The left ventricle has normal function. The left ventricle has no regional wall motion abnormalities. Left ventricular diastolic parameters are consistent with Grade II diastolic dysfunction (pseudonormalization). Elevated left atrial pressure.  2. Right ventricular systolic function is normal. The right ventricular size is normal. Tricuspid regurgitation signal is inadequate for assessing PA pressure.  3. Left atrial size was moderately dilated.  4. Large pleural effusion in the left lateral region.  5. The mitral valve is normal in structure and function. Mild  mitral valve regurgitation.  6. The aortic valve is normal in structure and function. Aortic valve regurgitation is not visualized.  7. The inferior vena cava is dilated in size with >50% respiratory variability, suggesting right atrial pressure of 8 mmHg. Comparison(s): No prior Echocardiogram. FINDINGS  Left Ventricle: Left ventricular ejection fraction, by estimation, is 55 to 60%. The left ventricle has normal function. The left ventricle has no regional wall motion abnormalities. The left ventricular internal cavity size was normal in size. There is  no left ventricular hypertrophy. Left ventricular diastolic parameters are consistent with Grade II diastolic dysfunction (pseudonormalization). Elevated left atrial pressure. Right Ventricle: The right ventricular size is normal. No increase in right ventricular wall thickness. Right ventricular systolic function is normal. Tricuspid regurgitation signal is inadequate for assessing PA pressure. Left Atrium: Left atrial size was moderately dilated. Right Atrium: Right atrial size was normal in size. Pericardium: There is no evidence of pericardial effusion. Mitral Valve: The mitral valve is normal in structure and function. Mild mitral annular calcification. Mild mitral valve regurgitation. Tricuspid Valve: The tricuspid valve is normal in structure. Tricuspid valve regurgitation is not demonstrated. Aortic Valve: The aortic valve is normal in structure and function. Aortic valve regurgitation is not visualized. Pulmonic Valve: The pulmonic valve was grossly normal. Pulmonic valve regurgitation is not visualized. Aorta: The aortic root and ascending aorta are structurally normal, with no evidence of dilitation. Venous: The inferior vena cava is dilated in size with greater than 50% respiratory variability, suggesting right atrial pressure of 8 mmHg. IAS/Shunts: No atrial level shunt detected by color flow Doppler. Additional Comments: There is a large pleural  effusion in the left lateral region.  LEFT VENTRICLE PLAX 2D LVIDd:         3.79 cm  Diastology LVIDs:         2.87 cm  LV e' lateral:   6.00 cm/s LV PW:         1.27 cm  LV E/e' lateral: 18.3 LV IVS:        0.94 cm  LV e' medial:    3.57 cm/s LVOT diam:     1.80 cm  LV E/e' medial:  30.8 LV SV:         52 LV SV Index:   30 LVOT Area:     2.54 cm  RIGHT VENTRICLE            IVC RV Basal diam:  3.24 cm    IVC diam: 2.47 cm RV Mid diam:    1.67 cm RV S prime:     9.32 cm/s TAPSE (M-mode): 1.8 cm LEFT ATRIUM  Index       RIGHT ATRIUM           Index LA diam:        3.90 cm 2.24 cm/m  RA Area:     15.50 cm LA Vol (A2C):   79.2 ml 45.46 ml/m RA Volume:   42.40 ml  24.33 ml/m LA Vol (A4C):   61.2 ml 35.12 ml/m LA Biplane Vol: 73.7 ml 42.30 ml/m  AORTIC VALVE LVOT Vmax:   89.80 cm/s LVOT Vmean:  62.000 cm/s LVOT VTI:    0.204 m  AORTA Ao Root diam: 2.80 cm Ao Asc diam:  2.40 cm MITRAL VALVE MV Area (PHT): 3.72 cm     SHUNTS MV Decel Time: 204 msec     Systemic VTI:  0.20 m MV E velocity: 110.00 cm/s  Systemic Diam: 1.80 cm MV A velocity: 54.30 cm/s MV E/A ratio:  2.03 Mihai Croitoru MD Electronically signed by Sanda Klein MD Signature Date/Time: 10/23/2019/3:50:43 PM    Final    [place] EKG SITE RITE  Result Date: 10/22/2019 If Site Rite image not attached, placement could not be confirmed due to current cardiac rhythm.  US Abdomen Limited RUQ  Result Date: 10/22/2019 CLINICAL DATA:  Acute right upper quadrant abdominal pain. EXAM: ULTRASOUND ABDOMEN LIMITED RIGHT UPPER QUADRANT COMPARISON:  October 19, 2019. FINDINGS: Gallbladder: No gallstones or wall thickening visualized. No sonographic Murphy sign noted by sonographer. Common bile duct: Diameter: 5 mm which is within normal limits. Liver: No focal lesion identified. Within normal limits in parenchymal echogenicity. Portal vein is patent on color Doppler imaging with normal direction of blood flow towards the liver. Other: Minimal ascites is  noted. IMPRESSION: Minimal ascites. No other abnormality seen in the right upper quadrant of the abdomen. Electronically Signed   By: Marijo Conception M.D.   On: 10/22/2019 09:03

## 2019-10-28 NOTE — Progress Notes (Signed)
Patient up in the chair. Was encouraged to use IS and flutter valve. Will continue to monitor.

## 2019-10-29 LAB — COMPREHENSIVE METABOLIC PANEL
ALT: 18 U/L (ref 0–44)
AST: 19 U/L (ref 15–41)
Albumin: 2.3 g/dL — ABNORMAL LOW (ref 3.5–5.0)
Alkaline Phosphatase: 67 U/L (ref 38–126)
Anion gap: 9 (ref 5–15)
BUN: 35 mg/dL — ABNORMAL HIGH (ref 6–20)
CO2: 26 mmol/L (ref 22–32)
Calcium: 8.5 mg/dL — ABNORMAL LOW (ref 8.9–10.3)
Chloride: 99 mmol/L (ref 98–111)
Creatinine, Ser: 1.82 mg/dL — ABNORMAL HIGH (ref 0.44–1.00)
GFR calc Af Amer: 35 mL/min — ABNORMAL LOW (ref >60)
GFR calc non Af Amer: 30 mL/min — ABNORMAL LOW (ref >60)
Glucose, Bld: 262 mg/dL — ABNORMAL HIGH (ref 70–99)
Potassium: 3.6 mmol/L (ref 3.5–5.1)
Sodium: 134 mmol/L — ABNORMAL LOW (ref 135–145)
Total Bilirubin: 0.3 mg/dL (ref 0.3–1.2)
Total Protein: 5 g/dL — ABNORMAL LOW (ref 6.5–8.1)

## 2019-10-29 LAB — GLUCOSE, CAPILLARY
Glucose-Capillary: 203 mg/dL — ABNORMAL HIGH (ref 70–99)
Glucose-Capillary: 413 mg/dL — ABNORMAL HIGH (ref 70–99)
Glucose-Capillary: 145 mg/dL — ABNORMAL HIGH (ref 70–99)
Glucose-Capillary: 117 mg/dL — ABNORMAL HIGH (ref 70–99)
Glucose-Capillary: 171 mg/dL — ABNORMAL HIGH (ref 70–99)

## 2019-10-29 LAB — CBC WITH DIFFERENTIAL/PLATELET
Abs Immature Granulocytes: 0.16 10*3/uL — ABNORMAL HIGH (ref 0.00–0.07)
Basophils Absolute: 0 10*3/uL (ref 0.0–0.1)
Basophils Relative: 0 %
Eosinophils Absolute: 0.1 10*3/uL (ref 0.0–0.5)
Eosinophils Relative: 2 %
HCT: 27.4 % — ABNORMAL LOW (ref 36.0–46.0)
Hemoglobin: 8.4 g/dL — ABNORMAL LOW (ref 12.0–15.0)
Immature Granulocytes: 3 %
Lymphocytes Relative: 10 %
Lymphs Abs: 0.6 10*3/uL — ABNORMAL LOW (ref 0.7–4.0)
MCH: 24.6 pg — ABNORMAL LOW (ref 26.0–34.0)
MCHC: 30.7 g/dL (ref 30.0–36.0)
MCV: 80.4 fL (ref 80.0–100.0)
Monocytes Absolute: 0.5 10*3/uL (ref 0.1–1.0)
Monocytes Relative: 8 %
Neutro Abs: 4.7 10*3/uL (ref 1.7–7.7)
Neutrophils Relative %: 77 %
Platelets: 203 10*3/uL (ref 150–400)
RBC: 3.41 MIL/uL — ABNORMAL LOW (ref 3.87–5.11)
RDW: 13.5 % (ref 11.5–15.5)
WBC: 6.1 10*3/uL (ref 4.0–10.5)
nRBC: 0 % (ref 0.0–0.2)

## 2019-10-29 LAB — MAGNESIUM: Magnesium: 1.9 mg/dL (ref 1.7–2.4)

## 2019-10-29 LAB — D-DIMER, QUANTITATIVE: D-Dimer, Quant: 3.82 ug/mL-FEU — ABNORMAL HIGH (ref 0.00–0.50)

## 2019-10-29 LAB — BRAIN NATRIURETIC PEPTIDE: B Natriuretic Peptide: 334.7 pg/mL — ABNORMAL HIGH (ref 0.0–100.0)

## 2019-10-29 LAB — PROCALCITONIN: Procalcitonin: 0.21 ng/mL

## 2019-10-29 LAB — C-REACTIVE PROTEIN: CRP: 0.6 mg/dL (ref <1.0)

## 2019-10-29 MED ORDER — INSULIN ASPART 100 UNIT/ML ~~LOC~~ SOLN
30.0000 [IU] | Freq: Once | SUBCUTANEOUS | Status: AC
  Administered 2019-10-29: 30 [IU] via SUBCUTANEOUS

## 2019-10-29 MED ORDER — FUROSEMIDE 20 MG PO TABS
60.0000 mg | ORAL_TABLET | Freq: Two times a day (BID) | ORAL | Status: DC
  Administered 2019-10-29 – 2019-10-31 (×4): 60 mg via ORAL
  Filled 2019-10-29 (×5): qty 3

## 2019-10-29 MED ORDER — INSULIN GLARGINE 100 UNIT/ML ~~LOC~~ SOLN
30.0000 [IU] | Freq: Every day | SUBCUTANEOUS | Status: DC
  Administered 2019-10-29 – 2019-11-01 (×4): 30 [IU] via SUBCUTANEOUS
  Filled 2019-10-29 (×6): qty 0.3

## 2019-10-29 MED ORDER — CLONIDINE HCL 0.3 MG PO TABS
0.3000 mg | ORAL_TABLET | Freq: Two times a day (BID) | ORAL | Status: DC
  Administered 2019-10-29 – 2019-11-06 (×14): 0.3 mg via ORAL
  Filled 2019-10-29 (×14): qty 1

## 2019-10-29 NOTE — Progress Notes (Signed)
Physical Therapy Treatment Note  Patient demonstrated ability to stand using Stedy and two person assist. Plan to trial standing with RW with two person assist but patient too fatigued. She is agreeable to attempt later and reports that she wants to work on her standing balance. Upon PT return later, patient partial side sitting in recliner chair, declines attempting 2nd stand, declines repositioning to increase comfort. She reports not feeling well but when asked to describe or elaborate she is unable to. Patient declined needing anything at this time, call bell and all needs in reach.  Continued recommendation for discharge to SNF for short term rehabiliation.    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  She was recently hospitalized twice to [facility] for shortness of breath, her work-up showed bilateral transudative pleural effusion, she had thoracentesis suggesting transudative fluid with no malignant cells.  Reason for recurrent effusion was thought to be acute on chronic diastolic CHF.  She was also found to have liver mass.  She was finally transferred to [facility] for cardiology evaluation for recurrent CHF.  Also positive for Covid.   (patient transferred out of ICU to 5W 10/24/19)  Subjective Data  Subjective Patient requesting to stand a second time but not right now.  Patient Stated Goal to stand  Precautions  Precautions Fall  Precaution Comments R BKA April 2020  Required Braces or Orthoses Other Brace (prosthesis too small, not in hospital)  Pain Assessment  Pain Assessment No/denies pain (no complaints of pain, no signs/symptoms of pain)  Cognition  Arousal/Alertness Awake/alert  General Comments Patient alert, eager to participate at start of session and then after standing trial noted fatigue, unable to do more therapy.  Bed Mobility  Overal bed mobility Needs Assistance  Bed Mobility Supine to Sit  Supine to sit Min guard;HOB elevated  General bed mobility comments use of bedrail, contact guard for safety  Transfers  Overall transfer level Needs assistance  Transfer via [place] to/from Stand  Sit to Stand Min assist;+2 physical assistance;From elevated surface  General transfer comment Stedy for transfer to chair to work on standing balance. This session, patient does admit that she would stand in parallel bars at SNF but not very often.   Balance  Overall balance assessment Needs assistance  Sitting-balance support Feet supported  Sitting balance-Leahy Scale Good  Standing balance support Bilateral upper extremity supported (LLE blocked with Stedy to prevent buckling)  Standing balance comment Static standing balance with Stedy with assist x 2 people, max standing tolerance 45 seconds, patient able to shift weight when sitting on Stedy "seat"  General Comments  General comments (skin integrity, edema, etc.) On 2L [place] during session. Oxygen stable during session. BP post standing trial (taken seated) 98/69. Patient denied dizziness or lightheadedness during standing but reports the stand fatigued her.  PT - End of  Session  Equipment Utilized During Treatment Gait belt;Oxygen   Activity Tolerance Patient limited by fatigue  Patient left in chair;with call bell/phone within reach;with chair alarm set  Nurse Communication Mobility status (nurse cleared patient to participate in PT session)   PT - Assessment/Plan  PT Plan Current plan remains appropriate  PT Visit Diagnosis Muscle weakness (generalized) (M62.81)  PT Frequency (ACUTE ONLY) Min 2X/week  Follow Up Recommendations SNF;Supervision/Assistance - 24 hour  PT equipment Wheelchair cushion (measurements PT);Wheelchair (measurements PT);3in1 (PT)  AM-PAC PT "6 Clicks" Mobility Outcome Measure (Version 2)  Help needed turning from your back to your side while in a flat bed without using bedrails? 3  Help needed moving from lying on your back to sitting on the side of a flat bed without using bedrails? 3  Help needed moving to and from a bed to a chair (including a wheelchair)? 2  Help needed standing up from a chair using your arms (e.g., wheelchair or bedside chair)? 2  Help needed to walk in hospital room? 1  Help needed climbing 3-5 steps with a railing?  1  6 Click Score 12  Consider Recommendation of Discharge To: CIR/SNF/LTACH  PT Goal Progression  Progress towards PT goals Progressing toward goals  PT Time Calculation  PT Start Time (ACUTE ONLY) 1233  PT Stop Time (ACUTE ONLY) 1259  PT Time Calculation (min) (ACUTE ONLY) 26 min  PT General Charges  $$ ACUTE PT VISIT 1 Visit  PT Treatments  $Therapeutic Activity 8-22 mins   Birdie Hopes, DPT, PT Acute Rehab [phone] office

## 2019-10-29 NOTE — Progress Notes (Signed)
Patient ID: Jamie [name], female   DOB: [birth year], 60 y.o.   MRN: [id number]     Advanced Heart Failure Rounding Note  PCP-Cardiologist: No primary care provider on file.   Subjective:    Patient tested positive for COVID-19. Oxygen has been titrated down to 2L [place].  She seems to be better symptomatically.  She is off clevidipine.  SBP 140s-150s.    She has had right and left thoracenteses now, transudates.   Creatinine mildly higher at 1.8 today.  She is now on po Lasix.    Objective:   Weight Range: 68.3 kg Body mass index is 26.67 kg/m.   Vital Signs:   Temp:  [98 F (36.7 C)-98.6 F (37 C)] 98 F (36.7 C) (03/08 1200) Pulse Rate:  [70-80] 74 (03/08 1200) Resp:  [12-17] 16 (03/08 1200) BP: (111-167)/(52-65) 149/52 (03/08 1200) SpO2:  [97 %-100 %] 97 % (03/08 1200) Weight:  [68.3 kg] 68.3 kg (03/08 0646) Last BM Date: 10/29/19  Weight change: Filed Weights   10/28/19 0324 10/28/19 0512 10/29/19 0646  Weight: 68.6 kg 68.5 kg 68.3 kg    Intake/Output:   Intake/Output Summary (Last 24 hours) at 10/29/2019 1337 Last data filed at 10/29/2019 1200 Gross per 24 hour  Intake 902 ml  Output 2150 ml  Net -1248 ml      Physical Exam    General: NAD Neck: JVP 10-11 cm, no thyromegaly or thyroid nodule.  Lungs: Clear to auscultation bilaterally with normal respiratory effort. CV: Nondisplaced PMI.  Heart regular S1/S2, no S3/S4, no murmur.  No peripheral edema.   Abdomen: Soft, nontender, no hepatosplenomegaly, no distention.  Skin: Intact without lesions or rashes.  Neurologic: Alert and oriented x 3.  Psych: Normal affect. Extremities: No clubbing or cyanosis.  HEENT: Normal.    Telemetry   NSR 70s, personally reviewed  Labs    CBC Recent Labs    10/28/19 0330 10/29/19 0309  WBC 5.5 6.1  NEUTROABS 4.0 4.7  HGB 9.1* 8.4*  HCT 28.9* 27.4*  MCV 79.8* 80.4  PLT 199 841   Basic Metabolic Panel Recent Labs    10/28/19 0330 10/29/19 0309  NA 136 134*    K 3.8 3.6  CL 102 99  CO2 26 26  GLUCOSE 201* 262*  BUN 40* 35*  CREATININE 1.56* 1.82*  CALCIUM 8.5* 8.5*  MG 1.5* 1.9   Liver Function Tests Recent Labs    10/28/19 0330 10/29/19 0309  AST 18 19  ALT 17 18  ALKPHOS 52 67  BILITOT 0.4 0.3  PROT 5.1* 5.0*  ALBUMIN 2.2* 2.3*   No results for input(s): LIPASE, AMYLASE in the last 72 hours. Cardiac Enzymes No results for input(s): CKTOTAL, CKMB, CKMBINDEX, TROPONINI in the last 72 hours.  BNP: BNP (last 3 results) Recent Labs    10/27/19 0418 10/28/19 0330 10/29/19 0309  BNP 822.5* 770.4* 334.7*    ProBNP (last 3 results) No results for input(s): PROBNP in the last 8760 hours.   D-Dimer Recent Labs    10/28/19 0330 10/29/19 0309  DDIMER 3.47* 3.82*   Hemoglobin A1C No results for input(s): HGBA1C in the last 72 hours. Fasting Lipid Panel No results for input(s): CHOL, HDL, LDLCALC, TRIG, CHOLHDL, LDLDIRECT in the last 72 hours. Thyroid Function Tests No results for input(s): TSH, T4TOTAL, T3FREE, THYROIDAB in the last 72 hours.  Invalid input(s): FREET3  Other results:   Imaging    No results found.   Medications:  Scheduled Medications: . amLODipine  10 mg Oral Daily  . aspirin EC  81 mg Oral Daily  . atorvastatin  80 mg Oral QHS  . busPIRone  5 mg Oral Daily  . Chlorhexidine Gluconate Cloth  6 each Topical Daily  . clonazepam  0.25 mg Oral BID  . cloNIDine  0.2 mg Oral BID  . enoxaparin (LOVENOX) injection  40 mg Subcutaneous Q24H  . FLUoxetine  40 mg Oral Daily  . folic acid  1 mg Oral Daily  . furosemide  60 mg Oral BID  . hydrALAZINE  100 mg Oral Q8H  . insulin aspart  0-15 Units Subcutaneous TID WC  . insulin aspart  0-5 Units Subcutaneous QHS  . insulin glargine  30 Units Subcutaneous QHS  . isosorbide mononitrate  60 mg Oral Daily  . labetalol  300 mg Oral BID  . multivitamin with minerals  1 tablet Oral Daily  . pantoprazole  40 mg Oral Daily  . spironolactone  25 mg  Oral Daily  . thiamine  100 mg Oral Daily    Infusions:   PRN Medications: acetaminophen, albuterol, ALPRAZolam, hydrALAZINE, HYDROcodone-acetaminophen, ipratropium-albuterol, morphine injection, nitroGLYCERIN, ondansetron (ZOFRAN) IV, sodium chloride flush   Assessment/Plan   1. Acute hypoxemic respiratory failure: COVID-19 viral pneumonia + CHF exacerbation.  Weight is down with diuresis, titrating down oxygen. - Completed steroids and remdesivir for COVID-19.  - Back on po Lasix.  2. Acute on chronic diastolic CHF: Last echo at [facility] 2/21 with EF 55-60%, restrictive diastolic function.  Echo here with EF 55-60%, normal RV, IVC suggesting RA pressure 8 mmHg. Creatinine 1.8 today, mildly higher.  Multiple admissions with HTN and CHF exacerbation.  Weight is down, JVP not elevated today.   - Continue po Lasix for now but will hold tomorrow if creatinine continues to rise.    - Would eventually benefit from Cardiomems placement => discussed with patient today.  - Plan for RHC/LHC, hopefully Wednesday.  Would like to see creatinine trend down.   3. Pleural effusions: Bilateral.  Had thoracenteses at [facility] and again bilaterally here. Transudates with negative cytology. Suspect due to CHF.   4. HTN: Poorly controlled, admitted with hypertensive emergency/CHF.  - Continue amlodipine 10 mg daily.  - Continue hydralazine 100 mg tid. - Continue labetalol 300 bid - can increase clonidine to 0.3 bid.   - Hold off on ACEI/ARB with AKI.  5. AKI: Creatinine to 2.4 with diuresis, now 1.8.  6. Abdominal pain: CT abdomen earlier in admission with 1.5 cm liver lesion but no other significant abdominal abnormality.  She has had abdominal pain throughout this admission.  CT abdomen 3/1 showed no acute abdominal findings.  7. COPD: Per report on 3L home oxygen.  8. DM: SSI, per [facility].   Length of Stay: 8  Loralie Champagne, MD  10/29/2019, 1:37 PM  Advanced Heart Failure Team Pager [phone] (M-F;  7a - 4p)  Please contact [facility] for night-coverage after hours (4p -7a ) and weekends on [email or web address]

## 2019-10-29 NOTE — Plan of Care (Signed)
  Problem: Clinical Measurements: Goal: Ability to maintain clinical measurements within normal limits will improve Outcome: Not Progressing Goal: Will remain free from infection Outcome: Not Progressing Goal: Diagnostic test results will improve Outcome: Not Progressing Goal: Cardiovascular complication will be avoided Outcome: Not Progressing   Problem: Activity: Goal: Risk for activity intolerance will decrease Outcome: Not Progressing   Problem: Nutrition: Goal: Adequate nutrition will be maintained Outcome: Not Progressing   Problem: Coping: Goal: Level of anxiety will decrease Outcome: Not Progressing   Problem: Elimination: Goal: Will not experience complications related to bowel motility Outcome: Not Progressing Goal: Will not experience complications related to urinary retention Outcome: Not Progressing   Problem: Education: Goal: Ability to demonstrate management of disease process will improve Outcome: Not Progressing Goal: Ability to verbalize understanding of medication therapies will improve Outcome: Not Progressing Goal: Individualized Educational Video(s) Outcome: Not Progressing   Problem: Activity: Goal: Capacity to carry out activities will improve Outcome: Not Progressing   Problem: Cardiac: Goal: Ability to achieve and maintain adequate cardiopulmonary perfusion will improve Outcome: Not Progressing   Problem: Education: Goal: Knowledge of risk factors and measures for prevention of condition will improve Outcome: Not Progressing   Problem: Coping: Goal: Psychosocial and spiritual needs will be supported Outcome: Not Progressing   Problem: Respiratory: Goal: Will maintain a patent airway Outcome: Not Progressing Goal: Complications related to the disease process, condition or treatment will be avoided or minimized Outcome: Not Progressing

## 2019-10-29 NOTE — Progress Notes (Signed)
PROGRESS NOTE                                                                                                                                                                                                             Patient Demographics:    Jamie [name], is a 59 y.o. 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    She was recently hospitalized twice to [facility] for shortness of breath, her work-up showed bilateral transudative pleural effusion, she had thoracentesis suggesting transudative fluid with no malignant cells.  Reason for recurrent effusion was thought to be acute on chronic diastolic CHF.  She was also found to have liver mass.  She was finally transferred to [facility] for cardiology evaluation for recurrent CHF.   Subjective:   Patient in bed, appears comfortable, denies any headache, no fever, no chest pain or pressure, no shortness of breath , no abdominal pain. No focal weakness.   Assessment  & Plan :    1.  Severe Acute hypoxic respiratory failure due to recurrent Acute on chronic diastolic CHF EF 09% .  Recurrent bilateral pleural effusions which are transudate if worked up in [place], EF 60%.    He had massive pulmonary edema with pleural effusion at the time of presentation and almost got intubated, was aggressively diuresed and monitored in ICU for a day, now breathing much improved.  She also underwent  ultrasound-guided thoracentesis on 3/3 and 3/4 respectively with 750 cc of transudative fluid removed from right side and 1300 cc of fluid removed from the left side.  Again this appears transudative with mesothelial cells and cytology.  Patient has had 2 other bouts of thoracentesis at [facility] earlier this month.  Due to AKI her diuretics had to be stopped for a few days they were  resumed on 10/26/2019, dose has been increased on 10/28/2019.  Plan is if pleural effusion continues to get worse then she will get Pleurx catheter have discussed this with pulmonary.  She was also seen by pulmonary and cardiology this admission.  Currently clinically minimal pleural effusions, hypoxia much improved with ongoing diuresis, case discussed with pulmonary physician Dr. Valeta Harms on 10/29/2019 who reviewed the chest x-ray and recommended to continue diuresis and if stable follow-up with pulmonary in the office.  He will try and arrange the follow-up visit.   2. Acute Covid 19 Viral Pneumonitis during the ongoing 2020 Covid 19 Pandemic - her main disease process and hypoxia is due to #1 above which is acute on chronic diastolic CHF.  Her Covid disease itself seems to be mild to moderate for which he has completed her treatment with remdesivir and steroids.  Encouraged the patient to sit up in chair in the daytime use I-S and flutter valve for pulmonary toiletry and then prone in bed when at night.  SpO2: 99 % O2 Flow Rate (L/min): 2 L/min FiO2 (%): 40 %  Recent Labs  Lab 10/24/19 0354 10/25/19 0520 10/25/19 0521 10/26/19 0313 10/27/19 0418 10/28/19 0330 10/29/19 0309  CRP  --  1.5*  --  0.8 0.6 0.8 0.6  DDIMER  --  3.24*  --  3.02* 3.02* 3.47* 3.82*  BNP   < >  --  945.6* 864.8* 822.5* 770.4* 334.7*  PROCALCITON  --  0.37  --  0.32 0.31 0.22 0.21   < > = values in this interval not displayed.    Hepatic Function Latest Ref Rng & Units 10/29/2019 10/28/2019 10/27/2019  Total Protein 6.5 - 8.1 g/dL 5.0(L)  5.1(L) 4.9(L)  Albumin 3.5 - 5.0 g/dL 2.3(L) 2.2(L) 2.2(L)  AST 15 - 41 U/L 19 18 17   ALT 0 - 44 U/L 18 17 17   Alk Phosphatase 38 - 126 U/L 67 52 55  Total Bilirubin 0.3 - 1.2 mg/dL 0.3 0.4 0.4      3.  PAD.  S/p right BKA, CT evidence of abdominal diffuse calcification with recurrent abdominal pain.  Highly questionable for ischemic bowel, for now continue aspirin and statin for secondary prevention, clear liquid diet and monitor.  She likely has diffuse atherosclerosis due to uncontrolled diabetes mellitus and hypertension.  She denies any history of smoking, with supportive care she is somewhat better and tolerating liquid diet.  4.  Hypertensive crisis. Stopped  IV Clevidipine drip, oral medications adjusted and as needed IV hydralazine added.  Currently on combination of beta-blocker, Norvasc, clonidine, hydralazine along with Imdur.   5.  GERD with nausea.  Also has HX of diabetic gastroparesis.  For now supportive care with clear liquid diet, PPI and nausea medications.   6.  Dyslipidemia.  Continue statin.  7.  Anemia of chronic disease.  Monitor.  8.  Hypertensive urgency.  Resolved.  9.  Depression and anxiety.  Continue combination of bupropion, fluoxetine, buspirone and Xanax.  10.  AKI at [facility] with creatinine of 1.5.  Renal function worse likely due to combination of IV contrast from CT and diuresis, Lasix was held and she was gently hydrated for 2 days from 10/25/2019 to 10/26/2019 with total of 2 L of normal saline, creatinine has stabilized and now trending down, will commence low-dose Lasix on 10/27/2019 as she is developing crackles and evidence of reoccurrence of pleural effusion.  11. Liver mass suspicious on CT scan.  No mass noted on MRI.  12.  Hypomagnesemia.  Replaced.   13.  DM2 - placed on Lantus sliding scale along with Premeal NovoLog.  Monitor and adjust  Lab Results  Component Value Date   HGBA1C 6.9 (H) 10/23/2019     CBG (last 3)  Recent Labs     10/28/19 1642 10/28/19 2022 10/29/19 0758  GLUCAP 220* 181* 203*     Condition - Extremely Guarded  Family Communication  :  Daughter in Radiographer, therapeutic on 10/22/19 @ [phone], called on 10/23/2019 at 10:20 AM - full mailbox.  Code Status :  Full  Diet :   Diet Order            Diet Heart Room service appropriate? Yes; Fluid consistency: Thin; Fluid restriction: 1200 mL Fluid  Diet effective now               Disposition Plan  : In the hospital, still getting high-dose IV diuretics for CHF.  Pleural effusion recurrent, if reoccurs and continues to get worse will require bilateral Pleurx catheter placed this admission  Consults  : Pulmonary, cardiology  Procedures  :    MRI abdomen.  No suspicious liver lesion.    Ultrasound-guided right-sided thoracentesis on 10/24/2019 with 750 cc of fluid removed.  Done by pulmonary.  Ultrasound guided left-sided thoracentesis on 10/25/2019 with 1300 cc of fluid removed.  Done by pulmonary  Renal ultrasound.  CT - 1. Persistent moderate to large bilateral pleural effusions with overlying atelectasis. 2. No acute abdominal/pelvic findings, mass lesions or adenopathy. 3. Severe/advanced atherosclerotic calcifications involving the aorta and branch vessels for age. 4. Small to moderate amount of persistent free pelvic fluid. No obvious cause. 5. Calcified uterine fibroids. Aortic Atherosclerosis.  TTE from [facility] - EF 60%, dCHF  PICC line and Foley  requested.  PUD Prophylaxis : PPI  DVT Prophylaxis  :  Lovenox    Lab Results  Component Value Date   PLT 203 10/29/2019    Inpatient Medications  Scheduled Meds: . amLODipine  10 mg Oral Daily  . aspirin EC  81 mg Oral Daily  . atorvastatin  80 mg Oral QHS  . busPIRone  5 mg Oral Daily  . Chlorhexidine Gluconate Cloth  6 each Topical Daily  . clonazepam  0.25 mg Oral BID  . cloNIDine  0.2 mg Oral BID  . enoxaparin (LOVENOX) injection  40 mg Subcutaneous Q24H  . FLUoxetine  40 mg Oral Daily    . folic acid  1 mg Oral Daily  . furosemide  60 mg Oral BID  . hydrALAZINE  100 mg Oral Q8H  . insulin aspart  0-15 Units Subcutaneous TID WC  . insulin aspart  0-5 Units Subcutaneous QHS  . insulin glargine  25 Units Subcutaneous QHS  . isosorbide mononitrate  60 mg Oral Daily  . labetalol  300 mg Oral BID  . multivitamin with minerals  1 tablet Oral Daily  . pantoprazole  40 mg Oral Daily  . spironolactone  25 mg Oral Daily  . thiamine  100 mg Oral Daily   Continuous Infusions:  PRN Meds:.acetaminophen, albuterol, ALPRAZolam, hydrALAZINE, HYDROcodone-acetaminophen, ipratropium-albuterol, morphine injection, nitroGLYCERIN, ondansetron (ZOFRAN) IV, sodium chloride flush  Antibiotics  :    Anti-infectives (From admission, onward)   Start     Dose/Rate Route Frequency Ordered Stop   10/23/19 1000  remdesivir 100 mg in sodium chloride 0.9 % 100 mL IVPB     100 mg 200 mL/hr over 30 Minutes Intravenous Daily 10/22/19 1713  10/26/19 1006   10/22/19 1800  remdesivir 200 mg in sodium chloride 0.9% 250 mL IVPB     200 mg 580 mL/hr over 30 Minutes Intravenous Once 10/22/19 1713 10/22/19 1843   10/21/19 2100  cefTRIAXone (ROCEPHIN) 1 g in sodium chloride 0.9 % 100 mL IVPB  Status:  Discontinued     1 g 200 mL/hr over 30 Minutes Intravenous Every 24 hours 10/21/19 1744 10/22/19 0748   10/21/19 2100  azithromycin (ZITHROMAX) tablet 500 mg  Status:  Discontinued     500 mg Oral Daily 10/21/19 1744 10/22/19 0748       Time Spent in minutes  30   Lala Lund M.D on 10/29/2019 at 10:42 AM  To page go to [email or web address] - password [facility]  [facility] -  Office  [phone]     See all Orders from today for further details    Objective:   Vitals:   10/28/19 2300 10/29/19 0300 10/29/19 0646 10/29/19 0726  BP: (!) 133/54 (!) 153/65  (!) 167/63  Pulse: 73 70 76 80  Resp: 15 17 12 15   Temp: 98.6 F (37 C) 98.3 F (36.8 C)  98 F (36.7 C)  TempSrc: Oral Oral  Oral  SpO2:  99% 100% 100% 99%  Weight:   68.3 kg   Height:        Wt Readings from Last 3 Encounters:  10/29/19 68.3 kg     Intake/Output Summary (Last 24 hours) at 10/29/2019 1042 Last data filed at 10/29/2019 0800 Gross per 24 hour  Intake 740 ml  Output 1850 ml  Net -1110 ml     Physical Exam  Awake Alert, No new F.N deficits, Normal affect [place].AT,PERRAL Supple Neck,No JVD, No cervical lymphadenopathy appriciated.  Symmetrical Chest wall movement, mild bibasilar Rales with reduced bibasilar breath sounds RRR,No Gallops, Rubs or new Murmurs, No Parasternal Heave +ve B.Sounds, Abd Soft, No tenderness, No organomegaly appriciated, No rebound - guarding or rigidity. No Cyanosis, right BKA     Data Review:    CBC Recent Labs  Lab 10/25/19 0520 10/26/19 0313 10/27/19 0418 10/28/19 0330 10/29/19 0309  WBC 4.3 5.6 5.6 5.5 6.1  HGB 8.2* 8.0* 8.5* 9.1* 8.4*  HCT 26.3* 26.4* 27.1* 28.9* 27.4*  PLT 207 201 198 199 203  MCV 82.2 81.2 80.7 79.8* 80.4  MCH 25.6* 24.6* 25.3* 25.1* 24.6*  MCHC 31.2 30.3 31.4 31.5 30.7  RDW 13.8 13.6 13.6 13.6 13.5  LYMPHSABS 0.3* 0.3* 0.7 0.7 0.6*  MONOABS 0.2 0.2 0.5 0.5 0.5  EOSABS 0.0 0.0 0.1 0.1 0.1  BASOSABS 0.0 0.0 0.0 0.0 0.0    Chemistries  Recent Labs  Lab 10/25/19 0520 10/26/19 0313 10/27/19 0418 10/28/19 0330 10/29/19 0309  NA 133* 133* 136 136 134*  K 4.7 4.8 4.3 3.8 3.6  CL 98 98 101 102 99  CO2 24 24 27 26 26   GLUCOSE 381* 296* 194* 201* 262*  BUN 62* 66* 58* 40* 35*  CREATININE 3.30* 2.91* 2.35* 1.56* 1.82*  CALCIUM 8.0* 8.3* 8.5* 8.5* 8.5*  MG 1.9 1.9 1.8 1.5* 1.9  AST 17 15 17 18 19   ALT 17 16 17 17 18   ALKPHOS 54 52 55 52 67  BILITOT 0.4 0.4 0.4 0.4 0.3   ------------------------------------------------------------------------------------------------------------------ No results for input(s): CHOL, HDL, LDLCALC, TRIG, CHOLHDL, LDLDIRECT in the last 72 hours.  Lab Results  Component Value Date   HGBA1C 6.9 (H)  10/23/2019   ------------------------------------------------------------------------------------------------------------------ No results for input(s): TSH, T4TOTAL,  T3FREE, THYROIDAB in the last 72 hours.  Invalid input(s): FREET3  Cardiac Enzymes No results for input(s): CKMB, TROPONINI, MYOGLOBIN in the last 168 hours.  Invalid input(s): CK ------------------------------------------------------------------------------------------------------------------    Component Value Date/Time   BNP 334.7 (H) 10/29/2019 0309    Micro Results Recent Results (from the past 240 hour(s))  SARS CORONAVIRUS 2 (TAT 6-24 HRS) Nasopharyngeal Nasopharyngeal Swab     Status: Abnormal   Collection Time: 10/21/19  6:39 PM   Specimen: Nasopharyngeal Swab  Result Value Ref Range Status   SARS Coronavirus 2 POSITIVE (A) NEGATIVE Final    Comment: RESULT CALLED TO, READ BACK BY AND VERIFIED WITH: K. AJRDY,RN 0017 10/22/2019 T. TYSOR (NOTE) SARS-CoV-2 target nucleic acids are DETECTED. The SARS-CoV-2 RNA is generally detectable in upper and lower respiratory specimens during the acute phase of infection. Positive results are indicative of the presence of SARS-CoV-2 RNA. Clinical correlation with patient history and other diagnostic information is  necessary to determine patient infection status. Positive results do not rule out bacterial infection or co-infection with other viruses.  The expected result is Negative. Fact Sheet for Patients: SugarRoll.be Fact Sheet for Healthcare Providers: https://www.woods-mathews.com/ This test is not yet approved or cleared by the [place] FDA and  has been authorized for detection and/or diagnosis of SARS-CoV-2 by FDA under an Emergency Use Authorization (EUA). This EUA will remain  in effect (meaning this test can be used) for th e duration of the COVID-19 declaration under Section 564(b)(1) of the Act, 21  U.S.C. section 360bbb-3(b)(1), unless the authorization is terminated or revoked sooner. Performed at [facility], [place] [address] [place], [place] [address]   MRSA PCR Screening     Status: None   Collection Time: 10/22/19  2:51 PM   Specimen: Nasal Mucosa; Nasopharyngeal  Result Value Ref Range Status   MRSA by PCR NEGATIVE NEGATIVE Final    Comment:        The GeneXpert MRSA Assay (FDA approved for NASAL specimens only), is one component of a comprehensive MRSA colonization surveillance program. It is not intended to diagnose MRSA infection nor to guide or monitor treatment for MRSA infections. Performed at [facility], [place] [address] [place], [place] [address]   Stat Gram stain     Status: None   Collection Time: 10/24/19 11:14 AM   Specimen: Pleura; Body Fluid  Result Value Ref Range Status   Specimen Description PLEURAL RIGHT  Final   Special Requests NONE  Final   Gram Stain   Final    RARE WBC PRESENT, PREDOMINANTLY MONONUCLEAR NO ORGANISMS SEEN Performed at [facility], [address] [place], [place] [address]    Report Status 10/24/2019 FINAL  Final  Respiratory Panel by PCR     Status: None   Collection Time: 10/25/19  4:18 AM   Specimen: Nasopharyngeal Swab; Respiratory  Result Value Ref Range Status   Adenovirus NOT DETECTED NOT DETECTED Final   Coronavirus 229E NOT DETECTED NOT DETECTED Final    Comment: (NOTE) The Coronavirus on the Respiratory Panel, DOES NOT test for the novel  Coronavirus (2019 nCoV)    Coronavirus HKU1 NOT DETECTED NOT DETECTED Final   Coronavirus NL63 NOT DETECTED NOT DETECTED Final   Coronavirus OC43 NOT DETECTED NOT DETECTED Final   Metapneumovirus NOT DETECTED NOT DETECTED Final   Rhinovirus / Enterovirus NOT DETECTED NOT DETECTED Final   Influenza A NOT DETECTED NOT DETECTED Final   Influenza B NOT  DETECTED NOT DETECTED Final   Parainfluenza Virus 1 NOT DETECTED NOT DETECTED Final   Parainfluenza  Virus 2 NOT DETECTED NOT DETECTED Final   Parainfluenza Virus 3 NOT DETECTED NOT DETECTED Final   Parainfluenza Virus 4 NOT DETECTED NOT DETECTED Final   Respiratory Syncytial Virus NOT DETECTED NOT DETECTED Final   Bordetella pertussis NOT DETECTED NOT DETECTED Final   Chlamydophila pneumoniae NOT DETECTED NOT DETECTED Final   Mycoplasma pneumoniae NOT DETECTED NOT DETECTED Final    Comment: Performed at [facility], [place] [address] [place], [place] [address]  Body fluid culture (includes gram stain)     Status: None   Collection Time: 10/25/19  4:29 PM   Specimen: Pleural Fluid  Result Value Ref Range Status   Specimen Description FLUID PLEURAL  Final   Special Requests NONE  Final   Gram Stain   Final    FEW WBC PRESENT, PREDOMINANTLY MONONUCLEAR NO ORGANISMS SEEN    Culture   Final    NO GROWTH 3 DAYS Performed at [facility], [place] [address], [place], [place] [address]    Report Status 10/28/2019 FINAL  Final    Radiology Reports MR ABDOMEN WO CONTRAST  Result Date: 10/28/2019 CLINICAL DATA:  59 year old female with history of liver lesion. Follow-up study. EXAM: MRI ABDOMEN WITHOUT CONTRAST TECHNIQUE: Multiplanar multisequence MR imaging was performed without the administration of intravenous contrast. COMPARISON:  No prior abdominal MRI. CT the abdomen and pelvis 10/22/2019. FINDINGS: Comment: Today's study is limited by lack of IV gadolinium for detection and characterization of visceral and/or vascular lesions. Lower chest: Large right and moderate left pleural effusions. Signal in the visualize lung bases dependently, nonspecific, but favored to reflect areas of passive atelectasis. Hepatobiliary: No definite suspicious cystic or solid hepatic lesions are confidently identified on today's noncontrast examination. No intra or extrahepatic biliary ductal dilatation. Gallbladder is normal in appearance. Pancreas: No definite pancreatic mass or peripancreatic fluid  collections or inflammatory changes noted on today's noncontrast CT examination. Spleen:  Unremarkable. Adrenals/Urinary Tract: Unenhanced appearance of the kidneys and bilateral adrenal glands are normal. Stomach/Bowel: Visualized portions are unremarkable. Vascular/Lymphatic: No aneurysm identified in the visualized abdominal vasculature. No lymphadenopathy noted in the abdomen. Other: No significant volume of ascites noted in the visualized portions of the peritoneal cavity. Musculoskeletal: No aggressive appearing osseous lesions are noted in the visualized portions of the skeleton. Mild diffuse body wall edema. IMPRESSION: 1. The lesion of concern on the prior CT scan is not demonstrated on today's noncontrast MRI examination. This may suggest a benign perfusion anomaly on the prior CT scan. Regardless, this lesion is unlikely to be related to the patient's history of abdominal pain. If there is persistent clinical concern, this lesion could be further evaluated with repeat abdominal MRI with and without IV gadolinium. 2. Large right and moderate left pleural effusions with areas of probable passive atelectasis in the dependent portions of the lung bases bilaterally. 3. Mild diffuse body wall edema. Electronically Signed   By: Vinnie Langton M.D.   On: 10/28/2019 08:17   CT ABDOMEN PELVIS W CONTRAST  Result Date: 10/22/2019 CLINICAL DATA:  Diffuse abdominal pain and anemia. EXAM: CT ABDOMEN AND PELVIS WITH CONTRAST TECHNIQUE: Multidetector CT imaging of the abdomen and pelvis was performed using the standard protocol following bolus administration of intravenous contrast. CONTRAST:  28m OMNIPAQUE IOHEXOL 300 MG/ML  SOLN COMPARISON:  CT scan 10/19/2019 FINDINGS: Lower chest: Persistent moderate to large bilateral pleural effusions with  significant overlying atelectasis. The heart is normal in size. No pericardial effusion. Age advanced coronary artery calcifications are noted. Hepatobiliary: No focal  hepatic lesions or intrahepatic biliary dilatation. Ill-defined area of contrast enhancement and segment 7 likely a vascular shunt. The gallbladder is mildly contracted. No common bile duct dilatation. Pancreas: Scattered calcifications or likely due to prior inflammation. No worrisome pancreatic lesions or acute inflammatory process. No ductal dilatation. Spleen: Normal size. No focal lesions. Adrenals/Urinary Tract: The adrenal glands and kidneys are unremarkable. No worrisome renal lesions or hydronephrosis. The bladder appears normal. Stomach/Bowel: The stomach, duodenum, small bowel and colon are grossly normal without oral contrast. No acute inflammatory changes, mass lesions or obstructive findings. Vascular/Lymphatic: Severe/advanced atherosclerotic calcifications involving the aorta and branch vessels for age. No aneurysm or dissection. The major venous structures are patent. No mesenteric or retroperitoneal mass or adenopathy. Reproductive: The uterus and ovaries are unremarkable. Calcified fibroid again noted posteriorly. Other: Small to moderate amount of free pelvic fluid is again demonstrated. No obvious cause. Musculoskeletal: Stable compression fracture of L1. No worrisome bone lesions. Age advanced osteoporosis. IMPRESSION: 1. Persistent moderate to large bilateral pleural effusions with overlying atelectasis. 2. No acute abdominal/pelvic findings, mass lesions or adenopathy. 3. Severe/advanced atherosclerotic calcifications involving the aorta and branch vessels for age. 4. Small to moderate amount of persistent free pelvic fluid. No obvious cause. 5. Calcified uterine fibroids. Aortic Atherosclerosis (ICD10-I70.0). Electronically Signed   By: Marijo Sanes M.D.   On: 10/22/2019 13:56   US RENAL  Result Date: 10/25/2019 CLINICAL DATA:  Acute renal failure EXAM: RENAL / URINARY TRACT ULTRASOUND COMPLETE COMPARISON:  None. FINDINGS: Right Kidney: Renal measurements: 13.5 x 5.7 x 4.7 cm. = volume:  188 mL . Echogenicity within normal limits. No mass or hydronephrosis visualized. Left Kidney: Renal measurements: 14.2 x 4.9 x 4.3 cm = volume: 157 mL. Echogenicity within normal limits. No mass or hydronephrosis visualized. Bladder: Decompressed by Foley catheter. Other: None. IMPRESSION: No acute abnormality noted. Electronically Signed   By: Inez Catalina M.D.   On: 10/25/2019 22:11   DG Chest Port 1 View  Result Date: 10/28/2019 CLINICAL DATA:  Shortness of breath, COVID positive EXAM: PORTABLE CHEST 1 VIEW COMPARISON:  10/26/2019 FINDINGS: Increased density at the lung bases. No pneumothorax. Stable cardiomediastinal contours. Right PICC line is again noted. IMPRESSION: Increased density at the lung bases probably reflecting a combination of pleural effusion and atelectasis. Electronically Signed   By: Macy Mis M.D.   On: 10/28/2019 07:50   DG Chest Port 1 View  Result Date: 10/26/2019 CLINICAL DATA:  Shortness of breath, COVID positive EXAM: PORTABLE CHEST 1 VIEW COMPARISON:  10/25/2019 FINDINGS: Right-sided PICC line terminates in the distal superior vena cava. Heart size is stable and normal accounting for projection. Increasing opacity at the left lung base without silhouetting of left hemidiaphragm. Subtle opacities are seen on a background of mild interstitial prominence worse in the left lung base, also present in right mid chest and lower chest. Visualized skeletal structures are unremarkable. IMPRESSION: 1. Increasing opacity at the left lung base without silhouetting of the left hemidiaphragm. Findings may represent developing effusion. 2. Subtle opacities in the right mid chest and lower chest. Findings could be seen in the setting of COVID-19 pneumonia. Continued follow-up suggested. Electronically Signed   By: Zetta Bills M.D.   On: 10/26/2019 08:59   DG Chest Port 1 View  Result Date: 10/25/2019 CLINICAL DATA:  Status post thoracentesis. EXAM: PORTABLE CHEST 1 VIEW COMPARISON:  Radiograph earlier this day. Lung bases from abdominal CT 10/22/2019 FINDINGS: Decreased left pleural effusion. No visualized pneumothorax. Hazy opacity at the right lung base likely small right pleural effusion and atelectasis. Right upper extremity PICC remains in place. Borderline cardiomegaly with unchanged mediastinal contours. Improved pulmonary edema from earlier this day. Bones are under mineralized. IMPRESSION: 1. Decreased left pleural effusion after thoracentesis. No visualized pneumothorax. 2. Hazy opacity at the right lung base likely small right pleural effusion and atelectasis. 3. Improving pulmonary edema. Electronically Signed   By: Keith Rake M.D.   On: 10/25/2019 17:17   DG Chest Port 1 View  Result Date: 10/25/2019 CLINICAL DATA:  Status post thoracentesis. COVID positive. EXAM: PORTABLE CHEST 1 VIEW COMPARISON:  One-view chest x-ray 10/23/19 FINDINGS: Heart size is normal. Right-sided PICC line terminates in the distal SVC. Left pleural effusion and airspace disease is improved. No pneumothorax is present. Mild edema is present. IMPRESSION: 1. Improving left pleural effusion and airspace disease. 2. No pneumothorax. 3. Mild edema. Electronically Signed   By: San Morelle M.D.   On: 10/25/2019 06:57   DG CHEST PORT 1 VIEW  Result Date: 10/23/2019 CLINICAL DATA:  Acute respiratory insufficiency EXAM: PORTABLE CHEST 1 VIEW COMPARISON:  October 22, 2019 FINDINGS: The heart size and mediastinal contours are unchanged with mild cardiomegaly. There is a small left pleural effusion. There is slight interval improvement in the hazy/interstitial markings throughout both lungs. No acute osseous abnormality. IMPRESSION: Slight interval improvement in the bilateral hazy airspace opacities which could be due to pulmonary edema or infectious etiology. Small left pleural effusion. Electronically Signed   By: Prudencio Pair M.D.   On: 10/23/2019 06:32   DG Chest Port 1 View  Result Date:  10/22/2019 CLINICAL DATA:  CHF respiratory distress.  COVID-19 positive EXAM: PORTABLE CHEST 1 VIEW COMPARISON:  10/20/2019 FINDINGS: Diffuse bilateral airspace disease with basilar predominance shows mild progression. Moderate left effusion has improved in the interval. Probable small right effusion. IMPRESSION: Mild progression of diffuse bilateral airspace disease which may represent edema or pneumonia. Improvement in left pleural effusion. Electronically Signed   By: Franchot Gallo M.D.   On: 10/22/2019 09:28   ECHOCARDIOGRAM COMPLETE  Result Date: 10/23/2019    ECHOCARDIOGRAM REPORT   Patient Name:   Jesica Platner  Date of Exam: 10/23/2019 Medical Rec #:  [id number]  Height:       63.0 in Accession #:    [id number] Weight:       156.5 lb Date of Birth:  1961/04/16 BSA:          1.742 m Patient Age:    1 years   BP:           124/65 mmHg Patient Gender: F          HR:           66 bpm. Exam Location:  Inpatient Procedure: 2D Echo, Cardiac Doppler and Color Doppler Indications:    I50.33 Acute on chronic diastolic (congestive) heart failure  History:        Patient has no prior history of Echocardiogram examinations.                 COPD, TIA and PAD; Risk Factors:Hypertension and Diabetes.                 COVID-19 Positive.  Sonographer:    Jonelle Sidle Dance Referring Phys: [place]  1. Left ventricular ejection fraction, by estimation, is 55  to 60%. The left ventricle has normal function. The left ventricle has no regional wall motion abnormalities. Left ventricular diastolic parameters are consistent with Grade II diastolic dysfunction (pseudonormalization). Elevated left atrial pressure.  2. Right ventricular systolic function is normal. The right ventricular size is normal. Tricuspid regurgitation signal is inadequate for assessing PA pressure.  3. Left atrial size was moderately dilated.  4. Large pleural effusion in the left lateral region.  5. The mitral valve is normal in structure and  function. Mild mitral valve regurgitation.  6. The aortic valve is normal in structure and function. Aortic valve regurgitation is not visualized.  7. The inferior vena cava is dilated in size with >50% respiratory variability, suggesting right atrial pressure of 8 mmHg. Comparison(s): No prior Echocardiogram. FINDINGS  Left Ventricle: Left ventricular ejection fraction, by estimation, is 55 to 60%. The left ventricle has normal function. The left ventricle has no regional wall motion abnormalities. The left ventricular internal cavity size was normal in size. There is  no left ventricular hypertrophy. Left ventricular diastolic parameters are consistent with Grade II diastolic dysfunction (pseudonormalization). Elevated left atrial pressure. Right Ventricle: The right ventricular size is normal. No increase in right ventricular wall thickness. Right ventricular systolic function is normal. Tricuspid regurgitation signal is inadequate for assessing PA pressure. Left Atrium: Left atrial size was moderately dilated. Right Atrium: Right atrial size was normal in size. Pericardium: There is no evidence of pericardial effusion. Mitral Valve: The mitral valve is normal in structure and function. Mild mitral annular calcification. Mild mitral valve regurgitation. Tricuspid Valve: The tricuspid valve is normal in structure. Tricuspid valve regurgitation is not demonstrated. Aortic Valve: The aortic valve is normal in structure and function. Aortic valve regurgitation is not visualized. Pulmonic Valve: The pulmonic valve was grossly normal. Pulmonic valve regurgitation is not visualized. Aorta: The aortic root and ascending aorta are structurally normal, with no evidence of dilitation. Venous: The inferior vena cava is dilated in size with greater than 50% respiratory variability, suggesting right atrial pressure of 8 mmHg. IAS/Shunts: No atrial level shunt detected by color flow Doppler. Additional Comments: There is a  large pleural effusion in the left lateral region.  LEFT VENTRICLE PLAX 2D LVIDd:         3.79 cm  Diastology LVIDs:         2.87 cm  LV e' lateral:   6.00 cm/s LV PW:         1.27 cm  LV E/e' lateral: 18.3 LV IVS:        0.94 cm  LV e' medial:    3.57 cm/s LVOT diam:     1.80 cm  LV E/e' medial:  30.8 LV SV:         52 LV SV Index:   30 LVOT Area:     2.54 cm  RIGHT VENTRICLE            IVC RV Basal diam:  3.24 cm    IVC diam: 2.47 cm RV Mid diam:    1.67 cm RV S prime:     9.32 cm/s TAPSE (M-mode): 1.8 cm LEFT ATRIUM             Index       RIGHT ATRIUM           Index LA diam:        3.90 cm 2.24 cm/m  RA Area:     15.50 cm LA Vol (A2C):   79.2 ml  45.46 ml/m RA Volume:   42.40 ml  24.33 ml/m LA Vol (A4C):   61.2 ml 35.12 ml/m LA Biplane Vol: 73.7 ml 42.30 ml/m  AORTIC VALVE LVOT Vmax:   89.80 cm/s LVOT Vmean:  62.000 cm/s LVOT VTI:    0.204 m  AORTA Ao Root diam: 2.80 cm Ao Asc diam:  2.40 cm MITRAL VALVE MV Area (PHT): 3.72 cm     SHUNTS MV Decel Time: 204 msec     Systemic VTI:  0.20 m MV E velocity: 110.00 cm/s  Systemic Diam: 1.80 cm MV A velocity: 54.30 cm/s MV E/A ratio:  2.03 Mihai Croitoru MD Electronically signed by Sanda Klein MD Signature Date/Time: 10/23/2019/3:50:43 PM    Final    [place] EKG SITE RITE  Result Date: 10/22/2019 If Site Rite image not attached, placement could not be confirmed due to current cardiac rhythm.  US Abdomen Limited RUQ  Result Date: 10/22/2019 CLINICAL DATA:  Acute right upper quadrant abdominal pain. EXAM: ULTRASOUND ABDOMEN LIMITED RIGHT UPPER QUADRANT COMPARISON:  October 19, 2019. FINDINGS: Gallbladder: No gallstones or wall thickening visualized. No sonographic Murphy sign noted by sonographer. Common bile duct: Diameter: 5 mm which is within normal limits. Liver: No focal lesion identified. Within normal limits in parenchymal echogenicity. Portal vein is patent on color Doppler imaging with normal direction of blood flow towards the liver. Other: Minimal  ascites is noted. IMPRESSION: Minimal ascites. No other abnormality seen in the right upper quadrant of the abdomen. Electronically Signed   By: Marijo Conception M.D.   On: 10/22/2019 09:03

## 2019-10-29 NOTE — Progress Notes (Signed)
Inpatient Diabetes Program Recommendations  AACE/ADA: New Consensus Statement on Inpatient Glycemic Control (2015)  Target Ranges:  Prepandial:   less than 140 mg/dL      Peak postprandial:   less than 180 mg/dL (1-2 hours)      Critically ill patients:  140 - 180 mg/dL   Lab Results  Component Value Date   GLUCAP 203 (H) 10/29/2019   HGBA1C 6.9 (H) 10/23/2019    Review of Glycemic Control  Results for Jamie [name], Jamie [name] (MRN [id number]) as of 10/29/2019 08:58  Ref. Range 10/28/2019 07:45 10/28/2019 13:11 10/28/2019 16:42 10/28/2019 20:22 10/29/2019 07:58  Glucose-Capillary Latest Ref Range: 70 - 99 mg/dL 174 (H) 281 (H) 220 (H) 181 (H) 203 (H)   Diabetes history: DM2  Outpatient Diabetes medications: Novolog 10 units TID with meals + Basaglar 40 units QHS  Current orders for Inpatient glycemic control:  Novolog 0-15 units TID + Lantus 25 units daily   Inpatient Diabetes Program Recommendations:    Glucose trends still elevated after steroids discontinued on 10/25/19.  Pt takes more basal insulin at home.   -Pt may benefit from increasing Lantus to 30 units  Thank you, Tama Headings RN, MSN, BC-ADM Inpatient Diabetes Coordinator Team Pager [phone] (8a-5p)

## 2019-10-29 NOTE — Progress Notes (Signed)
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  She was recently hospitalized twice to [facility] for shortness of breath, her work-up showed bilateral transudative pleural effusion, she had thoracentesis suggesting transudative fluid with no malignant cells.  Reason for recurrent effusion was thought to be acute on chronic diastolic CHF.  She was also found to have liver mass.  She was finally transferred to [facility] for cardiology evaluation for recurrent CHF.  Also positive for Covid.     OT comments  Pt progressing towards OT goals gradually. Pt received in bed and agreeable to trial Stedy to assess/improve ability to achieve upright posture in preparation for ADL transfers. Pt min guard for bed mobility to sit EOB to increase safety due to pt heavily leaning forward when scooting. Pt Min A + 2 for sit to stand trial in [place], able to achieve full upright posture and maintain < 45 seconds. Pt then reported becoming suddenly fatigued, attempting to move Stedy pads from behind to sit on bed. Provided consistent safety education and assisted pt to recliner chair with Stedy. Pt declined to attempt standing with RW, requesting therapies to come back later. Pt setup for lunch. OT checked back with pt after lunch - however, pt declined to engage in therapy at that time. Pt on 2 L O2, VSS. Recommend SNF for rehab. Will continue to follow acutely.   Follow Up Recommendations  SNF;Supervision/Assistance - 24 hour    Equipment  Recommendations  Other (comment)    Recommendations for Other Services      Precautions / Restrictions Precautions Precautions: Fall Precaution Comments: R BKA April 2020 Required Braces or Orthoses: Other Brace Restrictions Weight Bearing Restrictions: No       Mobility Bed Mobility Overal bed mobility: Needs Assistance Bed Mobility: Supine to Sit     Supine to sit: Min guard     General bed mobility comments: min guard due to pt leaning forward unsafely at times when scooting  Transfers Overall transfer level: Needs assistance   Transfers: Sit to/from Stand Sit to Stand: Min assist;+2 safety/equipment         General transfer comment: Trialed Stedy to assess pt ability to stand fully upright. Pt MIn A +2 for sit to stand in [place], able to achieve full upright posture for < 45 seconds. Pt then reporting sudden fatigue and attempted to move Stedy pads and sit on bed. Transferred pt to recliner chair via [place] for lunch.     Balance Overall balance assessment: Needs assistance Sitting-balance support: No upper extremity supported;Feet supported Sitting balance-Leahy Scale: Good     Standing balance support: Bilateral upper extremity supported;During functional activity Standing balance-Leahy Scale: Poor                             ADL either performed or assessed with clinical judgement   ADL  Vision       Perception     Praxis      Cognition Arousal/Alertness: Awake/alert Behavior During Therapy: WFL for tasks assessed/performed Overall Cognitive Status: No family/caregiver present to determine baseline cognitive functioning                                 General Comments: Pt with difficulty problem solving and difficulty following safety education        Exercises     Shoulder Instructions       General Comments Pt on 2 L O2, VSS    Pertinent  Vitals/ Pain       Pain Assessment: No/denies pain  Home Living                                          Prior Functioning/Environment              Frequency  Min 2X/week        Progress Toward Goals  OT Goals(current goals can now be found in the care plan section)  Progress towards OT goals: Progressing toward goals  Acute Rehab OT Goals Patient Stated Goal: to go to Rehab and then home OT Goal Formulation: With patient Time For Goal Achievement: 11/06/19 Potential to Achieve Goals: Good ADL Goals Pt Will Perform Grooming: sitting;with modified independence Pt Will Perform Lower Body Bathing: with supervision;sitting/lateral leans;sit to/from stand Pt Will Perform Upper Body Dressing: with modified independence;sitting Pt Will Perform Lower Body Dressing: with supervision;sit to/from stand;sitting/lateral leans Pt Will Transfer to Toilet: with min guard assist;squat pivot transfer;bedside commode Pt Will Perform Toileting - Clothing Manipulation and hygiene: with min guard assist;sitting/lateral leans;sit to/from stand Pt/caregiver will Perform Home Exercise Program: Increased strength;Both right and left upper extremity;With written HEP provided;Independently  Plan Discharge plan remains appropriate    Co-evaluation    PT/OT/SLP Co-Evaluation/Treatment: Yes Reason for Co-Treatment: Complexity of the patient's impairments (multi-system involvement);To address functional/ADL transfers;For patient/therapist safety PT goals addressed during session: Mobility/safety with mobility;Balance;Proper use of DME OT goals addressed during session: ADL's and self-care;Other (comment)(ADL transfer)      AM-PAC OT "6 Clicks" Daily Activity     Outcome Measure   Help from another person eating meals?: None Help from another person taking care of personal grooming?: A Little Help from another person toileting, which includes using toliet, bedpan, or urinal?: A  Lot Help from another person bathing (including washing, rinsing, drying)?: A Lot Help from another person to put on and taking off regular upper body clothing?: A Little Help from another person to put on and taking off regular lower body clothing?: A Lot 6 Click Score: 16    End of Session Equipment Utilized During Treatment: Gait belt;Oxygen  OT Visit Diagnosis: Muscle weakness (generalized) (M62.81);Other abnormalities of gait and mobility (R26.89)   Activity Tolerance Patient limited by fatigue   Patient Left in chair;with call bell/phone within reach;with chair alarm set   Nurse Communication Mobility status;Need for lift equipment        Time: 1230-1255 OT Time Calculation (min): 25 min  Charges: OT General Charges $OT Visit: 1 Visit OT Treatments $Therapeutic Activity: 8-22 mins  [name], OTR/L   [name] 10/29/2019, 3:50 PM

## 2019-10-29 NOTE — Progress Notes (Signed)
   10/29/19 2000  Clinical Encounter Type  Visited With Patient  Visit Type Spiritual support;Psychological support  Referral From Nurse  Consult/Referral To Chaplain  Spiritual Encounters  Spiritual Needs Emotional;Prayer  Stress Factors  Patient Stress Factors Family relationships;Health changes  5W Nurse paged Chaplain for patient support.  Patient has been in and out of the hospital and rehab center for almost a year.  Patient is sad and feels likes her family is going on with out her and she is worried about when she can get home.  Patient has been married 40 years and she had 2 grandchildren.  Patient feels like she has no joy or hope and is tired.  Chaplain offered empathetic/reflective listening and words of comfort.  Chaplain prayed with patient at patient request.  Chaplain to have follow up tomorrow with day chaplain.  Visit was completed by way of telephone.  Toast, MDiv.

## 2019-10-30 LAB — COMPREHENSIVE METABOLIC PANEL
ALT: 19 U/L (ref 0–44)
AST: 19 U/L (ref 15–41)
Albumin: 2.5 g/dL — ABNORMAL LOW (ref 3.5–5.0)
Alkaline Phosphatase: 71 U/L (ref 38–126)
Anion gap: 9 (ref 5–15)
BUN: 22 mg/dL — ABNORMAL HIGH (ref 6–20)
CO2: 26 mmol/L (ref 22–32)
Calcium: 8.6 mg/dL — ABNORMAL LOW (ref 8.9–10.3)
Chloride: 99 mmol/L (ref 98–111)
Creatinine, Ser: 1.31 mg/dL — ABNORMAL HIGH (ref 0.44–1.00)
GFR calc Af Amer: 52 mL/min — ABNORMAL LOW (ref >60)
GFR calc non Af Amer: 44 mL/min — ABNORMAL LOW (ref >60)
Glucose, Bld: 276 mg/dL — ABNORMAL HIGH (ref 70–99)
Potassium: 3.4 mmol/L — ABNORMAL LOW (ref 3.5–5.1)
Sodium: 134 mmol/L — ABNORMAL LOW (ref 135–145)
Total Bilirubin: 0.3 mg/dL (ref 0.3–1.2)
Total Protein: 5.4 g/dL — ABNORMAL LOW (ref 6.5–8.1)

## 2019-10-30 LAB — C-REACTIVE PROTEIN: CRP: 0.6 mg/dL (ref <1.0)

## 2019-10-30 LAB — CBC WITH DIFFERENTIAL/PLATELET
Abs Immature Granulocytes: 0.13 10*3/uL — ABNORMAL HIGH (ref 0.00–0.07)
Basophils Absolute: 0 10*3/uL (ref 0.0–0.1)
Basophils Relative: 1 %
Eosinophils Absolute: 0.2 10*3/uL (ref 0.0–0.5)
Eosinophils Relative: 2 %
HCT: 26.7 % — ABNORMAL LOW (ref 36.0–46.0)
Hemoglobin: 8.4 g/dL — ABNORMAL LOW (ref 12.0–15.0)
Immature Granulocytes: 2 %
Lymphocytes Relative: 11 %
Lymphs Abs: 0.7 10*3/uL (ref 0.7–4.0)
MCH: 25.2 pg — ABNORMAL LOW (ref 26.0–34.0)
MCHC: 31.5 g/dL (ref 30.0–36.0)
MCV: 80.2 fL (ref 80.0–100.0)
Monocytes Absolute: 0.5 10*3/uL (ref 0.1–1.0)
Monocytes Relative: 8 %
Neutro Abs: 5.1 10*3/uL (ref 1.7–7.7)
Neutrophils Relative %: 76 %
Platelets: 222 10*3/uL (ref 150–400)
RBC: 3.33 MIL/uL — ABNORMAL LOW (ref 3.87–5.11)
RDW: 13.7 % (ref 11.5–15.5)
WBC: 6.6 10*3/uL (ref 4.0–10.5)
nRBC: 0 % (ref 0.0–0.2)

## 2019-10-30 LAB — GLUCOSE, CAPILLARY
Glucose-Capillary: 255 mg/dL — ABNORMAL HIGH (ref 70–99)
Glucose-Capillary: 268 mg/dL — ABNORMAL HIGH (ref 70–99)
Glucose-Capillary: 276 mg/dL — ABNORMAL HIGH (ref 70–99)
Glucose-Capillary: 270 mg/dL — ABNORMAL HIGH (ref 70–99)
Glucose-Capillary: 234 mg/dL — ABNORMAL HIGH (ref 70–99)

## 2019-10-30 LAB — D-DIMER, QUANTITATIVE: D-Dimer, Quant: 3.82 ug/mL-FEU — ABNORMAL HIGH (ref 0.00–0.50)

## 2019-10-30 LAB — PROCALCITONIN: Procalcitonin: 0.27 ng/mL

## 2019-10-30 LAB — BRAIN NATRIURETIC PEPTIDE: B Natriuretic Peptide: 361.3 pg/mL — ABNORMAL HIGH (ref 0.0–100.0)

## 2019-10-30 LAB — MAGNESIUM: Magnesium: 1.6 mg/dL — ABNORMAL LOW (ref 1.7–2.4)

## 2019-10-30 MED ORDER — SODIUM CHLORIDE 0.9% FLUSH
3.0000 mL | Freq: Two times a day (BID) | INTRAVENOUS | Status: DC
  Administered 2019-10-30 – 2019-10-31 (×2): 3 mL via INTRAVENOUS

## 2019-10-30 MED ORDER — MAGNESIUM SULFATE 2 GM/50ML IV SOLN
2.0000 g | Freq: Once | INTRAVENOUS | Status: AC
  Administered 2019-10-30: 2 g via INTRAVENOUS
  Filled 2019-10-30: qty 50

## 2019-10-30 MED ORDER — POTASSIUM CHLORIDE 20 MEQ PO PACK
20.0000 meq | PACK | Freq: Once | ORAL | Status: AC
  Administered 2019-10-30: 20 meq via ORAL
  Filled 2019-10-30: qty 1

## 2019-10-30 MED ORDER — POTASSIUM CHLORIDE 20 MEQ PO PACK
40.0000 meq | PACK | Freq: Once | ORAL | Status: AC
  Administered 2019-10-30: 40 meq via ORAL
  Filled 2019-10-30: qty 2

## 2019-10-30 NOTE — Progress Notes (Signed)
PROGRESS NOTE                                                                                                                                                                                                             Patient Demographics:    Jamie [name], is a 59 y.o. 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    She was recently hospitalized twice to [facility] for shortness of breath, her work-up showed bilateral transudative pleural effusion, she had thoracentesis suggesting transudative fluid with no malignant cells.  Reason for recurrent effusion was thought to be acute on chronic diastolic CHF.  She was also found to have liver mass.  She was finally transferred to [facility] for cardiology evaluation for recurrent CHF.   Subjective:   Patient in bed, appears comfortable, denies any headache, no fever, no chest pain or pressure, no shortness of breath , no abdominal pain. No focal weakness.    Assessment  & Plan :    1.  Severe Acute hypoxic respiratory failure due to recurrent Acute on chronic diastolic CHF EF 28% .  Recurrent bilateral pleural effusions which are transudate if worked up in [place], EF 60%.    He had massive pulmonary edema with pleural effusion at the time of presentation and almost got intubated, was aggressively diuresed and monitored in ICU for a day, now breathing much improved.  She also  underwent ultrasound-guided thoracentesis on 3/3 and 3/4 respectively with 750 cc of transudative fluid removed from right side and 1300 cc of fluid removed from the left side.  Again this appears transudative with mesothelial cells and cytology.  Patient has had 2 other bouts of thoracentesis at [facility] earlier this month.  Patient currently stable on diuretics and with ongoing diuresis she is stable on baseline  2 L nasal cannula oxygen with pulse ox above 95%, she is in no distress, chest x-ray seems to have mild pleural effusion and not getting worse. She was also seen by pulmonary and cardiology this admission.  And is to continue diuresis with present dose diuretics, repeat chest x-ray on 10/30/2019, if pleural effusion is not worse and she is clinically stable discharge to SNF, if worse recall pulmonary.  Case discussed with pulmonary physician Dr. Valeta Harms on 10/29/2019 who has arranged for outpatient follow-up in case repeat chest x-ray stable on 10/31/2019, if at any point pleural effusions are reaccumulating she will receive Pleurx catheter.  For now continue diuresis, clinically stable, repeat chest x-ray 10/31/2019, if stable discharge to SNF if worse recall pulmonary.   2. Acute Covid 19 Viral Pneumonitis during the ongoing 2020 Covid 19 Pandemic - her main disease process and hypoxia is due to #1 above which is acute on chronic diastolic CHF.  Her Covid disease itself seems to be mild to moderate for which he has completed her treatment with remdesivir and steroids.  Encouraged the patient to sit up in chair in the daytime use I-S and flutter valve for pulmonary toiletry and then prone in bed when at night.  SpO2: 100 % O2 Flow Rate (L/min): 2 L/min FiO2 (%): 40 %  Recent Labs  Lab 10/26/19 0313 10/27/19 0418 10/28/19 0330 10/29/19 0309 10/30/19 0426 10/30/19 0427  CRP 0.8 0.6 0.8 0.6 0.6  --   DDIMER 3.02* 3.02* 3.47* 3.82* 3.82*  --   BNP 864.8* 822.5* 770.4* 334.7*  --   361.3*  PROCALCITON 0.32 0.31 0.22 0.21 0.27  --     Hepatic Function Latest Ref Rng & Units 10/30/2019 10/29/2019 10/28/2019  Total Protein 6.5 - 8.1 g/dL 5.4(L) 5.0(L) 5.1(L)  Albumin 3.5 - 5.0 g/dL 2.5(L) 2.3(L) 2.2(L)  AST 15 - 41 U/L 19 19 18   ALT 0 - 44 U/L 19 18 17   Alk Phosphatase 38 - 126 U/L 71 67 52  Total Bilirubin 0.3 - 1.2 mg/dL 0.3 0.3 0.4      3.  PAD.  S/p right BKA, CT evidence of abdominal diffuse calcification with recurrent abdominal pain.  Highly questionable for ischemic bowel, for now continue aspirin and statin for secondary prevention, clear liquid diet and monitor.  She likely has diffuse atherosclerosis due to uncontrolled diabetes mellitus and hypertension.  She denies any history of smoking, with supportive care she is somewhat better and tolerating liquid diet.  4.  Hypertensive crisis. Stopped  IV Clevidipine drip, oral medications adjusted and as needed IV hydralazine added.  Currently on combination of beta-blocker, Norvasc, clonidine, hydralazine along with Imdur.   5.  GERD with nausea.  Also has HX of diabetic gastroparesis.  For now supportive care with clear liquid diet, PPI and nausea medications.   6.  Dyslipidemia.  Continue statin.  7.  Anemia of chronic disease.  Monitor.  8.  Hypertensive urgency.  Resolved.  9.  Depression and anxiety.  Continue combination of bupropion, fluoxetine, buspirone and Xanax.  10.  AKI at [facility] with creatinine of 1.5.  Renal function worse likely due to combination of IV contrast from CT and diuresis, Lasix was held and she was gently hydrated for 2 days from 10/25/2019 to 10/26/2019 with total of 2 L of normal saline, creatinine has stabilized and now trending down, will commence low-dose Lasix on 10/27/2019 as she is developing crackles and evidence of reoccurrence of pleural effusion.  11. Liver mass  suspicious on CT scan.  No mass noted on MRI.  12.  Hypomagnesemia and hypokalemia.  Replaced.   13.  DM2 - placed on  Lantus sliding scale along with Premeal NovoLog.  Monitor and adjust  Lab Results  Component Value Date   HGBA1C 6.9 (H) 10/23/2019     CBG (last 3)  Recent Labs    10/29/19 1942 10/30/19 0415 10/30/19 0752  GLUCAP 171* 255* 268*     Condition - Extremely Guarded  Family Communication  :  Daughter in law RN on 10/22/19 @ [phone], called on 10/23/2019 at 10:20 AM - full mailbox.  Code Status :  Full  Diet :   Diet Order            Diet Heart Room service appropriate? Yes; Fluid consistency: Thin; Fluid restriction: 1200 mL Fluid  Diet effective now               Disposition Plan  : In the hospital, still getting high-dose diuretics for CHF.  Pleural effusion recurrent, if reoccurs and continues to get worse will require bilateral Pleurx catheter placed this admission.  If stable eventual discharge to SNF, bed has been arranged.  Consults  : Pulmonary, cardiology  Procedures  :    MRI abdomen.  No suspicious liver lesion.    Ultrasound-guided right-sided thoracentesis on 10/24/2019 with 750 cc of fluid removed.  Done by pulmonary.  Ultrasound guided left-sided thoracentesis on 10/25/2019 with 1300 cc of fluid removed.  Done by pulmonary  Renal ultrasound.  CT - 1. Persistent moderate to large bilateral pleural effusions with overlying atelectasis. 2. No acute abdominal/pelvic findings, mass lesions or adenopathy. 3. Severe/advanced atherosclerotic calcifications involving the aorta and branch vessels for age. 4. Small to moderate amount of persistent free pelvic fluid. No obvious cause. 5. Calcified uterine fibroids. Aortic Atherosclerosis.  TT[facility] - EF 60%, dCHF  PICC line and Foley  requested.  PUD Prophylaxis : PPI  DVT Prophylaxis  :  Lovenox    Lab Results  Component Value Date   PLT 222 10/30/2019    Inpatient Medications  Scheduled Meds: . amLODipine  10 mg Oral Daily  . aspirin EC  81 mg Oral Daily  . atorvastatin  80 mg Oral QHS  .  busPIRone  5 mg Oral Daily  . Chlorhexidine Gluconate Cloth  6 each Topical Daily  . clonazepam  0.25 mg Oral BID  . cloNIDine  0.3 mg Oral BID  . enoxaparin (LOVENOX) injection  40 mg Subcutaneous Q24H  . FLUoxetine  40 mg Oral Daily  . folic acid  1 mg Oral Daily  . furosemide  60 mg Oral BID  . hydrALAZINE  100 mg Oral Q8H  . insulin aspart  0-15 Units Subcutaneous TID WC  . insulin aspart  0-5 Units Subcutaneous QHS  . insulin glargine  30 Units Subcutaneous QHS  . isosorbide mononitrate  60 mg Oral Daily  . labetalol  300 mg Oral BID  . multivitamin with minerals  1 tablet Oral Daily  . pantoprazole  40 mg Oral Daily  . sodium chloride flush  3 mL Intravenous Q12H  . spironolactone  25 mg Oral Daily  . thiamine  100 mg Oral Daily   Continuous Infusions:  PRN Meds:.acetaminophen, albuterol, ALPRAZolam, hydrALAZINE, HYDROcodone-acetaminophen, ipratropium-albuterol, morphine injection, nitroGLYCERIN, ondansetron (ZOFRAN) IV, sodium chloride flush  Antibiotics  :    Anti-infectives (From admission, onward)   Start     Dose/Rate Route Frequency Ordered  Stop   10/23/19 1000  remdesivir 100 mg in sodium chloride 0.9 % 100 mL IVPB     100 mg 200 mL/hr over 30 Minutes Intravenous Daily 10/22/19 1713 10/26/19 1006   10/22/19 1800  remdesivir 200 mg in sodium chloride 0.9% 250 mL IVPB     200 mg 580 mL/hr over 30 Minutes Intravenous Once 10/22/19 1713 10/22/19 1843   10/21/19 2100  cefTRIAXone (ROCEPHIN) 1 g in sodium chloride 0.9 % 100 mL IVPB  Status:  Discontinued     1 g 200 mL/hr over 30 Minutes Intravenous Every 24 hours 10/21/19 1744 10/22/19 0748   10/21/19 2100  azithromycin (ZITHROMAX) tablet 500 mg  Status:  Discontinued     500 mg Oral Daily 10/21/19 1744 10/22/19 0748       Time Spent in minutes  30   Lala Lund M.D on 10/30/2019 at 11:04 AM  To page go to [email or web address] - password [facility]  [facility] -  Office  [phone]     See all Orders from  today for further details    Objective:   Vitals:   10/30/19 0615 10/30/19 0753 10/30/19 0926 10/30/19 0927  BP: (!) 178/62 (!) 178/68 (!) 162/62   Pulse: 78 80  80  Resp: 15 15    Temp: 98.5 F (36.9 C) 98.8 F (37.1 C)    TempSrc: Oral Oral    SpO2: 100% 100%    Weight:      Height:        Wt Readings from Last 3 Encounters:  10/29/19 68.3 kg     Intake/Output Summary (Last 24 hours) at 10/30/2019 1104 Last data filed at 10/29/2019 1700 Gross per 24 hour  Intake 444 ml  Output 550 ml  Net -106 ml     Physical Exam  Awake Alert, No new F.N deficits, Normal affect [place].AT,PERRAL Supple Neck,No JVD, No cervical lymphadenopathy appriciated.  Symmetrical Chest wall movement, diminished bibasilar breath sounds with some crackles RRR,No Gallops, Rubs or new Murmurs, No Parasternal Heave +ve B.Sounds, Abd Soft, No tenderness, No organomegaly appriciated, No rebound - guarding or rigidity. No Cyanosis,  right BKA     Data Review:    CBC Recent Labs  Lab 10/26/19 0313 10/27/19 0418 10/28/19 0330 10/29/19 0309 10/30/19 0426  WBC 5.6 5.6 5.5 6.1 6.6  HGB 8.0* 8.5* 9.1* 8.4* 8.4*  HCT 26.4* 27.1* 28.9* 27.4* 26.7*  PLT 201 198 199 203 222  MCV 81.2 80.7 79.8* 80.4 80.2  MCH 24.6* 25.3* 25.1* 24.6* 25.2*  MCHC 30.3 31.4 31.5 30.7 31.5  RDW 13.6 13.6 13.6 13.5 13.7  LYMPHSABS 0.3* 0.7 0.7 0.6* 0.7  MONOABS 0.2 0.5 0.5 0.5 0.5  EOSABS 0.0 0.1 0.1 0.1 0.2  BASOSABS 0.0 0.0 0.0 0.0 0.0    Chemistries  Recent Labs  Lab 10/26/19 0313 10/27/19 0418 10/28/19 0330 10/29/19 0309 10/30/19 0426  NA 133* 136 136 134* 134*  K 4.8 4.3 3.8 3.6 3.4*  CL 98 101 102 99 99  CO2 24 27 26 26 26   GLUCOSE 296* 194* 201* 262* 276*  BUN 66* 58* 40* 35* 22*  CREATININE 2.91* 2.35* 1.56* 1.82* 1.31*  CALCIUM 8.3* 8.5* 8.5* 8.5* 8.6*  MG 1.9 1.8 1.5* 1.9 1.6*  AST 15 17 18 19 19   ALT 16 17 17 18 19   ALKPHOS 52 55 52 67 71  BILITOT 0.4 0.4 0.4 0.3 0.3    ------------------------------------------------------------------------------------------------------------------ No results for input(s): CHOL, HDL, LDLCALC, TRIG, CHOLHDL,  LDLDIRECT in the last 72 hours.  Lab Results  Component Value Date   HGBA1C 6.9 (H) 10/23/2019   ------------------------------------------------------------------------------------------------------------------ No results for input(s): TSH, T4TOTAL, T3FREE, THYROIDAB in the last 72 hours.  Invalid input(s): FREET3  Cardiac Enzymes No results for input(s): CKMB, TROPONINI, MYOGLOBIN in the last 168 hours.  Invalid input(s): CK ------------------------------------------------------------------------------------------------------------------    Component Value Date/Time   BNP 361.3 (H) 10/30/2019 0427    Micro Results Recent Results (from the past 240 hour(s))  SARS CORONAVIRUS 2 (TAT 6-24 HRS) Nasopharyngeal Nasopharyngeal Swab     Status: Abnormal   Collection Time: 10/21/19  6:39 PM   Specimen: Nasopharyngeal Swab  Result Value Ref Range Status   SARS Coronavirus 2 POSITIVE (A) NEGATIVE Final    Comment: RESULT CALLED TO, READ BACK BY AND VERIFIED WITH: K. AJRDY,RN 0017 10/22/2019 T. TYSOR (NOTE) SARS-CoV-2 target nucleic acids are DETECTED. The SARS-CoV-2 RNA is generally detectable in upper and lower respiratory specimens during the acute phase of infection. Positive results are indicative of the presence of SARS-CoV-2 RNA. Clinical correlation with patient history and other diagnostic information is  necessary to determine patient infection status. Positive results do not rule out bacterial infection or co-infection with other viruses.  The expected result is Negative. Fact Sheet for Patients: SugarRoll.be Fact Sheet for Healthcare Providers: https://www.woods-mathews.com/ This test is not yet approved or cleared by the [place] FDA and  has been  authorized for detection and/or diagnosis of SARS-CoV-2 by FDA under an Emergency Use Authorization (EUA). This EUA will remain  in effect (meaning this test can be used) for th e duration of the COVID-19 declaration under Section 564(b)(1) of the Act, 21 U.S.C. section 360bbb-3(b)(1), unless the authorization is terminated or revoked sooner. Performed at [facility], [place] [address], [place], [place] [address]   MRSA PCR Screening     Status: None   Collection Time: 10/22/19  2:51 PM   Specimen: Nasal Mucosa; Nasopharyngeal  Result Value Ref Range Status   MRSA by PCR NEGATIVE NEGATIVE Final    Comment:        The GeneXpert MRSA Assay (FDA approved for NASAL specimens only), is one component of a comprehensive MRSA colonization surveillance program. It is not intended to diagnose MRSA infection nor to guide or monitor treatment for MRSA infections. Performed at [facility], [place] [address] [place], [place] [address]   Stat Gram stain     Status: None   Collection Time: 10/24/19 11:14 AM   Specimen: Pleura; Body Fluid  Result Value Ref Range Status   Specimen Description PLEURAL RIGHT  Final   Special Requests NONE  Final   Gram Stain   Final    RARE WBC PRESENT, PREDOMINANTLY MONONUCLEAR NO ORGANISMS SEEN Performed at [facility], [address], [place], [place] [address]    Report Status 10/24/2019 FINAL  Final  Respiratory Panel by PCR     Status: None   Collection Time: 10/25/19  4:18 AM   Specimen: Nasopharyngeal Swab; Respiratory  Result Value Ref Range Status   Adenovirus NOT DETECTED NOT DETECTED Final   Coronavirus 229E NOT DETECTED NOT DETECTED Final    Comment: (NOTE) The Coronavirus on the Respiratory Panel, DOES NOT test for the novel  Coronavirus (2019 nCoV)    Coronavirus HKU1 NOT DETECTED NOT DETECTED Final   Coronavirus NL63 NOT DETECTED NOT DETECTED Final   Coronavirus OC43 NOT DETECTED NOT DETECTED Final   Metapneumovirus NOT  DETECTED NOT DETECTED Final   Rhinovirus / Enterovirus NOT DETECTED NOT DETECTED Final   Influenza A NOT DETECTED NOT DETECTED Final   Influenza B NOT DETECTED NOT DETECTED Final   Parainfluenza Virus 1 NOT DETECTED NOT DETECTED Final   Parainfluenza Virus 2 NOT DETECTED NOT DETECTED Final   Parainfluenza Virus 3 NOT DETECTED NOT DETECTED Final   Parainfluenza Virus 4 NOT DETECTED NOT DETECTED Final   Respiratory Syncytial Virus NOT DETECTED NOT DETECTED Final   Bordetella pertussis NOT DETECTED NOT DETECTED Final   Chlamydophila pneumoniae NOT DETECTED NOT DETECTED Final   Mycoplasma pneumoniae NOT DETECTED NOT DETECTED Final    Comment: Performed at [facility], [place] [address], [place], [place] [address]  Body fluid culture (includes gram stain)     Status: None   Collection Time: 10/25/19  4:29 PM   Specimen: Pleural Fluid  Result Value Ref Range Status   Specimen Description FLUID PLEURAL  Final   Special Requests NONE  Final   Gram Stain   Final    FEW WBC PRESENT, PREDOMINANTLY MONONUCLEAR NO ORGANISMS SEEN    Culture   Final    NO GROWTH 3 DAYS Performed at [facility], [place] [address], [place], [place] [address]    Report Status 10/28/2019 FINAL  Final    Radiology Reports MR ABDOMEN WO CONTRAST  Result Date: 10/28/2019 CLINICAL DATA:  59 year old female with history of liver lesion. Follow-up study. EXAM: MRI ABDOMEN WITHOUT CONTRAST TECHNIQUE: Multiplanar multisequence MR imaging was performed without the administration of intravenous contrast. COMPARISON:  No prior abdominal MRI. CT the abdomen and pelvis 10/22/2019. FINDINGS: Comment: Today's study is limited by lack of IV gadolinium for detection and characterization of visceral and/or vascular lesions. Lower chest: Large right and moderate left pleural effusions. Signal in the visualize lung bases dependently, nonspecific, but favored to reflect areas of passive atelectasis. Hepatobiliary: No definite  suspicious cystic or solid hepatic lesions are confidently identified on today's noncontrast examination. No intra or extrahepatic biliary ductal dilatation. Gallbladder is normal in appearance. Pancreas: No definite pancreatic mass or peripancreatic fluid collections or inflammatory changes noted on today's noncontrast CT examination. Spleen:  Unremarkable. Adrenals/Urinary Tract: Unenhanced appearance of the kidneys and bilateral adrenal glands are normal. Stomach/Bowel: Visualized portions are unremarkable. Vascular/Lymphatic: No aneurysm identified in the visualized abdominal vasculature. No lymphadenopathy noted in the abdomen. Other: No significant volume of ascites noted in the visualized portions of the peritoneal cavity. Musculoskeletal: No aggressive appearing osseous lesions are noted in the visualized portions of the skeleton. Mild diffuse body wall edema. IMPRESSION: 1. The lesion of concern on the prior CT scan is not demonstrated on today's noncontrast MRI examination. This may suggest a benign perfusion anomaly on the prior CT scan. Regardless, this lesion is unlikely to be related to the patient's history of abdominal pain. If there is persistent clinical concern, this lesion could be further evaluated with repeat abdominal MRI with and without IV gadolinium. 2. Large right and moderate left pleural effusions with areas of probable passive atelectasis in the dependent portions of the lung bases bilaterally. 3. Mild diffuse body wall edema. Electronically Signed   By: Vinnie Langton M.D.   On: 10/28/2019 08:17   CT ABDOMEN PELVIS W CONTRAST  Result Date: 10/22/2019 CLINICAL DATA:  Diffuse abdominal pain and anemia. EXAM: CT ABDOMEN AND PELVIS WITH CONTRAST TECHNIQUE: Multidetector CT imaging of the abdomen and pelvis was performed using the standard protocol following bolus administration of  intravenous contrast. CONTRAST:  7m OMNIPAQUE IOHEXOL 300 MG/ML  SOLN COMPARISON:  CT scan 10/19/2019  FINDINGS: Lower chest: Persistent moderate to large bilateral pleural effusions with significant overlying atelectasis. The heart is normal in size. No pericardial effusion. Age advanced coronary artery calcifications are noted. Hepatobiliary: No focal hepatic lesions or intrahepatic biliary dilatation. Ill-defined area of contrast enhancement and segment 7 likely a vascular shunt. The gallbladder is mildly contracted. No common bile duct dilatation. Pancreas: Scattered calcifications or likely due to prior inflammation. No worrisome pancreatic lesions or acute inflammatory process. No ductal dilatation. Spleen: Normal size. No focal lesions. Adrenals/Urinary Tract: The adrenal glands and kidneys are unremarkable. No worrisome renal lesions or hydronephrosis. The bladder appears normal. Stomach/Bowel: The stomach, duodenum, small bowel and colon are grossly normal without oral contrast. No acute inflammatory changes, mass lesions or obstructive findings. Vascular/Lymphatic: Severe/advanced atherosclerotic calcifications involving the aorta and branch vessels for age. No aneurysm or dissection. The major venous structures are patent. No mesenteric or retroperitoneal mass or adenopathy. Reproductive: The uterus and ovaries are unremarkable. Calcified fibroid again noted posteriorly. Other: Small to moderate amount of free pelvic fluid is again demonstrated. No obvious cause. Musculoskeletal: Stable compression fracture of L1. No worrisome bone lesions. Age advanced osteoporosis. IMPRESSION: 1. Persistent moderate to large bilateral pleural effusions with overlying atelectasis. 2. No acute abdominal/pelvic findings, mass lesions or adenopathy. 3. Severe/advanced atherosclerotic calcifications involving the aorta and branch vessels for age. 4. Small to moderate amount of persistent free pelvic fluid. No obvious cause. 5. Calcified uterine fibroids. Aortic Atherosclerosis (ICD10-I70.0). Electronically Signed   By: PMarijo SanesM.D.   On: 10/22/2019 13:56   [place]NAL  Result Date: 10/25/2019 CLINICAL DATA:  Acute renal failure EXAM: RENAL / URINARY TRACT ULTRASOUND COMPLETE COMPARISON:  None. FINDINGS: Right Kidney: Renal measurements: 13.5 x 5.7 x 4.7 cm. = volume: 188 mL . Echogenicity within normal limits. No mass or hydronephrosis visualized. Left Kidney: Renal measurements: 14.2 x 4.9 x 4.3 cm = volume: 157 mL. Echogenicity within normal limits. No mass or hydronephrosis visualized. Bladder: Decompressed by Foley catheter. Other: None. IMPRESSION: No acute abnormality noted. Electronically Signed   By: MInez CatalinaM.D.   On: 10/25/2019 22:11   DG Chest Port 1 View  Result Date: 10/28/2019 CLINICAL DATA:  Shortness of breath, COVID positive EXAM: PORTABLE CHEST 1 VIEW COMPARISON:  10/26/2019 FINDINGS: Increased density at the lung bases. No pneumothorax. Stable cardiomediastinal contours. Right PICC line is again noted. IMPRESSION: Increased density at the lung bases probably reflecting a combination of pleural effusion and atelectasis. Electronically Signed   By: PMacy MisM.D.   On: 10/28/2019 07:50   DG Chest Port 1 View  Result Date: 10/26/2019 CLINICAL DATA:  Shortness of breath, COVID positive EXAM: PORTABLE CHEST 1 VIEW COMPARISON:  10/25/2019 FINDINGS: Right-sided PICC line terminates in the distal superior vena cava. Heart size is stable and normal accounting for projection. Increasing opacity at the left lung base without silhouetting of left hemidiaphragm. Subtle opacities are seen on a background of mild interstitial prominence worse in the left lung base, also present in right mid chest and lower chest. Visualized skeletal structures are unremarkable. IMPRESSION: 1. Increasing opacity at the left lung base without silhouetting of the left hemidiaphragm. Findings may represent developing effusion. 2. Subtle opacities in the right mid chest and lower chest. Findings could be seen in the setting of  COVID-19 pneumonia. Continued follow-up suggested. Electronically Signed   By: GJewel BaizeD.  On: 10/26/2019 08:59   DG Chest Port 1 View  Result Date: 10/25/2019 CLINICAL DATA:  Status post thoracentesis. EXAM: PORTABLE CHEST 1 VIEW COMPARISON:  Radiograph earlier this day. Lung bases from abdominal CT 10/22/2019 FINDINGS: Decreased left pleural effusion. No visualized pneumothorax. Hazy opacity at the right lung base likely small right pleural effusion and atelectasis. Right upper extremity PICC remains in place. Borderline cardiomegaly with unchanged mediastinal contours. Improved pulmonary edema from earlier this day. Bones are under mineralized. IMPRESSION: 1. Decreased left pleural effusion after thoracentesis. No visualized pneumothorax. 2. Hazy opacity at the right lung base likely small right pleural effusion and atelectasis. 3. Improving pulmonary edema. Electronically Signed   By: Keith Rake M.D.   On: 10/25/2019 17:17   DG Chest Port 1 View  Result Date: 10/25/2019 CLINICAL DATA:  Status post thoracentesis. COVID positive. EXAM: PORTABLE CHEST 1 VIEW COMPARISON:  One-view chest x-ray 10/23/19 FINDINGS: Heart size is normal. Right-sided PICC line terminates in the distal SVC. Left pleural effusion and airspace disease is improved. No pneumothorax is present. Mild edema is present. IMPRESSION: 1. Improving left pleural effusion and airspace disease. 2. No pneumothorax. 3. Mild edema. Electronically Signed   By: San Morelle M.D.   On: 10/25/2019 06:57   DG CHEST PORT 1 VIEW  Result Date: 10/23/2019 CLINICAL DATA:  Acute respiratory insufficiency EXAM: PORTABLE CHEST 1 VIEW COMPARISON:  October 22, 2019 FINDINGS: The heart size and mediastinal contours are unchanged with mild cardiomegaly. There is a small left pleural effusion. There is slight interval improvement in the hazy/interstitial markings throughout both lungs. No acute osseous abnormality. IMPRESSION: Slight interval  improvement in the bilateral hazy airspace opacities which could be due to pulmonary edema or infectious etiology. Small left pleural effusion. Electronically Signed   By: Prudencio Pair M.D.   On: 10/23/2019 06:32   DG Chest Port 1 View  Result Date: 10/22/2019 CLINICAL DATA:  CHF respiratory distress.  COVID-19 positive EXAM: PORTABLE CHEST 1 VIEW COMPARISON:  10/20/2019 FINDINGS: Diffuse bilateral airspace disease with basilar predominance shows mild progression. Moderate left effusion has improved in the interval. Probable small right effusion. IMPRESSION: Mild progression of diffuse bilateral airspace disease which may represent edema or pneumonia. Improvement in left pleural effusion. Electronically Signed   By: Franchot Gallo M.D.   On: 10/22/2019 09:28   ECHOCARDIOGRAM COMPLETE  Result Date: 10/23/2019    ECHOCARDIOGRAM REPORT   Patient Name:   Adalay Ingraham  Date of Exam: 10/23/2019 Medical Rec #:  [id number]  Height:       63.0 in Accession #:    [id number] Weight:       156.5 lb Date of Birth:  30-Dec-1960 BSA:          1.742 m Patient Age:    42 years   BP:           124/65 mmHg Patient Gender: F          HR:           66 bpm. Exam Location:  Inpatient Procedure: 2D Echo, Cardiac Doppler and Color Doppler Indications:    I50.33 Acute on chronic diastolic (congestive) heart failure  History:        Patient has no prior history of Echocardiogram examinations.                 COPD, TIA and PAD; Risk Factors:Hypertension and Diabetes.  COVID-19 Positive.  Sonographer:    Jonelle Sidle Dance Referring Phys: [place]  1. Left ventricular ejection fraction, by estimation, is 55 to 60%. The left ventricle has normal function. The left ventricle has no regional wall motion abnormalities. Left ventricular diastolic parameters are consistent with Grade II diastolic dysfunction (pseudonormalization). Elevated left atrial pressure.  2. Right ventricular systolic function is normal. The  right ventricular size is normal. Tricuspid regurgitation signal is inadequate for assessing PA pressure.  3. Left atrial size was moderately dilated.  4. Large pleural effusion in the left lateral region.  5. The mitral valve is normal in structure and function. Mild mitral valve regurgitation.  6. The aortic valve is normal in structure and function. Aortic valve regurgitation is not visualized.  7. The inferior vena cava is dilated in size with >50% respiratory variability, suggesting right atrial pressure of 8 mmHg. Comparison(s): No prior Echocardiogram. FINDINGS  Left Ventricle: Left ventricular ejection fraction, by estimation, is 55 to 60%. The left ventricle has normal function. The left ventricle has no regional wall motion abnormalities. The left ventricular internal cavity size was normal in size. There is  no left ventricular hypertrophy. Left ventricular diastolic parameters are consistent with Grade II diastolic dysfunction (pseudonormalization). Elevated left atrial pressure. Right Ventricle: The right ventricular size is normal. No increase in right ventricular wall thickness. Right ventricular systolic function is normal. Tricuspid regurgitation signal is inadequate for assessing PA pressure. Left Atrium: Left atrial size was moderately dilated. Right Atrium: Right atrial size was normal in size. Pericardium: There is no evidence of pericardial effusion. Mitral Valve: The mitral valve is normal in structure and function. Mild mitral annular calcification. Mild mitral valve regurgitation. Tricuspid Valve: The tricuspid valve is normal in structure. Tricuspid valve regurgitation is not demonstrated. Aortic Valve: The aortic valve is normal in structure and function. Aortic valve regurgitation is not visualized. Pulmonic Valve: The pulmonic valve was grossly normal. Pulmonic valve regurgitation is not visualized. Aorta: The aortic root and ascending aorta are structurally normal, with no evidence of  dilitation. Venous: The inferior vena cava is dilated in size with greater than 50% respiratory variability, suggesting right atrial pressure of 8 mmHg. IAS/Shunts: No atrial level shunt detected by color flow Doppler. Additional Comments: There is a large pleural effusion in the left lateral region.  LEFT VENTRICLE PLAX 2D LVIDd:         3.79 cm  Diastology LVIDs:         2.87 cm  LV e' lateral:   6.00 cm/s LV PW:         1.27 cm  LV E/e' lateral: 18.3 LV IVS:        0.94 cm  LV e' medial:    3.57 cm/s LVOT diam:     1.80 cm  LV E/e' medial:  30.8 LV SV:         52 LV SV Index:   30 LVOT Area:     2.54 cm  RIGHT VENTRICLE            IVC RV Basal diam:  3.24 cm    IVC diam: 2.47 cm RV Mid diam:    1.67 cm RV S prime:     9.32 cm/s TAPSE (M-mode): 1.8 cm LEFT ATRIUM             Index       RIGHT ATRIUM           Index LA diam:  3.90 cm 2.24 cm/m  RA Area:     15.50 cm LA Vol (A2C):   79.2 ml 45.46 ml/m RA Volume:   42.40 ml  24.33 ml/m LA Vol (A4C):   61.2 ml 35.12 ml/m LA Biplane Vol: 73.7 ml 42.30 ml/m  AORTIC VALVE LVOT Vmax:   89.80 cm/s LVOT Vmean:  62.000 cm/s LVOT VTI:    0.204 m  AORTA Ao Root diam: 2.80 cm Ao Asc diam:  2.40 cm MITRAL VALVE MV Area (PHT): 3.72 cm     SHUNTS MV Decel Time: 204 msec     Systemic VTI:  0.20 m MV E velocity: 110.00 cm/s  Systemic Diam: 1.80 cm MV A velocity: 54.30 cm/s MV E/A ratio:  2.03 Mihai Croitoru MD Electronically signed by Sanda Klein MD Signature Date/Time: 10/23/2019/3:50:43 PM    Final    [place] EKG SITE RITE  Result Date: 10/22/2019 If Site Rite image not attached, placement could not be confirmed due to current cardiac rhythm.  US Abdomen Limited RUQ  Result Date: 10/22/2019 CLINICAL DATA:  Acute right upper quadrant abdominal pain. EXAM: ULTRASOUND ABDOMEN LIMITED RIGHT UPPER QUADRANT COMPARISON:  October 19, 2019. FINDINGS: Gallbladder: No gallstones or wall thickening visualized. No sonographic Murphy sign noted by sonographer. Common bile duct:  Diameter: 5 mm which is within normal limits. Liver: No focal lesion identified. Within normal limits in parenchymal echogenicity. Portal vein is patent on color Doppler imaging with normal direction of blood flow towards the liver. Other: Minimal ascites is noted. IMPRESSION: Minimal ascites. No other abnormality seen in the right upper quadrant of the abdomen. Electronically Signed   By: Marijo Conception M.D.   On: 10/22/2019 09:03

## 2019-10-30 NOTE — H&P (View-Only) (Signed)
Patient ID: Jamie [name], female   DOB: November 15, 1960, 59 y.o.   MRN: [id number]     Advanced Heart Failure Rounding Note  PCP-Cardiologist: No primary care provider on file.   Subjective:    Patient tested positive for COVID-19. Oxygen has been titrated down to 2L Jamie [name].  No dyspnea but has had some right-sided chest pain.    She has had right and left thoracenteses now, transudates.   Creatinine down to 1.3 today, now on po Lasix. Weight down another pound.    Objective:   Weight Range: 68.3 kg Body mass index is 26.67 kg/m.   Vital Signs:   Temp:  [97.8 F (36.6 C)-98.8 F (37.1 C)] 98.8 F (37.1 C) (03/09 1200) Pulse Rate:  [75-80] 75 (03/09 1454) Resp:  [15-20] 16 (03/09 1454) BP: (129-178)/(53-68) 140/58 (03/09 1347) SpO2:  [98 %-100 %] 98 % (03/09 1454) Last BM Date: 10/29/19  Weight change: Filed Weights   10/28/19 0324 10/28/19 0512 10/29/19 0646  Weight: 68.6 kg 68.5 kg 68.3 kg    Intake/Output:   Intake/Output Summary (Last 24 hours) at 10/30/2019 1643 Last data filed at 10/30/2019 1351 Gross per 24 hour  Intake 702 ml  Output 1375 ml  Net -673 ml      Physical Exam    General: NAD Neck: No JVD, no thyromegaly or thyroid nodule.  Lungs: Clear to auscultation bilaterally with normal respiratory effort. CV: Nondisplaced PMI.  Heart regular S1/S2, no S3/S4, no murmur.  No peripheral edema.  No carotid bruit.  Normal pedal pulses.  Abdomen: Soft, nontender, no hepatosplenomegaly, no distention.  Skin: Intact without lesions or rashes.  Neurologic: Alert and oriented x 3.  Psych: Normal affect. Extremities: No clubbing or cyanosis. S/p right BKA.  HEENT: Normal.    Telemetry   NSR 70s, personally reviewed  Labs    CBC Recent Labs    10/29/19 0309 10/30/19 0426  WBC 6.1 6.6  NEUTROABS 4.7 5.1  HGB 8.4* 8.4*  HCT 27.4* 26.7*  MCV 80.4 80.2  PLT 203 025   Basic Metabolic Panel Recent Labs    10/29/19 0309 10/30/19 0426  NA 134* 134*  K 3.6  3.4*  CL 99 99  CO2 26 26  GLUCOSE 262* 276*  BUN 35* 22*  CREATININE 1.82* 1.31*  CALCIUM 8.5* 8.6*  MG 1.9 1.6*   Liver Function Tests Recent Labs    10/29/19 0309 10/30/19 0426  AST 19 19  ALT 18 19  ALKPHOS 67 71  BILITOT 0.3 0.3  PROT 5.0* 5.4*  ALBUMIN 2.3* 2.5*   No results for input(s): LIPASE, AMYLASE in the last 72 hours. Cardiac Enzymes No results for input(s): CKTOTAL, CKMB, CKMBINDEX, TROPONINI in the last 72 hours.  BNP: BNP (last 3 results) Recent Labs    10/28/19 0330 10/29/19 0309 10/30/19 0427  BNP 770.4* 334.7* 361.3*    ProBNP (last 3 results) No results for input(s): PROBNP in the last 8760 hours.   D-Dimer Recent Labs    10/29/19 0309 10/30/19 0426  DDIMER 3.82* 3.82*   Hemoglobin A1C No results for input(s): HGBA1C in the last 72 hours. Fasting Lipid Panel No results for input(s): CHOL, HDL, LDLCALC, TRIG, CHOLHDL, LDLDIRECT in the last 72 hours. Thyroid Function Tests No results for input(s): TSH, T4TOTAL, T3FREE, THYROIDAB in the last 72 hours.  Invalid input(s): FREET3  Other results:   Imaging    No results found.   Medications:     Scheduled Medications: . amLODipine  10 mg Oral Daily  . aspirin EC  81 mg Oral Daily  . atorvastatin  80 mg Oral QHS  . busPIRone  5 mg Oral Daily  . Chlorhexidine Gluconate Cloth  6 each Topical Daily  . clonazepam  0.25 mg Oral BID  . cloNIDine  0.3 mg Oral BID  . enoxaparin (LOVENOX) injection  40 mg Subcutaneous Q24H  . FLUoxetine  40 mg Oral Daily  . folic acid  1 mg Oral Daily  . furosemide  60 mg Oral BID  . hydrALAZINE  100 mg Oral Q8H  . insulin aspart  0-15 Units Subcutaneous TID WC  . insulin aspart  0-5 Units Subcutaneous QHS  . insulin glargine  30 Units Subcutaneous QHS  . isosorbide mononitrate  60 mg Oral Daily  . labetalol  300 mg Oral BID  . multivitamin with minerals  1 tablet Oral Daily  . pantoprazole  40 mg Oral Daily  . sodium chloride flush  3 mL  Intravenous Q12H  . spironolactone  25 mg Oral Daily  . thiamine  100 mg Oral Daily    Infusions:   PRN Medications: acetaminophen, albuterol, ALPRAZolam, hydrALAZINE, HYDROcodone-acetaminophen, ipratropium-albuterol, morphine injection, nitroGLYCERIN, ondansetron (ZOFRAN) IV, sodium chloride flush   Assessment/Plan   1. Acute hypoxemic respiratory failure: COVID-19 viral pneumonia + CHF exacerbation.  Weight is down with diuresis, titrating down oxygen. - Completed steroids and remdesivir for COVID-19.  - Back on po Lasix.  2. Acute on chronic diastolic CHF: Last echo at [facility] 2/21 with EF 55-60%, restrictive diastolic function.  Echo here with EF 55-60%, normal RV, IVC suggesting RA pressure 8 mmHg. Creatinine 1.3 today, lower.  Multiple admissions with HTN and CHF exacerbation.  Weight trending down.  NYHA class III. Now looks euvolemic.  - Will plan Cardiomems placement tomorrow for remote monitoring of CHF. - Plan for RHC/LHC tomorrow.  Hold Lasix after tonight.  - I have discussed risks/benefits with a patient and she agrees to procedures.   3. Pleural effusions: Bilateral.  Had thoracenteses at [facility] and again bilaterally here. Transudates with negative cytology. Suspect due to CHF.   4. HTN: Poorly controlled at home, admitted with hypertensive emergency/CHF.  HTN slowly getting under better control.  - Continue amlodipine 10 mg daily.  - Continue hydralazine 100 mg tid. - Continue labetalol 300 bid - Continue clonidine 0.3 bid.   - Continue spironolactone 25 mg daily.  - Hold off on ACEI/ARB with AKI.  - Check renal artery dopplers to rule out renal artery stenosis.  5. AKI: Creatinine to 2.4 with diuresis, now 1.3.  6. Abdominal pain: CT abdomen earlier in admission with 1.5 cm liver lesion but no other significant abdominal abnormality.  She has had abdominal pain throughout this admission.  CT abdomen 3/1 showed no acute abdominal findings.  7. COPD: Per report on 3L  home oxygen.  8. DM: SSI, per [facility].   Length of Stay: 37  Loralie Champagne, MD  10/30/2019, 4:43 PM  Advanced Heart Failure Team Pager [phone] (M-F; 7a - 4p)  Please contact [facility] for night-coverage after hours (4p -7a ) and weekends on [email or web address]

## 2019-10-30 NOTE — Progress Notes (Addendum)
Patient ID: Jamie [name], female   DOB: Aug 20, 1961, 59 y.o.   MRN: [id number]     Advanced Heart Failure Rounding Note  PCP-Cardiologist: No primary care provider on file.   Subjective:    Patient tested positive for COVID-19. Oxygen has been titrated down to 2L [place].  No dyspnea but has had some right-sided chest pain.    She has had right and left thoracenteses now, transudates.   Creatinine down to 1.3 today, now on po Lasix. Weight down another pound.    Objective:   Weight Range: 68.3 kg Body mass index is 26.67 kg/m.   Vital Signs:   Temp:  [97.8 F (36.6 C)-98.8 F (37.1 C)] 98.8 F (37.1 C) (03/09 1200) Pulse Rate:  [75-80] 75 (03/09 1454) Resp:  [15-20] 16 (03/09 1454) BP: (129-178)/(53-68) 140/58 (03/09 1347) SpO2:  [98 %-100 %] 98 % (03/09 1454) Last BM Date: 10/29/19  Weight change: Filed Weights   10/28/19 0324 10/28/19 0512 10/29/19 0646  Weight: 68.6 kg 68.5 kg 68.3 kg    Intake/Output:   Intake/Output Summary (Last 24 hours) at 10/30/2019 1643 Last data filed at 10/30/2019 1351 Gross per 24 hour  Intake 702 ml  Output 1375 ml  Net -673 ml      Physical Exam    General: NAD Neck: No JVD, no thyromegaly or thyroid nodule.  Lungs: Clear to auscultation bilaterally with normal respiratory effort. CV: Nondisplaced PMI.  Heart regular S1/S2, no S3/S4, no murmur.  No peripheral edema.  No carotid bruit.  Normal pedal pulses.  Abdomen: Soft, nontender, no hepatosplenomegaly, no distention.  Skin: Intact without lesions or rashes.  Neurologic: Alert and oriented x 3.  Psych: Normal affect. Extremities: No clubbing or cyanosis. S/p right BKA.  HEENT: Normal.    Telemetry   NSR 70s, personally reviewed  Labs    CBC Recent Labs    10/29/19 0309 10/30/19 0426  WBC 6.1 6.6  NEUTROABS 4.7 5.1  HGB 8.4* 8.4*  HCT 27.4* 26.7*  MCV 80.4 80.2  PLT 203 706   Basic Metabolic Panel Recent Labs    10/29/19 0309 10/30/19 0426  NA 134* 134*  K 3.6  3.4*  CL 99 99  CO2 26 26  GLUCOSE 262* 276*  BUN 35* 22*  CREATININE 1.82* 1.31*  CALCIUM 8.5* 8.6*  MG 1.9 1.6*   Liver Function Tests Recent Labs    10/29/19 0309 10/30/19 0426  AST 19 19  ALT 18 19  ALKPHOS 67 71  BILITOT 0.3 0.3  PROT 5.0* 5.4*  ALBUMIN 2.3* 2.5*   No results for input(s): LIPASE, AMYLASE in the last 72 hours. Cardiac Enzymes No results for input(s): CKTOTAL, CKMB, CKMBINDEX, TROPONINI in the last 72 hours.  BNP: BNP (last 3 results) Recent Labs    10/28/19 0330 10/29/19 0309 10/30/19 0427  BNP 770.4* 334.7* 361.3*    ProBNP (last 3 results) No results for input(s): PROBNP in the last 8760 hours.   D-Dimer Recent Labs    10/29/19 0309 10/30/19 0426  DDIMER 3.82* 3.82*   Hemoglobin A1C No results for input(s): HGBA1C in the last 72 hours. Fasting Lipid Panel No results for input(s): CHOL, HDL, LDLCALC, TRIG, CHOLHDL, LDLDIRECT in the last 72 hours. Thyroid Function Tests No results for input(s): TSH, T4TOTAL, T3FREE, THYROIDAB in the last 72 hours.  Invalid input(s): FREET3  Other results:   Imaging    No results found.   Medications:     Scheduled Medications: . amLODipine  10 mg Oral Daily  . aspirin EC  81 mg Oral Daily  . atorvastatin  80 mg Oral QHS  . busPIRone  5 mg Oral Daily  . Chlorhexidine Gluconate Cloth  6 each Topical Daily  . clonazepam  0.25 mg Oral BID  . cloNIDine  0.3 mg Oral BID  . enoxaparin (LOVENOX) injection  40 mg Subcutaneous Q24H  . FLUoxetine  40 mg Oral Daily  . folic acid  1 mg Oral Daily  . furosemide  60 mg Oral BID  . hydrALAZINE  100 mg Oral Q8H  . insulin aspart  0-15 Units Subcutaneous TID WC  . insulin aspart  0-5 Units Subcutaneous QHS  . insulin glargine  30 Units Subcutaneous QHS  . isosorbide mononitrate  60 mg Oral Daily  . labetalol  300 mg Oral BID  . multivitamin with minerals  1 tablet Oral Daily  . pantoprazole  40 mg Oral Daily  . sodium chloride flush  3 mL  Intravenous Q12H  . spironolactone  25 mg Oral Daily  . thiamine  100 mg Oral Daily    Infusions:   PRN Medications: acetaminophen, albuterol, ALPRAZolam, hydrALAZINE, HYDROcodone-acetaminophen, ipratropium-albuterol, morphine injection, nitroGLYCERIN, ondansetron (ZOFRAN) IV, sodium chloride flush   Assessment/Plan   1. Acute hypoxemic respiratory failure: COVID-19 viral pneumonia + CHF exacerbation.  Weight is down with diuresis, titrating down oxygen. - Completed steroids and remdesivir for COVID-19.  - Back on po Lasix.  2. Acute on chronic diastolic CHF: Last echo at [facility] 2/21 with EF 55-60%, restrictive diastolic function.  Echo here with EF 55-60%, normal RV, IVC suggesting RA pressure 8 mmHg. Creatinine 1.3 today, lower.  Multiple admissions with HTN and CHF exacerbation.  Weight trending down.  NYHA class III. Now looks euvolemic.  - Will plan Cardiomems placement tomorrow for remote monitoring of CHF. - Plan for RHC/LHC tomorrow.  Hold Lasix after tonight.  - I have discussed risks/benefits with a patient and she agrees to procedures.   3. Pleural effusions: Bilateral.  Had thoracenteses at [facility] and again bilaterally here. Transudates with negative cytology. Suspect due to CHF.   4. HTN: Poorly controlled at home, admitted with hypertensive emergency/CHF.  HTN slowly getting under better control.  - Continue amlodipine 10 mg daily.  - Continue hydralazine 100 mg tid. - Continue labetalol 300 bid - Continue clonidine 0.3 bid.   - Continue spironolactone 25 mg daily.  - Hold off on ACEI/ARB with AKI.  - Check renal artery dopplers to rule out renal artery stenosis.  5. AKI: Creatinine to 2.4 with diuresis, now 1.3.  6. Abdominal pain: CT abdomen earlier in admission with 1.5 cm liver lesion but no other significant abdominal abnormality.  She has had abdominal pain throughout this admission.  CT abdomen 3/1 showed no acute abdominal findings.  7. COPD: Per report on 3L  home oxygen.  8. DM: SSI, per [facility].   Length of Stay: 34  Loralie Champagne, MD  10/30/2019, 4:43 PM  Advanced Heart Failure Team Pager [phone] (M-F; 7a - 4p)  Please contact [facility] for night-coverage after hours (4p -7a ) and weekends on [email or web address]

## 2019-10-30 NOTE — Progress Notes (Signed)
Inpatient Diabetes Program Recommendations  AACE/ADA: New Consensus Statement on Inpatient Glycemic Control (2015)  Target Ranges:  Prepandial:   less than 140 mg/dL      Peak postprandial:   less than 180 mg/dL (1-2 hours)      Critically ill patients:  140 - 180 mg/dL   Lab Results  Component Value Date   GLUCAP 268 (H) 10/30/2019   HGBA1C 6.9 (H) 10/23/2019    Review of Glycemic Control  Results for TAIRA, KNABE (MRN [id number]) as of 10/30/2019 09:07  Ref. Range 10/29/2019 07:58 10/29/2019 12:07 10/29/2019 16:21 10/29/2019 17:12 10/29/2019 19:42 10/30/2019 04:15 10/30/2019 07:52  Glucose-Capillary Latest Ref Range: 70 - 99 mg/dL 203 (H)  Novolog 5 units given 413 (H)  Novolog 30 units given 145 (H) 117 (H) 171 (H)     Lantus 30 units given 255 (H) 268 (H)    Diabetes history: DM2  Outpatient Diabetes medications: Novolog 10 units TID with meals + Basaglar 40 units QHS  Current orders for Inpatient glycemic control:  Novolog 0-15 units TID + Lantus 30 units daily   Inpatient Diabetes Program Recommendations:    Glucose trends increased significantly after meals yesterday into the 400's.  Pt may benefit from adding Novolog Meal coverage tid if eating >50% of meals.  Thank you, Tama Headings RN, MSN, BC-ADM Inpatient Diabetes Coordinator Team Pager [phone] (8a-5p)

## 2019-10-31 ENCOUNTER — Inpatient Hospital Stay (HOSPITAL_COMMUNITY): Payer: Medicaid Other

## 2019-10-31 ENCOUNTER — Encounter (HOSPITAL_COMMUNITY): Payer: Medicaid Other

## 2019-10-31 ENCOUNTER — Encounter (HOSPITAL_COMMUNITY)
Admission: AD | Disposition: A | Discharge status: Skilled Nursing Facility | Payer: Self-pay | Source: Other Acute Inpatient Hospital | Attending: Internal Medicine

## 2019-10-31 DIAGNOSIS — I509 Heart failure, unspecified: Secondary | ICD-10-CM

## 2019-10-31 DIAGNOSIS — J810 Acute pulmonary edema: Secondary | ICD-10-CM

## 2019-10-31 HISTORY — PX: RIGHT/LEFT HEART CATH AND CORONARY ANGIOGRAPHY: CATH118266

## 2019-10-31 HISTORY — PX: PRESSURE SENSOR/CARDIOMEMS: CATH118258

## 2019-10-31 LAB — COMPREHENSIVE METABOLIC PANEL
ALT: 18 U/L (ref 0–44)
AST: 18 U/L (ref 15–41)
Albumin: 2.4 g/dL — ABNORMAL LOW (ref 3.5–5.0)
Alkaline Phosphatase: 67 U/L (ref 38–126)
Anion gap: 11 (ref 5–15)
BUN: 17 mg/dL (ref 6–20)
CO2: 26 mmol/L (ref 22–32)
Calcium: 9 mg/dL (ref 8.9–10.3)
Chloride: 100 mmol/L (ref 98–111)
Creatinine, Ser: 1.11 mg/dL — ABNORMAL HIGH (ref 0.44–1.00)
GFR calc Af Amer: >60 mL/min (ref >60)
GFR calc non Af Amer: 54 mL/min — ABNORMAL LOW (ref >60)
Glucose, Bld: 175 mg/dL — ABNORMAL HIGH (ref 70–99)
Potassium: 4.2 mmol/L (ref 3.5–5.1)
Sodium: 137 mmol/L (ref 135–145)
Total Bilirubin: 0.4 mg/dL (ref 0.3–1.2)
Total Protein: 5.6 g/dL — ABNORMAL LOW (ref 6.5–8.1)

## 2019-10-31 LAB — POCT I-STAT EG7
Acid-Base Excess: 2 mmol/L (ref 0.0–2.0)
Bicarbonate: 25.6 mmol/L (ref 20.0–28.0)
Bicarbonate: 27.3 mmol/L (ref 20.0–28.0)
Calcium, Ion: 1.23 mmol/L (ref 1.15–1.40)
Calcium, Ion: 1.27 mmol/L (ref 1.15–1.40)
HCT: 26 % — ABNORMAL LOW (ref 36.0–46.0)
HCT: 26 % — ABNORMAL LOW (ref 36.0–46.0)
Hemoglobin: 8.8 g/dL — ABNORMAL LOW (ref 12.0–15.0)
Hemoglobin: 8.8 g/dL — ABNORMAL LOW (ref 12.0–15.0)
O2 Saturation: 69 %
O2 Saturation: 70 %
Potassium: 3.9 mmol/L (ref 3.5–5.1)
Potassium: 4 mmol/L (ref 3.5–5.1)
Sodium: 135 mmol/L (ref 135–145)
Sodium: 136 mmol/L (ref 135–145)
TCO2: 27 mmol/L (ref 22–32)
TCO2: 29 mmol/L (ref 22–32)
pCO2, Ven: 43.8 mmHg — ABNORMAL LOW (ref 44.0–60.0)
pCO2, Ven: 46.4 mmHg (ref 44.0–60.0)
pH, Ven: 7.375 (ref 7.250–7.430)
pH, Ven: 7.377 (ref 7.250–7.430)
pO2, Ven: 37 mmHg (ref 32.0–45.0)
pO2, Ven: 38 mmHg (ref 32.0–45.0)

## 2019-10-31 LAB — GLUCOSE, CAPILLARY
Glucose-Capillary: 204 mg/dL — ABNORMAL HIGH (ref 70–99)
Glucose-Capillary: 162 mg/dL — ABNORMAL HIGH (ref 70–99)
Glucose-Capillary: 173 mg/dL — ABNORMAL HIGH (ref 70–99)
Glucose-Capillary: 122 mg/dL — ABNORMAL HIGH (ref 70–99)
Glucose-Capillary: 136 mg/dL — ABNORMAL HIGH (ref 70–99)

## 2019-10-31 LAB — CBC WITH DIFFERENTIAL/PLATELET
Abs Immature Granulocytes: 0.1 10*3/uL — ABNORMAL HIGH (ref 0.00–0.07)
Basophils Absolute: 0 10*3/uL (ref 0.0–0.1)
Basophils Relative: 0 %
Eosinophils Absolute: 0.1 10*3/uL (ref 0.0–0.5)
Eosinophils Relative: 2 %
HCT: 27.7 % — ABNORMAL LOW (ref 36.0–46.0)
Hemoglobin: 8.7 g/dL — ABNORMAL LOW (ref 12.0–15.0)
Immature Granulocytes: 2 %
Lymphocytes Relative: 13 %
Lymphs Abs: 0.9 10*3/uL (ref 0.7–4.0)
MCH: 25.1 pg — ABNORMAL LOW (ref 26.0–34.0)
MCHC: 31.4 g/dL (ref 30.0–36.0)
MCV: 80.1 fL (ref 80.0–100.0)
Monocytes Absolute: 0.6 10*3/uL (ref 0.1–1.0)
Monocytes Relative: 9 %
Neutro Abs: 5.1 10*3/uL (ref 1.7–7.7)
Neutrophils Relative %: 74 %
Platelets: 240 10*3/uL (ref 150–400)
RBC: 3.46 MIL/uL — ABNORMAL LOW (ref 3.87–5.11)
RDW: 13.8 % (ref 11.5–15.5)
WBC: 6.9 10*3/uL (ref 4.0–10.5)
nRBC: 0 % (ref 0.0–0.2)

## 2019-10-31 LAB — POCT ACTIVATED CLOTTING TIME: Activated Clotting Time: 191 seconds

## 2019-10-31 LAB — C-REACTIVE PROTEIN: CRP: 0.6 mg/dL (ref <1.0)

## 2019-10-31 LAB — MAGNESIUM: Magnesium: 2 mg/dL (ref 1.7–2.4)

## 2019-10-31 LAB — BRAIN NATRIURETIC PEPTIDE: B Natriuretic Peptide: 346.7 pg/mL — ABNORMAL HIGH (ref 0.0–100.0)

## 2019-10-31 SURGERY — RIGHT/LEFT HEART CATH AND CORONARY ANGIOGRAPHY
Anesthesia: LOCAL

## 2019-10-31 MED ORDER — ONDANSETRON HCL 4 MG/2ML IJ SOLN: 4.0000 mg | Freq: Four times a day (QID) | INTRAMUSCULAR | Status: DC | PRN

## 2019-10-31 MED ORDER — SODIUM CHLORIDE 0.9% FLUSH: 3.0000 mL | INTRAVENOUS | Status: DC | PRN

## 2019-10-31 MED ORDER — CLOPIDOGREL BISULFATE 75 MG PO TABS
75.0000 mg | ORAL_TABLET | Freq: Every day | ORAL | Status: DC
  Administered 2019-11-01 – 2019-11-06 (×6): 75 mg via ORAL
  Filled 2019-10-31 (×6): qty 1

## 2019-10-31 MED ORDER — LABETALOL HCL 5 MG/ML IV SOLN: 10.0000 mg | INTRAVENOUS | Status: AC | PRN

## 2019-10-31 MED ORDER — MIDAZOLAM HCL 2 MG/2ML IJ SOLN
INTRAMUSCULAR | Status: AC
  Filled 2019-10-31: qty 2

## 2019-10-31 MED ORDER — IOHEXOL 350 MG/ML SOLN
INTRAVENOUS | Status: DC | PRN
  Administered 2019-10-31: 17:00:00 60 mL

## 2019-10-31 MED ORDER — SODIUM CHLORIDE 0.9 % IV SOLN: INTRAVENOUS | Status: DC

## 2019-10-31 MED ORDER — ACETAMINOPHEN 325 MG PO TABS: 650.0000 mg | ORAL_TABLET | ORAL | Status: DC | PRN

## 2019-10-31 MED ORDER — HEPARIN (PORCINE) IN NACL 1000-0.9 UT/500ML-% IV SOLN
INTRAVENOUS | Status: DC | PRN
  Administered 2019-10-31 (×2): 500 mL

## 2019-10-31 MED ORDER — HEPARIN SODIUM (PORCINE) 5000 UNIT/ML IJ SOLN
5000.0000 [IU] | Freq: Three times a day (TID) | INTRAMUSCULAR | Status: DC
  Administered 2019-10-31 – 2019-11-02 (×5): 5000 [IU] via SUBCUTANEOUS
  Filled 2019-10-31 (×5): qty 1

## 2019-10-31 MED ORDER — LIDOCAINE HCL (PF) 1 % IJ SOLN
INTRAMUSCULAR | Status: AC
  Filled 2019-10-31: qty 30

## 2019-10-31 MED ORDER — FENTANYL CITRATE (PF) 100 MCG/2ML IJ SOLN
INTRAMUSCULAR | Status: AC
  Filled 2019-10-31: qty 2

## 2019-10-31 MED ORDER — SODIUM CHLORIDE 0.9 % IV SOLN: INTRAVENOUS | Status: AC

## 2019-10-31 MED ORDER — HEPARIN SODIUM (PORCINE) 1000 UNIT/ML IJ SOLN
INTRAMUSCULAR | Status: DC | PRN
  Administered 2019-10-31: 3500 [IU] via INTRAVENOUS

## 2019-10-31 MED ORDER — FENTANYL CITRATE (PF) 100 MCG/2ML IJ SOLN
INTRAMUSCULAR | Status: DC | PRN
  Administered 2019-10-31: 50 ug via INTRAVENOUS
  Administered 2019-10-31 (×3): 25 ug via INTRAVENOUS

## 2019-10-31 MED ORDER — LIDOCAINE HCL (PF) 1 % IJ SOLN
INTRAMUSCULAR | Status: DC | PRN
  Administered 2019-10-31: 15 mL
  Administered 2019-10-31: 2 mL
  Administered 2019-10-31: 15 mL

## 2019-10-31 MED ORDER — SODIUM CHLORIDE 0.9 % IV SOLN: 250.0000 mL | INTRAVENOUS | Status: DC | PRN

## 2019-10-31 MED ORDER — HEPARIN (PORCINE) IN NACL 1000-0.9 UT/500ML-% IV SOLN
INTRAVENOUS | Status: DC | PRN
  Administered 2019-10-31: 500 mL

## 2019-10-31 MED ORDER — VERAPAMIL HCL 2.5 MG/ML IV SOLN
INTRAVENOUS | Status: DC | PRN
  Administered 2019-10-31: 10 mL via INTRA_ARTERIAL

## 2019-10-31 MED ORDER — ASPIRIN 81 MG PO CHEW
81.0000 mg | CHEWABLE_TABLET | ORAL | Status: AC
  Administered 2019-10-31: 81 mg via ORAL
  Filled 2019-10-31: qty 1

## 2019-10-31 MED ORDER — ROSUVASTATIN CALCIUM 20 MG PO TABS
20.0000 mg | ORAL_TABLET | Freq: Every day | ORAL | Status: DC
  Administered 2019-10-31 – 2019-11-05 (×5): 20 mg via ORAL
  Filled 2019-10-31 (×5): qty 1
  Filled 2019-10-31: qty 4

## 2019-10-31 MED ORDER — HYDRALAZINE HCL 20 MG/ML IJ SOLN: 10.0000 mg | INTRAMUSCULAR | Status: AC | PRN

## 2019-10-31 MED ORDER — MIDAZOLAM HCL 2 MG/2ML IJ SOLN
INTRAMUSCULAR | Status: DC | PRN
  Administered 2019-10-31 (×3): 1 mg via INTRAVENOUS

## 2019-10-31 MED ORDER — SODIUM CHLORIDE 0.9% FLUSH
3.0000 mL | Freq: Two times a day (BID) | INTRAVENOUS | Status: DC
  Administered 2019-10-31: 3 mL via INTRAVENOUS

## 2019-10-31 MED ORDER — CLOPIDOGREL BISULFATE 300 MG PO TABS
300.0000 mg | ORAL_TABLET | Freq: Once | ORAL | Status: AC
  Administered 2019-10-31: 300 mg via ORAL
  Filled 2019-10-31 (×2): qty 1

## 2019-10-31 MED ORDER — ASPIRIN 81 MG PO CHEW: 81.0000 mg | CHEWABLE_TABLET | ORAL | Status: DC

## 2019-10-31 SURGICAL SUPPLY — 19 items
CARDIOMEMS PA SENSOR W/DELIVER (Prosthesis & Implant Heart) ×2 IMPLANT
CATH 5FR JL3.5 JR4 ANG PIG MP (CATHETERS) ×1 IMPLANT
CATH SWAN GANZ 7F STRAIGHT (CATHETERS) ×1 IMPLANT
DEVICE RAD COMP TR BAND LRG (VASCULAR PRODUCTS) ×1 IMPLANT
GLIDESHEATH SLEND SS 6F .021 (SHEATH) ×1 IMPLANT
GUIDEWIRE INQWIRE 1.5J.035X260 (WIRE) IMPLANT
INQWIRE 1.5J .035X260CM (WIRE) ×2
KIT HEART LEFT (KITS) ×2 IMPLANT
PACK CARDIAC CATHETERIZATION (CUSTOM PROCEDURE TRAY) ×2 IMPLANT
SENSOR CARDIOMEMS PA W/DELIVER (Prosthesis & Implant Heart) IMPLANT
SHEATH AVANTI 11F 11CM (SHEATH) ×2 IMPLANT
SHEATH PINNACLE 6F 10CM (SHEATH) ×1 IMPLANT
SHEATH PINNACLE 7F 10CM (SHEATH) ×2 IMPLANT
SHEATH PINNACLE 9F 10CM (SHEATH) ×2 IMPLANT
SHEATH PROBE COVER 6X72 (BAG) ×2 IMPLANT
TRANSDUCER W/STOPCOCK (MISCELLANEOUS) ×2 IMPLANT
WIRE EMERALD 3MM-J .035X150CM (WIRE) ×1 IMPLANT
WIRE HI TORQ VERSACORE-J 145CM (WIRE) ×1 IMPLANT
WIRE NITREX .018X300 STIFF (WIRE) ×1 IMPLANT

## 2019-10-31 NOTE — Progress Notes (Signed)
OT Cancellation Note  Patient Details Name: Jamie [name] MRN: [id number] DOB: 11/06/1960   Cancelled Treatment:    Reason Eval/Treat Not Completed: Patient declined, no reason specified( OT attempted to see pt this AM prior to procedures. pt refused, reporting too tired to stand. Pt requested OT to check back after procedures. Will re-attempt as time allows)  [name] 10/31/2019, 1:56 PM

## 2019-10-31 NOTE — Progress Notes (Signed)
PICC line care: Site unremarkable. No longer in use. Pt has procedure scheduled today. Requested RN address need for PICC with MD after procedure.

## 2019-10-31 NOTE — Progress Notes (Signed)
Inpatient Diabetes Program Recommendations  AACE/ADA: New Consensus Statement on Inpatient Glycemic Control   Target Ranges:  Prepandial:   less than 140 mg/dL      Peak postprandial:   less than 180 mg/dL (1-2 hours)      Critically ill patients:  140 - 180 mg/dL   Results for Jamie [name], Jamie [name] (MRN [id number]) as of 10/31/2019 12:15  Ref. Range 10/30/2019 07:52 10/30/2019 11:48 10/30/2019 15:44 10/30/2019 19:52 10/31/2019 07:40  Glucose-Capillary Latest Ref Range: 70 - 99 mg/dL 268 (H) 276 (H) 270 (H) 234 (H) 204 (H)   Review of Glycemic Control  Diabetes history: DM2 Outpatient Diabetes medications: Basaglar 40 u nits QHS, Novolog 10 units TID with meals Current orders for Inpatient glycemic control: Lantus 30 units QHS, Novolog 0-15 units TID with meals, Novolog 0-5 units QHS  Inpatient Diabetes Program Recommendations:    Insulin-Basal: Please consider increasing Lantus to 33 units QHS.  Insulin-Meal Coverage: Please consider ordering Novolog 4 units TID with meals for meal coverage if patient eats at least 50% of meals.  Thanks, Barnie Alderman, RN, MSN, CDE Diabetes Coordinator Inpatient Diabetes Program [phone] (Team Pager from 8am to 5pm)

## 2019-10-31 NOTE — Progress Notes (Signed)
Received patient from cath lab with TR Band in right radial. Per Ramond Marrow RN, patient has 14 cc of air at 1616. Upon assessment, radial pulse +2, no pain, tingling or numbness per patient. Will continue to monitor.

## 2019-10-31 NOTE — Progress Notes (Signed)
Attempted renal artery duplex. Patient is on their way to cath lab.  Preliminary results can be found under CV proc under chart review.  10/31/2019 2:03 PM  Kalib Bhagat, K., RDMS, RVT

## 2019-10-31 NOTE — Progress Notes (Signed)
VAST responded to bedisde to Remove PICC per MD order. Pt verbalized she is having a couple of procedures done this afternoon and will need her line. PICC necessity to be re-evaluated after procedures completed.

## 2019-10-31 NOTE — Progress Notes (Signed)
Patient ID: Jamie [name], female   DOB: Jul 29, 1961, 59 y.o.   MRN: [id number]     Advanced Heart Failure Rounding Note  PCP-Cardiologist: No primary care provider on file.   Subjective:    Patient tested positive for COVID-19. Oxygen has been titrated down to 2L [place].  No dyspnea at rest, no chest pain.     She has had right and left thoracenteses now, transudates.   Creatinine down to 1.11 today.   RHC/LHC + Cardiomems placement. Coronary Findings  Diagnostic Dominance: Right Left Main  Mild 20% distal narrowing.  Left Anterior Descending  30% stenosis proximal LAD prior to D1. 50% stenosis LAD at D1. 80-90% stenosis mid LAD. 75% proximal D1 stenosis, moderate-large vessel.  Left Circumflex  Long 70% stenosis ostial to proximal LCx. Tiny OM1 with diffuse severe disease. Small OM2 with 99% proximal stenosis. 60% stenosis proximal PLOM.  Right Coronary Artery  Long area of calcified plaque from the proximal into the mid RCA. 90% stenosis proximally, 80% stenosis in the mid RCA. 80% stenosis mid PDA. 70% stenosis mid PLV.  Intervention  No interventions have been documented. Right Heart  Right Heart Pressures RHC Procedural Findings: Hemodynamics (mmHg) RA mean 2 RV 29/4 PA 27/10, mean 16 PCWP mean 13 LV 116/8 AO 113/53  Oxygen saturations: PA 69% AO 100%  Cardiac Output (Fick) 6.2  Cardiac Index (Fick) 3.63      Objective:   Weight Range: 68.3 kg Body mass index is 26.67 kg/m.   Vital Signs:   Temp:  [97.7 F (36.5 C)-98.4 F (36.9 C)] 97.7 F (36.5 C) (03/10 1940) Pulse Rate:  [63-93] 65 (03/10 1900) Resp:  [8-21] 21 (03/10 1900) BP: (101-161)/(55-101) 101/55 (03/10 1900) SpO2:  [97 %-100 %] 100 % (03/10 1900) Last BM Date: 10/29/19  Weight change: Filed Weights   10/28/19 0324 10/28/19 0512 10/29/19 0646  Weight: 68.6 kg 68.5 kg 68.3 kg    Intake/Output:   Intake/Output Summary (Last 24 hours) at 10/31/2019 1947 Last data filed at 10/31/2019  1800 Gross per 24 hour  Intake 360.13 ml  Output 750 ml  Net -389.87 ml      Physical Exam    General: NAD Neck: No JVD, no thyromegaly or thyroid nodule.  Lungs: Clear to auscultation bilaterally with normal respiratory effort. CV: Nondisplaced PMI.  Heart regular S1/S2, no S3/S4, no murmur.  No peripheral edema.  No carotid bruit.  Normal pedal pulses.  Abdomen: Soft, nontender, no hepatosplenomegaly, no distention.  Skin: Intact without lesions or rashes.  Neurologic: Alert and oriented x 3.  Psych: Normal affect. Extremities: No clubbing or cyanosis. S/p right BKA.  HEENT: Normal.    Telemetry   NSR 70s, personally reviewed  Labs    CBC Recent Labs    10/30/19 0426 10/30/19 0426 10/31/19 0410 10/31/19 0410 10/31/19 1446 10/31/19 1448  WBC 6.6  --  6.9  --   --   --   NEUTROABS 5.1  --  5.1  --   --   --   HGB 8.4*   < > 8.7*   < > 8.8* 8.8*  HCT 26.7*   < > 27.7*   < > 26.0* 26.0*  MCV 80.2  --  80.1  --   --   --   PLT 222  --  240  --   --   --    < > = values in this interval not displayed.   Basic Metabolic Panel Recent Labs  10/30/19 0426 10/30/19 0426 10/31/19 0410 10/31/19 0410 10/31/19 1446 10/31/19 1448  NA 134*   < > 137   < > 135 136  K 3.4*   < > 4.2   < > 4.0 3.9  CL 99  --  100  --   --   --   CO2 26  --  26  --   --   --   GLUCOSE 276*  --  175*  --   --   --   BUN 22*  --  17  --   --   --   CREATININE 1.31*  --  1.11*  --   --   --   CALCIUM 8.6*  --  9.0  --   --   --   MG 1.6*  --  2.0  --   --   --    < > = values in this interval not displayed.   Liver Function Tests Recent Labs    10/30/19 0426 10/31/19 0410  AST 19 18  ALT 19 18  ALKPHOS 71 67  BILITOT 0.3 0.4  PROT 5.4* 5.6*  ALBUMIN 2.5* 2.4*   No results for input(s): LIPASE, AMYLASE in the last 72 hours. Cardiac Enzymes No results for input(s): CKTOTAL, CKMB, CKMBINDEX, TROPONINI in the last 72 hours.  BNP: BNP (last 3 results) Recent Labs     10/29/19 0309 10/30/19 0427 10/31/19 0410  BNP 334.7* 361.3* 346.7*    ProBNP (last 3 results) No results for input(s): PROBNP in the last 8760 hours.   D-Dimer Recent Labs    10/29/19 0309 10/30/19 0426  DDIMER 3.82* 3.82*   Hemoglobin A1C No results for input(s): HGBA1C in the last 72 hours. Fasting Lipid Panel No results for input(s): CHOL, HDL, LDLCALC, TRIG, CHOLHDL, LDLDIRECT in the last 72 hours. Thyroid Function Tests No results for input(s): TSH, T4TOTAL, T3FREE, THYROIDAB in the last 72 hours.  Invalid input(s): FREET3  Other results:   Imaging    CARDIAC CATHETERIZATION  Result Date: 10/31/2019 1. Optimized filling pressures after diuresis. 2. Successful Cardiomems placement. 3. Severe 3 vessel disease. Anatomy is best-suited to CABG, but patient is s/p BKA and does not have a prosthesis yet (not mobile).  She would have a hard time successfully rehabing after a cardiac surgery. Discussed with interventional team, will tentatively plan intervention on RCA and LAD.  The RCA will require atherectomy.  Will aim for Friday, will load Plavix prior.   DG Chest Port 1 View  Result Date: 10/31/2019 CLINICAL DATA:  Shortness of breath. EXAM: PORTABLE CHEST 1 VIEW COMPARISON:  10/28/2019 FINDINGS: The right PICC line is stable. The cardiac silhouette, mediastinal and hilar contours are within normal limits and unchanged. Overall improved lung aeration with resolving edema, atelectasis and effusions. IMPRESSION: Improving lung aeration with resolving edema, atelectasis and effusions. Electronically Signed   By: Marijo Sanes M.D.   On: 10/31/2019 09:19     Medications:     Scheduled Medications: . amLODipine  10 mg Oral Daily  . aspirin EC  81 mg Oral Daily  . atorvastatin  80 mg Oral QHS  . busPIRone  5 mg Oral Daily  . Chlorhexidine Gluconate Cloth  6 each Topical Daily  . clonazepam  0.25 mg Oral BID  . cloNIDine  0.3 mg Oral BID  . [START ON 11/01/2019]  clopidogrel  75 mg Oral Daily  . FLUoxetine  40 mg Oral Daily  . folic acid  1  mg Oral Daily  . heparin  5,000 Units Subcutaneous Q8H  . hydrALAZINE  100 mg Oral Q8H  . insulin aspart  0-15 Units Subcutaneous TID WC  . insulin aspart  0-5 Units Subcutaneous QHS  . insulin glargine  30 Units Subcutaneous QHS  . isosorbide mononitrate  60 mg Oral Daily  . labetalol  300 mg Oral BID  . multivitamin with minerals  1 tablet Oral Daily  . pantoprazole  40 mg Oral Daily  . rosuvastatin  20 mg Oral q1800  . sodium chloride flush  3 mL Intravenous Q12H  . spironolactone  25 mg Oral Daily  . thiamine  100 mg Oral Daily    Infusions: . sodium chloride 75 mL/hr at 10/31/19 1800  . sodium chloride      PRN Medications: sodium chloride, acetaminophen, albuterol, ALPRAZolam, hydrALAZINE, hydrALAZINE, HYDROcodone-acetaminophen, ipratropium-albuterol, labetalol, morphine injection, nitroGLYCERIN, ondansetron (ZOFRAN) IV, sodium chloride flush   Assessment/Plan   1. Acute hypoxemic respiratory failure: COVID-19 viral pneumonia + CHF exacerbation.  Weight is down with diuresis, titrating down oxygen. - Completed steroids and remdesivir for COVID-19.  - Holding po Lasix with low filling pressures on RHC today.  2. Acute on chronic diastolic CHF: Multiple admissions over the last 6 months with hypertensive urgency/diastolic CHF.  Last echo at [facility] 2/21 with EF 55-60%, restrictive diastolic function.  Echo here with EF 55-60%, normal RV, IVC suggesting RA pressure 8 mmHg. Creatinine 1.1 today, lower.  RHC today showed optimized filling pressures.  With multiple recent diastolic CHF admissions, we placed a Cardiomems device today.  - Hold Lasix for now with low filling pressures, will likely restart tomorrow.    3. Pleural effusions: Bilateral.  Had thoracenteses at [facility] and again bilaterally here. Transudates with negative cytology. Suspect due to CHF.   4. HTN: Poorly controlled at home,  admitted with hypertensive emergency/CHF.  HTN slowly getting under better control.  - Continue amlodipine 10 mg daily.  - Continue hydralazine 100 mg tid. - Continue labetalol 300 bid - Continue clonidine 0.3 bid.   - Continue spironolactone 25 mg daily.  - Hold off on ACEI/ARB with AKI.  - Check renal artery dopplers to rule out renal artery stenosis.  5. AKI: Creatinine to 2.4 with diuresis, now 1.1.  6. Abdominal pain: CT abdomen earlier in admission with 1.5 cm liver lesion but no other significant abdominal abnormality.  She has had abdominal pain throughout this admission.  CT abdomen 3/1 showed no acute abdominal findings.  7. COPD: Per report on 3L home oxygen.  8. DM: SSI, per [facility].  9. CAD: Coronary angiography today showed severe, calcified 3 vessel disease.  This likely plays a role in her multiple episodes of flash pulmonary edema.  Ideally, based on this anatomy she would have CABG.  However, she has had right BKA and does not have a prosthesis.  She is currently non-ambulatory.  She would not be able to rehab effectively after CABG.  - I reviewed her films with interventional colleagues.  Will plan atherectomy/PCI to RCA + PCI to LAD on Friday.  For now, will not intervene on LCx disease.  - Start Crestor 20 mg daily.  - Continue ASA, load Plavix.   Length of Stay: [place], MD  10/31/2019, 7:47 PM  Advanced Heart Failure Team Pager [phone] (M-F; 7a - 4p)  Please contact [facility] for night-coverage after hours (4p -7a ) and weekends on [email or web address]

## 2019-10-31 NOTE — Interval H&P Note (Signed)
History and Physical Interval Note:  10/31/2019 2:19 PM  Jamie [name]  has presented today for surgery, with the diagnosis of heart failure.  The various methods of treatment have been discussed with the patient and family. After consideration of risks, benefits and other options for treatment, the patient has consented to  Procedure(s): RIGHT/LEFT HEART CATH AND CORONARY ANGIOGRAPHY (N/A) PRESSURE SENSOR/CARDIOMEMS (N/A) as a surgical intervention.  The patient's history has been reviewed, patient examined, no change in status, stable for surgery.  I have reviewed the patient's chart and labs.  Questions were answered to the patient's satisfaction.     Fernie Grimm [employer]

## 2019-10-31 NOTE — Progress Notes (Signed)
PROGRESS NOTE                                                                                                                                                                                                             Patient Demographics:    Jamie [name], is a 59 y.o. 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    She was recently hospitalized twice to [facility] for shortness of breath, her work-up showed bilateral transudative pleural effusion, she had thoracentesis suggesting transudative fluid with no malignant cells.  Reason for recurrent effusion was thought to be acute on chronic diastolic CHF.  She was also found to have liver mass.  She was finally transferred to [facility] for cardiology evaluation for recurrent CHF.   Subjective:   Patient in bed, appears comfortable, denies any headache, no fever, no chest pain or pressure, no shortness of breath , no abdominal pain. No focal weakness.    Assessment  & Plan :   Severe Acute hypoxic respiratory failure due to recurrent Acute on chronic diastolic CHF EF 34% .   -Recurrent bilateral pleural effusions which are transudate if worked up in [place], EF 60%.   - She  had massive pulmonary edema with pleural effusion at the time of presentation and almost got intubated, was aggressively diuresed and monitored in ICU for a day, now breathing much improved.  She also  underwent ultrasound-guided thoracentesis on 3/3 and 3/4 respectively with 750 cc of transudative fluid removed from right side and 1300 cc of fluid removed from the left side.  Again this appears transudative with mesothelial cells and cytology.  Patient has had 2 other bouts of thoracentesis at [facility] earlier this month. -Chest x-ray this morning showing improved pleural effusion. -Cardiology input greatly appreciated, plan for right/left cardiac  cath.  Acute Covid 19 Viral Pneumonitis during the ongoing 2020 Covid 19 Pandemic  - her main disease process and hypoxia is due to #1 above which is acute on chronic diastolic CHF.  Her Covid disease itself seems to be mild to moderate for which he has completed her treatment with remdesivir and steroids.  Encouraged the patient to sit up in chair in the daytime use I-S and flutter valve for pulmonary toiletry and then prone in bed when at night.  SpO2: 100 % O2 Flow Rate (L/min): 2 L/min FiO2 (%): 40 %  Recent Labs  Lab 10/26/19 0313 10/26/19 0313 10/27/19 0418 10/28/19 0330 10/29/19 0309 10/30/19 0426 10/30/19 0427 10/31/19 0410  CRP 0.8   < > 0.6 0.8 0.6 0.6  --  0.6  DDIMER 3.02*  --  3.02* 3.47* 3.82* 3.82*  --   --   BNP 864.8*   < > 822.5* 770.4* 334.7*  --  361.3* 346.7*  PROCALCITON 0.32  --  0.31 0.22 0.21 0.27  --   --    < > = values in this interval not displayed.    Hepatic Function Latest Ref Rng & Units 10/31/2019 10/30/2019 10/29/2019  Total Protein 6.5 - 8.1 g/dL 5.6(L) 5.4(L) 5.0(L)  Albumin 3.5 - 5.0 g/dL 2.4(L) 2.5(L) 2.3(L)  AST 15 - 41 U/L 18 19 19   ALT 0 - 44 U/L 18 19 18   Alk Phosphatase 38 - 126 U/L 67 71 67  Total Bilirubin 0.3 - 1.2 mg/dL 0.4 0.3 0.3      PAD.   -S/p right BKA, CT evidence of abdominal diffuse calcification with recurrent abdominal pain.  Highly questionable for ischemic bowel, for now continue aspirin and statin for secondary prevention, clear liquid diet and monitor.  She likely  has diffuse atherosclerosis due to uncontrolled diabetes mellitus and hypertension.  She denies any history of smoking, with supportive care she is somewhat better and tolerating liquid diet.  Hypertensive crisis.  -Stopped  IV Clevidipine drip, oral medications adjusted and as needed IV hydralazine added.  Currently on combination of beta-blocker, Norvasc, clonidine, hydralazine along with Imdur.   GERD with nausea.  Also has HX of diabetic gastroparesis.   - For now supportive care with clear liquid diet, PPI and nausea medications.  Dyslipidemia.  - Continue statin.   Anemia of chronic disease.  - Monitor.  Hypertensive urgency.  - Resolved.  Depression and anxiety.   -Continue combination of bupropion, fluoxetine, buspirone and Xanax.  AKI at [facility] with creatinine of 1.5.  Renal function worse likely due to combination of IV contrast from CT and diuresis, Lasix was held and she was gently hydrated for 2 days from 10/25/2019 to 10/26/2019 with total of 2 L of normal saline, creatinine has stabilized and now trending down, will commence low-dose Lasix on 10/27/2019 as she is developing crackles and evidence of reoccurrence of pleural effusion.  Liver mass suspicious on CT scan.  No mass noted on MRI.  Hypomagnesemia and hypokalemia.  Replaced.   DM2 - placed on Lantus sliding scale along with Premeal NovoLog.  Monitor and adjust  Lab Results  Component Value Date   HGBA1C 6.9 (H) 10/23/2019     CBG (last 3)  Recent Labs    10/31/19 0740 10/31/19 1141 10/31/19 1215  GLUCAP 204* 173* 162*     Condition - Extremely Guarded  Family Communication  :  Daughter in law RN on 10/22/19 @ [phone], called on 10/23/2019 at 10:20 AM -  full mailbox.  Code Status :  Full  Diet :   Diet Order            Diet NPO time specified Except for: Sips with Meds  Diet effective midnight               Disposition Plan  : Unstable for discharge, she remains with recurrent pleural  effusions, plan for cardiac cath today  Consults  : Pulmonary, cardiology  Procedures  :    MRI abdomen.  No suspicious liver lesion.    Ultrasound-guided right-sided thoracentesis on 10/24/2019 with 750 cc of fluid removed.  Done by pulmonary.  Ultrasound guided left-sided thoracentesis on 10/25/2019 with 1300 cc of fluid removed.  Done by pulmonary  Renal ultrasound.  CT - 1. Persistent moderate to large bilateral pleural effusions with overlying atelectasis. 2. No acute abdominal/pelvic findings, mass lesions or adenopathy. 3. Severe/advanced atherosclerotic calcifications involving the aorta and branch vessels for age. 4. Small to moderate amount of persistent free pelvic fluid. No obvious cause. 5. Calcified uterine fibroids. Aortic Atherosclerosis.  TTE from [facility] - EF 60%, dCHF  PICC line and Foley  requested.  PUD Prophylaxis : PPI  DVT Prophylaxis  :  Lovenox    Lab Results  Component Value Date   PLT 240 10/31/2019    Inpatient Medications  Scheduled Meds: . amLODipine  10 mg Oral Daily  . aspirin EC  81 mg Oral Daily  . atorvastatin  80 mg Oral QHS  . busPIRone  5 mg Oral Daily  . Chlorhexidine Gluconate Cloth  6 each Topical Daily  . clonazepam  0.25 mg Oral BID  . cloNIDine  0.3 mg Oral BID  . enoxaparin (LOVENOX) injection  40 mg Subcutaneous Q24H  . FLUoxetine  40 mg Oral Daily  . folic acid  1 mg Oral Daily  . furosemide  60 mg Oral BID  . hydrALAZINE  100 mg Oral Q8H  . insulin aspart  0-15 Units Subcutaneous TID WC  . insulin aspart  0-5 Units Subcutaneous QHS  . insulin glargine  30 Units Subcutaneous QHS  . isosorbide mononitrate  60 mg Oral Daily  . labetalol  300 mg Oral BID  . multivitamin with minerals  1 tablet Oral Daily  . pantoprazole  40 mg Oral Daily  . sodium chloride flush  3 mL Intravenous Q12H  . spironolactone  25 mg Oral Daily  . thiamine  100 mg Oral Daily   Continuous Infusions: . sodium chloride    . sodium chloride     PRN  Meds:.sodium chloride, acetaminophen, albuterol, ALPRAZolam, hydrALAZINE, HYDROcodone-acetaminophen, ipratropium-albuterol, morphine injection, nitroGLYCERIN, ondansetron (ZOFRAN) IV, sodium chloride flush, sodium chloride flush  Antibiotics  :    Anti-infectives (From admission, onward)   Start     Dose/Rate Route Frequency Ordered Stop   10/23/19 1000  remdesivir 100 mg in sodium chloride 0.9 % 100 mL IVPB     100 mg 200 mL/hr over 30 Minutes Intravenous Daily 10/22/19 1713 10/26/19 1006   10/22/19 1800  remdesivir 200 mg in sodium chloride 0.9% 250 mL IVPB     200 mg 580 mL/hr over 30 Minutes Intravenous Once 10/22/19 1713 10/22/19 1843   10/21/19 2100  cefTRIAXone (ROCEPHIN) 1 g in sodium chloride 0.9 % 100 mL IVPB  Status:  Discontinued     1 g 200 mL/hr over 30 Minutes Intravenous Every 24 hours 10/21/19 1744 10/22/19 0748   10/21/19 2100  azithromycin (ZITHROMAX) tablet 500 mg  Status:  Discontinued     500 mg Oral Daily 10/21/19 1744 10/22/19 0748       Time Spent in minutes  30   Phillips Climes M.D on 10/31/2019 at 2:07 PM  To page go to [email or web address] - password [email or web address]  [facility] -  Office  [phone]     See all Orders from today for further details    Objective:   Vitals:   10/30/19 2159 10/31/19 0000 10/31/19 0400 10/31/19 0800  BP: (!) 155/67 (!) 159/63 (!) 152/64 (!) 161/60  Pulse: 72 71 78 80  Resp:  11 13 15   Temp:   97.9 F (36.6 C) 97.9 F (36.6 C)  TempSrc:   Axillary Oral  SpO2:  100% 99% 100%  Weight:      Height:        Wt Readings from Last 3 Encounters:  10/29/19 68.3 kg     Intake/Output Summary (Last 24 hours) at 10/31/2019 1407 Last data filed at 10/31/2019 1113 Gross per 24 hour  Intake 240 ml  Output 550 ml  Net -310 ml     Physical Exam  Awake Alert, Oriented X 3, No new F.N deficits, Normal affect Symmetrical Chest wall movement, diminished air movement bilaterally, bibasilar crackles RRR,No Gallops,Rubs or new  Murmurs, No Parasternal Heave +ve B.Sounds, Abd Soft, No tenderness, No rebound - guarding or rigidity. No Cyanosis, Clubbing or edema, right BKA      Data Review:    CBC Recent Labs  Lab 10/27/19 0418 10/28/19 0330 10/29/19 0309 10/30/19 0426 10/31/19 0410  WBC 5.6 5.5 6.1 6.6 6.9  HGB 8.5* 9.1* 8.4* 8.4* 8.7*  HCT 27.1* 28.9* 27.4* 26.7* 27.7*  PLT 198 199 203 222 240  MCV 80.7 79.8* 80.4 80.2 80.1  MCH 25.3* 25.1* 24.6* 25.2* 25.1*  MCHC 31.4 31.5 30.7 31.5 31.4  RDW 13.6 13.6 13.5 13.7 13.8  LYMPHSABS 0.7 0.7 0.6* 0.7 0.9  MONOABS 0.5 0.5 0.5 0.5 0.6  EOSABS 0.1 0.1 0.1 0.2 0.1  BASOSABS 0.0 0.0 0.0 0.0 0.0    Chemistries  Recent Labs  Lab 10/27/19 0418 10/28/19 0330 10/29/19 0309 10/30/19 0426 10/31/19 0410  NA 136 136 134* 134* 137  K 4.3 3.8 3.6 3.4* 4.2  CL 101 102 99 99 100  CO2 27 26 26 26 26   GLUCOSE 194* 201* 262* 276* 175*  BUN 58* 40* 35* 22* 17  CREATININE 2.35* 1.56* 1.82* 1.31* 1.11*  CALCIUM 8.5* 8.5* 8.5* 8.6* 9.0  MG 1.8 1.5* 1.9 1.6* 2.0  AST 17 18 19 19 18   ALT 17 17 18 19 18   ALKPHOS 55 52 67 71 67  BILITOT 0.4 0.4 0.3 0.3 0.4   ------------------------------------------------------------------------------------------------------------------ No results for input(s): CHOL, HDL, LDLCALC, TRIG, CHOLHDL, LDLDIRECT in the last 72 hours.  Lab Results  Component Value Date   HGBA1C 6.9 (H) 10/23/2019   ------------------------------------------------------------------------------------------------------------------ No results for input(s): TSH, T4TOTAL, T3FREE, THYROIDAB in the last 72 hours.  Invalid input(s): FREET3  Cardiac Enzymes No results for input(s): CKMB, TROPONINI, MYOGLOBIN in the last 168 hours.  Invalid input(s): CK ------------------------------------------------------------------------------------------------------------------    Component Value Date/Time   BNP 346.7 (H) 10/31/2019 0410    Micro  Results Recent Results (from the past 240 hour(s))  SARS CORONAVIRUS 2 (TAT 6-24 HRS) Nasopharyngeal Nasopharyngeal Swab     Status: Abnormal   Collection Time: 10/21/19  6:39 PM   Specimen: Nasopharyngeal Swab  Result Value Ref Range Status   SARS Coronavirus 2  POSITIVE (A) NEGATIVE Final    Comment: RESULT CALLED TO, READ BACK BY AND VERIFIED WITH: K. AJRDY,RN 0017 10/22/2019 T. TYSOR (NOTE) SARS-CoV-2 target nucleic acids are DETECTED. The SARS-CoV-2 RNA is generally detectable in upper and lower respiratory specimens during the acute phase of infection. Positive results are indicative of the presence of SARS-CoV-2 RNA. Clinical correlation with patient history and other diagnostic information is  necessary to determine patient infection status. Positive results do not rule out bacterial infection or co-infection with other viruses.  The expected result is Negative. Fact Sheet for Patients: SugarRoll.be Fact Sheet for Healthcare Providers: https://www.woods-mathews.com/ This test is not yet approved or cleared by the [place] FDA and  has been authorized for detection and/or diagnosis of SARS-CoV-2 by FDA under an Emergency Use Authorization (EUA). This EUA will remain  in effect (meaning this test can be used) for th e duration of the COVID-19 declaration under Section 564(b)(1) of the Act, 21 U.S.C. section 360bbb-3(b)(1), unless the authorization is terminated or revoked sooner. Performed at [facility], [place] [address] [place], [place] [address]   MRSA PCR Screening     Status: None   Collection Time: 10/22/19  2:51 PM   Specimen: Nasal Mucosa; Nasopharyngeal  Result Value Ref Range Status   MRSA by PCR NEGATIVE NEGATIVE Final    Comment:        The GeneXpert MRSA Assay (FDA approved for NASAL specimens only), is one component of a comprehensive MRSA colonization surveillance program. It is not intended to diagnose  MRSA infection nor to guide or monitor treatment for MRSA infections. Performed at [facility], [place] [address] [place], [place] [address]   Stat Gram stain     Status: None   Collection Time: 10/24/19 11:14 AM   Specimen: Pleura; Body Fluid  Result Value Ref Range Status   Specimen Description PLEURAL RIGHT  Final   Special Requests NONE  Final   Gram Stain   Final    RARE WBC PRESENT, PREDOMINANTLY MONONUCLEAR NO ORGANISMS SEEN Performed at [facility], [address], [place], [place] [address]    Report Status 10/24/2019 FINAL  Final  Respiratory Panel by PCR     Status: None   Collection Time: 10/25/19  4:18 AM   Specimen: Nasopharyngeal Swab; Respiratory  Result Value Ref Range Status   Adenovirus NOT DETECTED NOT DETECTED Final   Coronavirus 229E NOT DETECTED NOT DETECTED Final    Comment: (NOTE) The Coronavirus on the Respiratory Panel, DOES NOT test for the novel  Coronavirus (2019 nCoV)    Coronavirus HKU1 NOT DETECTED NOT DETECTED Final   Coronavirus NL63 NOT DETECTED NOT DETECTED Final   Coronavirus OC43 NOT DETECTED NOT DETECTED Final   Metapneumovirus NOT DETECTED NOT DETECTED Final   Rhinovirus / Enterovirus NOT DETECTED NOT DETECTED Final   Influenza A NOT DETECTED NOT DETECTED Final   Influenza B NOT DETECTED NOT DETECTED Final   Parainfluenza Virus 1 NOT DETECTED NOT DETECTED Final   Parainfluenza Virus 2 NOT DETECTED NOT DETECTED Final   Parainfluenza Virus 3 NOT DETECTED NOT DETECTED Final   Parainfluenza Virus 4 NOT DETECTED NOT DETECTED Final   Respiratory Syncytial Virus NOT DETECTED NOT DETECTED Final   Bordetella pertussis NOT DETECTED NOT DETECTED Final   Chlamydophila pneumoniae NOT DETECTED NOT DETECTED Final   Mycoplasma pneumoniae NOT DETECTED NOT DETECTED Final    Comment: Performed at [facility], [place]. [place],  [place] [address]  Body fluid culture (includes gram stain)     Status: None   Collection Time:  10/25/19  4:29 PM   Specimen: Pleural Fluid  Result Value Ref Range Status   Specimen Description FLUID PLEURAL  Final   Special Requests NONE  Final   Gram Stain   Final    FEW WBC PRESENT, PREDOMINANTLY MONONUCLEAR NO ORGANISMS SEEN    Culture   Final    NO GROWTH 3 DAYS Performed at [facility], [place] [address], [place], [place] [address]    Report Status 10/28/2019 FINAL  Final    Radiology Reports MR ABDOMEN WO CONTRAST  Result Date: 10/28/2019 CLINICAL DATA:  59 year old female with history of liver lesion. Follow-up study. EXAM: MRI ABDOMEN WITHOUT CONTRAST TECHNIQUE: Multiplanar multisequence MR imaging was performed without the administration of intravenous contrast. COMPARISON:  No prior abdominal MRI. CT the abdomen and pelvis 10/22/2019. FINDINGS: Comment: Today's study is limited by lack of IV gadolinium for detection and characterization of visceral and/or vascular lesions. Lower chest: Large right and moderate left pleural effusions. Signal in the visualize lung bases dependently, nonspecific, but favored to reflect areas of passive atelectasis. Hepatobiliary: No definite suspicious cystic or solid hepatic lesions are confidently identified on today's noncontrast examination. No intra or extrahepatic biliary ductal dilatation. Gallbladder is normal in appearance. Pancreas: No definite pancreatic mass or peripancreatic fluid collections or inflammatory changes noted on today's noncontrast CT examination. Spleen:  Unremarkable. Adrenals/Urinary Tract: Unenhanced appearance of the kidneys and bilateral adrenal glands are normal. Stomach/Bowel: Visualized portions are unremarkable. Vascular/Lymphatic: No aneurysm identified in the visualized abdominal vasculature. No lymphadenopathy noted in the abdomen. Other: No significant volume of ascites noted in the visualized portions of the peritoneal cavity. Musculoskeletal: No aggressive appearing osseous lesions are noted in the  visualized portions of the skeleton. Mild diffuse body wall edema. IMPRESSION: 1. The lesion of concern on the prior CT scan is not demonstrated on today's noncontrast MRI examination. This may suggest a benign perfusion anomaly on the prior CT scan. Regardless, this lesion is unlikely to be related to the patient's history of abdominal pain. If there is persistent clinical concern, this lesion could be further evaluated with repeat abdominal MRI with and without IV gadolinium. 2. Large right and moderate left pleural effusions with areas of probable passive atelectasis in the dependent portions of the lung bases bilaterally. 3. Mild diffuse body wall edema. Electronically Signed   By: Vinnie Langton M.D.   On: 10/28/2019 08:17   CT ABDOMEN PELVIS W CONTRAST  Result Date: 10/22/2019 CLINICAL DATA:  Diffuse abdominal pain and anemia. EXAM: CT ABDOMEN AND PELVIS WITH CONTRAST TECHNIQUE: Multidetector CT imaging of the abdomen and pelvis was performed using the standard protocol following bolus administration of intravenous contrast. CONTRAST:  37m OMNIPAQUE IOHEXOL 300 MG/ML  SOLN COMPARISON:  CT scan 10/19/2019 FINDINGS: Lower chest: Persistent moderate to large bilateral pleural effusions with significant overlying atelectasis. The heart is normal in size. No pericardial effusion. Age advanced coronary artery calcifications are noted. Hepatobiliary: No focal hepatic lesions or intrahepatic biliary dilatation. Ill-defined area of contrast enhancement and segment 7 likely a vascular shunt. The gallbladder is mildly contracted. No common bile duct dilatation. Pancreas: Scattered calcifications or likely due to prior inflammation. No worrisome pancreatic lesions or acute inflammatory process. No ductal dilatation. Spleen: Normal size. No focal lesions. Adrenals/Urinary Tract: The adrenal glands and kidneys are unremarkable. No worrisome renal lesions or hydronephrosis. The bladder appears normal. Stomach/Bowel:  The stomach, duodenum, small bowel and colon are grossly normal without oral contrast. No acute inflammatory changes, mass lesions or obstructive findings. Vascular/Lymphatic: Severe/advanced atherosclerotic calcifications involving the aorta and branch vessels for age. No aneurysm or dissection. The major venous structures are patent. No mesenteric or retroperitoneal mass or adenopathy. Reproductive: The uterus and ovaries are unremarkable. Calcified fibroid again noted posteriorly. Other: Small to moderate amount of free pelvic fluid is again demonstrated. No obvious cause. Musculoskeletal: Stable compression fracture of L1. No worrisome bone lesions. Age advanced osteoporosis. IMPRESSION: 1. Persistent moderate to large bilateral pleural effusions with overlying atelectasis. 2. No acute abdominal/pelvic findings, mass lesions or adenopathy. 3. Severe/advanced atherosclerotic calcifications involving the aorta and branch vessels for age. 4. Small to moderate amount of persistent free pelvic fluid. No obvious cause. 5. Calcified uterine fibroids. Aortic Atherosclerosis (ICD10-I70.0). Electronically Signed   By: Marijo Sanes M.D.   On: 10/22/2019 13:56   US RENAL  Result Date: 10/25/2019 CLINICAL DATA:  Acute renal failure EXAM: RENAL / URINARY TRACT ULTRASOUND COMPLETE COMPARISON:  None. FINDINGS: Right Kidney: Renal measurements: 13.5 x 5.7 x 4.7 cm. = volume: 188 mL . Echogenicity within normal limits. No mass or hydronephrosis visualized. Left Kidney: Renal measurements: 14.2 x 4.9 x 4.3 cm = volume: 157 mL. Echogenicity within normal limits. No mass or hydronephrosis visualized. Bladder: Decompressed by Foley catheter. Other: None. IMPRESSION: No acute abnormality noted. Electronically Signed   By: Inez Catalina M.D.   On: 10/25/2019 22:11   DG Chest Port 1 View  Result Date: 10/31/2019 CLINICAL DATA:  Shortness of breath. EXAM: PORTABLE CHEST 1 VIEW COMPARISON:  10/28/2019 FINDINGS: The right PICC line  is stable. The cardiac silhouette, mediastinal and hilar contours are within normal limits and unchanged. Overall improved lung aeration with resolving edema, atelectasis and effusions. IMPRESSION: Improving lung aeration with resolving edema, atelectasis and effusions. Electronically Signed   By: Marijo Sanes M.D.   On: 10/31/2019 09:19   DG Chest Port 1 View  Result Date: 10/28/2019 CLINICAL DATA:  Shortness of breath, COVID positive EXAM: PORTABLE CHEST 1 VIEW COMPARISON:  10/26/2019 FINDINGS: Increased density at the lung bases. No pneumothorax. Stable cardiomediastinal contours. Right PICC line is again noted. IMPRESSION: Increased density at the lung bases probably reflecting a combination of pleural effusion and atelectasis. Electronically Signed   By: Macy Mis M.D.   On: 10/28/2019 07:50   DG Chest Port 1 View  Result Date: 10/26/2019 CLINICAL DATA:  Shortness of breath, COVID positive EXAM: PORTABLE CHEST 1 VIEW COMPARISON:  10/25/2019 FINDINGS: Right-sided PICC line terminates in the distal superior vena cava. Heart size is stable and normal accounting for projection. Increasing opacity at the left lung base without silhouetting of left hemidiaphragm. Subtle opacities are seen on a background of mild interstitial prominence worse in the left lung base, also present in right mid chest and lower chest. Visualized skeletal structures are unremarkable. IMPRESSION: 1. Increasing opacity at the left lung base without silhouetting of the left hemidiaphragm. Findings may represent developing effusion. 2. Subtle opacities in the right mid chest and lower chest. Findings could be seen in the setting of COVID-19 pneumonia. Continued follow-up suggested. Electronically Signed   By: Zetta Bills M.D.   On: 10/26/2019 08:59   DG Chest Port 1 View  Result Date: 10/25/2019 CLINICAL DATA:  Status post thoracentesis. EXAM: PORTABLE CHEST 1 VIEW COMPARISON:  Radiograph earlier this day. Lung bases from  abdominal CT 10/22/2019 FINDINGS: Decreased left pleural effusion. No  visualized pneumothorax. Hazy opacity at the right lung base likely small right pleural effusion and atelectasis. Right upper extremity PICC remains in place. Borderline cardiomegaly with unchanged mediastinal contours. Improved pulmonary edema from earlier this day. Bones are under mineralized. IMPRESSION: 1. Decreased left pleural effusion after thoracentesis. No visualized pneumothorax. 2. Hazy opacity at the right lung base likely small right pleural effusion and atelectasis. 3. Improving pulmonary edema. Electronically Signed   By: Keith Rake M.D.   On: 10/25/2019 17:17   DG Chest Port 1 View  Result Date: 10/25/2019 CLINICAL DATA:  Status post thoracentesis. COVID positive. EXAM: PORTABLE CHEST 1 VIEW COMPARISON:  One-view chest x-ray 10/23/19 FINDINGS: Heart size is normal. Right-sided PICC line terminates in the distal SVC. Left pleural effusion and airspace disease is improved. No pneumothorax is present. Mild edema is present. IMPRESSION: 1. Improving left pleural effusion and airspace disease. 2. No pneumothorax. 3. Mild edema. Electronically Signed   By: San Morelle M.D.   On: 10/25/2019 06:57   DG CHEST PORT 1 VIEW  Result Date: 10/23/2019 CLINICAL DATA:  Acute respiratory insufficiency EXAM: PORTABLE CHEST 1 VIEW COMPARISON:  October 22, 2019 FINDINGS: The heart size and mediastinal contours are unchanged with mild cardiomegaly. There is a small left pleural effusion. There is slight interval improvement in the hazy/interstitial markings throughout both lungs. No acute osseous abnormality. IMPRESSION: Slight interval improvement in the bilateral hazy airspace opacities which could be due to pulmonary edema or infectious etiology. Small left pleural effusion. Electronically Signed   By: Prudencio Pair M.D.   On: 10/23/2019 06:32   DG Chest Port 1 View  Result Date: 10/22/2019 CLINICAL DATA:  CHF respiratory distress.   COVID-19 positive EXAM: PORTABLE CHEST 1 VIEW COMPARISON:  10/20/2019 FINDINGS: Diffuse bilateral airspace disease with basilar predominance shows mild progression. Moderate left effusion has improved in the interval. Probable small right effusion. IMPRESSION: Mild progression of diffuse bilateral airspace disease which may represent edema or pneumonia. Improvement in left pleural effusion. Electronically Signed   By: Franchot Gallo M.D.   On: 10/22/2019 09:28   ECHOCARDIOGRAM COMPLETE  Result Date: 10/23/2019    ECHOCARDIOGRAM REPORT   Patient Name:   Jamie [name]  Date of Exam: 10/23/2019 Medical Rec #:  [id number]  Height:       63.0 in Accession #:    [id number] Weight:       156.5 lb Date of Birth:  01/28/1961 BSA:          1.742 m Patient Age:    53 years   BP:           124/65 mmHg Patient Gender: F          HR:           66 bpm. Exam Location:  Inpatient Procedure: 2D Echo, Cardiac Doppler and Color Doppler Indications:    I50.33 Acute on chronic diastolic (congestive) heart failure  History:        Patient has no prior history of Echocardiogram examinations.                 COPD, TIA and PAD; Risk Factors:Hypertension and Diabetes.                 COVID-19 Positive.  Sonographer:    Jonelle Sidle Dance Referring Phys: Sharon  1. Left ventricular ejection fraction, by estimation, is 55 to 60%. The left ventricle has normal function. The left ventricle has no regional wall motion  abnormalities. Left ventricular diastolic parameters are consistent with Grade II diastolic dysfunction (pseudonormalization). Elevated left atrial pressure.  2. Right ventricular systolic function is normal. The right ventricular size is normal. Tricuspid regurgitation signal is inadequate for assessing PA pressure.  3. Left atrial size was moderately dilated.  4. Large pleural effusion in the left lateral region.  5. The mitral valve is normal in structure and function. Mild mitral valve regurgitation.  6. The  aortic valve is normal in structure and function. Aortic valve regurgitation is not visualized.  7. The inferior vena cava is dilated in size with >50% respiratory variability, suggesting right atrial pressure of 8 mmHg. Comparison(s): No prior Echocardiogram. FINDINGS  Left Ventricle: Left ventricular ejection fraction, by estimation, is 55 to 60%. The left ventricle has normal function. The left ventricle has no regional wall motion abnormalities. The left ventricular internal cavity size was normal in size. There is  no left ventricular hypertrophy. Left ventricular diastolic parameters are consistent with Grade II diastolic dysfunction (pseudonormalization). Elevated left atrial pressure. Right Ventricle: The right ventricular size is normal. No increase in right ventricular wall thickness. Right ventricular systolic function is normal. Tricuspid regurgitation signal is inadequate for assessing PA pressure. Left Atrium: Left atrial size was moderately dilated. Right Atrium: Right atrial size was normal in size. Pericardium: There is no evidence of pericardial effusion. Mitral Valve: The mitral valve is normal in structure and function. Mild mitral annular calcification. Mild mitral valve regurgitation. Tricuspid Valve: The tricuspid valve is normal in structure. Tricuspid valve regurgitation is not demonstrated. Aortic Valve: The aortic valve is normal in structure and function. Aortic valve regurgitation is not visualized. Pulmonic Valve: The pulmonic valve was grossly normal. Pulmonic valve regurgitation is not visualized. Aorta: The aortic root and ascending aorta are structurally normal, with no evidence of dilitation. Venous: The inferior vena cava is dilated in size with greater than 50% respiratory variability, suggesting right atrial pressure of 8 mmHg. IAS/Shunts: No atrial level shunt detected by color flow Doppler. Additional Comments: There is a large pleural effusion in the left lateral region.   LEFT VENTRICLE PLAX 2D LVIDd:         3.79 cm  Diastology LVIDs:         2.87 cm  LV e' lateral:   6.00 cm/s LV PW:         1.27 cm  LV E/e' lateral: 18.3 LV IVS:        0.94 cm  LV e' medial:    3.57 cm/s LVOT diam:     1.80 cm  LV E/e' medial:  30.8 LV SV:         52 LV SV Index:   30 LVOT Area:     2.54 cm  RIGHT VENTRICLE            IVC RV Basal diam:  3.24 cm    IVC diam: 2.47 cm RV Mid diam:    1.67 cm RV S prime:     9.32 cm/s TAPSE (M-mode): 1.8 cm LEFT ATRIUM             Index       RIGHT ATRIUM           Index LA diam:        3.90 cm 2.24 cm/m  RA Area:     15.50 cm LA Vol (A2C):   79.2 ml 45.46 ml/m RA Volume:   42.40 ml  24.33 ml/m LA Vol (A4C):  61.2 ml 35.12 ml/m LA Biplane Vol: 73.7 ml 42.30 ml/m  AORTIC VALVE LVOT Vmax:   89.80 cm/s LVOT Vmean:  62.000 cm/s LVOT VTI:    0.204 m  AORTA Ao Root diam: 2.80 cm Ao Asc diam:  2.40 cm MITRAL VALVE MV Area (PHT): 3.72 cm     SHUNTS MV Decel Time: 204 msec     Systemic VTI:  0.20 m MV E velocity: 110.00 cm/s  Systemic Diam: 1.80 cm MV A velocity: 54.30 cm/s MV E/A ratio:  2.03 Mihai Croitoru MD Electronically signed by Sanda Klein MD Signature Date/Time: 10/23/2019/3:50:43 PM    Final    [place] EKG SITE RITE  Result Date: 10/22/2019 If Site Rite image not attached, placement could not be confirmed due to current cardiac rhythm.  US Abdomen Limited RUQ  Result Date: 10/22/2019 CLINICAL DATA:  Acute right upper quadrant abdominal pain. EXAM: ULTRASOUND ABDOMEN LIMITED RIGHT UPPER QUADRANT COMPARISON:  October 19, 2019. FINDINGS: Gallbladder: No gallstones or wall thickening visualized. No sonographic Murphy sign noted by sonographer. Common bile duct: Diameter: 5 mm which is within normal limits. Liver: No focal lesion identified. Within normal limits in parenchymal echogenicity. Portal vein is patent on color Doppler imaging with normal direction of blood flow towards the liver. Other: Minimal ascites is noted. IMPRESSION: Minimal ascites. No  other abnormality seen in the right upper quadrant of the abdomen. Electronically Signed   By: Marijo Conception M.D.   On: 10/22/2019 09:03

## 2019-11-01 DIAGNOSIS — I2583 Coronary atherosclerosis due to lipid rich plaque: Secondary | ICD-10-CM

## 2019-11-01 LAB — COMPREHENSIVE METABOLIC PANEL
ALT: 22 U/L (ref 0–44)
AST: 20 U/L (ref 15–41)
Albumin: 2.4 g/dL — ABNORMAL LOW (ref 3.5–5.0)
Alkaline Phosphatase: 70 U/L (ref 38–126)
Anion gap: 8 (ref 5–15)
BUN: 16 mg/dL (ref 6–20)
CO2: 24 mmol/L (ref 22–32)
Calcium: 8.3 mg/dL — ABNORMAL LOW (ref 8.9–10.3)
Chloride: 99 mmol/L (ref 98–111)
Creatinine, Ser: 1.03 mg/dL — ABNORMAL HIGH (ref 0.44–1.00)
GFR calc Af Amer: >60 mL/min (ref >60)
GFR calc non Af Amer: 59 mL/min — ABNORMAL LOW (ref >60)
Glucose, Bld: 250 mg/dL — ABNORMAL HIGH (ref 70–99)
Potassium: 4.3 mmol/L (ref 3.5–5.1)
Sodium: 131 mmol/L — ABNORMAL LOW (ref 135–145)
Total Bilirubin: 0.5 mg/dL (ref 0.3–1.2)
Total Protein: 5.3 g/dL — ABNORMAL LOW (ref 6.5–8.1)

## 2019-11-01 LAB — CBC WITH DIFFERENTIAL/PLATELET
Abs Immature Granulocytes: 0.07 10*3/uL (ref 0.00–0.07)
Basophils Absolute: 0 10*3/uL (ref 0.0–0.1)
Basophils Relative: 0 %
Eosinophils Absolute: 0.2 10*3/uL (ref 0.0–0.5)
Eosinophils Relative: 2 %
HCT: 27.2 % — ABNORMAL LOW (ref 36.0–46.0)
Hemoglobin: 8.4 g/dL — ABNORMAL LOW (ref 12.0–15.0)
Immature Granulocytes: 1 %
Lymphocytes Relative: 6 %
Lymphs Abs: 0.5 10*3/uL — ABNORMAL LOW (ref 0.7–4.0)
MCH: 25.1 pg — ABNORMAL LOW (ref 26.0–34.0)
MCHC: 30.9 g/dL (ref 30.0–36.0)
MCV: 81.2 fL (ref 80.0–100.0)
Monocytes Absolute: 0.6 10*3/uL (ref 0.1–1.0)
Monocytes Relative: 7 %
Neutro Abs: 7.1 10*3/uL (ref 1.7–7.7)
Neutrophils Relative %: 84 %
Platelets: 198 10*3/uL (ref 150–400)
RBC: 3.35 MIL/uL — ABNORMAL LOW (ref 3.87–5.11)
RDW: 13.8 % (ref 11.5–15.5)
WBC: 8.4 10*3/uL (ref 4.0–10.5)
nRBC: 0 % (ref 0.0–0.2)

## 2019-11-01 LAB — LIPID PANEL
Cholesterol: 113 mg/dL (ref 0–200)
HDL: 26 mg/dL — ABNORMAL LOW (ref >40)
LDL Cholesterol: 51 mg/dL (ref 0–99)
Total CHOL/HDL Ratio: 4.3 RATIO
Triglycerides: 179 mg/dL — ABNORMAL HIGH (ref <150)
VLDL: 36 mg/dL (ref 0–40)

## 2019-11-01 LAB — TROPONIN I (HIGH SENSITIVITY)
Troponin I (High Sensitivity): 31 ng/L — ABNORMAL HIGH (ref <18)
Troponin I (High Sensitivity): 29 ng/L — ABNORMAL HIGH (ref <18)

## 2019-11-01 LAB — C-REACTIVE PROTEIN: CRP: 0.6 mg/dL (ref <1.0)

## 2019-11-01 LAB — BRAIN NATRIURETIC PEPTIDE: B Natriuretic Peptide: 179.2 pg/mL — ABNORMAL HIGH (ref 0.0–100.0)

## 2019-11-01 LAB — GLUCOSE, CAPILLARY
Glucose-Capillary: 215 mg/dL — ABNORMAL HIGH (ref 70–99)
Glucose-Capillary: 217 mg/dL — ABNORMAL HIGH (ref 70–99)
Glucose-Capillary: 184 mg/dL — ABNORMAL HIGH (ref 70–99)
Glucose-Capillary: 235 mg/dL — ABNORMAL HIGH (ref 70–99)

## 2019-11-01 LAB — MAGNESIUM: Magnesium: 1.7 mg/dL (ref 1.7–2.4)

## 2019-11-01 MED ORDER — ASPIRIN 81 MG PO CHEW
81.0000 mg | CHEWABLE_TABLET | ORAL | Status: AC
  Administered 2019-11-02: 81 mg via ORAL
  Filled 2019-11-01: qty 1

## 2019-11-01 MED ORDER — FUROSEMIDE 40 MG PO TABS
60.0000 mg | ORAL_TABLET | Freq: Every day | ORAL | Status: DC
  Administered 2019-11-01: 60 mg via ORAL
  Filled 2019-11-01: qty 1

## 2019-11-01 MED ORDER — SODIUM CHLORIDE 0.9% FLUSH
3.0000 mL | Freq: Two times a day (BID) | INTRAVENOUS | Status: DC
  Administered 2019-11-03 – 2019-11-04 (×2): 3 mL via INTRAVENOUS
  Administered 2019-11-04: 10 mL via INTRAVENOUS

## 2019-11-01 NOTE — Progress Notes (Signed)
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  She was recently hospitalized twice to [facility] for shortness of breath, her work-up showed bilateral transudative pleural effusion, she had thoracentesis suggesting transudative fluid with no malignant cells.  Reason for recurrent effusion was thought to be acute on chronic diastolic CHF.  She was also found to have liver mass.  She was finally transferred to [facility] for cardiology evaluation for recurrent CHF.  Also positive for Covid.  (patient transferred out of ICU to [facility] 10/24/19). 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    OT comments  No reported precautions after recent cardiac cath. Pt received in recliner chair, required encouragement to participate. Guided pt in one sit to stand transfer with RW at Mod A + 2 with pt presenting with forward flexion, unable to stand fully upright. With assistance to maintain stability, pt tolerated no more than 10 seconds of standing. After standing, pt then reported tightness, was placing hand on chest but when asked: did not verbalize location, if pain getting worse or better, etc. RN notified and MD paged. Guided pt in breathing techniques to attempt to ease discomfort. VSS on 2 L O2. Pt refused to attempt any more activities, did not assist in repositioning. Behavior presentation similar to previous OT session. Continue to  recommend SNF for rehab to assist pt in goal of returning home. Will continue to follow acutely.    Follow Up Recommendations  SNF;Supervision/Assistance - 24 hour    Equipment Recommendations  Other (comment)    Recommendations for Other Services      Precautions / Restrictions Precautions Precautions: Fall Precaution Comments: R BKA April 2020 Required Braces or Orthoses: Other Brace(prosthesis at rehab facility (too small) )       Mobility Bed Mobility               General bed mobility comments: No precautions after yesterday's cardiac cath, confirmed with RN. Patient already OOB in chair  Transfers Overall transfer level: Needs assistance Equipment used: Rolling walker (2 wheeled) Transfers: Sit to/from Stand Sit to Stand: Mod assist;+2 physical assistance         General transfer comment: sit<>stand from recliner chair with modA x 2 and RW, block to LLE to prevent buckling if needed. L foot anterior to COM. Patient forward flexed.     Balance Overall balance assessment: Needs assistance         Standing balance support: Bilateral upper extremity supported Standing balance-Leahy Scale: Poor Standing balance comment: Standing tolerance approx 10 seconds with assist, unable to lift chest fully for upright posture.                           ADL either performed or assessed with clinical judgement   ADL Overall ADL's : Needs assistance/impaired  Functional mobility during ADLs: Moderate assistance;+2 for physical assistance;+2 for safety/equipment General ADL Comments: Pt Mod A + 2 for sit to stand with RW trial      Vision       Perception     Praxis      Cognition Arousal/Alertness: Awake/alert Behavior During Therapy: Flat affect Overall Cognitive Status: No family/caregiver present to determine baseline cognitive functioning                                 General  Comments: Pt with continued resistance to participate in therapy, citing fatigue, etc         Exercises Exercises: Other exercises Other Exercises Other Exercises: pursed lip breathing for relaxation    Shoulder Instructions       General Comments Patient on 2L oxygen via [place]. Oxygen saturation 99%, HR 75 bpm, RR 14 post stand, BP 124/66. Patient noting tightness and would not describe where just putting hand on chest. RN informed and in to assess patient.     Pertinent Vitals/ Pain       Pain Assessment: 0-10 Pain Score: 5  Pain Location: patient touching chest but would not say where Pain Descriptors / Indicators: Tightness(non-radiating ) Pain Intervention(s): Limited activity within patient's tolerance;Utilized relaxation techniques;Repositioned(RN informed and paged MD )  Home Living                                          Prior Functioning/Environment              Frequency  Min 2X/week        Progress Toward Goals  OT Goals(current goals can now be found in the care plan section)  Progress towards OT goals: Progressing toward goals  Acute Rehab OT Goals Patient Stated Goal: to go to Rehab and then home OT Goal Formulation: With patient Time For Goal Achievement: 11/06/19 Potential to Achieve Goals: Good ADL Goals Pt Will Perform Grooming: sitting;with modified independence Pt Will Perform Lower Body Bathing: with supervision;sitting/lateral leans;sit to/from stand Pt Will Perform Upper Body Dressing: with modified independence;sitting Pt Will Perform Lower Body Dressing: with supervision;sit to/from stand;sitting/lateral leans Pt Will Transfer to Toilet: with min guard assist;squat pivot transfer;bedside commode Pt Will Perform Toileting - Clothing Manipulation and hygiene: with min guard assist;sitting/lateral leans;sit to/from stand Pt/caregiver will Perform Home Exercise Program: Increased strength;Both right and left upper  extremity;With written HEP provided;Independently  Plan Discharge plan remains appropriate    Co-evaluation    PT/OT/SLP Co-Evaluation/Treatment: Yes Reason for Co-Treatment: Complexity of the patient's impairments (multi-system involvement);Necessary to address cognition/behavior during functional activity;To address functional/ADL transfers   OT goals addressed during session: Other (comment)(functional transfers for ADLs )      AM-PAC OT "6 Clicks" Daily Activity     Outcome Measure   Help from another person eating meals?: None Help from another person taking care of personal grooming?: A Little Help from another person toileting, which includes using toliet, bedpan, or urinal?: A Lot Help from another person bathing (including washing, rinsing, drying)?: A Lot Help from another person to put on and taking off regular upper body clothing?: A Little Help from another person to put on and taking off regular lower body clothing?: A Lot 6 Click Score: 16    End of Session Equipment Utilized During  Treatment: Gait belt;Rolling walker;Oxygen  OT Visit Diagnosis: Muscle weakness (generalized) (M62.81);Other abnormalities of gait and mobility (R26.89)   Activity Tolerance Patient limited by fatigue;Other (comment)(reports of pain and limited motivation )   Patient Left in chair;with call bell/phone within reach   Nurse Communication Mobility status;Other (comment)(reports of tightness )        Time: 1203-1228 OT Time Calculation (min): 25 min  Charges: OT General Charges $OT Visit: 1 Visit OT Treatments $Therapeutic Activity: 8-22 mins  [name], OTR/L   [name] 11/01/2019, 2:10 PM

## 2019-11-01 NOTE — Progress Notes (Addendum)
After Nitroglycerine 1 tb sl, patient verbalized that the pain is gone. Will continue to monitor.

## 2019-11-01 NOTE — Progress Notes (Signed)
Inpatient Diabetes Program Recommendations  AACE/ADA: New Consensus Statement on Inpatient Glycemic Control (2015)  Target Ranges:  Prepandial:   less than 140 mg/dL      Peak postprandial:   less than 180 mg/dL (1-2 hours)      Critically ill patients:  140 - 180 mg/dL   Lab Results  Component Value Date   GLUCAP 217 (H) 11/01/2019   HGBA1C 6.9 (H) 10/23/2019    Review of Glycemic Control Results for Jamie, [name] (MRN [id number]) as of 11/01/2019 12:06  Ref. Range 10/31/2019 07:40 10/31/2019 11:41  Glucose-Capillary Latest Ref Range: 70 - 99 mg/dL 204 (H) 173 (H)    Results for Jamie [name], Jamie [name] (MRN [id number]) as of 11/01/2019 12:06  Ref. Range 10/31/2019 12:15 10/31/2019 16:53 10/31/2019 20:50 11/01/2019 07:49 11/01/2019 11:34  Glucose-Capillary Latest Ref Range: 70 - 99 mg/dL 162 (H) 122 (H) 136 (H) 215 (H) 217 (H)    Diabetes history: DM2 Outpatient Diabetes medications: Basaglar 40 u nits QHS, Novolog 10 units TID with meals Current orders for Inpatient glycemic control: Lantus 30 units QHS, Novolog 0-15 units TID with meals, Novolog 0-5 units QHS  Inpatient Diabetes Program Recommendations:     Please consider  Lantus 33 units QHS  Novolog 3-4 units TID with meals if eats at least 50% of meal  Thank you, Reche Dixon, RN, BSN Diabetes Coordinator Inpatient Diabetes Program [phone] (team pager from 8a-5p)

## 2019-11-01 NOTE — Progress Notes (Signed)
Patient up in the chair, started few minutes ago to complain of chest pain. She describes the pain being tightness in the mid chest, with no radiation. VS stable. MD notified. Will continue to monitor.

## 2019-11-01 NOTE — Progress Notes (Signed)
Patient ID: Jamie [name], female   DOB: 01-07-1961, 59 y.o.   MRN: [id number]     Advanced Heart Failure Rounding Note  PCP-Cardiologist: No primary care provider on file.   Subjective:    Patient tested positive for COVID-19. Oxygen has been titrated down to 2L [place].  No dyspnea at rest, no chest pain.     She has had right and left thoracenteses now, transudates.   Creatinine down to 1.03 today.   RHC/LHC + Cardiomems placement. Coronary Findings  Diagnostic Dominance: Right Left Main  Mild 20% distal narrowing.  Left Anterior Descending  30% stenosis proximal LAD prior to D1. 50% stenosis LAD at D1. 80-90% stenosis mid LAD. 75% proximal D1 stenosis, moderate-large vessel.  Left Circumflex  Long 70% stenosis ostial to proximal LCx. Tiny OM1 with diffuse severe disease. Small OM2 with 99% proximal stenosis. 60% stenosis proximal PLOM.  Right Coronary Artery  Long area of calcified plaque from the proximal into the mid RCA. 90% stenosis proximally, 80% stenosis in the mid RCA. 80% stenosis mid PDA. 70% stenosis mid PLV.  Intervention  No interventions have been documented. Right Heart  Right Heart Pressures RHC Procedural Findings: Hemodynamics (mmHg) RA mean 2 RV 29/4 PA 27/10, mean 16 PCWP mean 13 LV 116/8 AO 113/53  Oxygen saturations: PA 69% AO 100%  Cardiac Output (Fick) 6.2  Cardiac Index (Fick) 3.63      Objective:   Weight Range: 65.1 kg Body mass index is 25.42 kg/m.   Vital Signs:   Temp:  [97.5 F (36.4 C)-98.9 F (37.2 C)] 98.4 F (36.9 C) (03/11 1158) Pulse Rate:  [63-93] 73 (03/11 1200) Resp:  [8-21] 13 (03/11 1236) BP: (101-151)/(54-101) 126/55 (03/11 1236) SpO2:  [98 %-100 %] 100 % (03/11 1200) Weight:  [65.1 kg] 65.1 kg (03/11 0536) Last BM Date: 10/29/19  Weight change: Filed Weights   10/28/19 0512 10/29/19 0646 11/01/19 0536  Weight: 68.5 kg 68.3 kg 65.1 kg    Intake/Output:   Intake/Output Summary (Last 24 hours) at  11/01/2019 1335 Last data filed at 11/01/2019 0937 Gross per 24 hour  Intake 1309.54 ml  Output 700 ml  Net 609.54 ml      Physical Exam    General: NAD Neck: No JVD, no thyromegaly or thyroid nodule.  Lungs: Clear to auscultation bilaterally with normal respiratory effort. CV: Nondisplaced PMI.  Heart regular S1/S2, no S3/S4, no murmur.  No peripheral edema.   Abdomen: Soft, nontender, no hepatosplenomegaly, no distention.  Skin: Intact without lesions or rashes.  Neurologic: Alert and oriented x 3.  Psych: Normal affect. Extremities: No clubbing or cyanosis. S/p R BKA.  HEENT: Normal.    Telemetry   NSR 70s, personally reviewed  Labs    CBC Recent Labs    10/31/19 0410 10/31/19 1446 10/31/19 1448 11/01/19 0416  WBC 6.9  --   --  8.4  NEUTROABS 5.1  --   --  7.1  HGB 8.7*   < > 8.8* 8.4*  HCT 27.7*   < > 26.0* 27.2*  MCV 80.1  --   --  81.2  PLT 240  --   --  198   < > = values in this interval not displayed.   Basic Metabolic Panel Recent Labs    10/31/19 0410 10/31/19 1446 10/31/19 1448 11/01/19 0416  NA 137   < > 136 131*  K 4.2   < > 3.9 4.3  CL 100  --   --  99  CO2 26  --   --  24  GLUCOSE 175*  --   --  250*  BUN 17  --   --  16  CREATININE 1.11*  --   --  1.03*  CALCIUM 9.0  --   --  8.3*  MG 2.0  --   --  1.7   < > = values in this interval not displayed.   Liver Function Tests Recent Labs    10/31/19 0410 11/01/19 0416  AST 18 20  ALT 18 22  ALKPHOS 67 70  BILITOT 0.4 0.5  PROT 5.6* 5.3*  ALBUMIN 2.4* 2.4*   No results for input(s): LIPASE, AMYLASE in the last 72 hours. Cardiac Enzymes No results for input(s): CKTOTAL, CKMB, CKMBINDEX, TROPONINI in the last 72 hours.  BNP: BNP (last 3 results) Recent Labs    10/30/19 0427 10/31/19 0410 11/01/19 0416  BNP 361.3* 346.7* 179.2*    ProBNP (last 3 results) No results for input(s): PROBNP in the last 8760 hours.   D-Dimer Recent Labs    10/30/19 0426  DDIMER 3.82*    Hemoglobin A1C No results for input(s): HGBA1C in the last 72 hours. Fasting Lipid Panel Recent Labs    11/01/19 0416  CHOL 113  HDL 26*  LDLCALC 51  TRIG 179*  CHOLHDL 4.3   Thyroid Function Tests No results for input(s): TSH, T4TOTAL, T3FREE, THYROIDAB in the last 72 hours.  Invalid input(s): FREET3  Other results:   Imaging    CARDIAC CATHETERIZATION  Result Date: 10/31/2019 1. Optimized filling pressures after diuresis. 2. Successful Cardiomems placement. 3. Severe 3 vessel disease. Anatomy is best-suited to CABG, but patient is s/p BKA and does not have a prosthesis yet (not mobile).  She would have a hard time successfully rehabing after a cardiac surgery. Discussed with interventional team, will tentatively plan intervention on RCA and LAD.  The RCA will require atherectomy.  Will aim for Friday, will load Plavix prior.     Medications:     Scheduled Medications: . amLODipine  10 mg Oral Daily  . aspirin EC  81 mg Oral Daily  . atorvastatin  80 mg Oral QHS  . busPIRone  5 mg Oral Daily  . Chlorhexidine Gluconate Cloth  6 each Topical Daily  . clonazepam  0.25 mg Oral BID  . cloNIDine  0.3 mg Oral BID  . clopidogrel  75 mg Oral Daily  . FLUoxetine  40 mg Oral Daily  . folic acid  1 mg Oral Daily  . furosemide  60 mg Oral Daily  . heparin  5,000 Units Subcutaneous Q8H  . hydrALAZINE  100 mg Oral Q8H  . insulin aspart  0-15 Units Subcutaneous TID WC  . insulin aspart  0-5 Units Subcutaneous QHS  . insulin glargine  30 Units Subcutaneous QHS  . isosorbide mononitrate  60 mg Oral Daily  . labetalol  300 mg Oral BID  . multivitamin with minerals  1 tablet Oral Daily  . pantoprazole  40 mg Oral Daily  . rosuvastatin  20 mg Oral q1800  . sodium chloride flush  3 mL Intravenous Q12H  . spironolactone  25 mg Oral Daily  . thiamine  100 mg Oral Daily    Infusions: . sodium chloride      PRN Medications: sodium chloride, acetaminophen, albuterol,  ALPRAZolam, hydrALAZINE, HYDROcodone-acetaminophen, ipratropium-albuterol, morphine injection, nitroGLYCERIN, ondansetron (ZOFRAN) IV, sodium chloride flush   Assessment/Plan   1. Acute hypoxemic respiratory failure: COVID-19 viral  pneumonia + CHF exacerbation.  Weight is down with diuresis, titrating down oxygen. - Completed steroids and remdesivir for COVID-19.  - Filling pressures optimized on RHC, now on po Lasix.  2. Acute on chronic diastolic CHF: Multiple admissions over the last 6 months with hypertensive urgency/diastolic CHF.  Echo at [facility] 2/21 with EF 55-60%, restrictive diastolic function.  Echo here with EF 55-60%, normal RV, IVC suggesting RA pressure 8 mmHg. Creatinine 1.03 today, lower.  RHC 3/10 showed optimized filling pressures.  With multiple recent diastolic CHF admissions, we placed a Cardiomems device.  - Hold Lasix tomorrow morning for PCI, restart Lasix at 60 mg daily afterwards.  3. Pleural effusions: Bilateral.  Had thoracenteses at [facility] and again bilaterally here. Transudates with negative cytology. Suspect due to CHF.   4. HTN: Poorly controlled at home, admitted with hypertensive emergency/CHF.  HTN slowly getting under better control.  - Continue amlodipine 10 mg daily.  - Continue hydralazine 100 mg tid. - Continue labetalol 300 bid - Continue clonidine 0.3 bid.   - Continue spironolactone 25 mg daily.  - Hold off on ACEI/ARB with AKI.  - Check renal artery dopplers to rule out renal artery stenosis.  5. AKI: Creatinine to 2.4 with diuresis, now 1.03.   6. Abdominal pain: CT abdomen earlier in admission with 1.5 cm liver lesion but no other significant abdominal abnormality.  She has had abdominal pain throughout this admission.  CT abdomen 3/1 showed no acute abdominal findings.  7. COPD: Per report on 3L home oxygen, now down to 2L and probably could continue to lower.  8. DM: SSI, per [facility].  9. CAD: Coronary angiography today showed severe, calcified  3 vessel disease.  This likely plays a role in her multiple episodes of flash pulmonary edema.  Ideally, based on this anatomy she would have CABG.  However, she has had right BKA and does not have a prosthesis.  She is currently non-ambulatory.  She would not be able to rehab effectively after CABG.  - I reviewed her films with interventional colleagues.  Will plan atherectomy/PCI to RCA + PCI to LAD on Friday.  For now, will not intervene on LCx disease.  - Start Crestor 20 mg daily.  - Continue ASA, started Plavix.   Length of Stay: 55  Loralie Champagne, MD  11/01/2019, 1:35 PM  Advanced Heart Failure Team Pager [phone] (M-F; [place])  Please contact [facility] for night-coverage after hours (4p -7a ) and weekends on [email or web address]

## 2019-11-01 NOTE — Progress Notes (Signed)
PROGRESS NOTE                                                                                                                                                                                                             Patient Demographics:    Jamie [name], is a 59 y.o. 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    She was recently hospitalized twice to [facility] for shortness of breath, her work-up showed bilateral transudative pleural effusion, she had thoracentesis suggesting transudative fluid with no malignant cells.  Reason for recurrent effusion was thought to be acute on chronic diastolic CHF.  She was also found to have liver mass.  She was finally transferred to [facility] for cardiology evaluation for recurrent CHF.  Patient had right/left cardiac cath 3/10, significant for three-vessel disease, she is not a candidate for CABG.   Subjective:   Patient in bed, appears comfortable, denies any headache, no fever, had an episode of chest pain earlier this morning, resolved with no intervention, troponin and EKG nonacute.   Assessment  & Plan :   Severe Acute hypoxic respiratory failure due to recurrent Acute on chronic diastolic CHF EF 95% .   -Recurrent bilateral pleural effusions which are transudate if worked up in [place], EF 60%.   - She  had massive pulmonary edema with pleural effusion at the time of presentation  and almost got intubated, was aggressively diuresed and monitored in ICU for a day, now breathing much improved.  She also underwent ultrasound-guided thoracentesis on 3/3 and 3/4 respectively with 750 cc of transudative fluid removed from right side and 1300 cc of fluid removed from the left side.  Again this appears transudative with mesothelial cells and cytology.  Patient has had 2 other bouts of thoracentesis at [facility] earlier  this month. -Repeat chest x-ray clear/10, showing improved pleural effusion -Cardiology input greatly appreciated, please see discussion below  Acute Covid 19 Viral Pneumonitis during the ongoing 2020 Covid 19 Pandemic  - her main disease process and hypoxia is due to #1 above which is acute on chronic diastolic CHF.  Her Covid disease itself seems to be mild to moderate for which he has completed her treatment with remdesivir and steroids.  Encouraged the patient to sit up in chair in the daytime use I-S and flutter valve for pulmonary toiletry and then prone in bed when at night.  SpO2: 100 % O2 Flow Rate (L/min): 2 L/min FiO2 (%): 40 %  Recent Labs  Lab 10/26/19 0313 10/26/19 0313 10/27/19 0418 10/27/19 0418 10/28/19 0330 10/29/19 0309 10/30/19 0426 10/30/19 0427 10/31/19 0410 11/01/19 0416  CRP 0.8   < > 0.6   < > 0.8 0.6 0.6  --  0.6 0.6  DDIMER 3.02*  --  3.02*  --  3.47* 3.82* 3.82*  --   --   --   BNP 864.8*   < > 822.5*   < > 770.4* 334.7*  --  361.3* 346.7* 179.2*  PROCALCITON 0.32  --  0.31  --  0.22 0.21 0.27  --   --   --    < > = values in this interval not displayed.    Hepatic Function Latest Ref Rng & Units 11/01/2019 10/31/2019 10/30/2019  Total Protein 6.5 - 8.1 g/dL 5.3(L) 5.6(L) 5.4(L)  Albumin 3.5 - 5.0 g/dL 2.4(L) 2.4(L) 2.5(L)  AST 15 - 41 U/L 20 18 19   ALT 0 - 44 U/L 22 18 19   Alk Phosphatase 38 - 126 U/L 70 67 71  Total Bilirubin 0.3 - 1.2 mg/dL 0.5 0.4 0.3    CAD -Cardiac cath showing severe calcified three-vessel  disease, per cardiologist is not a candidate for CABG, so plan for atherectomy/PCI to RCA + PCI to LAD on Friday.  For now, will not intervene on LCx disease.  - Start Crestor 20 mg daily.  - Continue ASA, started on Plavix.   PAD.   -S/p right BKA, CT evidence of abdominal diffuse calcification with recurrent abdominal pain.  Highly questionable for ischemic bowel, for now continue aspirin and statin for secondary prevention, clear liquid diet and monitor.  She likely has diffuse atherosclerosis due to uncontrolled diabetes mellitus and hypertension.   Hypertensive crisis.  -Stopped  IV Clevidipine drip, oral medications adjusted and as needed IV hydralazine added.  Currently on combination of beta-blocker, Norvasc, clonidine, hydralazine along with Imdur.   GERD with nausea.  Also has HX of diabetic gastroparesis.   - For now supportive care with clear liquid diet, PPI and nausea medications.  Dyslipidemia.  - Continue statin.   Anemia of chronic disease.  - Monitor.  Hypertensive urgency.  - Resolved.  Depression and anxiety.   -Continue combination of bupropion, fluoxetine, buspirone and Xanax.  AKI  -at [facility] with creatinine of 1.5.  Renal function worse likely due to combination of IV contrast from CT and diuresis, Lasix was held and she was gently hydrated for 2 days from 10/25/2019 to 10/26/2019 with total of 2 L of normal saline, creatinine has stabilized and now trending down, will commence low-dose Lasix on 10/27/2019 as she is developing crackles and evidence of reoccurrence of pleural effusion.  Liver mass suspicious on CT scan.   - No mass noted on MRI.  Hypomagnesemia and hypokalemia.   -Replaced.  DM2 - placed on Lantus sliding scale along with Premeal NovoLog.  Monitor and adjust  Lab Results  Component Value Date   HGBA1C 6.9 (H) 10/23/2019     CBG (last 3)  Recent Labs    10/31/19 2050 11/01/19 0749 11/01/19 1134  GLUCAP 136* 215* 217*      Condition - Extremely Guarded  Family Communication  :  Daughter in law RN  @ [phone], called on 11/01/2019, unable to leave voicemail as I have full voicemail box  Code Status :  Full  Diet :   Diet Order            Diet clear liquid Room service appropriate? Yes; Fluid consistency: Thin  Diet effective now               Disposition Plan  : Unstable for discharge, she remains with recurrent pleural effusions, plan for cardiac cath today  Consults  : Pulmonary, cardiology  Procedures  :    MRI abdomen.  No suspicious liver lesion.    Ultrasound-guided right-sided thoracentesis on 10/24/2019 with 750 cc of fluid removed.  Done by pulmonary.  Ultrasound guided left-sided thoracentesis on 10/25/2019 with 1300 cc of fluid removed.  Done by pulmonary  Renal ultrasound.  CT - 1. Persistent moderate to large bilateral pleural effusions with overlying atelectasis. 2. No acute abdominal/pelvic findings, mass lesions or adenopathy. 3. Severe/advanced atherosclerotic calcifications involving the aorta and branch vessels for age. 4. Small to moderate amount of persistent free pelvic fluid. No obvious cause. 5. Calcified uterine fibroids. Aortic Atherosclerosis.  TTE from [facility] - EF 60%, dCHF  PICC line and Foley  requested.  PUD Prophylaxis : PPI  DVT Prophylaxis  :  Lovenox    Lab Results  Component Value Date   PLT 198 11/01/2019    Inpatient Medications  Scheduled Meds: . amLODipine  10 mg Oral Daily  . aspirin EC  81 mg Oral Daily  . atorvastatin  80 mg Oral QHS  . busPIRone  5 mg Oral Daily  . Chlorhexidine Gluconate Cloth  6 each Topical Daily  . clonazepam  0.25 mg Oral BID  . cloNIDine  0.3 mg Oral BID  . clopidogrel  75 mg Oral Daily  . FLUoxetine  40 mg Oral Daily  . folic acid  1 mg Oral Daily  . heparin  5,000 Units Subcutaneous Q8H  . hydrALAZINE  100 mg Oral Q8H  . insulin aspart  0-15 Units Subcutaneous TID WC  . insulin aspart  0-5 Units  Subcutaneous QHS  . insulin glargine  30 Units Subcutaneous QHS  . isosorbide mononitrate  60 mg Oral Daily  . labetalol  300 mg Oral BID  . multivitamin with minerals  1 tablet Oral Daily  . pantoprazole  40 mg Oral Daily  . rosuvastatin  20 mg Oral q1800  . sodium chloride flush  3 mL Intravenous Q12H  . sodium chloride flush  3 mL Intravenous Q12H  . spironolactone  25 mg Oral Daily  . thiamine  100 mg Oral Daily   Continuous Infusions: . sodium chloride     PRN Meds:.sodium chloride, acetaminophen, albuterol, ALPRAZolam, hydrALAZINE, HYDROcodone-acetaminophen, ipratropium-albuterol, morphine injection, nitroGLYCERIN, ondansetron (ZOFRAN) IV, sodium chloride flush  Antibiotics  :    Anti-infectives (From admission, onward)   Start     Dose/Rate Route Frequency Ordered Stop   10/23/19 1000  remdesivir 100 mg in sodium chloride 0.9 % 100 mL IVPB     100 mg 200  mL/hr over 30 Minutes Intravenous Daily 10/22/19 1713 10/26/19 1006   10/22/19 1800  remdesivir 200 mg in sodium chloride 0.9% 250 mL IVPB     200 mg 580 mL/hr over 30 Minutes Intravenous Once 10/22/19 1713 10/22/19 1843   10/21/19 2100  cefTRIAXone (ROCEPHIN) 1 g in sodium chloride 0.9 % 100 mL IVPB  Status:  Discontinued     1 g 200 mL/hr over 30 Minutes Intravenous Every 24 hours 10/21/19 1744 10/22/19 0748   10/21/19 2100  azithromycin (ZITHROMAX) tablet 500 mg  Status:  Discontinued     500 mg Oral Daily 10/21/19 1744 10/22/19 0748       Time Spent in minutes  30   Phillips Climes M.D on 11/01/2019 at 3:07 PM  To page go to [email or web address] - password [facility]  [facility] -  Office  [phone]     See all Orders from today for further details    Objective:   Vitals:   11/01/19 1153 11/01/19 1158 11/01/19 1200 11/01/19 1236  BP:  123/62 133/68 (!) 126/55  Pulse: 90 73 73   Resp:  16 14 13   Temp:  98.4 F (36.9 C)    TempSrc:  Oral    SpO2:  100% 100%   Weight:      Height:        Wt  Readings from Last 3 Encounters:  11/01/19 65.1 kg     Intake/Output Summary (Last 24 hours) at 11/01/2019 1507 Last data filed at 11/01/2019 0937 Gross per 24 hour  Intake 1309.54 ml  Output 700 ml  Net 609.54 ml     Physical Exam  Awake Alert, Oriented X 3, No new F.N deficits, anxious Symmetrical Chest wall movement, Good air movement bilaterally, CTAB RRR,No Gallops,Rubs or new Murmurs, No Parasternal Heave +ve B.Sounds, Abd Soft, No tenderness, No rebound - guarding or rigidity. No Cyanosis, Clubbing, right BKA      Data Review:    CBC Recent Labs  Lab 10/28/19 0330 10/28/19 0330 10/29/19 0309 10/29/19 0309 10/30/19 0426 10/31/19 0410 10/31/19 1446 10/31/19 1448 11/01/19 0416  WBC 5.5  --  6.1  --  6.6 6.9  --   --  8.4  HGB 9.1*   < > 8.4*   < > 8.4* 8.7* 8.8* 8.8* 8.4*  HCT 28.9*   < > 27.4*   < > 26.7* 27.7* 26.0* 26.0* 27.2*  PLT 199  --  203  --  222 240  --   --  198  MCV 79.8*  --  80.4  --  80.2 80.1  --   --  81.2  MCH 25.1*  --  24.6*  --  25.2* 25.1*  --   --  25.1*  MCHC 31.5  --  30.7  --  31.5 31.4  --   --  30.9  RDW 13.6  --  13.5  --  13.7 13.8  --   --  13.8  LYMPHSABS 0.7  --  0.6*  --  0.7 0.9  --   --  0.5*  MONOABS 0.5  --  0.5  --  0.5 0.6  --   --  0.6  EOSABS 0.1  --  0.1  --  0.2 0.1  --   --  0.2  BASOSABS 0.0  --  0.0  --  0.0 0.0  --   --  0.0   < > = values in this interval not displayed.    Chemistries  Recent Labs  Lab 10/28/19 0330 10/28/19 0330 10/29/19 0309 10/29/19 0309 10/30/19 0426 10/31/19 0410 10/31/19 1446 10/31/19 1448 11/01/19 0416  NA 136   < > 134*   < > 134* 137 135 136 131*  K 3.8   < > 3.6   < > 3.4* 4.2 4.0 3.9 4.3  CL 102  --  99  --  99 100  --   --  99  CO2 26  --  26  --  26 26  --   --  24  GLUCOSE 201*  --  262*  --  276* 175*  --   --  250*  BUN 40*  --  35*  --  22* 17  --   --  16  CREATININE 1.56*  --  1.82*  --  1.31* 1.11*  --   --  1.03*  CALCIUM 8.5*  --  8.5*  --  8.6* 9.0  --    --  8.3*  MG 1.5*  --  1.9  --  1.6* 2.0  --   --  1.7  AST 18  --  19  --  19 18  --   --  20  ALT 17  --  18  --  19 18  --   --  22  ALKPHOS 52  --  67  --  71 67  --   --  70  BILITOT 0.4  --  0.3  --  0.3 0.4  --   --  0.5   < > = values in this interval not displayed.   ------------------------------------------------------------------------------------------------------------------ Recent Labs    11/01/19 0416  CHOL 113  HDL 26*  LDLCALC 51  TRIG 179*  CHOLHDL 4.3    Lab Results  Component Value Date   HGBA1C 6.9 (H) 10/23/2019   ------------------------------------------------------------------------------------------------------------------ No results for input(s): TSH, T4TOTAL, T3FREE, THYROIDAB in the last 72 hours.  Invalid input(s): FREET3  Cardiac Enzymes No results for input(s): CKMB, TROPONINI, MYOGLOBIN in the last 168 hours.  Invalid input(s): CK ------------------------------------------------------------------------------------------------------------------    Component Value Date/Time   BNP 179.2 (H) 11/01/2019 0416    Micro Results Recent Results (from the past 240 hour(s))  Stat Gram stain     Status: None   Collection Time: 10/24/19 11:14 AM   Specimen: Pleura; Body Fluid  Result Value Ref Range Status   Specimen Description PLEURAL RIGHT  Final   Special Requests NONE  Final   Gram Stain   Final    RARE WBC PRESENT, PREDOMINANTLY MONONUCLEAR NO ORGANISMS SEEN Performed at [facility], [address] [place], [place] [address]    Report Status 10/24/2019 FINAL  Final  Respiratory Panel by PCR     Status: None   Collection Time: 10/25/19  4:18 AM   Specimen: Nasopharyngeal Swab; Respiratory  Result Value Ref Range Status   Adenovirus NOT DETECTED NOT DETECTED Final   Coronavirus 229E NOT DETECTED NOT DETECTED Final    Comment: (NOTE) The Coronavirus on the Respiratory Panel, DOES NOT test for the novel  Coronavirus (2019  nCoV)    Coronavirus HKU1 NOT DETECTED NOT DETECTED Final   Coronavirus NL63 NOT DETECTED NOT DETECTED Final   Coronavirus OC43 NOT DETECTED NOT DETECTED Final   Metapneumovirus NOT DETECTED NOT DETECTED Final   Rhinovirus / Enterovirus NOT DETECTED NOT DETECTED Final   Influenza A NOT DETECTED NOT DETECTED Final   Influenza B NOT DETECTED NOT DETECTED Final  Parainfluenza Virus 1 NOT DETECTED NOT DETECTED Final   Parainfluenza Virus 2 NOT DETECTED NOT DETECTED Final   Parainfluenza Virus 3 NOT DETECTED NOT DETECTED Final   Parainfluenza Virus 4 NOT DETECTED NOT DETECTED Final   Respiratory Syncytial Virus NOT DETECTED NOT DETECTED Final   Bordetella pertussis NOT DETECTED NOT DETECTED Final   Chlamydophila pneumoniae NOT DETECTED NOT DETECTED Final   Mycoplasma pneumoniae NOT DETECTED NOT DETECTED Final    Comment: Performed at [facility], [place] [address], [place], [place] [address]  Body fluid culture (includes gram stain)     Status: None   Collection Time: 10/25/19  4:29 PM   Specimen: Pleural Fluid  Result Value Ref Range Status   Specimen Description FLUID PLEURAL  Final   Special Requests NONE  Final   Gram Stain   Final    FEW WBC PRESENT, PREDOMINANTLY MONONUCLEAR NO ORGANISMS SEEN    Culture   Final    NO GROWTH 3 DAYS Performed at [facility], [place] [address], [place],  [address]    Report Status 10/28/2019 FINAL  Final    Radiology Reports MR ABDOMEN WO CONTRAST  Result Date: 10/28/2019 CLINICAL DATA:  59 year old female with history of liver lesion. Follow-up study. EXAM: MRI ABDOMEN WITHOUT CONTRAST TECHNIQUE: Multiplanar multisequence MR imaging was performed without the administration of intravenous contrast. COMPARISON:  No prior abdominal MRI. CT the abdomen and pelvis 10/22/2019. FINDINGS: Comment: Today's study is limited by lack of IV gadolinium for detection and characterization of visceral and/or vascular lesions. Lower chest: Large  right and moderate left pleural effusions. Signal in the visualize lung bases dependently, nonspecific, but favored to reflect areas of passive atelectasis. Hepatobiliary: No definite suspicious cystic or solid hepatic lesions are confidently identified on today's noncontrast examination. No intra or extrahepatic biliary ductal dilatation. Gallbladder is normal in appearance. Pancreas: No definite pancreatic mass or peripancreatic fluid collections or inflammatory changes noted on today's noncontrast CT examination. Spleen:  Unremarkable. Adrenals/Urinary Tract: Unenhanced appearance of the kidneys and bilateral adrenal glands are normal. Stomach/Bowel: Visualized portions are unremarkable. Vascular/Lymphatic: No aneurysm identified in the visualized abdominal vasculature. No lymphadenopathy noted in the abdomen. Other: No significant volume of ascites noted in the visualized portions of the peritoneal cavity. Musculoskeletal: No aggressive appearing osseous lesions are noted in the visualized portions of the skeleton. Mild diffuse body wall edema. IMPRESSION: 1. The lesion of concern on the prior CT scan is not demonstrated on today's noncontrast MRI examination. This may suggest a benign perfusion anomaly on the prior CT scan. Regardless, this lesion is unlikely to be related to the patient's history of abdominal pain. If there is persistent clinical concern, this lesion could be further evaluated with repeat abdominal MRI with and without IV gadolinium. 2. Large right and moderate left pleural effusions with areas of probable passive atelectasis in the dependent portions of the lung bases bilaterally. 3. Mild diffuse body wall edema. Electronically Signed   By: Vinnie Langton M.D.   On: 10/28/2019 08:17   CT ABDOMEN PELVIS W CONTRAST  Result Date: 10/22/2019 CLINICAL DATA:  Diffuse abdominal pain and anemia. EXAM: CT ABDOMEN AND PELVIS WITH CONTRAST TECHNIQUE: Multidetector CT imaging of the abdomen and  pelvis was performed using the standard protocol following bolus administration of intravenous contrast. CONTRAST:  20m OMNIPAQUE IOHEXOL 300 MG/ML  SOLN COMPARISON:  CT scan 10/19/2019 FINDINGS: Lower chest: Persistent moderate to large bilateral pleural effusions with significant overlying atelectasis. The heart is  normal in size. No pericardial effusion. Age advanced coronary artery calcifications are noted. Hepatobiliary: No focal hepatic lesions or intrahepatic biliary dilatation. Ill-defined area of contrast enhancement and segment 7 likely a vascular shunt. The gallbladder is mildly contracted. No common bile duct dilatation. Pancreas: Scattered calcifications or likely due to prior inflammation. No worrisome pancreatic lesions or acute inflammatory process. No ductal dilatation. Spleen: Normal size. No focal lesions. Adrenals/Urinary Tract: The adrenal glands and kidneys are unremarkable. No worrisome renal lesions or hydronephrosis. The bladder appears normal. Stomach/Bowel: The stomach, duodenum, small bowel and colon are grossly normal without oral contrast. No acute inflammatory changes, mass lesions or obstructive findings. Vascular/Lymphatic: Severe/advanced atherosclerotic calcifications involving the aorta and branch vessels for age. No aneurysm or dissection. The major venous structures are patent. No mesenteric or retroperitoneal mass or adenopathy. Reproductive: The uterus and ovaries are unremarkable. Calcified fibroid again noted posteriorly. Other: Small to moderate amount of free pelvic fluid is again demonstrated. No obvious cause. Musculoskeletal: Stable compression fracture of L1. No worrisome bone lesions. Age advanced osteoporosis. IMPRESSION: 1. Persistent moderate to large bilateral pleural effusions with overlying atelectasis. 2. No acute abdominal/pelvic findings, mass lesions or adenopathy. 3. Severe/advanced atherosclerotic calcifications involving the aorta and branch vessels for  age. 4. Small to moderate amount of persistent free pelvic fluid. No obvious cause. 5. Calcified uterine fibroids. Aortic Atherosclerosis (ICD10-I70.0). Electronically Signed   By: Marijo Sanes M.D.   On: 10/22/2019 13:56   CARDIAC CATHETERIZATION  Result Date: 10/31/2019 1. Optimized filling pressures after diuresis. 2. Successful Cardiomems placement. 3. Severe 3 vessel disease. Anatomy is best-suited to CABG, but patient is s/p BKA and does not have a prosthesis yet (not mobile).  She would have a hard time successfully rehabing after a cardiac surgery. Discussed with interventional team, will tentatively plan intervention on RCA and LAD.  The RCA will require atherectomy.  Will aim for Friday, will load Plavix prior.   US RENAL  Result Date: 10/25/2019 CLINICAL DATA:  Acute renal failure EXAM: RENAL / URINARY TRACT ULTRASOUND COMPLETE COMPARISON:  None. FINDINGS: Right Kidney: Renal measurements: 13.5 x 5.7 x 4.7 cm. = volume: 188 mL . Echogenicity within normal limits. No mass or hydronephrosis visualized. Left Kidney: Renal measurements: 14.2 x 4.9 x 4.3 cm = volume: 157 mL. Echogenicity within normal limits. No mass or hydronephrosis visualized. Bladder: Decompressed by Foley catheter. Other: None. IMPRESSION: No acute abnormality noted. Electronically Signed   By: Inez Catalina M.D.   On: 10/25/2019 22:11   DG Chest Port 1 View  Result Date: 10/31/2019 CLINICAL DATA:  Shortness of breath. EXAM: PORTABLE CHEST 1 VIEW COMPARISON:  10/28/2019 FINDINGS: The right PICC line is stable. The cardiac silhouette, mediastinal and hilar contours are within normal limits and unchanged. Overall improved lung aeration with resolving edema, atelectasis and effusions. IMPRESSION: Improving lung aeration with resolving edema, atelectasis and effusions. Electronically Signed   By: Marijo Sanes M.D.   On: 10/31/2019 09:19   DG Chest Port 1 View  Result Date: 10/28/2019 CLINICAL DATA:  Shortness of breath, COVID  positive EXAM: PORTABLE CHEST 1 VIEW COMPARISON:  10/26/2019 FINDINGS: Increased density at the lung bases. No pneumothorax. Stable cardiomediastinal contours. Right PICC line is again noted. IMPRESSION: Increased density at the lung bases probably reflecting a combination of pleural effusion and atelectasis. Electronically Signed   By: Macy Mis M.D.   On: 10/28/2019 07:50   DG Chest Port 1 View  Result Date: 10/26/2019 CLINICAL DATA:  Shortness of  breath, COVID positive EXAM: PORTABLE CHEST 1 VIEW COMPARISON:  10/25/2019 FINDINGS: Right-sided PICC line terminates in the distal superior vena cava. Heart size is stable and normal accounting for projection. Increasing opacity at the left lung base without silhouetting of left hemidiaphragm. Subtle opacities are seen on a background of mild interstitial prominence worse in the left lung base, also present in right mid chest and lower chest. Visualized skeletal structures are unremarkable. IMPRESSION: 1. Increasing opacity at the left lung base without silhouetting of the left hemidiaphragm. Findings may represent developing effusion. 2. Subtle opacities in the right mid chest and lower chest. Findings could be seen in the setting of COVID-19 pneumonia. Continued follow-up suggested. Electronically Signed   By: Zetta Bills M.D.   On: 10/26/2019 08:59   DG Chest Port 1 View  Result Date: 10/25/2019 CLINICAL DATA:  Status post thoracentesis. EXAM: PORTABLE CHEST 1 VIEW COMPARISON:  Radiograph earlier this day. Lung bases from abdominal CT 10/22/2019 FINDINGS: Decreased left pleural effusion. No visualized pneumothorax. Hazy opacity at the right lung base likely small right pleural effusion and atelectasis. Right upper extremity PICC remains in place. Borderline cardiomegaly with unchanged mediastinal contours. Improved pulmonary edema from earlier this day. Bones are under mineralized. IMPRESSION: 1. Decreased left pleural effusion after thoracentesis. No  visualized pneumothorax. 2. Hazy opacity at the right lung base likely small right pleural effusion and atelectasis. 3. Improving pulmonary edema. Electronically Signed   By: Keith Rake M.D.   On: 10/25/2019 17:17   DG Chest Port 1 View  Result Date: 10/25/2019 CLINICAL DATA:  Status post thoracentesis. COVID positive. EXAM: PORTABLE CHEST 1 VIEW COMPARISON:  One-view chest x-ray 10/23/19 FINDINGS: Heart size is normal. Right-sided PICC line terminates in the distal SVC. Left pleural effusion and airspace disease is improved. No pneumothorax is present. Mild edema is present. IMPRESSION: 1. Improving left pleural effusion and airspace disease. 2. No pneumothorax. 3. Mild edema. Electronically Signed   By: San Morelle M.D.   On: 10/25/2019 06:57   DG CHEST PORT 1 VIEW  Result Date: 10/23/2019 CLINICAL DATA:  Acute respiratory insufficiency EXAM: PORTABLE CHEST 1 VIEW COMPARISON:  October 22, 2019 FINDINGS: The heart size and mediastinal contours are unchanged with mild cardiomegaly. There is a small left pleural effusion. There is slight interval improvement in the hazy/interstitial markings throughout both lungs. No acute osseous abnormality. IMPRESSION: Slight interval improvement in the bilateral hazy airspace opacities which could be due to pulmonary edema or infectious etiology. Small left pleural effusion. Electronically Signed   By: Prudencio Pair M.D.   On: 10/23/2019 06:32   DG Chest Port 1 View  Result Date: 10/22/2019 CLINICAL DATA:  CHF respiratory distress.  COVID-19 positive EXAM: PORTABLE CHEST 1 VIEW COMPARISON:  10/20/2019 FINDINGS: Diffuse bilateral airspace disease with basilar predominance shows mild progression. Moderate left effusion has improved in the interval. Probable small right effusion. IMPRESSION: Mild progression of diffuse bilateral airspace disease which may represent edema or pneumonia. Improvement in left pleural effusion. Electronically Signed   By: Franchot Gallo  M.D.   On: 10/22/2019 09:28   ECHOCARDIOGRAM COMPLETE  Result Date: 10/23/2019    ECHOCARDIOGRAM REPORT   Patient Name:   Cherilyn Burford  Date of Exam: 10/23/2019 Medical Rec #:  [id number]  Height:       63.0 in Accession #:    [id number] Weight:       156.5 lb Date of Birth:  03/20/1961 BSA:  1.742 m Patient Age:    75 years   BP:           124/65 mmHg Patient Gender: F          HR:           66 bpm. Exam Location:  Inpatient Procedure: 2D Echo, Cardiac Doppler and Color Doppler Indications:    I50.33 Acute on chronic diastolic (congestive) heart failure  History:        Patient has no prior history of Echocardiogram examinations.                 COPD, TIA and PAD; Risk Factors:Hypertension and Diabetes.                 COVID-19 Positive.  Sonographer:    Jonelle Sidle Dance Referring Phys: [place]  1. Left ventricular ejection fraction, by estimation, is 55 to 60%. The left ventricle has normal function. The left ventricle has no regional wall motion abnormalities. Left ventricular diastolic parameters are consistent with Grade II diastolic dysfunction (pseudonormalization). Elevated left atrial pressure.  2. Right ventricular systolic function is normal. The right ventricular size is normal. Tricuspid regurgitation signal is inadequate for assessing PA pressure.  3. Left atrial size was moderately dilated.  4. Large pleural effusion in the left lateral region.  5. The mitral valve is normal in structure and function. Mild mitral valve regurgitation.  6. The aortic valve is normal in structure and function. Aortic valve regurgitation is not visualized.  7. The inferior vena cava is dilated in size with >50% respiratory variability, suggesting right atrial pressure of 8 mmHg. Comparison(s): No prior Echocardiogram. FINDINGS  Left Ventricle: Left ventricular ejection fraction, by estimation, is 55 to 60%. The left ventricle has normal function. The left ventricle has no regional wall motion  abnormalities. The left ventricular internal cavity size was normal in size. There is  no left ventricular hypertrophy. Left ventricular diastolic parameters are consistent with Grade II diastolic dysfunction (pseudonormalization). Elevated left atrial pressure. Right Ventricle: The right ventricular size is normal. No increase in right ventricular wall thickness. Right ventricular systolic function is normal. Tricuspid regurgitation signal is inadequate for assessing PA pressure. Left Atrium: Left atrial size was moderately dilated. Right Atrium: Right atrial size was normal in size. Pericardium: There is no evidence of pericardial effusion. Mitral Valve: The mitral valve is normal in structure and function. Mild mitral annular calcification. Mild mitral valve regurgitation. Tricuspid Valve: The tricuspid valve is normal in structure. Tricuspid valve regurgitation is not demonstrated. Aortic Valve: The aortic valve is normal in structure and function. Aortic valve regurgitation is not visualized. Pulmonic Valve: The pulmonic valve was grossly normal. Pulmonic valve regurgitation is not visualized. Aorta: The aortic root and ascending aorta are structurally normal, with no evidence of dilitation. Venous: The inferior vena cava is dilated in size with greater than 50% respiratory variability, suggesting right atrial pressure of 8 mmHg. IAS/Shunts: No atrial level shunt detected by color flow Doppler. Additional Comments: There is a large pleural effusion in the left lateral region.  LEFT VENTRICLE PLAX 2D LVIDd:         3.79 cm  Diastology LVIDs:         2.87 cm  LV e' lateral:   6.00 cm/s LV PW:         1.27 cm  LV E/e' lateral: 18.3 LV IVS:        0.94 cm  LV e' medial:  3.57 cm/s LVOT diam:     1.80 cm  LV E/e' medial:  30.8 LV SV:         52 LV SV Index:   30 LVOT Area:     2.54 cm  RIGHT VENTRICLE            IVC RV Basal diam:  3.24 cm    IVC diam: 2.47 cm RV Mid diam:    1.67 cm RV S prime:     9.32 cm/s  TAPSE (M-mode): 1.8 cm LEFT ATRIUM             Index       RIGHT ATRIUM           Index LA diam:        3.90 cm 2.24 cm/m  RA Area:     15.50 cm LA Vol (A2C):   79.2 ml 45.46 ml/m RA Volume:   42.40 ml  24.33 ml/m LA Vol (A4C):   61.2 ml 35.12 ml/m LA Biplane Vol: 73.7 ml 42.30 ml/m  AORTIC VALVE LVOT Vmax:   89.80 cm/s LVOT Vmean:  62.000 cm/s LVOT VTI:    0.204 m  AORTA Ao Root diam: 2.80 cm Ao Asc diam:  2.40 cm MITRAL VALVE MV Area (PHT): 3.72 cm     SHUNTS MV Decel Time: 204 msec     Systemic VTI:  0.20 m MV E velocity: 110.00 cm/s  Systemic Diam: 1.80 cm MV A velocity: 54.30 cm/s MV E/A ratio:  2.03 Mihai Croitoru MD Electronically signed by Sanda Klein MD Signature Date/Time: 10/23/2019/3:50:43 PM    Final    [place] EKG SITE RITE  Result Date: 10/22/2019 If Site Rite image not attached, placement could not be confirmed due to current cardiac rhythm.  US Abdomen Limited RUQ  Result Date: 10/22/2019 CLINICAL DATA:  Acute right upper quadrant abdominal pain. EXAM: ULTRASOUND ABDOMEN LIMITED RIGHT UPPER QUADRANT COMPARISON:  October 19, 2019. FINDINGS: Gallbladder: No gallstones or wall thickening visualized. No sonographic Murphy sign noted by sonographer. Common bile duct: Diameter: 5 mm which is within normal limits. Liver: No focal lesion identified. Within normal limits in parenchymal echogenicity. Portal vein is patent on color Doppler imaging with normal direction of blood flow towards the liver. Other: Minimal ascites is noted. IMPRESSION: Minimal ascites. No other abnormality seen in the right upper quadrant of the abdomen. Electronically Signed   By: Marijo Conception M.D.   On: 10/22/2019 09:03

## 2019-11-01 NOTE — H&P (View-Only) (Signed)
Patient ID: Jeriyah Granlund, female   DOB: 05-Jan-1961, 59 y.o.   MRN: [id number]     Advanced Heart Failure Rounding Note  PCP-Cardiologist: No primary care provider on file.   Subjective:    Patient tested positive for COVID-19. Oxygen has been titrated down to 2L [place].  No dyspnea at rest, no chest pain.     She has had right and left thoracenteses now, transudates.   Creatinine down to 1.03 today.   RHC/LHC + Cardiomems placement. Coronary Findings  Diagnostic Dominance: Right Left Main  Mild 20% distal narrowing.  Left Anterior Descending  30% stenosis proximal LAD prior to D1. 50% stenosis LAD at D1. 80-90% stenosis mid LAD. 75% proximal D1 stenosis, moderate-large vessel.  Left Circumflex  Long 70% stenosis ostial to proximal LCx. Tiny OM1 with diffuse severe disease. Small OM2 with 99% proximal stenosis. 60% stenosis proximal PLOM.  Right Coronary Artery  Long area of calcified plaque from the proximal into the mid RCA. 90% stenosis proximally, 80% stenosis in the mid RCA. 80% stenosis mid PDA. 70% stenosis mid PLV.  Intervention  No interventions have been documented. Right Heart  Right Heart Pressures RHC Procedural Findings: Hemodynamics (mmHg) RA mean 2 RV 29/4 PA 27/10, mean 16 PCWP mean 13 LV 116/8 AO 113/53  Oxygen saturations: PA 69% AO 100%  Cardiac Output (Fick) 6.2  Cardiac Index (Fick) 3.63      Objective:   Weight Range: 65.1 kg Body mass index is 25.42 kg/m.   Vital Signs:   Temp:  [97.5 F (36.4 C)-98.9 F (37.2 C)] 98.4 F (36.9 C) (03/11 1158) Pulse Rate:  [63-93] 73 (03/11 1200) Resp:  [8-21] 13 (03/11 1236) BP: (101-151)/(54-101) 126/55 (03/11 1236) SpO2:  [98 %-100 %] 100 % (03/11 1200) Weight:  [65.1 kg] 65.1 kg (03/11 0536) Last BM Date: 10/29/19  Weight change: Filed Weights   10/28/19 0512 10/29/19 0646 11/01/19 0536  Weight: 68.5 kg 68.3 kg 65.1 kg    Intake/Output:   Intake/Output Summary (Last 24 hours) at  11/01/2019 1335 Last data filed at 11/01/2019 0937 Gross per 24 hour  Intake 1309.54 ml  Output 700 ml  Net 609.54 ml      Physical Exam    General: NAD Neck: No JVD, no thyromegaly or thyroid nodule.  Lungs: Clear to auscultation bilaterally with normal respiratory effort. CV: Nondisplaced PMI.  Heart regular S1/S2, no S3/S4, no murmur.  No peripheral edema.   Abdomen: Soft, nontender, no hepatosplenomegaly, no distention.  Skin: Intact without lesions or rashes.  Neurologic: Alert and oriented x 3.  Psych: Normal affect. Extremities: No clubbing or cyanosis. S/p R BKA.  HEENT: Normal.    Telemetry   NSR 70s, personally reviewed  Labs    CBC Recent Labs    10/31/19 0410 10/31/19 1446 10/31/19 1448 11/01/19 0416  WBC 6.9  --   --  8.4  NEUTROABS 5.1  --   --  7.1  HGB 8.7*   < > 8.8* 8.4*  HCT 27.7*   < > 26.0* 27.2*  MCV 80.1  --   --  81.2  PLT 240  --   --  198   < > = values in this interval not displayed.   Basic Metabolic Panel Recent Labs    10/31/19 0410 10/31/19 1446 10/31/19 1448 11/01/19 0416  NA 137   < > 136 131*  K 4.2   < > 3.9 4.3  CL 100  --   --  99  CO2 26  --   --  24  GLUCOSE 175*  --   --  250*  BUN 17  --   --  16  CREATININE 1.11*  --   --  1.03*  CALCIUM 9.0  --   --  8.3*  MG 2.0  --   --  1.7   < > = values in this interval not displayed.   Liver Function Tests Recent Labs    10/31/19 0410 11/01/19 0416  AST 18 20  ALT 18 22  ALKPHOS 67 70  BILITOT 0.4 0.5  PROT 5.6* 5.3*  ALBUMIN 2.4* 2.4*   No results for input(s): LIPASE, AMYLASE in the last 72 hours. Cardiac Enzymes No results for input(s): CKTOTAL, CKMB, CKMBINDEX, TROPONINI in the last 72 hours.  BNP: BNP (last 3 results) Recent Labs    10/30/19 0427 10/31/19 0410 11/01/19 0416  BNP 361.3* 346.7* 179.2*    ProBNP (last 3 results) No results for input(s): PROBNP in the last 8760 hours.   D-Dimer Recent Labs    10/30/19 0426  DDIMER 3.82*    Hemoglobin A1C No results for input(s): HGBA1C in the last 72 hours. Fasting Lipid Panel Recent Labs    11/01/19 0416  CHOL 113  HDL 26*  LDLCALC 51  TRIG 179*  CHOLHDL 4.3   Thyroid Function Tests No results for input(s): TSH, T4TOTAL, T3FREE, THYROIDAB in the last 72 hours.  Invalid input(s): FREET3  Other results:   Imaging    CARDIAC CATHETERIZATION  Result Date: 10/31/2019 1. Optimized filling pressures after diuresis. 2. Successful Cardiomems placement. 3. Severe 3 vessel disease. Anatomy is best-suited to CABG, but patient is s/p BKA and does not have a prosthesis yet (not mobile).  She would have a hard time successfully rehabing after a cardiac surgery. Discussed with interventional team, will tentatively plan intervention on RCA and LAD.  The RCA will require atherectomy.  Will aim for Friday, will load Plavix prior.     Medications:     Scheduled Medications: . amLODipine  10 mg Oral Daily  . aspirin EC  81 mg Oral Daily  . atorvastatin  80 mg Oral QHS  . busPIRone  5 mg Oral Daily  . Chlorhexidine Gluconate Cloth  6 each Topical Daily  . clonazepam  0.25 mg Oral BID  . cloNIDine  0.3 mg Oral BID  . clopidogrel  75 mg Oral Daily  . FLUoxetine  40 mg Oral Daily  . folic acid  1 mg Oral Daily  . furosemide  60 mg Oral Daily  . heparin  5,000 Units Subcutaneous Q8H  . hydrALAZINE  100 mg Oral Q8H  . insulin aspart  0-15 Units Subcutaneous TID WC  . insulin aspart  0-5 Units Subcutaneous QHS  . insulin glargine  30 Units Subcutaneous QHS  . isosorbide mononitrate  60 mg Oral Daily  . labetalol  300 mg Oral BID  . multivitamin with minerals  1 tablet Oral Daily  . pantoprazole  40 mg Oral Daily  . rosuvastatin  20 mg Oral q1800  . sodium chloride flush  3 mL Intravenous Q12H  . spironolactone  25 mg Oral Daily  . thiamine  100 mg Oral Daily    Infusions: . sodium chloride      PRN Medications: sodium chloride, acetaminophen, albuterol,  ALPRAZolam, hydrALAZINE, HYDROcodone-acetaminophen, ipratropium-albuterol, morphine injection, nitroGLYCERIN, ondansetron (ZOFRAN) IV, sodium chloride flush   Assessment/Plan   1. Acute hypoxemic respiratory failure: COVID-19 viral  pneumonia + CHF exacerbation.  Weight is down with diuresis, titrating down oxygen. - Completed steroids and remdesivir for COVID-19.  - Filling pressures optimized on RHC, now on po Lasix.  2. Acute on chronic diastolic CHF: Multiple admissions over the last 6 months with hypertensive urgency/diastolic CHF.  Echo at [facility] 2/21 with EF 55-60%, restrictive diastolic function.  Echo here with EF 55-60%, normal RV, IVC suggesting RA pressure 8 mmHg. Creatinine 1.03 today, lower.  RHC 3/10 showed optimized filling pressures.  With multiple recent diastolic CHF admissions, we placed a Cardiomems device.  - Hold Lasix tomorrow morning for PCI, restart Lasix at 60 mg daily afterwards.  3. Pleural effusions: Bilateral.  Had thoracenteses at [facility] and again bilaterally here. Transudates with negative cytology. Suspect due to CHF.   4. HTN: Poorly controlled at home, admitted with hypertensive emergency/CHF.  HTN slowly getting under better control.  - Continue amlodipine 10 mg daily.  - Continue hydralazine 100 mg tid. - Continue labetalol 300 bid - Continue clonidine 0.3 bid.   - Continue spironolactone 25 mg daily.  - Hold off on ACEI/ARB with AKI.  - Check renal artery dopplers to rule out renal artery stenosis.  5. AKI: Creatinine to 2.4 with diuresis, now 1.03.   6. Abdominal pain: CT abdomen earlier in admission with 1.5 cm liver lesion but no other significant abdominal abnormality.  She has had abdominal pain throughout this admission.  CT abdomen 3/1 showed no acute abdominal findings.  7. COPD: Per report on 3L home oxygen, now down to 2L and probably could continue to lower.  8. DM: SSI, per [facility].  9. CAD: Coronary angiography today showed severe, calcified  3 vessel disease.  This likely plays a role in her multiple episodes of flash pulmonary edema.  Ideally, based on this anatomy she would have CABG.  However, she has had right BKA and does not have a prosthesis.  She is currently non-ambulatory.  She would not be able to rehab effectively after CABG.  - I reviewed her films with interventional colleagues.  Will plan atherectomy/PCI to RCA + PCI to LAD on Friday.  For now, will not intervene on LCx disease.  - Start Crestor 20 mg daily.  - Continue ASA, started Plavix.   Length of Stay: 59  Loralie Champagne, MD  11/01/2019, 1:35 PM  Advanced Heart Failure Team Pager [phone] (M-F; [place])  Please contact [facility] for night-coverage after hours (4p -7a ) and weekends on [email or web address]

## 2019-11-01 NOTE — Progress Notes (Signed)
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  She was recently hospitalized twice to [facility] for shortness of breath, her work-up showed bilateral transudative pleural effusion, she had thoracentesis suggesting transudative fluid with no malignant cells.  Reason for recurrent effusion was thought to be acute on chronic diastolic CHF.  She was also found to have liver mass.  She was finally transferred to [facility] for cardiology evaluation for recurrent CHF.  Also positive for Covid.  (patient transferred out of ICU to [facility] 10/24/19). 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     PT Comments    Nurse cleared patient to participate in PT session and confirmed no precautions after cardiac cath yesterday. Patient with OOB orders after cardiac cath. She is already OOB in [place] chair upon PT and OT entrance. Cotx with OT to progress mobility to standing with RW. Patient with L lean, legs crossed in recliner chair, poor positioning. After one trial of standing with RW with modA x 2 patient noting pain and tightness, putting her hand on her chest but would not say where she was experiencing these symptoms or if they were getting better or worse, when they came on. Nurse informed and in to assess patient. Nurse to inform MD. Assisted patient with improved positioning in chair, vitals WNL.  During session, patient expressing  frustration with her medical prognosis, rehab process, and all she has been through in the last year. Support provided. It appears patient has been in and out of hospitals and rehabs since on or before April 2020.     Follow Up Recommendations  SNF;Supervision/Assistance - 24 hour     Equipment Recommendations  3in1 (PT);Rolling walker with 5" wheels;Wheelchair (measurements PT);Wheelchair cushion (measurements PT);Hospital bed    Recommendations for Other Services       Precautions / Restrictions Precautions Precautions: Fall Precaution Comments: R BKA April 2020 Required Braces or Orthoses: Other Brace(prosthesis- at rehab facility, too small)    Mobility  Bed Mobility    General bed mobility comments: No precautions after yesterday's cardiac cath, confirmed with RN. Patient already OOB in chair  Transfers Overall transfer level: Needs assistance Equipment used: Rolling walker (2 wheeled) Transfers: Sit to/from Stand Sit to Stand: Mod assist;+2 physical assistance         General transfer comment: sit<>stand from recliner chair with modA x 2 and RW, block to LLE to prevent buckling if needed. L foot anterior to COM. Patient forward flexed.          Balance Overall balance assessment: Needs assistance         Standing balance support: Bilateral upper extremity supported Standing balance-Leahy Scale: Poor Standing balance comment: Standing tolerance approx 10 seconds with assist, unable to lift chest fully for upright posture.      Cognition Arousal/Alertness: Awake/alert         General Comments General comments (skin integrity, edema, etc.): Patient on 2L oxygen via [place]. Oxygen saturation 99%, HR 75 bpm, RR  14 post stand, BP 124/66. Patient noting tightness and would not describe where just putting hand on chest. RN informed and in to assess patient. (Assisted patient with repositioning in chair)      Pertinent Vitals/Pain Pain Assessment: 0-10 Pain Score:  5  Pain Location: patient touching chest but would not say where Pain Descriptors / Indicators: Tightness((non radiating)) Pain Intervention(s): Limited activity within patient's tolerance;Monitored during session;Repositioned;Utilized relaxation techniques(RN informed, RN to page MD)           PT Goals (current goals can now be found in the care plan section) Progress towards PT goals: Progressing toward goals    Frequency    Min 2X/week      PT Plan Current plan remains appropriate    Co-evaluation PT/OT/SLP Co-Evaluation/Treatment: Yes Reason for Co-Treatment: Complexity of the patient's impairments (multi-system involvement);Necessary to address cognition/behavior during functional activity;For patient/therapist safety          AM-PAC PT "6 Clicks" Mobility   Outcome Measure  Help needed turning from your back to your side while in a flat bed without using bedrails?: A Little Help needed moving from lying on your back to sitting on the side of a flat bed without using bedrails?: A Little Help needed moving to and from a bed to a chair (including a wheelchair)?: A Lot Help needed standing up from a chair using your arms (e.g., wheelchair or bedside chair)?: A Lot Help needed to walk in hospital room?: Total Help needed climbing 3-5 steps with a railing? : Total 6 Click Score: 12    End of Session Equipment Utilized During Treatment: Gait belt;Oxygen Activity Tolerance: Patient limited by fatigue;Patient limited by pain Patient left: in chair;with call bell/phone within reach Nurse Communication: Mobility status;Precautions(patient's complaint of pain/tightness) PT Visit Diagnosis: Muscle weakness (generalized) (M62.81)     Time: 7517-0017 PT Time Calculation (min) (ACUTE ONLY): 25 min  Charges:  $Therapeutic Activity: 8-22 mins                     Birdie Hopes, DPT, PT Acute Rehab [phone] office    Birdie Hopes 11/01/2019, 12:41 PM

## 2019-11-01 NOTE — Progress Notes (Addendum)
Patient alert and oriented, doesn't want staff to call any family member for updates.

## 2019-11-02 ENCOUNTER — Inpatient Hospital Stay (HOSPITAL_COMMUNITY): Payer: Medicaid Other

## 2019-11-02 ENCOUNTER — Encounter (HOSPITAL_COMMUNITY)
Admission: AD | Disposition: A | Discharge status: Skilled Nursing Facility | Payer: Self-pay | Source: Other Acute Inpatient Hospital | Attending: Internal Medicine

## 2019-11-02 DIAGNOSIS — I2510 Atherosclerotic heart disease of native coronary artery without angina pectoris: Secondary | ICD-10-CM

## 2019-11-02 HISTORY — PX: CORONARY ATHERECTOMY: CATH118238

## 2019-11-02 HISTORY — PX: CORONARY STENT INTERVENTION: CATH118234

## 2019-11-02 LAB — CBC WITH DIFFERENTIAL/PLATELET
Abs Immature Granulocytes: 0.06 10*3/uL (ref 0.00–0.07)
Basophils Absolute: 0 10*3/uL (ref 0.0–0.1)
Basophils Relative: 1 %
Eosinophils Absolute: 0.2 10*3/uL (ref 0.0–0.5)
Eosinophils Relative: 2 %
HCT: 25.9 % — ABNORMAL LOW (ref 36.0–46.0)
Hemoglobin: 8.1 g/dL — ABNORMAL LOW (ref 12.0–15.0)
Immature Granulocytes: 1 %
Lymphocytes Relative: 9 %
Lymphs Abs: 0.7 10*3/uL (ref 0.7–4.0)
MCH: 24.9 pg — ABNORMAL LOW (ref 26.0–34.0)
MCHC: 31.3 g/dL (ref 30.0–36.0)
MCV: 79.7 fL — ABNORMAL LOW (ref 80.0–100.0)
Monocytes Absolute: 0.7 10*3/uL (ref 0.1–1.0)
Monocytes Relative: 9 %
Neutro Abs: 5.6 10*3/uL (ref 1.7–7.7)
Neutrophils Relative %: 78 %
Platelets: 209 10*3/uL (ref 150–400)
RBC: 3.25 MIL/uL — ABNORMAL LOW (ref 3.87–5.11)
RDW: 13.7 % (ref 11.5–15.5)
WBC: 7.2 10*3/uL (ref 4.0–10.5)
nRBC: 0 % (ref 0.0–0.2)

## 2019-11-02 LAB — COMPREHENSIVE METABOLIC PANEL
ALT: 22 U/L (ref 0–44)
AST: 18 U/L (ref 15–41)
Albumin: 2.4 g/dL — ABNORMAL LOW (ref 3.5–5.0)
Alkaline Phosphatase: 69 U/L (ref 38–126)
Anion gap: 10 (ref 5–15)
BUN: 13 mg/dL (ref 6–20)
CO2: 24 mmol/L (ref 22–32)
Calcium: 8.5 mg/dL — ABNORMAL LOW (ref 8.9–10.3)
Chloride: 101 mmol/L (ref 98–111)
Creatinine, Ser: 1.09 mg/dL — ABNORMAL HIGH (ref 0.44–1.00)
GFR calc Af Amer: >60 mL/min (ref >60)
GFR calc non Af Amer: 56 mL/min — ABNORMAL LOW (ref >60)
Glucose, Bld: 148 mg/dL — ABNORMAL HIGH (ref 70–99)
Potassium: 3.7 mmol/L (ref 3.5–5.1)
Sodium: 135 mmol/L (ref 135–145)
Total Bilirubin: 0.3 mg/dL (ref 0.3–1.2)
Total Protein: 5.5 g/dL — ABNORMAL LOW (ref 6.5–8.1)

## 2019-11-02 LAB — POCT ACTIVATED CLOTTING TIME
Activated Clotting Time: 307 seconds
Activated Clotting Time: 290 seconds
Activated Clotting Time: 345 seconds
Activated Clotting Time: 241 seconds

## 2019-11-02 LAB — GLUCOSE, CAPILLARY
Glucose-Capillary: 135 mg/dL — ABNORMAL HIGH (ref 70–99)
Glucose-Capillary: 29 mg/dL — CL (ref 70–99)
Glucose-Capillary: 150 mg/dL — ABNORMAL HIGH (ref 70–99)
Glucose-Capillary: 120 mg/dL — ABNORMAL HIGH (ref 70–99)

## 2019-11-02 LAB — MAGNESIUM: Magnesium: 1.6 mg/dL — ABNORMAL LOW (ref 1.7–2.4)

## 2019-11-02 LAB — BRAIN NATRIURETIC PEPTIDE: B Natriuretic Peptide: 173 pg/mL — ABNORMAL HIGH (ref 0.0–100.0)

## 2019-11-02 LAB — C-REACTIVE PROTEIN: CRP: 1 mg/dL — ABNORMAL HIGH (ref <1.0)

## 2019-11-02 SURGERY — CORONARY ATHERECTOMY
Anesthesia: LOCAL

## 2019-11-02 MED ORDER — HEPARIN (PORCINE) IN NACL 1000-0.9 UT/500ML-% IV SOLN
INTRAVENOUS | Status: AC
  Filled 2019-11-02: qty 1000

## 2019-11-02 MED ORDER — MIDAZOLAM HCL 2 MG/2ML IJ SOLN
INTRAMUSCULAR | Status: DC | PRN
  Administered 2019-11-02: 2 mg via INTRAVENOUS

## 2019-11-02 MED ORDER — INSULIN ASPART 100 UNIT/ML ~~LOC~~ SOLN
0.0000 [IU] | SUBCUTANEOUS | Status: DC
  Administered 2019-11-03: 5 [IU] via SUBCUTANEOUS
  Administered 2019-11-03: 8 [IU] via SUBCUTANEOUS

## 2019-11-02 MED ORDER — DEXTROSE 50 % IV SOLN
INTRAVENOUS | Status: AC
  Administered 2019-11-02: 50 mL
  Filled 2019-11-02: qty 50

## 2019-11-02 MED ORDER — SODIUM CHLORIDE 0.9% FLUSH
3.0000 mL | Freq: Two times a day (BID) | INTRAVENOUS | Status: DC
  Administered 2019-11-04: 3 mL via INTRAVENOUS
  Administered 2019-11-04: 10 mL via INTRAVENOUS
  Administered 2019-11-05: 3 mL via INTRAVENOUS

## 2019-11-02 MED ORDER — ONDANSETRON HCL 4 MG/2ML IJ SOLN
4.0000 mg | Freq: Four times a day (QID) | INTRAMUSCULAR | Status: DC | PRN
  Administered 2019-11-02: 4 mg via INTRAVENOUS
  Filled 2019-11-02: qty 2

## 2019-11-02 MED ORDER — SODIUM CHLORIDE 0.9 % IV SOLN: 250.0000 mL | INTRAVENOUS | Status: DC | PRN

## 2019-11-02 MED ORDER — SODIUM CHLORIDE 0.9% FLUSH: 3.0000 mL | INTRAVENOUS | Status: DC | PRN

## 2019-11-02 MED ORDER — NITROGLYCERIN 1 MG/10 ML FOR IR/CATH LAB
INTRA_ARTERIAL | Status: DC | PRN
  Administered 2019-11-02 (×3): 200 ug via INTRACORONARY

## 2019-11-02 MED ORDER — NITROGLYCERIN 1 MG/10 ML FOR IR/CATH LAB
INTRA_ARTERIAL | Status: AC
  Filled 2019-11-02: qty 10

## 2019-11-02 MED ORDER — VIPERSLIDE LUBRICANT OPTIME: TOPICAL | Status: DC | PRN

## 2019-11-02 MED ORDER — FENTANYL CITRATE (PF) 100 MCG/2ML IJ SOLN
INTRAMUSCULAR | Status: DC | PRN
  Administered 2019-11-02: 25 ug via INTRAVENOUS

## 2019-11-02 MED ORDER — ACETAMINOPHEN 325 MG PO TABS
650.0000 mg | ORAL_TABLET | ORAL | Status: DC | PRN
  Administered 2019-11-04: 650 mg via ORAL
  Filled 2019-11-02: qty 2

## 2019-11-02 MED ORDER — FENTANYL CITRATE (PF) 100 MCG/2ML IJ SOLN
INTRAMUSCULAR | Status: AC
  Filled 2019-11-02: qty 2

## 2019-11-02 MED ORDER — IOHEXOL 350 MG/ML SOLN
INTRAVENOUS | Status: AC
  Filled 2019-11-02: qty 1

## 2019-11-02 MED ORDER — HEPARIN SODIUM (PORCINE) 1000 UNIT/ML IJ SOLN
INTRAMUSCULAR | Status: DC | PRN
  Administered 2019-11-02: 3000 [IU] via INTRAVENOUS
  Administered 2019-11-02: 7000 [IU] via INTRAVENOUS

## 2019-11-02 MED ORDER — FUROSEMIDE 40 MG PO TABS
60.0000 mg | ORAL_TABLET | Freq: Every day | ORAL | Status: DC
  Administered 2019-11-03: 60 mg via ORAL
  Filled 2019-11-02: qty 1

## 2019-11-02 MED ORDER — HEPARIN (PORCINE) IN NACL 1000-0.9 UT/500ML-% IV SOLN
INTRAVENOUS | Status: DC | PRN
  Administered 2019-11-02 (×2): 500 mL

## 2019-11-02 MED ORDER — IOHEXOL 350 MG/ML SOLN
INTRAVENOUS | Status: DC | PRN
  Administered 2019-11-02: 175 mL via INTRA_ARTERIAL

## 2019-11-02 MED ORDER — LIDOCAINE HCL (PF) 1 % IJ SOLN
INTRAMUSCULAR | Status: AC
  Filled 2019-11-02: qty 30

## 2019-11-02 MED ORDER — SODIUM CHLORIDE 0.9 % IV SOLN: INTRAVENOUS | Status: DC

## 2019-11-02 MED ORDER — DEXTROSE 5 % IV SOLN: INTRAVENOUS | Status: DC

## 2019-11-02 MED ORDER — SODIUM CHLORIDE 0.9 % WEIGHT BASED INFUSION: 1.0000 mL/kg/h | INTRAVENOUS | Status: AC

## 2019-11-02 MED ORDER — ATROPINE SULFATE 1 MG/10ML IJ SOSY
PREFILLED_SYRINGE | INTRAMUSCULAR | Status: AC
  Filled 2019-11-02: qty 10

## 2019-11-02 MED ORDER — MIDAZOLAM HCL 2 MG/2ML IJ SOLN
INTRAMUSCULAR | Status: AC
  Filled 2019-11-02: qty 2

## 2019-11-02 MED ORDER — HEPARIN SODIUM (PORCINE) 5000 UNIT/ML IJ SOLN
5000.0000 [IU] | Freq: Three times a day (TID) | INTRAMUSCULAR | Status: DC
  Administered 2019-11-03 – 2019-11-06 (×8): 5000 [IU] via SUBCUTANEOUS
  Filled 2019-11-02 (×11): qty 1

## 2019-11-02 MED ORDER — SODIUM CHLORIDE 0.9 % IV SOLN
INTRAVENOUS | Status: AC | PRN
  Administered 2019-11-02: 10 mL/h via INTRAVENOUS

## 2019-11-02 MED ORDER — LIDOCAINE HCL (PF) 1 % IJ SOLN
INTRAMUSCULAR | Status: DC | PRN
  Administered 2019-11-02: 10 mL

## 2019-11-02 MED ORDER — HEPARIN SODIUM (PORCINE) 1000 UNIT/ML IJ SOLN
INTRAMUSCULAR | Status: AC
  Filled 2019-11-02: qty 1

## 2019-11-02 MED ORDER — POTASSIUM CHLORIDE CRYS ER 20 MEQ PO TBCR
20.0000 meq | EXTENDED_RELEASE_TABLET | Freq: Once | ORAL | Status: AC
  Administered 2019-11-03: 20 meq via ORAL
  Filled 2019-11-02: qty 1

## 2019-11-02 MED ORDER — LABETALOL HCL 5 MG/ML IV SOLN: 10.0000 mg | INTRAVENOUS | Status: AC | PRN

## 2019-11-02 MED ORDER — MAGNESIUM SULFATE 2 GM/50ML IV SOLN
2.0000 g | Freq: Once | INTRAVENOUS | Status: AC
  Administered 2019-11-02: 2 g via INTRAVENOUS
  Filled 2019-11-02: qty 50

## 2019-11-02 MED ORDER — HYDRALAZINE HCL 20 MG/ML IJ SOLN: 10.0000 mg | INTRAMUSCULAR | Status: AC | PRN

## 2019-11-02 SURGICAL SUPPLY — 33 items
BALLN EUPHORA RX 2.5X20 (BALLOONS) ×2
BALLN SAPPHIRE 2.25X15 (BALLOONS) ×2
BALLN SAPPHIRE ~~LOC~~ 2.75X15 (BALLOONS) ×1 IMPLANT
BALLN SAPPHIRE ~~LOC~~ 3.25X18 (BALLOONS) ×2 IMPLANT
BALLOON EUPHORA RX 2.5X20 (BALLOONS) ×1 IMPLANT
BALLOON SAPPHIRE 2.25X15 (BALLOONS) IMPLANT
CABLE ADAPT PACING TEMP 12FT (ADAPTER) ×2 IMPLANT
CATH S G BIP PACING (CATHETERS) ×2 IMPLANT
CATH TELEPORT (CATHETERS) ×2 IMPLANT
CATH TELESCOPE 6F GEC (CATHETERS) ×2 IMPLANT
CATH VISTA GUIDE 7FR AL 1 (CATHETERS) ×2 IMPLANT
CATH VISTA GUIDE 7FRXB LAD 3.5 (CATHETERS) ×1 IMPLANT
CROWN DIAMONDBACK CLASSIC 1.25 (BURR) ×2 IMPLANT
ELECT DEFIB PAD ADLT CADENCE (PAD) ×2 IMPLANT
GUIDE CATH MACH 1 7F AL.75 (CATHETERS) ×2 IMPLANT
KIT ENCORE 26 ADVANTAGE (KITS) ×2 IMPLANT
KIT HEART LEFT (KITS) ×2 IMPLANT
LUBRICANT VIPERSLIDE CORONARY (MISCELLANEOUS) ×2 IMPLANT
PACK CARDIAC CATHETERIZATION (CUSTOM PROCEDURE TRAY) ×2 IMPLANT
SHEATH PINNACLE 6F 10CM (SHEATH) ×2 IMPLANT
SHEATH PINNACLE 7F 10CM (SHEATH) ×2 IMPLANT
SHEATH PROBE COVER 6X72 (BAG) ×2 IMPLANT
SLEEVE REPOSITIONING LENGTH 30 (MISCELLANEOUS) ×1 IMPLANT
STENT SYNERGY XD 2.50X20 (Permanent Stent) IMPLANT
STENT SYNERGY XD 3.0X48 (Permanent Stent) IMPLANT
SYNERGY XD 2.50X20 (Permanent Stent) ×2 IMPLANT
SYNERGY XD 3.0X48 (Permanent Stent) ×2 IMPLANT
TRANSDUCER W/STOPCOCK (MISCELLANEOUS) ×2 IMPLANT
TUBING CIL FLEX 10 FLL-RA (TUBING) ×2 IMPLANT
WIRE ASAHI PROWATER 180CM (WIRE) ×2 IMPLANT
WIRE ASAHI PROWATER 300CM (WIRE) ×2 IMPLANT
WIRE MAILMAN 300CM (WIRE) ×2 IMPLANT
WIRE VIPERWIRE COR FLEX .012 (WIRE) ×2 IMPLANT

## 2019-11-02 NOTE — Interval H&P Note (Signed)
History and Physical Interval Note:  11/02/2019 10:12 AM  [name]  has presented today for surgery, with the diagnosis of CAD.  The various methods of treatment have been discussed with the patient and family. After consideration of risks, benefits and other options for treatment, the patient has consented to  Procedure(s): CORONARY ATHERECTOMY (N/A) as a surgical intervention.  The patient's history has been reviewed, patient examined, no change in status, stable for surgery.  I have reviewed the patient's chart and labs.  Questions were answered to the patient's satisfaction.   Cath Lab Visit (complete for each Cath Lab visit)  Clinical Evaluation Leading to the Procedure:   ACS: No.  Non-ACS:    Anginal Classification: CCS III  Anti-ischemic medical therapy: Maximal Therapy (2 or more classes of medications)  Non-Invasive Test Results: No non-invasive testing performed  Prior CABG: No previous CABG        Collier Salina [facility] 11/02/2019 10:13 AM

## 2019-11-02 NOTE — Progress Notes (Signed)
PROGRESS NOTE                                                                                                                                                                                                             Patient Demographics:    Jamie [name], is a 59 y.o. 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    She was recently hospitalized twice to [facility] for shortness of breath, her work-up showed bilateral transudative pleural effusion, she had thoracentesis suggesting transudative fluid with no malignant cells.  Reason for recurrent effusion was thought to be acute on chronic diastolic CHF.  She was also found to have liver mass.  She was finally transferred to [facility] for cardiology evaluation for recurrent CHF.  Patient had right/left cardiac cath 3/10, significant for three-vessel disease, she is not a candidate for CABG, so plan for repeat cardiac cath with PCI planned for 3/12.   Subjective:   Patient in bed, appears comfortable, she denies any headache, no chest pain, no fever or chills.   Assessment  & Plan :   Severe Acute hypoxic respiratory failure due to recurrent Acute on chronic diastolic CHF EF 76% .   -Recurrent bilateral pleural effusions which are transudate if worked up in [place], EF 60%.   - She  had massive pulmonary edema with pleural effusion at the time of presentation and almost got  intubated, was aggressively diuresed and monitored in ICU for a day, now breathing much improved.  She also underwent ultrasound-guided thoracentesis on 3/3 and 3/4 respectively with 750 cc of transudative fluid removed from right side and 1300 cc of fluid removed from the left side.  Again this appears transudative with mesothelial cells and cytology.  Patient has had 2 other bouts of thoracentesis at [facility] earlier  this month. -Repeat chest x-ray clear/10, showing improved pleural effusion -Cardiology input greatly appreciated, please see discussion below  Acute Covid 19 Viral Pneumonitis during the ongoing 2020 Covid 19 Pandemic  - her main disease process and hypoxia is due to #1 above which is acute on chronic diastolic CHF.  Her Covid disease itself seems to be mild to moderate for which he has completed her treatment with remdesivir and steroids.  Encouraged the patient to sit up in chair in the daytime use I-S and flutter valve for pulmonary toiletry and then prone in bed when at night.  SpO2: 100 % O2 Flow Rate (L/min): 2 L/min FiO2 (%): 40 %  Recent Labs  Lab 10/27/19 0418 10/27/19 0418 10/28/19 0330 10/28/19 0330 10/29/19 0309 10/30/19 0426 10/30/19 0427 10/31/19 0410 11/01/19 0416 11/02/19 0415  CRP 0.6   < > 0.8   < > 0.6 0.6  --  0.6 0.6 1.0*  DDIMER 3.02*  --  3.47*  --  3.82* 3.82*  --   --   --   --   BNP 822.5*   < > 770.4*   < > 334.7*  --  361.3* 346.7* 179.2* 173.0*  PROCALCITON 0.31  --  0.22  --  0.21 0.27  --   --   --   --    < > = values in this interval not displayed.    Hepatic Function Latest Ref Rng & Units 11/02/2019 11/01/2019 10/31/2019  Total Protein 6.5 - 8.1 g/dL 5.5(L) 5.3(L) 5.6(L)  Albumin 3.5 - 5.0 g/dL 2.4(L) 2.4(L) 2.4(L)  AST 15 - 41 U/L 18 20 18   ALT 0 - 44 U/L 22 22 18   Alk Phosphatase 38 - 126 U/L 69 70 67  Total Bilirubin 0.3 - 1.2 mg/dL 0.3 0.5 0.4    CAD -Cardiac cath showing severe calcified three-vessel disease, per  cardiologist is not a candidate for CABG, so plan for atherectomy/PCI to RCA + PCI to LAD on Friday.  For now, will not intervene on LCx disease.  Plan for cardiac cath with PCI x2 today. - Start Crestor 20 mg daily.  - Continue ASA, started on Plavix(he was loaded with Plavix)  PAD.   -S/p right BKA, CT evidence of abdominal diffuse calcification with recurrent abdominal pain.  Highly questionable for ischemic bowel, for now continue aspirin and statin for secondary prevention, clear liquid diet and monitor.  She likely has diffuse atherosclerosis due to uncontrolled diabetes mellitus and hypertension.   Hypertensive crisis.  -Stopped  IV Clevidipine drip, oral medications adjusted and as needed IV hydralazine added.  Currently on combination of beta-blocker, Norvasc, clonidine, hydralazine along with Imdur.   GERD with nausea.  Also has HX of diabetic gastroparesis.   - For now supportive care with clear liquid diet, PPI and nausea medications.  Dyslipidemia.  - Continue statin.   Anemia of chronic disease.  - Monitor.  Hypertensive urgency.  - Resolved.  Depression and anxiety.   -Continue combination of bupropion, fluoxetine, buspirone and Xanax.  AKI  -Improving, continue to monitor closely especially with diuresis, and a repeat cardiac cath today  Liver mass suspicious on CT scan.   - No mass noted on MRI.  Hypomagnesemia and hypokalemia.   -Potassium low at 1.6 today, repleted  DM2 - placed on Lantus sliding scale along with Premeal NovoLog.  Monitor and adjust  Lab Results  Component Value Date   HGBA1C 6.9 (H) 10/23/2019     CBG (last 3)  Recent Labs    11/01/19 1648 11/01/19 2105 11/02/19 0748  GLUCAP 184* 235* 135*     Condition - Extremely Guarded  Family Communication  :  Daughter in law RN  @ [phone], called on 11/01/2019, unable to leave voicemail as I have full voicemail box  Code Status :  Full  Diet :   Diet Order            Diet NPO  time specified Except for: Sips with Meds  Diet effective midnight               Disposition Plan  : Unstable for discharge, she remains with recurrent pleural effusions, plan for cardiac cath today  Consults  : Pulmonary, cardiology  Procedures  :    MRI abdomen.  No suspicious liver lesion.    Ultrasound-guided right-sided thoracentesis on 10/24/2019 with 750 cc of fluid removed.  Done by pulmonary.  Ultrasound guided left-sided thoracentesis on 10/25/2019 with 1300 cc of fluid removed.  Done by pulmonary  Renal ultrasound.  CT - 1. Persistent moderate to large bilateral pleural effusions with overlying atelectasis. 2. No acute abdominal/pelvic findings, mass lesions or adenopathy. 3. Severe/advanced atherosclerotic calcifications involving the aorta and branch vessels for age. 4. Small to moderate amount of persistent free pelvic fluid. No obvious cause. 5. Calcified uterine fibroids. Aortic Atherosclerosis.  TTE from [facility] - EF 60%, dCHF  PICC line and Foley  requested.  PUD Prophylaxis : PPI  DVT Prophylaxis  :  Lovenox    Lab Results  Component Value Date   PLT 209 11/02/2019    Inpatient Medications  Scheduled Meds: . [MAR Hold] amLODipine  10 mg Oral Daily  . [MAR Hold] aspirin EC  81 mg Oral Daily  . [MAR Hold] atorvastatin  80 mg Oral QHS  . [MAR Hold] busPIRone  5 mg Oral Daily  . [MAR Hold] Chlorhexidine Gluconate Cloth  6 each Topical Daily  . [MAR Hold] clonazepam  0.25 mg Oral BID  . [MAR Hold] cloNIDine  0.3 mg Oral BID  . [MAR Hold] clopidogrel  75 mg Oral Daily  . [MAR Hold] FLUoxetine  40 mg Oral Daily  . [MAR Hold] folic acid  1 mg Oral Daily  . [MAR Hold] heparin  5,000 Units Subcutaneous Q8H  . [MAR Hold] hydrALAZINE  100 mg Oral Q8H  . [MAR Hold] insulin aspart  0-15 Units Subcutaneous TID WC  . [MAR Hold] insulin aspart  0-5 Units Subcutaneous QHS  . [MAR Hold] insulin glargine  30 Units Subcutaneous QHS  . [MAR Hold] isosorbide mononitrate  60  mg Oral Daily  . [MAR Hold] labetalol  300 mg Oral BID  . [MAR Hold] multivitamin with minerals  1 tablet Oral Daily  . [MAR Hold] pantoprazole  40 mg Oral Daily  . [MAR Hold] potassium chloride  20 mEq Oral Once  . [MAR Hold] rosuvastatin  20 mg Oral q1800  . [MAR Hold] sodium chloride flush  3 mL Intravenous Q12H  . [MAR Hold] sodium chloride flush  3 mL Intravenous Q12H  . [MAR Hold] spironolactone  25 mg Oral Daily  . [MAR Hold] thiamine  100 mg Oral Daily   Continuous Infusions: . [MAR Hold] sodium chloride    . sodium chloride    . sodium chloride 10 mL/hr (11/02/19 1120)   PRN Meds:.[MAR Hold] sodium chloride, sodium chloride, sodium chloride, [MAR Hold] acetaminophen, [MAR Hold] albuterol, [MAR Hold] ALPRAZolam, fentaNYL, Heparin (Porcine) in NaCl, heparin, [MAR Hold]  hydrALAZINE, [MAR Hold] HYDROcodone-acetaminophen, [MAR Hold] ipratropium-albuterol, lidocaine (PF), midazolam, [MAR Hold]  morphine injection, [MAR Hold] nitroGLYCERIN, nitroGLYCERIN, [MAR Hold] ondansetron (ZOFRAN) IV, ViperSlide lubricant in 0.9 % sodium chloride 1000 ml injection, [MAR Hold] sodium chloride flush, sodium chloride flush  Antibiotics  :    Anti-infectives (From admission, onward)   Start     Dose/Rate Route Frequency Ordered Stop   10/23/19 1000  remdesivir 100 mg in sodium chloride 0.9 % 100 mL IVPB     100 mg 200 mL/hr over 30 Minutes Intravenous Daily 10/22/19 1713 10/26/19 1006   10/22/19 1800  remdesivir 200 mg in sodium chloride 0.9% 250 mL IVPB     200 mg 580 mL/hr over 30 Minutes Intravenous Once 10/22/19 1713 10/22/19 1843   10/21/19 2100  cefTRIAXone (ROCEPHIN) 1 g in sodium chloride 0.9 % 100 mL IVPB  Status:  Discontinued     1 g 200 mL/hr over 30 Minutes Intravenous Every 24 hours 10/21/19 1744 10/22/19 0748   10/21/19 2100  azithromycin (ZITHROMAX) tablet 500 mg  Status:  Discontinued     500 mg Oral Daily 10/21/19 1744 10/22/19 0748       Phillips Climes M.D on 11/02/2019 at  12:08 PM  To page go to [email or web address] - password [facility]  [facility] -  Office  [phone]     See all Orders from today for further details    Objective:   Vitals:   11/02/19 0227 11/02/19 0349 11/02/19 0800 11/02/19 1051  BP:  (!) 140/58 (!) 156/62   Pulse:  71 74   Resp:  17 12   Temp:  98.2 F (36.8 C) 98.6 F (37 C)   TempSrc:  Oral Oral   SpO2:  100% 100% 100%  Weight: 66.8 kg     Height:        Wt Readings from Last 3 Encounters:  11/02/19 66.8 kg     Intake/Output Summary (Last 24 hours) at 11/02/2019 1208 Last data filed at 11/02/2019 0755 Gross per 24 hour  Intake 150 ml  Output 1300 ml  Net -1150 ml     Physical Exam  Awake Alert, Oriented X 3, No new F.N deficits, Normal affect Symmetrical Chest wall movement, Good air movement bilaterally, CTAB RRR,No Gallops,Rubs or new Murmurs, No Parasternal Heave +ve B.Sounds, Abd Soft, No tenderness, No rebound - guarding or rigidity. No Cyanosis, Clubbing or edema, has right BKA       Data Review:    CBC Recent Labs  Lab 10/29/19 0309 10/29/19 0309 10/30/19 0426 10/30/19 0426 10/31/19 0410 10/31/19 1446 10/31/19 1448 11/01/19 0416 11/02/19 0415  WBC 6.1  --  6.6  --  6.9  --   --  8.4 7.2  HGB 8.4*   < > 8.4*   < > 8.7* 8.8* 8.8* 8.4* 8.1*  HCT 27.4*   < > 26.7*   < > 27.7* 26.0* 26.0* 27.2* 25.9*  PLT 203  --  222  --  240  --   --  198 209  MCV 80.4  --  80.2  --  80.1  --   --  81.2 79.7*  MCH 24.6*  --  25.2*  --  25.1*  --   --  25.1* 24.9*  MCHC 30.7  --  31.5  --  31.4  --   --  30.9 31.3  RDW 13.5  --  13.7  --  13.8  --   --  13.8 13.7  LYMPHSABS  0.6*  --  0.7  --  0.9  --   --  0.5* 0.7  MONOABS 0.5  --  0.5  --  0.6  --   --  0.6 0.7  EOSABS 0.1  --  0.2  --  0.1  --   --  0.2 0.2  BASOSABS 0.0  --  0.0  --  0.0  --   --  0.0 0.0   < > = values in this interval not displayed.    Chemistries  Recent Labs  Lab 10/29/19 0309 10/29/19 0309 10/30/19 0426  10/30/19 0426 10/31/19 0410 10/31/19 1446 10/31/19 1448 11/01/19 0416 11/02/19 0415  NA 134*   < > 134*   < > 137 135 136 131* 135  K 3.6   < > 3.4*   < > 4.2 4.0 3.9 4.3 3.7  CL 99  --  99  --  100  --   --  99 101  CO2 26  --  26  --  26  --   --  24 24  GLUCOSE 262*  --  276*  --  175*  --   --  250* 148*  BUN 35*  --  22*  --  17  --   --  16 13  CREATININE 1.82*  --  1.31*  --  1.11*  --   --  1.03* 1.09*  CALCIUM 8.5*  --  8.6*  --  9.0  --   --  8.3* 8.5*  MG 1.9  --  1.6*  --  2.0  --   --  1.7 1.6*  AST 19  --  19  --  18  --   --  20 18  ALT 18  --  19  --  18  --   --  22 22  ALKPHOS 67  --  71  --  67  --   --  70 69  BILITOT 0.3  --  0.3  --  0.4  --   --  0.5 0.3   < > = values in this interval not displayed.   ------------------------------------------------------------------------------------------------------------------ Recent Labs    11/01/19 0416  CHOL 113  HDL 26*  LDLCALC 51  TRIG 179*  CHOLHDL 4.3    Lab Results  Component Value Date   HGBA1C 6.9 (H) 10/23/2019   ------------------------------------------------------------------------------------------------------------------ No results for input(s): TSH, T4TOTAL, T3FREE, THYROIDAB in the last 72 hours.  Invalid input(s): FREET3  Cardiac Enzymes No results for input(s): CKMB, TROPONINI, MYOGLOBIN in the last 168 hours.  Invalid input(s): CK ------------------------------------------------------------------------------------------------------------------    Component Value Date/Time   BNP 173.0 (H) 11/02/2019 0415    Micro Results Recent Results (from the past 240 hour(s))  Stat Gram stain     Status: None   Collection Time: 10/24/19 11:14 AM   Specimen: Pleura; Body Fluid  Result Value Ref Range Status   Specimen Description PLEURAL RIGHT  Final   Special Requests NONE  Final   Gram Stain   Final    RARE WBC PRESENT, PREDOMINANTLY MONONUCLEAR NO ORGANISMS SEEN Performed at [facility], [address] [place], [place] [address]    Report Status 10/24/2019 FINAL  Final  Respiratory Panel by PCR     Status: None   Collection Time: 10/25/19  4:18 AM   Specimen: Nasopharyngeal Swab; Respiratory  Result Value Ref Range Status   Adenovirus NOT DETECTED NOT DETECTED Final   Coronavirus 229E  NOT DETECTED NOT DETECTED Final    Comment: (NOTE) The Coronavirus on the Respiratory Panel, DOES NOT test for the novel  Coronavirus (2019 nCoV)    Coronavirus HKU1 NOT DETECTED NOT DETECTED Final   Coronavirus NL63 NOT DETECTED NOT DETECTED Final   Coronavirus OC43 NOT DETECTED NOT DETECTED Final   Metapneumovirus NOT DETECTED NOT DETECTED Final   Rhinovirus / Enterovirus NOT DETECTED NOT DETECTED Final   Influenza A NOT DETECTED NOT DETECTED Final   Influenza B NOT DETECTED NOT DETECTED Final   Parainfluenza Virus 1 NOT DETECTED NOT DETECTED Final   Parainfluenza Virus 2 NOT DETECTED NOT DETECTED Final   Parainfluenza Virus 3 NOT DETECTED NOT DETECTED Final   Parainfluenza Virus 4 NOT DETECTED NOT DETECTED Final   Respiratory Syncytial Virus NOT DETECTED NOT DETECTED Final   Bordetella pertussis NOT DETECTED NOT DETECTED Final   Chlamydophila pneumoniae NOT DETECTED NOT DETECTED Final   Mycoplasma pneumoniae NOT DETECTED NOT DETECTED Final    Comment: Performed at [facility], [place] [address], [place], [place] [address]  Body fluid culture (includes gram stain)     Status: None   Collection Time: 10/25/19  4:29 PM   Specimen: Pleural Fluid  Result Value Ref Range Status   Specimen Description FLUID PLEURAL  Final   Special Requests NONE  Final   Gram Stain   Final    FEW WBC PRESENT, PREDOMINANTLY MONONUCLEAR NO ORGANISMS SEEN    Culture   Final    NO GROWTH 3 DAYS Performed at [facility], [place] [address], [place], [place] [address]    Report Status 10/28/2019 FINAL  Final    Radiology Reports MR ABDOMEN WO CONTRAST  Result Date:  10/28/2019 CLINICAL DATA:  59 year old female with history of liver lesion. Follow-up study. EXAM: MRI ABDOMEN WITHOUT CONTRAST TECHNIQUE: Multiplanar multisequence MR imaging was performed without the administration of intravenous contrast. COMPARISON:  No prior abdominal MRI. CT the abdomen and pelvis 10/22/2019. FINDINGS: Comment: Today's study is limited by lack of IV gadolinium for detection and characterization of visceral and/or vascular lesions. Lower chest: Large right and moderate left pleural effusions. Signal in the visualize lung bases dependently, nonspecific, but favored to reflect areas of passive atelectasis. Hepatobiliary: No definite suspicious cystic or solid hepatic lesions are confidently identified on today's noncontrast examination. No intra or extrahepatic biliary ductal dilatation. Gallbladder is normal in appearance. Pancreas: No definite pancreatic mass or peripancreatic fluid collections or inflammatory changes noted on today's noncontrast CT examination. Spleen:  Unremarkable. Adrenals/Urinary Tract: Unenhanced appearance of the kidneys and bilateral adrenal glands are normal. Stomach/Bowel: Visualized portions are unremarkable. Vascular/Lymphatic: No aneurysm identified in the visualized abdominal vasculature. No lymphadenopathy noted in the abdomen. Other: No significant volume of ascites noted in the visualized portions of the peritoneal cavity. Musculoskeletal: No aggressive appearing osseous lesions are noted in the visualized portions of the skeleton. Mild diffuse body wall edema. IMPRESSION: 1. The lesion of concern on the prior CT scan is not demonstrated on today's noncontrast MRI examination. This may suggest a benign perfusion anomaly on the prior CT scan. Regardless, this lesion is unlikely to be related to the patient's history of abdominal pain. If there is persistent clinical concern, this lesion could be further evaluated with repeat abdominal MRI with and without IV  gadolinium. 2. Large right and moderate left pleural effusions with areas of probable passive atelectasis in the dependent portions of the lung bases bilaterally. 3. Mild diffuse body wall edema.  Electronically Signed   By: Vinnie Langton M.D.   On: 10/28/2019 08:17   CT ABDOMEN PELVIS W CONTRAST  Result Date: 10/22/2019 CLINICAL DATA:  Diffuse abdominal pain and anemia. EXAM: CT ABDOMEN AND PELVIS WITH CONTRAST TECHNIQUE: Multidetector CT imaging of the abdomen and pelvis was performed using the standard protocol following bolus administration of intravenous contrast. CONTRAST:  45m OMNIPAQUE IOHEXOL 300 MG/ML  SOLN COMPARISON:  CT scan 10/19/2019 FINDINGS: Lower chest: Persistent moderate to large bilateral pleural effusions with significant overlying atelectasis. The heart is normal in size. No pericardial effusion. Age advanced coronary artery calcifications are noted. Hepatobiliary: No focal hepatic lesions or intrahepatic biliary dilatation. Ill-defined area of contrast enhancement and segment 7 likely a vascular shunt. The gallbladder is mildly contracted. No common bile duct dilatation. Pancreas: Scattered calcifications or likely due to prior inflammation. No worrisome pancreatic lesions or acute inflammatory process. No ductal dilatation. Spleen: Normal size. No focal lesions. Adrenals/Urinary Tract: The adrenal glands and kidneys are unremarkable. No worrisome renal lesions or hydronephrosis. The bladder appears normal. Stomach/Bowel: The stomach, duodenum, small bowel and colon are grossly normal without oral contrast. No acute inflammatory changes, mass lesions or obstructive findings. Vascular/Lymphatic: Severe/advanced atherosclerotic calcifications involving the aorta and branch vessels for age. No aneurysm or dissection. The major venous structures are patent. No mesenteric or retroperitoneal mass or adenopathy. Reproductive: The uterus and ovaries are unremarkable. Calcified fibroid again  noted posteriorly. Other: Small to moderate amount of free pelvic fluid is again demonstrated. No obvious cause. Musculoskeletal: Stable compression fracture of L1. No worrisome bone lesions. Age advanced osteoporosis. IMPRESSION: 1. Persistent moderate to large bilateral pleural effusions with overlying atelectasis. 2. No acute abdominal/pelvic findings, mass lesions or adenopathy. 3. Severe/advanced atherosclerotic calcifications involving the aorta and branch vessels for age. 4. Small to moderate amount of persistent free pelvic fluid. No obvious cause. 5. Calcified uterine fibroids. Aortic Atherosclerosis (ICD10-I70.0). Electronically Signed   By: PMarijo SanesM.D.   On: 10/22/2019 13:56   CARDIAC CATHETERIZATION  Result Date: 10/31/2019 1. Optimized filling pressures after diuresis. 2. Successful Cardiomems placement. 3. Severe 3 vessel disease. Anatomy is best-suited to CABG, but patient is s/p BKA and does not have a prosthesis yet (not mobile).  She would have a hard time successfully rehabing after a cardiac surgery. Discussed with interventional team, will tentatively plan intervention on RCA and LAD.  The RCA will require atherectomy.  Will aim for Friday, will load Plavix prior.   UK[place]RENAL  Result Date: 10/25/2019 CLINICAL DATA:  Acute renal failure EXAM: RENAL / URINARY TRACT ULTRASOUND COMPLETE COMPARISON:  None. FINDINGS: Right Kidney: Renal measurements: 13.5 x 5.7 x 4.7 cm. = volume: 188 mL . Echogenicity within normal limits. No mass or hydronephrosis visualized. Left Kidney: Renal measurements: 14.2 x 4.9 x 4.3 cm = volume: 157 mL. Echogenicity within normal limits. No mass or hydronephrosis visualized. Bladder: Decompressed by Foley catheter. Other: None. IMPRESSION: No acute abnormality noted. Electronically Signed   By: MInez CatalinaM.D.   On: 10/25/2019 22:11   DG Chest Port 1 View  Result Date: 10/31/2019 CLINICAL DATA:  Shortness of breath. EXAM: PORTABLE CHEST 1 VIEW COMPARISON:   10/28/2019 FINDINGS: The right PICC line is stable. The cardiac silhouette, mediastinal and hilar contours are within normal limits and unchanged. Overall improved lung aeration with resolving edema, atelectasis and effusions. IMPRESSION: Improving lung aeration with resolving edema, atelectasis and effusions. Electronically Signed   By: PMarijo SanesM.D.   On: 10/31/2019  09:19   DG Chest Port 1 View  Result Date: 10/28/2019 CLINICAL DATA:  Shortness of breath, COVID positive EXAM: PORTABLE CHEST 1 VIEW COMPARISON:  10/26/2019 FINDINGS: Increased density at the lung bases. No pneumothorax. Stable cardiomediastinal contours. Right PICC line is again noted. IMPRESSION: Increased density at the lung bases probably reflecting a combination of pleural effusion and atelectasis. Electronically Signed   By: Macy Mis M.D.   On: 10/28/2019 07:50   DG Chest Port 1 View  Result Date: 10/26/2019 CLINICAL DATA:  Shortness of breath, COVID positive EXAM: PORTABLE CHEST 1 VIEW COMPARISON:  10/25/2019 FINDINGS: Right-sided PICC line terminates in the distal superior vena cava. Heart size is stable and normal accounting for projection. Increasing opacity at the left lung base without silhouetting of left hemidiaphragm. Subtle opacities are seen on a background of mild interstitial prominence worse in the left lung base, also present in right mid chest and lower chest. Visualized skeletal structures are unremarkable. IMPRESSION: 1. Increasing opacity at the left lung base without silhouetting of the left hemidiaphragm. Findings may represent developing effusion. 2. Subtle opacities in the right mid chest and lower chest. Findings could be seen in the setting of COVID-19 pneumonia. Continued follow-up suggested. Electronically Signed   By: Zetta Bills M.D.   On: 10/26/2019 08:59   DG Chest Port 1 View  Result Date: 10/25/2019 CLINICAL DATA:  Status post thoracentesis. EXAM: PORTABLE CHEST 1 VIEW COMPARISON:   Radiograph earlier this day. Lung bases from abdominal CT 10/22/2019 FINDINGS: Decreased left pleural effusion. No visualized pneumothorax. Hazy opacity at the right lung base likely small right pleural effusion and atelectasis. Right upper extremity PICC remains in place. Borderline cardiomegaly with unchanged mediastinal contours. Improved pulmonary edema from earlier this day. Bones are under mineralized. IMPRESSION: 1. Decreased left pleural effusion after thoracentesis. No visualized pneumothorax. 2. Hazy opacity at the right lung base likely small right pleural effusion and atelectasis. 3. Improving pulmonary edema. Electronically Signed   By: Keith Rake M.D.   On: 10/25/2019 17:17   DG Chest Port 1 View  Result Date: 10/25/2019 CLINICAL DATA:  Status post thoracentesis. COVID positive. EXAM: PORTABLE CHEST 1 VIEW COMPARISON:  One-view chest x-ray 10/23/19 FINDINGS: Heart size is normal. Right-sided PICC line terminates in the distal SVC. Left pleural effusion and airspace disease is improved. No pneumothorax is present. Mild edema is present. IMPRESSION: 1. Improving left pleural effusion and airspace disease. 2. No pneumothorax. 3. Mild edema. Electronically Signed   By: San Morelle M.D.   On: 10/25/2019 06:57   DG CHEST PORT 1 VIEW  Result Date: 10/23/2019 CLINICAL DATA:  Acute respiratory insufficiency EXAM: PORTABLE CHEST 1 VIEW COMPARISON:  October 22, 2019 FINDINGS: The heart size and mediastinal contours are unchanged with mild cardiomegaly. There is a small left pleural effusion. There is slight interval improvement in the hazy/interstitial markings throughout both lungs. No acute osseous abnormality. IMPRESSION: Slight interval improvement in the bilateral hazy airspace opacities which could be due to pulmonary edema or infectious etiology. Small left pleural effusion. Electronically Signed   By: Prudencio Pair M.D.   On: 10/23/2019 06:32   DG Chest Port 1 View  Result Date:  10/22/2019 CLINICAL DATA:  CHF respiratory distress.  COVID-19 positive EXAM: PORTABLE CHEST 1 VIEW COMPARISON:  10/20/2019 FINDINGS: Diffuse bilateral airspace disease with basilar predominance shows mild progression. Moderate left effusion has improved in the interval. Probable small right effusion. IMPRESSION: Mild progression of diffuse bilateral airspace disease which may represent  edema or pneumonia. Improvement in left pleural effusion. Electronically Signed   By: Franchot Gallo M.D.   On: 10/22/2019 09:28   ECHOCARDIOGRAM COMPLETE  Result Date: 10/23/2019    ECHOCARDIOGRAM REPORT   Patient Name:   Jamie [name]  Date of Exam: 10/23/2019 Medical Rec #:  [id number]  Height:       63.0 in Accession #:    [id number] Weight:       156.5 lb Date of Birth:  02-25-1961 BSA:          1.742 m Patient Age:    89 years   BP:           124/65 mmHg Patient Gender: F          HR:           66 bpm. Exam Location:  Inpatient Procedure: 2D Echo, Cardiac Doppler and Color Doppler Indications:    I50.33 Acute on chronic diastolic (congestive) heart failure  History:        Patient has no prior history of Echocardiogram examinations.                 COPD, TIA and PAD; Risk Factors:Hypertension and Diabetes.                 COVID-19 Positive.  Sonographer:    Jonelle Sidle Dance Referring Phys: [place]  1. Left ventricular ejection fraction, by estimation, is 55 to 60%. The left ventricle has normal function. The left ventricle has no regional wall motion abnormalities. Left ventricular diastolic parameters are consistent with Grade II diastolic dysfunction (pseudonormalization). Elevated left atrial pressure.  2. Right ventricular systolic function is normal. The right ventricular size is normal. Tricuspid regurgitation signal is inadequate for assessing PA pressure.  3. Left atrial size was moderately dilated.  4. Large pleural effusion in the left lateral region.  5. The mitral valve is normal in structure and  function. Mild mitral valve regurgitation.  6. The aortic valve is normal in structure and function. Aortic valve regurgitation is not visualized.  7. The inferior vena cava is dilated in size with >50% respiratory variability, suggesting right atrial pressure of 8 mmHg. Comparison(s): No prior Echocardiogram. FINDINGS  Left Ventricle: Left ventricular ejection fraction, by estimation, is 55 to 60%. The left ventricle has normal function. The left ventricle has no regional wall motion abnormalities. The left ventricular internal cavity size was normal in size. There is  no left ventricular hypertrophy. Left ventricular diastolic parameters are consistent with Grade II diastolic dysfunction (pseudonormalization). Elevated left atrial pressure. Right Ventricle: The right ventricular size is normal. No increase in right ventricular wall thickness. Right ventricular systolic function is normal. Tricuspid regurgitation signal is inadequate for assessing PA pressure. Left Atrium: Left atrial size was moderately dilated. Right Atrium: Right atrial size was normal in size. Pericardium: There is no evidence of pericardial effusion. Mitral Valve: The mitral valve is normal in structure and function. Mild mitral annular calcification. Mild mitral valve regurgitation. Tricuspid Valve: The tricuspid valve is normal in structure. Tricuspid valve regurgitation is not demonstrated. Aortic Valve: The aortic valve is normal in structure and function. Aortic valve regurgitation is not visualized. Pulmonic Valve: The pulmonic valve was grossly normal. Pulmonic valve regurgitation is not visualized. Aorta: The aortic root and ascending aorta are structurally normal, with no evidence of dilitation. Venous: The inferior vena cava is dilated in size with greater than 50% respiratory variability, suggesting right atrial pressure of 8 mmHg.  IAS/Shunts: No atrial level shunt detected by color flow Doppler. Additional Comments: There is a  large pleural effusion in the left lateral region.  LEFT VENTRICLE PLAX 2D LVIDd:         3.79 cm  Diastology LVIDs:         2.87 cm  LV e' lateral:   6.00 cm/s LV PW:         1.27 cm  LV E/e' lateral: 18.3 LV IVS:        0.94 cm  LV e' medial:    3.57 cm/s LVOT diam:     1.80 cm  LV E/e' medial:  30.8 LV SV:         52 LV SV Index:   30 LVOT Area:     2.54 cm  RIGHT VENTRICLE            IVC RV Basal diam:  3.24 cm    IVC diam: 2.47 cm RV Mid diam:    1.67 cm RV S prime:     9.32 cm/s TAPSE (M-mode): 1.8 cm LEFT ATRIUM             Index       RIGHT ATRIUM           Index LA diam:        3.90 cm 2.24 cm/m  RA Area:     15.50 cm LA Vol (A2C):   79.2 ml 45.46 ml/m RA Volume:   42.40 ml  24.33 ml/m LA Vol (A4C):   61.2 ml 35.12 ml/m LA Biplane Vol: 73.7 ml 42.30 ml/m  AORTIC VALVE LVOT Vmax:   89.80 cm/s LVOT Vmean:  62.000 cm/s LVOT VTI:    0.204 m  AORTA Ao Root diam: 2.80 cm Ao Asc diam:  2.40 cm MITRAL VALVE MV Area (PHT): 3.72 cm     SHUNTS MV Decel Time: 204 msec     Systemic VTI:  0.20 m MV E velocity: 110.00 cm/s  Systemic Diam: 1.80 cm MV A velocity: 54.30 cm/s MV E/A ratio:  2.03 Mihai Croitoru MD Electronically signed by Sanda Klein MD Signature Date/Time: 10/23/2019/3:50:43 PM    Final    [place] EKG SITE RITE  Result Date: 10/22/2019 If Site Rite image not attached, placement could not be confirmed due to current cardiac rhythm.  US Abdomen Limited RUQ  Result Date: 10/22/2019 CLINICAL DATA:  Acute right upper quadrant abdominal pain. EXAM: ULTRASOUND ABDOMEN LIMITED RIGHT UPPER QUADRANT COMPARISON:  October 19, 2019. FINDINGS: Gallbladder: No gallstones or wall thickening visualized. No sonographic Murphy sign noted by sonographer. Common bile duct: Diameter: 5 mm which is within normal limits. Liver: No focal lesion identified. Within normal limits in parenchymal echogenicity. Portal vein is patent on color Doppler imaging with normal direction of blood flow towards the liver. Other: Minimal  ascites is noted. IMPRESSION: Minimal ascites. No other abnormality seen in the right upper quadrant of the abdomen. Electronically Signed   By: Marijo Conception M.D.   On: 10/22/2019 09:03

## 2019-11-02 NOTE — Progress Notes (Signed)
Attempted renal artery duplex 10/31/2019- patient going to cath lab. Attempted renal artery duplex again, 11/02/2019- patient going to cath lab.  Will attempt again as schedule permits.  11/02/2019 10:17 AM Maudry Mayhew, MHA, RVT, RDCS, RDMS

## 2019-11-02 NOTE — Progress Notes (Signed)
Patient alert and oriented, no acute distress noted, no complaints. VS stable. Patient going to cath lab.

## 2019-11-02 NOTE — Significant Event (Signed)
Rapid Response Event Note  Overview: Called at 2057 to let RR RN that pt still has arterial and venous sheath from cath today. Per RN, site being assessed frequently and 2H RN to come draw ACT.   2315-Pt seen and site assessed. Site is oozy and dressing that was changed at 1900 is saturated. Popiteal pulse present. No hematoma palpated. Arterial sheath pressure bag still has heparin bag in it. Heparin bag changed to NS. Barbera Setters, 2H RN to beside. ACT drawn-169. 2H RN to pull sheaths.   Please call RRT if further assistance needed.   Dillard Essex

## 2019-11-02 NOTE — Progress Notes (Addendum)
Medications held post cath. Patients Blood pressure 201'E systolic and did not want to decrease too much prior to sheath pull. Patient NPO until after sheath pull.  Reviewed contact precautions with Dr. Baxter Flattery and instructed to use universal precautions with patient and no need for airborne precautions at this time. See note.  Right groin femoral site checked q 15 - 30 minutes. Oozing blood from site slowly during shift. Changed dressing at change of shift 1900 with oncoming RN during report.  2030 Set up Aline monitoring to femoral site to monitor for complications with site.

## 2019-11-02 NOTE — Progress Notes (Signed)
Patients blood sugar was 29, given amp of D50. Dr. Waldron Labs notified and orders received. Started D5W @ 30 mls/hr continuous, changed CBG's and s/s insulin coverage to q 4hours and dc'd lantus insulin. Rechecked CBG was 150. Patient resting quietly without complaints at 1740.

## 2019-11-02 NOTE — Progress Notes (Signed)
Assessed Right femoral site with day shift nurse. Sheath still in place, some blood oozing from site and groin tender but soft. Told that 2H charge nurse was to pull sheath when ready. Called and spoke with 2H charge nurse, stated that [place] staff would come up and assess site and possibly remove sheath. Also notified Rapid Response nurse about sheath. Will continue to monitor patient closely.

## 2019-11-02 NOTE — Progress Notes (Signed)
  [name] admitted for decompensated heart failure but also found to have covid +, thought to be mild-moderate disease, predominantly being treated for CHF. She was a transfer from [place] for which she was covid tested there as well which was negative on 2/22. Most recently during this admission, she has retested negative on 3/4. Based on mild-mod covid and being 10 days out from her infection, with repeat test negative,  She does not need airborne/contact isolation  Staff should keep her on universal precautions- wear mask and eye protection. Patient should wear mask when staff is in room. If she is to have any aerosolizing procedure, then staff should wear eye protection and N95.  Jamie [name] [facility] [phone]

## 2019-11-03 DIAGNOSIS — R0689 Other abnormalities of breathing: Secondary | ICD-10-CM

## 2019-11-03 DIAGNOSIS — I150 Renovascular hypertension: Secondary | ICD-10-CM

## 2019-11-03 LAB — PREPARE RBC (CROSSMATCH)

## 2019-11-03 LAB — URINALYSIS, ROUTINE W REFLEX MICROSCOPIC
Bilirubin Urine: NEGATIVE
Glucose, UA: 150 mg/dL — AB
Ketones, ur: NEGATIVE mg/dL
Nitrite: NEGATIVE
Protein, ur: 30 mg/dL — AB
Specific Gravity, Urine: 1.008 (ref 1.005–1.030)
pH: 7 (ref 5.0–8.0)

## 2019-11-03 LAB — CBC WITH DIFFERENTIAL/PLATELET
Abs Immature Granulocytes: 0.05 10*3/uL (ref 0.00–0.07)
Basophils Absolute: 0 10*3/uL (ref 0.0–0.1)
Basophils Relative: 0 %
Eosinophils Absolute: 0.1 10*3/uL (ref 0.0–0.5)
Eosinophils Relative: 2 %
HCT: 23 % — ABNORMAL LOW (ref 36.0–46.0)
Hemoglobin: 7.2 g/dL — ABNORMAL LOW (ref 12.0–15.0)
Immature Granulocytes: 1 %
Lymphocytes Relative: 6 %
Lymphs Abs: 0.5 10*3/uL — ABNORMAL LOW (ref 0.7–4.0)
MCH: 25.5 pg — ABNORMAL LOW (ref 26.0–34.0)
MCHC: 31.3 g/dL (ref 30.0–36.0)
MCV: 81.6 fL (ref 80.0–100.0)
Monocytes Absolute: 0.5 10*3/uL (ref 0.1–1.0)
Monocytes Relative: 7 %
Neutro Abs: 6.8 10*3/uL (ref 1.7–7.7)
Neutrophils Relative %: 84 %
Platelets: 184 10*3/uL (ref 150–400)
RBC: 2.82 MIL/uL — ABNORMAL LOW (ref 3.87–5.11)
RDW: 14.1 % (ref 11.5–15.5)
WBC: 8 10*3/uL (ref 4.0–10.5)
nRBC: 0 % (ref 0.0–0.2)

## 2019-11-03 LAB — COMPREHENSIVE METABOLIC PANEL
ALT: 24 U/L (ref 0–44)
AST: 52 U/L — ABNORMAL HIGH (ref 15–41)
Albumin: 2.1 g/dL — ABNORMAL LOW (ref 3.5–5.0)
Alkaline Phosphatase: 59 U/L (ref 38–126)
Anion gap: 8 (ref 5–15)
BUN: 9 mg/dL (ref 6–20)
CO2: 22 mmol/L (ref 22–32)
Calcium: 7.7 mg/dL — ABNORMAL LOW (ref 8.9–10.3)
Chloride: 99 mmol/L (ref 98–111)
Creatinine, Ser: 0.97 mg/dL (ref 0.44–1.00)
GFR calc Af Amer: >60 mL/min (ref >60)
GFR calc non Af Amer: >60 mL/min (ref >60)
Glucose, Bld: 371 mg/dL — ABNORMAL HIGH (ref 70–99)
Potassium: 4.2 mmol/L (ref 3.5–5.1)
Sodium: 129 mmol/L — ABNORMAL LOW (ref 135–145)
Total Bilirubin: <0.1 mg/dL — ABNORMAL LOW (ref 0.3–1.2)
Total Protein: 4.7 g/dL — ABNORMAL LOW (ref 6.5–8.1)

## 2019-11-03 LAB — GLUCOSE, CAPILLARY
Glucose-Capillary: 147 mg/dL — ABNORMAL HIGH (ref 70–99)
Glucose-Capillary: 181 mg/dL — ABNORMAL HIGH (ref 70–99)
Glucose-Capillary: 186 mg/dL — ABNORMAL HIGH (ref 70–99)
Glucose-Capillary: 221 mg/dL — ABNORMAL HIGH (ref 70–99)
Glucose-Capillary: 251 mg/dL — ABNORMAL HIGH (ref 70–99)
Glucose-Capillary: 257 mg/dL — ABNORMAL HIGH (ref 70–99)
Glucose-Capillary: 328 mg/dL — ABNORMAL HIGH (ref 70–99)

## 2019-11-03 LAB — MAGNESIUM: Magnesium: 1.9 mg/dL (ref 1.7–2.4)

## 2019-11-03 LAB — C-REACTIVE PROTEIN: CRP: 1.5 mg/dL — ABNORMAL HIGH (ref <1.0)

## 2019-11-03 LAB — BRAIN NATRIURETIC PEPTIDE: B Natriuretic Peptide: 472.3 pg/mL — ABNORMAL HIGH (ref 0.0–100.0)

## 2019-11-03 MED ORDER — FUROSEMIDE 10 MG/ML IJ SOLN
INTRAMUSCULAR | Status: AC
  Administered 2019-11-03: 80 mg
  Filled 2019-11-03: qty 8

## 2019-11-03 MED ORDER — INSULIN ASPART 100 UNIT/ML ~~LOC~~ SOLN
0.0000 [IU] | Freq: Every day | SUBCUTANEOUS | Status: DC
  Administered 2019-11-05: 3 [IU] via SUBCUTANEOUS

## 2019-11-03 MED ORDER — FUROSEMIDE 10 MG/ML IJ SOLN
40.0000 mg | Freq: Two times a day (BID) | INTRAMUSCULAR | Status: DC
  Administered 2019-11-04 (×2): 40 mg via INTRAVENOUS
  Filled 2019-11-03 (×3): qty 4

## 2019-11-03 MED ORDER — INSULIN ASPART 100 UNIT/ML ~~LOC~~ SOLN
0.0000 [IU] | Freq: Three times a day (TID) | SUBCUTANEOUS | Status: DC
  Administered 2019-11-03: 2 [IU] via SUBCUTANEOUS
  Administered 2019-11-03: 5 [IU] via SUBCUTANEOUS
  Administered 2019-11-04 (×2): 2 [IU] via SUBCUTANEOUS
  Administered 2019-11-04: 5 [IU] via SUBCUTANEOUS
  Administered 2019-11-05 (×2): 7 [IU] via SUBCUTANEOUS
  Administered 2019-11-05: 3 [IU] via SUBCUTANEOUS
  Administered 2019-11-06: 5 [IU] via SUBCUTANEOUS
  Administered 2019-11-06: 9 [IU] via SUBCUTANEOUS

## 2019-11-03 MED ORDER — FUROSEMIDE 10 MG/ML IJ SOLN
40.0000 mg | Freq: Once | INTRAMUSCULAR | Status: AC
  Administered 2019-11-03: 40 mg via INTRAVENOUS
  Filled 2019-11-03: qty 4

## 2019-11-03 MED ORDER — INSULIN GLARGINE 100 UNIT/ML ~~LOC~~ SOLN
8.0000 [IU] | Freq: Every day | SUBCUTANEOUS | Status: DC
  Administered 2019-11-03 – 2019-11-04 (×2): 8 [IU] via SUBCUTANEOUS
  Filled 2019-11-03 (×3): qty 0.08

## 2019-11-03 MED ORDER — SODIUM CHLORIDE 0.9% IV SOLUTION: Freq: Once | INTRAVENOUS | Status: DC

## 2019-11-03 NOTE — Progress Notes (Signed)
Pt c/o SOB, hurting when take deep breaths. Pt's sating at 100% on 2L of O2 via nasal cannual.  RT was called for a breathing treatment, put pt on 4L.  Pt also c/o of abd pain/ back pain. Received order for bladder scan and In and out cath at bedside from [facility], MD.   Bladder scan showed 114cc, in an out cath 100cc of urine removed, sent for UA and culture. [place], MD updated. No other complaint at this time, will continue to monitor.

## 2019-11-03 NOTE — Progress Notes (Signed)
PROGRESS NOTE                                                                                                                                                                                                             Patient Demographics:    Jamie [name], is a 59 y.o. 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    She was recently hospitalized twice to [facility] for shortness of breath, her work-up showed bilateral transudative pleural effusion, she had thoracentesis suggesting transudative fluid with no malignant cells.  Reason for recurrent effusion was thought to be acute on chronic diastolic CHF.  She was also found to have liver mass.  She was finally transferred to [facility] for cardiology evaluation for recurrent CHF.  Patient had right/left cardiac cath 3/10, significant for three-vessel disease, she is not a candidate for CABG, so plan for repeat cardiac cath with PCI planned for 3/12.   Subjective:   After Patient had a complaints of dyspnea, discomfort in her abdomen, which has resolved without intervention   Assessment  & Plan :   Severe Acute hypoxic respiratory failure due to recurrent Acute on chronic diastolic CHF EF 71% .   - Recurrent bilateral pleural effusions which are transudate if worked up in [place], EF 60%.   - She recurrent episodes of massive pulmonary edema, with recurrent pleural effusion, she  required transfusion to ICU multiple times, she did require multiple centesis as well(occluding [facility] hospitalization for the same). [place] failure has improved, she is currently tolerating 2 L nasal cannula -Cardiology input greatly appreciated, please see discussion below -Diuresis per cardiology, patient currently on 40 mg IV Lasix twice daily.  Acute Covid 19 Viral Pneumonitis during the ongoing 2020 Covid 19 Pandemic  - her main disease process and hypoxia is  due to #1 above which is acute on chronic diastolic CHF.  Her Covid disease itself seems to be mild to moderate for which he has completed her treatment with remdesivir and steroids.  Encouraged the patient to sit up in chair in the daytime use I-S and flutter valve for pulmonary toiletry and then prone in bed when at night.  SpO2: 99 % O2 Flow Rate (L/min): 2 L/min FiO2 (%): 40 %  Recent Labs  Lab 10/28/19 0330 10/28/19 0330 10/29/19 0309 10/29/19 0309 10/30/19 0426 10/30/19 0427 10/31/19 0410 11/01/19 0416 11/02/19 0415 11/03/19 0440  CRP 0.8   < > 0.6   < > 0.6  --  0.6 0.6 1.0* 1.5*  DDIMER 3.47*  --  3.82*  --  3.82*  --   --   --   --   --   BNP 770.4*   < > 334.7*   < >  --  361.3* 346.7* 179.2* 173.0* 472.3*  PROCALCITON 0.22  --  0.21  --  0.27  --   --   --   --   --    < > = values in this interval not displayed.    Hepatic Function Latest Ref Rng & Units 11/03/2019 11/02/2019 11/01/2019  Total Protein 6.5 - 8.1 g/dL 4.7(L) 5.5(L) 5.3(L)  Albumin 3.5 - 5.0 g/dL 2.1(L) 2.4(L) 2.4(L)  AST 15 - 41 U/L 52(H) 18 20  ALT 0 - 44 U/L 24 22 22   Alk Phosphatase 38 - 126 U/L 59 69 70  Total Bilirubin 0.3 - 1.2 mg/dL <0.1(L) 0.3 0.5    CAD -Already cath showing evidence of calcified three-vessel disease, this is likely contributing to her multiple episodes of flash pulmonary edema, ideally she would need CABG, but she is not a CABG candidate per cardiology . -  She underwent a successful PCI of the proximal to mid  RCA with orbital atherectomy and stenting with a long 3.0 x 48 mm Synergy stent yesterday. Successful PCI of the mid LAD with DES x 1.  - Start Crestor 20 mg daily.  - Continue ASA,  statin and Plavix . - DAPT for at least one year. Further management per Heart failure team.  - Hold off on ACEI/ARB with AKI.   PAD.   -S/p right BKA, CT evidence of abdominal diffuse calcification with recurrent abdominal pain.  Highly questionable for ischemic bowel, for now continue aspirin and statin for secondary prevention, clear liquid diet and monitor.  She likely has diffuse atherosclerosis due to uncontrolled diabetes mellitus and hypertension.   Hypertensive crisis.  -Stopped  IV Clevidipine drip, oral medications adjusted and as needed IV hydralazine added.  - Currently on combination of beta-blocker, Norvasc, clonidine, hydralazine along with Imdur.   GERD with nausea.  Also has HX of diabetic gastroparesis.   - For now supportive care with clear liquid diet, PPI and nausea medications.  Dyslipidemia.  - Continue statin.   Anemia of chronic disease.  -Hg is low at 7.2 today, will transfuse 1 unit PRBC given her significant CAD  Hypertensive urgency.  - Resolved.  Depression and anxiety.   -Continue combination of bupropion, fluoxetine, buspirone and Xanax.  AKI  -Improving, continue to monitor closely especially with diuresis, and cardiac cath x2 back to back.  Liver mass suspicious on CT scan.   - No mass noted on MRI.  Hypomagnesemia and hypokalemia.   - repleted  DM2 - placed on Lantus sliding scale along with Premeal NovoLog.  Monitor and adjust  Lab Results  Component Value Date   HGBA1C 6.9 (H) 10/23/2019     CBG (last 3)  Recent Labs    11/03/19 0805 11/03/19 1107 11/03/19 1249  GLUCAP 221* 328* 251*     Condition - Extremely Guarded  Family Communication  :  Daughter in law RN  @ [phone], called on 11/03/2019, unable to leave voicemail as I have full  voicemail box  Code Status :  Full  Diet :   Diet Order            Diet Carb Modified Fluid consistency: Thin; Room service appropriate? Yes  Diet effective now               Disposition Plan  : Unstable for discharge, she remains with recurrent pleural effusions, plan for cardiac cath today  Consults  : Pulmonary, cardiology  Procedures  :    MRI abdomen.  No suspicious liver lesion.    Ultrasound-guided right-sided thoracentesis on 10/24/2019 with 750 cc of fluid removed.  Done by pulmonary.  Ultrasound guided left-sided thoracentesis on 10/25/2019 with 1300 cc of fluid removed.  Done by pulmonary  Renal ultrasound.  CT - 1. Persistent moderate to large bilateral pleural effusions with overlying atelectasis. 2. No acute abdominal/pelvic findings, mass lesions or adenopathy. 3. Severe/advanced atherosclerotic calcifications involving the aorta and branch vessels for age. 4. Small to moderate amount of persistent free pelvic fluid. No obvious cause. 5. Calcified uterine fibroids. Aortic Atherosclerosis.  TTE from [facility] - EF 60%, dCHF  PICC line and Foley  Requested.  Cardiac cath with stent placement 3/12.  PUD Prophylaxis : PPI  DVT Prophylaxis  :  Lovenox    Lab Results  Component Value Date   PLT 184 11/03/2019    Inpatient Medications  Scheduled Meds: . sodium chloride   Intravenous Once  . amLODipine  10 mg Oral Daily  . aspirin EC  81 mg Oral Daily  . atorvastatin  80 mg Oral QHS  . busPIRone  5 mg Oral Daily  . Chlorhexidine Gluconate Cloth  6 each Topical Daily  . clonazepam  0.25 mg Oral BID  . cloNIDine  0.3 mg Oral BID  . clopidogrel  75 mg Oral Daily  . FLUoxetine  40 mg Oral Daily  . folic acid  1 mg Oral Daily  . furosemide  60 mg Oral Daily  . heparin  5,000 Units Subcutaneous Q8H  . hydrALAZINE  100 mg Oral Q8H  . insulin aspart  0-5 Units Subcutaneous QHS  . insulin aspart  0-9 Units Subcutaneous TID WC  . insulin glargine  8 Units  Subcutaneous Daily  . isosorbide mononitrate  60 mg Oral Daily  . labetalol  300 mg Oral BID  . multivitamin with minerals  1 tablet Oral Daily  . pantoprazole  40 mg Oral Daily  . rosuvastatin  20 mg Oral q1800  . sodium chloride flush  3 mL Intravenous Q12H  . sodium chloride flush  3 mL Intravenous Q12H  . spironolactone  25 mg Oral Daily  . thiamine  100 mg Oral Daily   Continuous Infusions: . sodium chloride     PRN Meds:.sodium chloride, acetaminophen, albuterol, ALPRAZolam, hydrALAZINE, HYDROcodone-acetaminophen, ipratropium-albuterol, morphine injection, nitroGLYCERIN, ondansetron (ZOFRAN) IV, sodium chloride flush  Antibiotics  :    Anti-infectives (From admission, onward)   Start     Dose/Rate Route Frequency Ordered Stop   10/23/19 1000  remdesivir 100 mg in sodium chloride 0.9 %  100 mL IVPB     100 mg 200 mL/hr over 30 Minutes Intravenous Daily 10/22/19 1713 10/26/19 1006   10/22/19 1800  remdesivir 200 mg in sodium chloride 0.9% 250 mL IVPB     200 mg 580 mL/hr over 30 Minutes Intravenous Once 10/22/19 1713 10/22/19 1843   10/21/19 2100  cefTRIAXone (ROCEPHIN) 1 g in sodium chloride 0.9 % 100 mL IVPB  Status:  Discontinued     1 g 200 mL/hr over 30 Minutes Intravenous Every 24 hours 10/21/19 1744 10/22/19 0748   10/21/19 2100  azithromycin (ZITHROMAX) tablet 500 mg  Status:  Discontinued     500 mg Oral Daily 10/21/19 1744 10/22/19 0748       Phillips Climes M.D on 11/03/2019 at 2:56 PM  To page go to [email or web address] - password [facility]  [facility] -  Office  [phone]     See all Orders from today for further details    Objective:   Vitals:   11/03/19 0352 11/03/19 0605 11/03/19 0830 11/03/19 1321  BP: (!) 140/58 (!) 149/59 (!) 132/47   Pulse: 74  85   Resp: 14  16   Temp: 99.8 F (37.7 C)     TempSrc: Axillary     SpO2: 100%  100% 99%  Weight: 68 kg     Height:        Wt Readings from Last 3 Encounters:  11/03/19 68 kg      Intake/Output Summary (Last 24 hours) at 11/03/2019 1456 Last data filed at 11/03/2019 1300 Gross per 24 hour  Intake 756.81 ml  Output 450 ml  Net 306.81 ml     Physical Exam  Awake Alert, Oriented X 3, No new F.N deficits, Normal affect Symmetrical Chest wall movement, Good air movement bilaterally, bibasilar Rales RRR,No Gallops,Rubs or new Murmurs, No Parasternal Heave +ve B.Sounds, Abd Soft, No tenderness, No rebound - guarding or rigidity. No Cyanosis, Clubbing or edema, right BKA       Data Review:    CBC Recent Labs  Lab 10/30/19 0426 10/30/19 0426 10/31/19 0410 10/31/19 0410 10/31/19 1446 10/31/19 1448 11/01/19 0416 11/02/19 0415 11/03/19 0440  WBC 6.6  --  6.9  --   --   --  8.4 7.2 8.0  HGB 8.4*   < > 8.7*   < > 8.8* 8.8* 8.4* 8.1* 7.2*  HCT 26.7*   < > 27.7*   < > 26.0* 26.0* 27.2* 25.9* 23.0*  PLT 222  --  240  --   --   --  198 209 184  MCV 80.2  --  80.1  --   --   --  81.2 79.7* 81.6  MCH 25.2*  --  25.1*  --   --   --  25.1* 24.9* 25.5*  MCHC 31.5  --  31.4  --   --   --  30.9 31.3 31.3  RDW 13.7  --  13.8  --   --   --  13.8 13.7 14.1  LYMPHSABS 0.7  --  0.9  --   --   --  0.5* 0.7 0.5*  MONOABS 0.5  --  0.6  --   --   --  0.6 0.7 0.5  EOSABS 0.2  --  0.1  --   --   --  0.2 0.2 0.1  BASOSABS 0.0  --  0.0  --   --   --  0.0 0.0 0.0   < > = values  in this interval not displayed.    Chemistries  Recent Labs  Lab 10/30/19 0426 10/30/19 0426 10/31/19 0410 10/31/19 0410 10/31/19 1446 10/31/19 1448 11/01/19 0416 11/02/19 0415 11/03/19 0440  NA 134*   < > 137   < > 135 136 131* 135 129*  K 3.4*   < > 4.2   < > 4.0 3.9 4.3 3.7 4.2  CL 99  --  100  --   --   --  99 101 99  CO2 26  --  26  --   --   --  24 24 22   GLUCOSE 276*  --  175*  --   --   --  250* 148* 371*  BUN 22*  --  17  --   --   --  16 13 9   CREATININE 1.31*  --  1.11*  --   --   --  1.03* 1.09* 0.97  CALCIUM 8.6*  --  9.0  --   --   --  8.3* 8.5* 7.7*  MG 1.6*  --  2.0   --   --   --  1.7 1.6* 1.9  AST 19  --  18  --   --   --  20 18 52*  ALT 19  --  18  --   --   --  22 22 24   ALKPHOS 71  --  67  --   --   --  70 69 59  BILITOT 0.3  --  0.4  --   --   --  0.5 0.3 <0.1*   < > = values in this interval not displayed.   ------------------------------------------------------------------------------------------------------------------ Recent Labs    11/01/19 0416  CHOL 113  HDL 26*  LDLCALC 51  TRIG 179*  CHOLHDL 4.3    Lab Results  Component Value Date   HGBA1C 6.9 (H) 10/23/2019   ------------------------------------------------------------------------------------------------------------------ No results for input(s): TSH, T4TOTAL, T3FREE, THYROIDAB in the last 72 hours.  Invalid input(s): FREET3  Cardiac Enzymes No results for input(s): CKMB, TROPONINI, MYOGLOBIN in the last 168 hours.  Invalid input(s): CK ------------------------------------------------------------------------------------------------------------------    Component Value Date/Time   BNP 472.3 (H) 11/03/2019 0440    Micro Results Recent Results (from the past 240 hour(s))  Respiratory Panel by PCR     Status: None   Collection Time: 10/25/19  4:18 AM   Specimen: Nasopharyngeal Swab; Respiratory  Result Value Ref Range Status   Adenovirus NOT DETECTED NOT DETECTED Final   Coronavirus 229E NOT DETECTED NOT DETECTED Final    Comment: (NOTE) The Coronavirus on the Respiratory Panel, DOES NOT test for the novel  Coronavirus (2019 nCoV)    Coronavirus HKU1 NOT DETECTED NOT DETECTED Final   Coronavirus NL63 NOT DETECTED NOT DETECTED Final   Coronavirus OC43 NOT DETECTED NOT DETECTED Final   Metapneumovirus NOT DETECTED NOT DETECTED Final   Rhinovirus / Enterovirus NOT DETECTED NOT DETECTED Final   Influenza A NOT DETECTED NOT DETECTED Final   Influenza B NOT DETECTED NOT DETECTED Final   Parainfluenza Virus 1 NOT DETECTED NOT DETECTED Final   Parainfluenza Virus 2  NOT DETECTED NOT DETECTED Final   Parainfluenza Virus 3 NOT DETECTED NOT DETECTED Final   Parainfluenza Virus 4 NOT DETECTED NOT DETECTED Final   Respiratory Syncytial Virus NOT DETECTED NOT DETECTED Final   Bordetella pertussis NOT DETECTED NOT DETECTED Final   Chlamydophila pneumoniae NOT DETECTED NOT DETECTED Final   Mycoplasma pneumoniae NOT DETECTED  NOT DETECTED Final    Comment: Performed at [facility], [place] [address], [place], [place] [address]  Body fluid culture (includes gram stain)     Status: None   Collection Time: 10/25/19  4:29 PM   Specimen: Pleural Fluid  Result Value Ref Range Status   Specimen Description FLUID PLEURAL  Final   Special Requests NONE  Final   Gram Stain   Final    FEW WBC PRESENT, PREDOMINANTLY MONONUCLEAR NO ORGANISMS SEEN    Culture   Final    NO GROWTH 3 DAYS Performed at [facility], [place] [address], [place], [place] [address]    Report Status 10/28/2019 FINAL  Final    Radiology Reports MR ABDOMEN WO CONTRAST  Result Date: 10/28/2019 CLINICAL DATA:  59 year old female with history of liver lesion. Follow-up study. EXAM: MRI ABDOMEN WITHOUT CONTRAST TECHNIQUE: Multiplanar multisequence MR imaging was performed without the administration of intravenous contrast. COMPARISON:  No prior abdominal MRI. CT the abdomen and pelvis 10/22/2019. FINDINGS: Comment: Today's study is limited by lack of IV gadolinium for detection and characterization of visceral and/or vascular lesions. Lower chest: Large right and moderate left pleural effusions. Signal in the visualize lung bases dependently, nonspecific, but favored to reflect areas of passive atelectasis. Hepatobiliary: No definite suspicious cystic or solid hepatic lesions are confidently identified on today's noncontrast examination. No intra or extrahepatic biliary ductal dilatation. Gallbladder is normal in appearance. Pancreas: No definite pancreatic mass or peripancreatic fluid collections  or inflammatory changes noted on today's noncontrast CT examination. Spleen:  Unremarkable. Adrenals/Urinary Tract: Unenhanced appearance of the kidneys and bilateral adrenal glands are normal. Stomach/Bowel: Visualized portions are unremarkable. Vascular/Lymphatic: No aneurysm identified in the visualized abdominal vasculature. No lymphadenopathy noted in the abdomen. Other: No significant volume of ascites noted in the visualized portions of the peritoneal cavity. Musculoskeletal: No aggressive appearing osseous lesions are noted in the visualized portions of the skeleton. Mild diffuse body wall edema. IMPRESSION: 1. The lesion of concern on the prior CT scan is not demonstrated on today's noncontrast MRI examination. This may suggest a benign perfusion anomaly on the prior CT scan. Regardless, this lesion is unlikely to be related to the patient's history of abdominal pain. If there is persistent clinical concern, this lesion could be further evaluated with repeat abdominal MRI with and without IV gadolinium. 2. Large right and moderate left pleural effusions with areas of probable passive atelectasis in the dependent portions of the lung bases bilaterally. 3. Mild diffuse body wall edema. Electronically Signed   By: Vinnie Langton M.D.   On: 10/28/2019 08:17   CT ABDOMEN PELVIS W CONTRAST  Result Date: 10/22/2019 CLINICAL DATA:  Diffuse abdominal pain and anemia. EXAM: CT ABDOMEN AND PELVIS WITH CONTRAST TECHNIQUE: Multidetector CT imaging of the abdomen and pelvis was performed using the standard protocol following bolus administration of intravenous contrast. CONTRAST:  22m OMNIPAQUE IOHEXOL 300 MG/ML  SOLN COMPARISON:  CT scan 10/19/2019 FINDINGS: Lower chest: Persistent moderate to large bilateral pleural effusions with significant overlying atelectasis. The heart is normal in size. No pericardial effusion. Age advanced coronary artery calcifications are noted. Hepatobiliary: No focal hepatic lesions  or intrahepatic biliary dilatation. Ill-defined area of contrast enhancement and segment 7 likely a vascular shunt. The gallbladder is mildly contracted. No common bile duct dilatation. Pancreas: Scattered calcifications or likely due to prior inflammation. No worrisome pancreatic lesions or acute inflammatory process. No ductal dilatation. Spleen: Normal size. No focal lesions. Adrenals/Urinary Tract:  The adrenal glands and kidneys are unremarkable. No worrisome renal lesions or hydronephrosis. The bladder appears normal. Stomach/Bowel: The stomach, duodenum, small bowel and colon are grossly normal without oral contrast. No acute inflammatory changes, mass lesions or obstructive findings. Vascular/Lymphatic: Severe/advanced atherosclerotic calcifications involving the aorta and branch vessels for age. No aneurysm or dissection. The major venous structures are patent. No mesenteric or retroperitoneal mass or adenopathy. Reproductive: The uterus and ovaries are unremarkable. Calcified fibroid again noted posteriorly. Other: Small to moderate amount of free pelvic fluid is again demonstrated. No obvious cause. Musculoskeletal: Stable compression fracture of L1. No worrisome bone lesions. Age advanced osteoporosis. IMPRESSION: 1. Persistent moderate to large bilateral pleural effusions with overlying atelectasis. 2. No acute abdominal/pelvic findings, mass lesions or adenopathy. 3. Severe/advanced atherosclerotic calcifications involving the aorta and branch vessels for age. 4. Small to moderate amount of persistent free pelvic fluid. No obvious cause. 5. Calcified uterine fibroids. Aortic Atherosclerosis (ICD10-I70.0). Electronically Signed   By: Marijo Sanes M.D.   On: 10/22/2019 13:56   CARDIAC CATHETERIZATION  Result Date: 11/02/2019  Prox RCA lesion is 90% stenosed.  Prox RCA to Mid RCA lesion is 70% stenosed.  Post intervention, there is a 0% residual stenosis.  A drug-eluting stent was successfully  placed using a SYNERGY XD 3.0X48.  Post intervention, there is a 0% residual stenosis.  Mid LAD lesion is 90% stenosed.  Post intervention, there is a 0% residual stenosis.  A drug-eluting stent was successfully placed using a SYNERGY XD 2.50X20.  1. Successful PCI of the proximal to mid RCA with orbital atherectomy and stenting with a long 3.0 x 48 mm Synergy stent 2. Successful PCI of the mid LAD with DES x 1. Plan: DAPT for at least one year. Further management per Heart failure team.   CARDIAC CATHETERIZATION  Result Date: 10/31/2019 1. Optimized filling pressures after diuresis. 2. Successful Cardiomems placement. 3. Severe 3 vessel disease. Anatomy is best-suited to CABG, but patient is s/p BKA and does not have a prosthesis yet (not mobile).  She would have a hard time successfully rehabing after a cardiac surgery. Discussed with interventional team, will tentatively plan intervention on RCA and LAD.  The RCA will require atherectomy.  Will aim for Friday, will load Plavix prior.   US RENAL  Result Date: 10/25/2019 CLINICAL DATA:  Acute renal failure EXAM: RENAL / URINARY TRACT ULTRASOUND COMPLETE COMPARISON:  None. FINDINGS: Right Kidney: Renal measurements: 13.5 x 5.7 x 4.7 cm. = volume: 188 mL . Echogenicity within normal limits. No mass or hydronephrosis visualized. Left Kidney: Renal measurements: 14.2 x 4.9 x 4.3 cm = volume: 157 mL. Echogenicity within normal limits. No mass or hydronephrosis visualized. Bladder: Decompressed by Foley catheter. Other: None. IMPRESSION: No acute abnormality noted. Electronically Signed   By: Inez Catalina M.D.   On: 10/25/2019 22:11   DG Chest Port 1 View  Result Date: 10/31/2019 CLINICAL DATA:  Shortness of breath. EXAM: PORTABLE CHEST 1 VIEW COMPARISON:  10/28/2019 FINDINGS: The right PICC line is stable. The cardiac silhouette, mediastinal and hilar contours are within normal limits and unchanged. Overall improved lung aeration with resolving edema,  atelectasis and effusions. IMPRESSION: Improving lung aeration with resolving edema, atelectasis and effusions. Electronically Signed   By: Marijo Sanes M.D.   On: 10/31/2019 09:19   DG Chest Port 1 View  Result Date: 10/28/2019 CLINICAL DATA:  Shortness of breath, COVID positive EXAM: PORTABLE CHEST 1 VIEW COMPARISON:  10/26/2019 FINDINGS: Increased density at the  lung bases. No pneumothorax. Stable cardiomediastinal contours. Right PICC line is again noted. IMPRESSION: Increased density at the lung bases probably reflecting a combination of pleural effusion and atelectasis. Electronically Signed   By: Macy Mis M.D.   On: 10/28/2019 07:50   DG Chest Port 1 View  Result Date: 10/26/2019 CLINICAL DATA:  Shortness of breath, COVID positive EXAM: PORTABLE CHEST 1 VIEW COMPARISON:  10/25/2019 FINDINGS: Right-sided PICC line terminates in the distal superior vena cava. Heart size is stable and normal accounting for projection. Increasing opacity at the left lung base without silhouetting of left hemidiaphragm. Subtle opacities are seen on a background of mild interstitial prominence worse in the left lung base, also present in right mid chest and lower chest. Visualized skeletal structures are unremarkable. IMPRESSION: 1. Increasing opacity at the left lung base without silhouetting of the left hemidiaphragm. Findings may represent developing effusion. 2. Subtle opacities in the right mid chest and lower chest. Findings could be seen in the setting of COVID-19 pneumonia. Continued follow-up suggested. Electronically Signed   By: Zetta Bills M.D.   On: 10/26/2019 08:59   DG Chest Port 1 View  Result Date: 10/25/2019 CLINICAL DATA:  Status post thoracentesis. EXAM: PORTABLE CHEST 1 VIEW COMPARISON:  Radiograph earlier this day. Lung bases from abdominal CT 10/22/2019 FINDINGS: Decreased left pleural effusion. No visualized pneumothorax. Hazy opacity at the right lung base likely small right pleural  effusion and atelectasis. Right upper extremity PICC remains in place. Borderline cardiomegaly with unchanged mediastinal contours. Improved pulmonary edema from earlier this day. Bones are under mineralized. IMPRESSION: 1. Decreased left pleural effusion after thoracentesis. No visualized pneumothorax. 2. Hazy opacity at the right lung base likely small right pleural effusion and atelectasis. 3. Improving pulmonary edema. Electronically Signed   By: Keith Rake M.D.   On: 10/25/2019 17:17   DG Chest Port 1 View  Result Date: 10/25/2019 CLINICAL DATA:  Status post thoracentesis. COVID positive. EXAM: PORTABLE CHEST 1 VIEW COMPARISON:  One-view chest x-ray 10/23/19 FINDINGS: Heart size is normal. Right-sided PICC line terminates in the distal SVC. Left pleural effusion and airspace disease is improved. No pneumothorax is present. Mild edema is present. IMPRESSION: 1. Improving left pleural effusion and airspace disease. 2. No pneumothorax. 3. Mild edema. Electronically Signed   By: San Morelle M.D.   On: 10/25/2019 06:57   DG CHEST PORT 1 VIEW  Result Date: 10/23/2019 CLINICAL DATA:  Acute respiratory insufficiency EXAM: PORTABLE CHEST 1 VIEW COMPARISON:  October 22, 2019 FINDINGS: The heart size and mediastinal contours are unchanged with mild cardiomegaly. There is a small left pleural effusion. There is slight interval improvement in the hazy/interstitial markings throughout both lungs. No acute osseous abnormality. IMPRESSION: Slight interval improvement in the bilateral hazy airspace opacities which could be due to pulmonary edema or infectious etiology. Small left pleural effusion. Electronically Signed   By: Prudencio Pair M.D.   On: 10/23/2019 06:32   DG Chest Port 1 View  Result Date: 10/22/2019 CLINICAL DATA:  CHF respiratory distress.  COVID-19 positive EXAM: PORTABLE CHEST 1 VIEW COMPARISON:  10/20/2019 FINDINGS: Diffuse bilateral airspace disease with basilar predominance shows mild  progression. Moderate left effusion has improved in the interval. Probable small right effusion. IMPRESSION: Mild progression of diffuse bilateral airspace disease which may represent edema or pneumonia. Improvement in left pleural effusion. Electronically Signed   By: Franchot Gallo M.D.   On: 10/22/2019 09:28   ECHOCARDIOGRAM COMPLETE  Result Date: 10/23/2019  ECHOCARDIOGRAM REPORT   Patient Name:   Jamie [name]  Date of Exam: 10/23/2019 Medical Rec #:  [id number]  Height:       63.0 in Accession #:    [id number] Weight:       156.5 lb Date of Birth:  10-Apr-1961 BSA:          1.742 m Patient Age:    14 years   BP:           124/65 mmHg Patient Gender: F          HR:           66 bpm. Exam Location:  Inpatient Procedure: 2D Echo, Cardiac Doppler and Color Doppler Indications:    I50.33 Acute on chronic diastolic (congestive) heart failure  History:        Patient has no prior history of Echocardiogram examinations.                 COPD, TIA and PAD; Risk Factors:Hypertension and Diabetes.                 COVID-19 Positive.  Sonographer:    Jonelle Sidle Dance Referring Phys: [place]  1. Left ventricular ejection fraction, by estimation, is 55 to 60%. The left ventricle has normal function. The left ventricle has no regional wall motion abnormalities. Left ventricular diastolic parameters are consistent with Grade II diastolic dysfunction (pseudonormalization). Elevated left atrial pressure.  2. Right ventricular systolic function is normal. The right ventricular size is normal. Tricuspid regurgitation signal is inadequate for assessing PA pressure.  3. Left atrial size was moderately dilated.  4. Large pleural effusion in the left lateral region.  5. The mitral valve is normal in structure and function. Mild mitral valve regurgitation.  6. The aortic valve is normal in structure and function. Aortic valve regurgitation is not visualized.  7. The inferior vena cava is dilated in size with >50%  respiratory variability, suggesting right atrial pressure of 8 mmHg. Comparison(s): No prior Echocardiogram. FINDINGS  Left Ventricle: Left ventricular ejection fraction, by estimation, is 55 to 60%. The left ventricle has normal function. The left ventricle has no regional wall motion abnormalities. The left ventricular internal cavity size was normal in size. There is  no left ventricular hypertrophy. Left ventricular diastolic parameters are consistent with Grade II diastolic dysfunction (pseudonormalization). Elevated left atrial pressure. Right Ventricle: The right ventricular size is normal. No increase in right ventricular wall thickness. Right ventricular systolic function is normal. Tricuspid regurgitation signal is inadequate for assessing PA pressure. Left Atrium: Left atrial size was moderately dilated. Right Atrium: Right atrial size was normal in size. Pericardium: There is no evidence of pericardial effusion. Mitral Valve: The mitral valve is normal in structure and function. Mild mitral annular calcification. Mild mitral valve regurgitation. Tricuspid Valve: The tricuspid valve is normal in structure. Tricuspid valve regurgitation is not demonstrated. Aortic Valve: The aortic valve is normal in structure and function. Aortic valve regurgitation is not visualized. Pulmonic Valve: The pulmonic valve was grossly normal. Pulmonic valve regurgitation is not visualized. Aorta: The aortic root and ascending aorta are structurally normal, with no evidence of dilitation. Venous: The inferior vena cava is dilated in size with greater than 50% respiratory variability, suggesting right atrial pressure of 8 mmHg. IAS/Shunts: No atrial level shunt detected by color flow Doppler. Additional Comments: There is a large pleural effusion in the left lateral region.  LEFT VENTRICLE PLAX 2D LVIDd:  3.79 cm  Diastology LVIDs:         2.87 cm  LV e' lateral:   6.00 cm/s LV PW:         1.27 cm  LV E/e' lateral: 18.3  LV IVS:        0.94 cm  LV e' medial:    3.57 cm/s LVOT diam:     1.80 cm  LV E/e' medial:  30.8 LV SV:         52 LV SV Index:   30 LVOT Area:     2.54 cm  RIGHT VENTRICLE            IVC RV Basal diam:  3.24 cm    IVC diam: 2.47 cm RV Mid diam:    1.67 cm RV S prime:     9.32 cm/s TAPSE (M-mode): 1.8 cm LEFT ATRIUM             Index       RIGHT ATRIUM           Index LA diam:        3.90 cm 2.24 cm/m  RA Area:     15.50 cm LA Vol (A2C):   79.2 ml 45.46 ml/m RA Volume:   42.40 ml  24.33 ml/m LA Vol (A4C):   61.2 ml 35.12 ml/m LA Biplane Vol: 73.7 ml 42.30 ml/m  AORTIC VALVE LVOT Vmax:   89.80 cm/s LVOT Vmean:  62.000 cm/s LVOT VTI:    0.204 m  AORTA Ao Root diam: 2.80 cm Ao Asc diam:  2.40 cm MITRAL VALVE MV Area (PHT): 3.72 cm     SHUNTS MV Decel Time: 204 msec     Systemic VTI:  0.20 m MV E velocity: 110.00 cm/s  Systemic Diam: 1.80 cm MV A velocity: 54.30 cm/s MV E/A ratio:  2.03 Mihai Croitoru MD Electronically signed by Sanda Klein MD Signature Date/Time: 10/23/2019/3:50:43 PM    Final    [place] EKG SITE RITE  Result Date: 10/22/2019 If Site Rite image not attached, placement could not be confirmed due to current cardiac rhythm.  US Abdomen Limited RUQ  Result Date: 10/22/2019 CLINICAL DATA:  Acute right upper quadrant abdominal pain. EXAM: ULTRASOUND ABDOMEN LIMITED RIGHT UPPER QUADRANT COMPARISON:  October 19, 2019. FINDINGS: Gallbladder: No gallstones or wall thickening visualized. No sonographic Murphy sign noted by sonographer. Common bile duct: Diameter: 5 mm which is within normal limits. Liver: No focal lesion identified. Within normal limits in parenchymal echogenicity. Portal vein is patent on color Doppler imaging with normal direction of blood flow towards the liver. Other: Minimal ascites is noted. IMPRESSION: Minimal ascites. No other abnormality seen in the right upper quadrant of the abdomen. Electronically Signed   By: Marijo Conception M.D.   On: 10/22/2019 09:03

## 2019-11-03 NOTE — Progress Notes (Signed)
Patient ID: Jamie [name], female   DOB: 01/04/1961, 59 y.o.   MRN: [id number]    Cardiology Rounding Note  PCP-Cardiologist: No primary care provider on file.   Subjective:    Patient tested positive for COVID-19. Oxygen has been titrated down to 2L [place].  She states that breathing is difficult, feels heavy, she has labored breathing, denies chest pain.     She has had right and left thoracenteses now, transudates.   Creatinine down to 0.97 today.   RHC/LHC + Cardiomems placement.  Coronary Findings  Diagnostic Dominance: Right Left Main  Mild 20% distal narrowing.  Left Anterior Descending  30% stenosis proximal LAD prior to D1. 50% stenosis LAD at D1. 80-90% stenosis mid LAD. 75% proximal D1 stenosis, moderate-large vessel.  Left Circumflex  Long 70% stenosis ostial to proximal LCx. Tiny OM1 with diffuse severe disease. Small OM2 with 99% proximal stenosis. 60% stenosis proximal PLOM.  Right Coronary Artery  Long area of calcified plaque from the proximal into the mid RCA. 90% stenosis proximally, 80% stenosis in the mid RCA. 80% stenosis mid PDA. 70% stenosis mid PLV.  Intervention  No interventions have been documented. Right Heart  Right Heart Pressures RHC Procedural Findings: Hemodynamics (mmHg) RA mean 2 RV 29/4 PA 27/10, mean 16 PCWP mean 13 LV 116/8 AO 113/53  Oxygen saturations: PA 69% AO 100%  Cardiac Output (Fick) 6.2  Cardiac Index (Fick) 3.63      Objective:   Weight Range: 68 kg Body mass index is 26.57 kg/m.   Vital Signs:   Temp:  [97.9 F (36.6 C)-99.8 F (37.7 C)] 99.8 F (37.7 C) (03/13 0352) Pulse Rate:  [64-281] 74 (03/13 0352) Resp:  [0-22] 14 (03/13 0352) BP: (105-152)/(54-105) 149/59 (03/13 0605) SpO2:  [0 %-100 %] 100 % (03/13 0352) Arterial Line BP: (156-166)/(66) 156/66 (03/12 2335) Weight:  [68 kg] 68 kg (03/13 0352) Last BM Date: 11/02/19  Weight change: Filed Weights   11/01/19 0536 11/02/19 0227 11/03/19 0352    Weight: 65.1 kg 66.8 kg 68 kg    Intake/Output:   Intake/Output Summary (Last 24 hours) at 11/03/2019 1129 Last data filed at 11/03/2019 0300 Gross per 24 hour  Intake 276.81 ml  Output --  Net 276.81 ml      Physical Exam    General: NAD Neck: No JVD, no thyromegaly or thyroid nodule.  Lungs: Clear to auscultation bilaterally with normal respiratory effort. CV: Nondisplaced PMI.  Heart regular S1/S2, no S3/S4, no murmur.  No peripheral edema.   Abdomen: Soft, nontender, no hepatosplenomegaly, no distention.  Skin: Intact without lesions or rashes.  Neurologic: Alert and oriented x 3.  Psych: Normal affect. Extremities: No clubbing or cyanosis. S/p R BKA.  HEENT: Normal.    Telemetry   NSR 70s, personally reviewed  Labs    CBC Recent Labs    11/02/19 0415 11/03/19 0440  WBC 7.2 8.0  NEUTROABS 5.6 6.8  HGB 8.1* 7.2*  HCT 25.9* 23.0*  MCV 79.7* 81.6  PLT 209 093   Basic Metabolic Panel Recent Labs    11/02/19 0415 11/03/19 0440  NA 135 129*  K 3.7 4.2  CL 101 99  CO2 24 22  GLUCOSE 148* 371*  BUN 13 9  CREATININE 1.09* 0.97  CALCIUM 8.5* 7.7*  MG 1.6* 1.9   Liver Function Tests Recent Labs    11/02/19 0415 11/03/19 0440  AST 18 52*  ALT 22 24  ALKPHOS 69 59  BILITOT 0.3 <0.1*  PROT 5.5* 4.7*  ALBUMIN 2.4* 2.1*   No results for input(s): LIPASE, AMYLASE in the last 72 hours. Cardiac Enzymes No results for input(s): CKTOTAL, CKMB, CKMBINDEX, TROPONINI in the last 72 hours.  BNP: BNP (last 3 results) Recent Labs    11/01/19 0416 11/02/19 0415 11/03/19 0440  BNP 179.2* 173.0* 472.3*    ProBNP (last 3 results) No results for input(s): PROBNP in the last 8760 hours.   D-Dimer No results for input(s): DDIMER in the last 72 hours. Hemoglobin A1C No results for input(s): HGBA1C in the last 72 hours. Fasting Lipid Panel Recent Labs    11/01/19 0416  CHOL 113  HDL 26*  LDLCALC 51  TRIG 179*  CHOLHDL 4.3   Thyroid Function  Tests No results for input(s): TSH, T4TOTAL, T3FREE, THYROIDAB in the last 72 hours.  Invalid input(s): FREET3  Other results:   Imaging    No results found.   Medications:     Scheduled Medications: . sodium chloride   Intravenous Once  . amLODipine  10 mg Oral Daily  . aspirin EC  81 mg Oral Daily  . atorvastatin  80 mg Oral QHS  . busPIRone  5 mg Oral Daily  . Chlorhexidine Gluconate Cloth  6 each Topical Daily  . clonazepam  0.25 mg Oral BID  . cloNIDine  0.3 mg Oral BID  . clopidogrel  75 mg Oral Daily  . FLUoxetine  40 mg Oral Daily  . folic acid  1 mg Oral Daily  . furosemide  60 mg Oral Daily  . heparin  5,000 Units Subcutaneous Q8H  . hydrALAZINE  100 mg Oral Q8H  . insulin aspart  0-5 Units Subcutaneous QHS  . insulin aspart  0-9 Units Subcutaneous TID WC  . insulin glargine  8 Units Subcutaneous Daily  . isosorbide mononitrate  60 mg Oral Daily  . labetalol  300 mg Oral BID  . multivitamin with minerals  1 tablet Oral Daily  . pantoprazole  40 mg Oral Daily  . rosuvastatin  20 mg Oral q1800  . sodium chloride flush  3 mL Intravenous Q12H  . sodium chloride flush  3 mL Intravenous Q12H  . spironolactone  25 mg Oral Daily  . thiamine  100 mg Oral Daily    Infusions: . sodium chloride      PRN Medications: sodium chloride, acetaminophen, albuterol, ALPRAZolam, hydrALAZINE, HYDROcodone-acetaminophen, ipratropium-albuterol, morphine injection, nitroGLYCERIN, ondansetron (ZOFRAN) IV, sodium chloride flush   Assessment/Plan   1. Acute hypoxemic respiratory failure: COVID-19 viral pneumonia + CHF exacerbation.  Weight was down with diuresis, and she was switched to PO lasix, but weight now up  3 kg in 2 days, labored breathing, I will switch back to ov lasix 40 mg BID. - Completed steroids and remdesivir for COVID-19.   2. Acute on chronic diastolic CHF: Multiple admissions over the last 6 months with hypertensive urgency/diastolic CHF.  Echo at [facility]  2/21 with EF 55-60%, restrictive diastolic function.  Echo here with EF 55-60%, normal RV, IVC suggesting RA pressure 8 mmHg. Creatinine 1.03 today, lower.  RHC 3/10 showed optimized filling pressures.  With multiple recent diastolic CHF admissions, we placed a Cardiomems device.  - restart lasix 40 mg iv BID as above, crea 0.97.   3. Pleural effusions: Bilateral.  Had thoracenteses at [facility] and again bilaterally here. Transudates with negative cytology. Suspect due to CHF.    4. HTN: Poorly controlled at home, admitted with hypertensive emergency/CHF.  HTN slowly getting under  better control.  - Continue amlodipine 10 mg daily.  - Continue hydralazine 100 mg tid. - Continue labetalol 300 bid - Continue clonidine 0.3 bid.   - Continue spironolactone 25 mg daily.  - Hold off on ACEI/ARB with AKI.  - Check renal artery dopplers to rule out renal artery stenosis.   5. AKI: Creatinine to 2.4 with diuresis, now 0.97.    6. Abdominal pain: CT abdomen earlier in admission with 1.5 cm liver lesion but no other significant abdominal abnormality.  She has had abdominal pain throughout this admission.  CT abdomen 3/1 showed no acute abdominal findings.   7. COPD: Per report on 3L home oxygen, now down to 2L and probably could continue to lower.   8. DM: SSI, per [facility].   9. CAD: Coronary angiography today showed severe, calcified 3 vessel disease.  This likely plays a role in her multiple episodes of flash pulmonary edema.  Ideally, based on this anatomy she would have CABG.  However, she has had right BKA and does not have a prosthesis. She underwent a successful PCI of the proximal to mid RCA with orbital atherectomy and stenting with a long 3.0 x 48 mm Synergy stent yesterday. Successful PCI of the mid LAD with DES x 1.  DAPT for at least one year. Further management per Heart failure team.  Hb down to 7.2 from 8.1 yesterday, 1 unit of PRBC has been ordered - Continue Crestor 20 mg daily.  -  Continue ASA, started Plavix.   Length of Stay: 59  Ena Dawley, MD  11/03/2019, 11:29 AM  Advanced Heart Failure Team Pager [phone] (M-F; 7a - 4p)  Please contact [facility] for night-coverage after hours (4p -7a ) and weekends on [email or web address]

## 2019-11-03 NOTE — Plan of Care (Signed)
  Problem: Clinical Measurements: Goal: Ability to maintain clinical measurements within normal limits will improve Outcome: Progressing Goal: Diagnostic test results will improve Outcome: Progressing Goal: Cardiovascular complication will be avoided Outcome: Progressing   Problem: Activity: Goal: Risk for activity intolerance will decrease Outcome: Progressing   Problem: Nutrition: Goal: Adequate nutrition will be maintained Outcome: Progressing   Problem: Coping: Goal: Level of anxiety will decrease Outcome: Progressing   Elesa Hacker, RN

## 2019-11-03 NOTE — Progress Notes (Signed)
RN removed femoral arterial sheath on 11/02/2019 at 2340 hours. RN held pressure for 20 minutes. Patient tolerated the procedure well. Patient had no complications. Femoral venous sheath was removed 11/03/2019 at 0005 hours. Hemostasis obtained within 10 minutes. Site of sheath soft to touch. No hematoma observed. Pressure dressing applied to site. Patient educated on the required bedrest, reporting feeling anything wet at site, as well as reporting pain. Primary RN will monitor site for the duration of shift.

## 2019-11-04 ENCOUNTER — Inpatient Hospital Stay (HOSPITAL_COMMUNITY): Payer: Medicaid Other

## 2019-11-04 DIAGNOSIS — D500 Iron deficiency anemia secondary to blood loss (chronic): Secondary | ICD-10-CM

## 2019-11-04 LAB — BASIC METABOLIC PANEL
Anion gap: 9 (ref 5–15)
BUN: 12 mg/dL (ref 6–20)
CO2: 24 mmol/L (ref 22–32)
Calcium: 8.1 mg/dL — ABNORMAL LOW (ref 8.9–10.3)
Chloride: 101 mmol/L (ref 98–111)
Creatinine, Ser: 0.95 mg/dL (ref 0.44–1.00)
GFR calc Af Amer: >60 mL/min (ref >60)
GFR calc non Af Amer: >60 mL/min (ref >60)
Glucose, Bld: 203 mg/dL — ABNORMAL HIGH (ref 70–99)
Potassium: 4.2 mmol/L (ref 3.5–5.1)
Sodium: 134 mmol/L — ABNORMAL LOW (ref 135–145)

## 2019-11-04 LAB — TYPE AND SCREEN
ABO/RH(D): B POS
Antibody Screen: NEGATIVE
Unit division: 0

## 2019-11-04 LAB — BPAM RBC
Blood Product Expiration Date: 202103132359
ISSUE DATE / TIME: 202103131717
Unit Type and Rh: 7300

## 2019-11-04 LAB — HEMOGLOBIN AND HEMATOCRIT, BLOOD
HCT: 24.1 % — ABNORMAL LOW (ref 36.0–46.0)
Hemoglobin: 7.9 g/dL — ABNORMAL LOW (ref 12.0–15.0)

## 2019-11-04 LAB — GLUCOSE, CAPILLARY
Glucose-Capillary: 199 mg/dL — ABNORMAL HIGH (ref 70–99)
Glucose-Capillary: 275 mg/dL — ABNORMAL HIGH (ref 70–99)
Glucose-Capillary: 154 mg/dL — ABNORMAL HIGH (ref 70–99)
Glucose-Capillary: 224 mg/dL — ABNORMAL HIGH (ref 70–99)
Glucose-Capillary: 208 mg/dL — ABNORMAL HIGH (ref 70–99)

## 2019-11-04 NOTE — Progress Notes (Signed)
PROGRESS NOTE                                                                                                                                                                                                             Patient Demographics:    Jamie [name], is a 59 y.o. 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    She was recently hospitalized twice to [facility] for shortness of breath, her work-up showed bilateral transudative pleural effusion, she had thoracentesis suggesting transudative fluid with no malignant cells.  Reason for recurrent effusion was thought to be acute on chronic diastolic CHF.  She was also found to have liver mass.  She was finally transferred to [facility] for cardiology evaluation for recurrent CHF.  Patient had right/left cardiac cath 3/10, significant for three-vessel disease, she is not a candidate for CABG, so plan for repeat cardiac cath with PCI planned for 3/12.  11/04/2019: Patient seen alongside patient's nurse.  Patient seems to have improved significantly.  PT/OT recommended skilled nursing facility placement.  However, patient wants to be assessed for possible CIR.  Will consult to CIR team.  Will repeat chest x-ray.  Renal artery duplex ultrasound result not visualized.    Subjective:   No shortness of breath No chest pain.  Assessment  & Plan :   Severe Acute hypoxic respiratory failure  due to recurrent Acute on chronic diastolic CHF EF 91% .   - Recurrent bilateral pleural effusions which are transudate, EF 60%.   - She has recurrent episodes of massive pulmonary edema, with recurrent pleural effusion, she required transfusion to ICU multiple times, she did require multiple centesis as well(occluding [facility] hospitalization for the same). [place] failure has improved, she is currently tolerating 2 L nasal cannula -Cardiology input greatly appreciated, please  see discussion below -Diuresis per cardiology, patient currently on 40 mg IV Lasix twice daily. 11/04/2019: Follow repeat chest x-ray.  Acute Covid 19 Viral Pneumonitis during the ongoing 2020 Covid 19 Pandemic  - her main disease process and hypoxia is due to #1 above which is acute on chronic diastolic CHF.  Her Covid disease itself seems to be mild to moderate for which he has completed her treatment with remdesivir and steroids.  SpO2: 100 % O2 Flow Rate (L/min): 2 L/min FiO2 (%): 40 %  Recent Labs  Lab 10/29/19 0309 10/29/19 0309 10/30/19 0426 10/30/19 0427 10/31/19 0410 11/01/19 0416 11/02/19 0415 11/03/19 0440  CRP 0.6   < > 0.6  --  0.6 0.6 1.0* 1.5*  DDIMER 3.82*  --  3.82*  --   --   --   --   --   BNP 334.7*   < >  --  361.3* 346.7* 179.2* 173.0* 472.3*  PROCALCITON 0.21  --  0.27  --   --   --   --   --    < > = values in this interval not displayed.    Hepatic Function Latest Ref Rng & Units 11/03/2019 11/02/2019 11/01/2019  Total Protein 6.5 - 8.1 g/dL 4.7(L) 5.5(L) 5.3(L)  Albumin 3.5 - 5.0 g/dL 2.1(L) 2.4(L) 2.4(L)  AST 15 - 41 U/L 52(H) 18 20  ALT 0 - 44 U/L 24 22 22   Alk Phosphatase 38 - 126 U/L 59 69 70  Total Bilirubin 0.3 - 1.2 mg/dL <0.1(L) 0.3 0.5    CAD -Already cath showing evidence of calcified three-vessel disease, this is likely contributing to her multiple episodes of flash pulmonary edema, ideally she would need CABG, but she is not a CABG candidate per cardiology . -  She  underwent a successful PCI of the proximal to mid RCA with orbital atherectomy and stenting with a long 3.0 x 48 mm Synergy stent. Successful PCI of the mid LAD with DES x 1.  - Start Crestor 20 mg daily.  - Continue ASA,  statin and Plavix . - DAPT for at least one year. Further management per Heart failure team.  - Hold off on ACEI/ARB with AKI.   PAD.   -S/p right BKA, CT evidence of abdominal diffuse calcification with recurrent abdominal pain.  Highly questionable for ischemic bowel, for now continue aspirin and statin for secondary prevention, clear liquid diet and monitor.  She likely has diffuse atherosclerosis due to uncontrolled diabetes mellitus and hypertension.   Hypertensive crisis.  -Stopped  IV Clevidipine drip, oral medications adjusted and as needed IV hydralazine added.  - Currently on combination of beta-blocker, Norvasc, clonidine, hydralazine along with Imdur. 11/04/2019: Blood pressure control is improving.  GERD with nausea.  Also has HX of diabetic gastroparesis.   - For now supportive care with clear liquid diet, PPI and nausea medications. 11/04/2019: No nausea vomiting reported overnight.  Dyslipidemia.  - Continue statin.   Anemia of chronic disease.  -Hemoglobin today 7.9 g/dL.    Depression and anxiety.   -Continue combination of bupropion, fluoxetine, buspirone and Xanax.  AKI  -Improving, continue to monitor closely especially with diuresis, and cardiac cath x2 back to back. 11/04/2019: Resolved.  Liver mass suspicious on CT scan.   - No mass noted on MRI.  Hypomagnesemia and hypokalemia.   - repleted  DM2 - placed on Lantus sliding scale along with Premeal NovoLog.  Monitor and adjust  Lab Results  Component Value Date  HGBA1C 6.9 (H) 10/23/2019     CBG (last 3)  Recent Labs    11/03/19 2043 11/04/19 0758 11/04/19 1202  GLUCAP 186* 199* 275*     Condition - Extremely Guarded  Family Communication  :  Daughter in law RN  @  [phone], called on 11/03/2019, unable to leave voicemail as I have full voicemail box  Code Status :  Full  Diet :   Diet Order            Diet Carb Modified Fluid consistency: Thin; Room service appropriate? Yes  Diet effective now               Disposition Plan  : Unstable for discharge, she remains with recurrent pleural effusions, plan for cardiac cath today  Consults  : Pulmonary, cardiology  Procedures  :    MRI abdomen.  No suspicious liver lesion.    Ultrasound-guided right-sided thoracentesis on 10/24/2019 with 750 cc of fluid removed.  Done by pulmonary.  Ultrasound guided left-sided thoracentesis on 10/25/2019 with 1300 cc of fluid removed.  Done by pulmonary  Renal ultrasound.  CT - 1. Persistent moderate to large bilateral pleural effusions with overlying atelectasis. 2. No acute abdominal/pelvic findings, mass lesions or adenopathy. 3. Severe/advanced atherosclerotic calcifications involving the aorta and branch vessels for age. 4. Small to moderate amount of persistent free pelvic fluid. No obvious cause. 5. Calcified uterine fibroids. Aortic Atherosclerosis.  TTE from [facility] - EF 60%, dCHF  PICC line and Foley  Requested.  Cardiac cath with stent placement 3/12.  PUD Prophylaxis : PPI  DVT Prophylaxis  :  Lovenox    Lab Results  Component Value Date   PLT 184 11/03/2019    Inpatient Medications  Scheduled Meds: . sodium chloride   Intravenous Once  . amLODipine  10 mg Oral Daily  . aspirin EC  81 mg Oral Daily  . atorvastatin  80 mg Oral QHS  . busPIRone  5 mg Oral Daily  . Chlorhexidine Gluconate Cloth  6 each Topical Daily  . clonazepam  0.25 mg Oral BID  . cloNIDine  0.3 mg Oral BID  . clopidogrel  75 mg Oral Daily  . FLUoxetine  40 mg Oral Daily  . folic acid  1 mg Oral Daily  . furosemide  40 mg Intravenous BID  . heparin  5,000 Units Subcutaneous Q8H  . hydrALAZINE  100 mg Oral Q8H  . insulin aspart  0-5 Units Subcutaneous QHS  .  insulin aspart  0-9 Units Subcutaneous TID WC  . insulin glargine  8 Units Subcutaneous Daily  . isosorbide mononitrate  60 mg Oral Daily  . labetalol  300 mg Oral BID  . multivitamin with minerals  1 tablet Oral Daily  . pantoprazole  40 mg Oral Daily  . rosuvastatin  20 mg Oral q1800  . sodium chloride flush  3 mL Intravenous Q12H  . sodium chloride flush  3 mL Intravenous Q12H  . spironolactone  25 mg Oral Daily  . thiamine  100 mg Oral Daily   Continuous Infusions: . sodium chloride     PRN Meds:.sodium chloride, acetaminophen, albuterol, ALPRAZolam, hydrALAZINE, HYDROcodone-acetaminophen, ipratropium-albuterol, morphine injection, nitroGLYCERIN, ondansetron (ZOFRAN) IV, sodium chloride flush  Antibiotics  :    Anti-infectives (From admission, onward)   Start     Dose/Rate Route Frequency Ordered Stop   10/23/19 1000  remdesivir 100 mg in sodium chloride 0.9 % 100 mL IVPB     100 mg 200 mL/hr  over 30 Minutes Intravenous Daily 10/22/19 1713 10/26/19 1006   10/22/19 1800  remdesivir 200 mg in sodium chloride 0.9% 250 mL IVPB     200 mg 580 mL/hr over 30 Minutes Intravenous Once 10/22/19 1713 10/22/19 1843   10/21/19 2100  cefTRIAXone (ROCEPHIN) 1 g in sodium chloride 0.9 % 100 mL IVPB  Status:  Discontinued     1 g 200 mL/hr over 30 Minutes Intravenous Every 24 hours 10/21/19 1744 10/22/19 0748   10/21/19 2100  azithromycin (ZITHROMAX) tablet 500 mg  Status:  Discontinued     500 mg Oral Daily 10/21/19 1744 10/22/19 0748       Bonnell Public M.D on 11/04/2019 at 1:29 PM  To page go to [email or web address] - password [facility]  [facility] -  Office  [phone]     See all Orders from today for further details    Objective:   Vitals:   11/03/19 2215 11/03/19 2256 11/04/19 0352 11/04/19 0752  BP: (!) 140/55 (!) 152/56 (!) 134/53 (!) 152/67  Pulse: 86 88 82 82  Resp: 17  15 18   Temp: 98.6 F (37 C)  98.6 F (37 C)   TempSrc: Oral  Oral   SpO2: 100%  98% 100%    Weight:   65.7 kg   Height:        Wt Readings from Last 3 Encounters:  11/04/19 65.7 kg     Intake/Output Summary (Last 24 hours) at 11/04/2019 1329 Last data filed at 11/04/2019 1040 Gross per 24 hour  Intake 1538 ml  Output 1400 ml  Net 138 ml     Physical Exam General condition: Stable.  Not in any distress. HEENT: Pallor.  No jaundice. Neck: Supple.  No JVD Lungs: Clear to auscultation CVS: S1-S2. Abdomen: Obese, soft and nontender.  Organs are not palpable. Neuro: Awake and alert.  Moves all extremities. Extremities: Right BKA.  No leg edema.   Data Review:    CBC Recent Labs  Lab 10/30/19 0426 10/30/19 0426 10/31/19 0410 10/31/19 1446 10/31/19 1448 11/01/19 0416 11/02/19 0415 11/03/19 0440 11/04/19 0030  WBC 6.6  --  6.9  --   --  8.4 7.2 8.0  --   HGB 8.4*   < > 8.7*   < > 8.8* 8.4* 8.1* 7.2* 7.9*  HCT 26.7*   < > 27.7*   < > 26.0* 27.2* 25.9* 23.0* 24.1*  PLT 222  --  240  --   --  198 209 184  --   MCV 80.2  --  80.1  --   --  81.2 79.7* 81.6  --   MCH 25.2*  --  25.1*  --   --  25.1* 24.9* 25.5*  --   MCHC 31.5  --  31.4  --   --  30.9 31.3 31.3  --   RDW 13.7  --  13.8  --   --  13.8 13.7 14.1  --   LYMPHSABS 0.7  --  0.9  --   --  0.5* 0.7 0.5*  --   MONOABS 0.5  --  0.6  --   --  0.6 0.7 0.5  --   EOSABS 0.2  --  0.1  --   --  0.2 0.2 0.1  --   BASOSABS 0.0  --  0.0  --   --  0.0 0.0 0.0  --    < > = values in this interval not displayed.    Chemistries  Recent Labs  Lab 10/30/19 0426 10/30/19 0426 10/31/19 0410 10/31/19 1446 10/31/19 1448 11/01/19 0416 11/02/19 0415 11/03/19 0440 11/04/19 0500  NA 134*   < > 137   < > 136 131* 135 129* 134*  K 3.4*   < > 4.2   < > 3.9 4.3 3.7 4.2 4.2  CL 99   < > 100  --   --  99 101 99 101  CO2 26   < > 26  --   --  24 24 22 24   GLUCOSE 276*   < > 175*  --   --  250* 148* 371* 203*  BUN 22*   < > 17  --   --  16 13 9 12   CREATININE 1.31*   < > 1.11*  --   --  1.03* 1.09* 0.97 0.95  CALCIUM  8.6*   < > 9.0  --   --  8.3* 8.5* 7.7* 8.1*  MG 1.6*  --  2.0  --   --  1.7 1.6* 1.9  --   AST 19  --  18  --   --  20 18 52*  --   ALT 19  --  18  --   --  22 22 24   --   ALKPHOS 71  --  67  --   --  70 69 59  --   BILITOT 0.3  --  0.4  --   --  0.5 0.3 <0.1*  --    < > = values in this interval not displayed.   ------------------------------------------------------------------------------------------------------------------ No results for input(s): CHOL, HDL, LDLCALC, TRIG, CHOLHDL, LDLDIRECT in the last 72 hours.  Lab Results  Component Value Date   HGBA1C 6.9 (H) 10/23/2019   ------------------------------------------------------------------------------------------------------------------ No results for input(s): TSH, T4TOTAL, T3FREE, THYROIDAB in the last 72 hours.  Invalid input(s): FREET3  Cardiac Enzymes No results for input(s): CKMB, TROPONINI, MYOGLOBIN in the last 168 hours.  Invalid input(s): CK ------------------------------------------------------------------------------------------------------------------    Component Value Date/Time   BNP 472.3 (H) 11/03/2019 0440    Micro Results Recent Results (from the past 240 hour(s))  Body fluid culture (includes gram stain)     Status: None   Collection Time: 10/25/19  4:29 PM   Specimen: Pleural Fluid  Result Value Ref Range Status   Specimen Description FLUID PLEURAL  Final   Special Requests NONE  Final   Gram Stain   Final    FEW WBC PRESENT, PREDOMINANTLY MONONUCLEAR NO ORGANISMS SEEN    Culture   Final    NO GROWTH 3 DAYS Performed at [facility], [place] [address], [place],  [address]    Report Status 10/28/2019 FINAL  Final    Radiology Reports MR ABDOMEN WO CONTRAST  Result Date: 10/28/2019 CLINICAL DATA:  59 year old female with history of liver lesion. Follow-up study. EXAM: MRI ABDOMEN WITHOUT CONTRAST TECHNIQUE: Multiplanar multisequence MR imaging was performed without the  administration of intravenous contrast. COMPARISON:  No prior abdominal MRI. CT the abdomen and pelvis 10/22/2019. FINDINGS: Comment: Today's study is limited by lack of IV gadolinium for detection and characterization of visceral and/or vascular lesions. Lower chest: Large right and moderate left pleural effusions. Signal in the visualize lung bases dependently, nonspecific, but favored to reflect areas of passive atelectasis. Hepatobiliary: No definite suspicious cystic or solid hepatic lesions are confidently identified on today's noncontrast examination. No intra or extrahepatic biliary ductal dilatation. Gallbladder is normal in appearance. Pancreas: No definite  pancreatic mass or peripancreatic fluid collections or inflammatory changes noted on today's noncontrast CT examination. Spleen:  Unremarkable. Adrenals/Urinary Tract: Unenhanced appearance of the kidneys and bilateral adrenal glands are normal. Stomach/Bowel: Visualized portions are unremarkable. Vascular/Lymphatic: No aneurysm identified in the visualized abdominal vasculature. No lymphadenopathy noted in the abdomen. Other: No significant volume of ascites noted in the visualized portions of the peritoneal cavity. Musculoskeletal: No aggressive appearing osseous lesions are noted in the visualized portions of the skeleton. Mild diffuse body wall edema. IMPRESSION: 1. The lesion of concern on the prior CT scan is not demonstrated on today's noncontrast MRI examination. This may suggest a benign perfusion anomaly on the prior CT scan. Regardless, this lesion is unlikely to be related to the patient's history of abdominal pain. If there is persistent clinical concern, this lesion could be further evaluated with repeat abdominal MRI with and without IV gadolinium. 2. Large right and moderate left pleural effusions with areas of probable passive atelectasis in the dependent portions of the lung bases bilaterally. 3. Mild diffuse body wall edema.  Electronically Signed   By: Vinnie Langton M.D.   On: 10/28/2019 08:17   CT ABDOMEN PELVIS W CONTRAST  Result Date: 10/22/2019 CLINICAL DATA:  Diffuse abdominal pain and anemia. EXAM: CT ABDOMEN AND PELVIS WITH CONTRAST TECHNIQUE: Multidetector CT imaging of the abdomen and pelvis was performed using the standard protocol following bolus administration of intravenous contrast. CONTRAST:  4m OMNIPAQUE IOHEXOL 300 MG/ML  SOLN COMPARISON:  CT scan 10/19/2019 FINDINGS: Lower chest: Persistent moderate to large bilateral pleural effusions with significant overlying atelectasis. The heart is normal in size. No pericardial effusion. Age advanced coronary artery calcifications are noted. Hepatobiliary: No focal hepatic lesions or intrahepatic biliary dilatation. Ill-defined area of contrast enhancement and segment 7 likely a vascular shunt. The gallbladder is mildly contracted. No common bile duct dilatation. Pancreas: Scattered calcifications or likely due to prior inflammation. No worrisome pancreatic lesions or acute inflammatory process. No ductal dilatation. Spleen: Normal size. No focal lesions. Adrenals/Urinary Tract: The adrenal glands and kidneys are unremarkable. No worrisome renal lesions or hydronephrosis. The bladder appears normal. Stomach/Bowel: The stomach, duodenum, small bowel and colon are grossly normal without oral contrast. No acute inflammatory changes, mass lesions or obstructive findings. Vascular/Lymphatic: Severe/advanced atherosclerotic calcifications involving the aorta and branch vessels for age. No aneurysm or dissection. The major venous structures are patent. No mesenteric or retroperitoneal mass or adenopathy. Reproductive: The uterus and ovaries are unremarkable. Calcified fibroid again noted posteriorly. Other: Small to moderate amount of free pelvic fluid is again demonstrated. No obvious cause. Musculoskeletal: Stable compression fracture of L1. No worrisome bone lesions. Age  advanced osteoporosis. IMPRESSION: 1. Persistent moderate to large bilateral pleural effusions with overlying atelectasis. 2. No acute abdominal/pelvic findings, mass lesions or adenopathy. 3. Severe/advanced atherosclerotic calcifications involving the aorta and branch vessels for age. 4. Small to moderate amount of persistent free pelvic fluid. No obvious cause. 5. Calcified uterine fibroids. Aortic Atherosclerosis (ICD10-I70.0). Electronically Signed   By: PMarijo SanesM.D.   On: 10/22/2019 13:56   CARDIAC CATHETERIZATION  Result Date: 11/02/2019  Prox RCA lesion is 90% stenosed.  Prox RCA to Mid RCA lesion is 70% stenosed.  Post intervention, there is a 0% residual stenosis.  A drug-eluting stent was successfully placed using a SYNERGY XD 3.0X48.  Post intervention, there is a 0% residual stenosis.  Mid LAD lesion is 90% stenosed.  Post intervention, there is a 0% residual stenosis.  A drug-eluting stent was  successfully placed using a SYNERGY XD 2.50X20.  1. Successful PCI of the proximal to mid RCA with orbital atherectomy and stenting with a long 3.0 x 48 mm Synergy stent 2. Successful PCI of the mid LAD with DES x 1. Plan: DAPT for at least one year. Further management per Heart failure team.   CARDIAC CATHETERIZATION  Result Date: 10/31/2019 1. Optimized filling pressures after diuresis. 2. Successful Cardiomems placement. 3. Severe 3 vessel disease. Anatomy is best-suited to CABG, but patient is s/p BKA and does not have a prosthesis yet (not mobile).  She would have a hard time successfully rehabing after a cardiac surgery. Discussed with interventional team, will tentatively plan intervention on RCA and LAD.  The RCA will require atherectomy.  Will aim for Friday, will load Plavix prior.   US RENAL  Result Date: 10/25/2019 CLINICAL DATA:  Acute renal failure EXAM: RENAL / URINARY TRACT ULTRASOUND COMPLETE COMPARISON:  None. FINDINGS: Right Kidney: Renal measurements: 13.5 x 5.7 x 4.7  cm. = volume: 188 mL . Echogenicity within normal limits. No mass or hydronephrosis visualized. Left Kidney: Renal measurements: 14.2 x 4.9 x 4.3 cm = volume: 157 mL. Echogenicity within normal limits. No mass or hydronephrosis visualized. Bladder: Decompressed by Foley catheter. Other: None. IMPRESSION: No acute abnormality noted. Electronically Signed   By: Inez Catalina M.D.   On: 10/25/2019 22:11   DG Chest Port 1 View  Result Date: 10/31/2019 CLINICAL DATA:  Shortness of breath. EXAM: PORTABLE CHEST 1 VIEW COMPARISON:  10/28/2019 FINDINGS: The right PICC line is stable. The cardiac silhouette, mediastinal and hilar contours are within normal limits and unchanged. Overall improved lung aeration with resolving edema, atelectasis and effusions. IMPRESSION: Improving lung aeration with resolving edema, atelectasis and effusions. Electronically Signed   By: Marijo Sanes M.D.   On: 10/31/2019 09:19   DG Chest Port 1 View  Result Date: 10/28/2019 CLINICAL DATA:  Shortness of breath, COVID positive EXAM: PORTABLE CHEST 1 VIEW COMPARISON:  10/26/2019 FINDINGS: Increased density at the lung bases. No pneumothorax. Stable cardiomediastinal contours. Right PICC line is again noted. IMPRESSION: Increased density at the lung bases probably reflecting a combination of pleural effusion and atelectasis. Electronically Signed   By: Macy Mis M.D.   On: 10/28/2019 07:50   DG Chest Port 1 View  Result Date: 10/26/2019 CLINICAL DATA:  Shortness of breath, COVID positive EXAM: PORTABLE CHEST 1 VIEW COMPARISON:  10/25/2019 FINDINGS: Right-sided PICC line terminates in the distal superior vena cava. Heart size is stable and normal accounting for projection. Increasing opacity at the left lung base without silhouetting of left hemidiaphragm. Subtle opacities are seen on a background of mild interstitial prominence worse in the left lung base, also present in right mid chest and lower chest. Visualized skeletal structures  are unremarkable. IMPRESSION: 1. Increasing opacity at the left lung base without silhouetting of the left hemidiaphragm. Findings may represent developing effusion. 2. Subtle opacities in the right mid chest and lower chest. Findings could be seen in the setting of COVID-19 pneumonia. Continued follow-up suggested. Electronically Signed   By: Zetta Bills M.D.   On: 10/26/2019 08:59   DG Chest Port 1 View  Result Date: 10/25/2019 CLINICAL DATA:  Status post thoracentesis. EXAM: PORTABLE CHEST 1 VIEW COMPARISON:  Radiograph earlier this day. Lung bases from abdominal CT 10/22/2019 FINDINGS: Decreased left pleural effusion. No visualized pneumothorax. Hazy opacity at the right lung base likely small right pleural effusion and atelectasis. Right upper extremity PICC remains  in place. Borderline cardiomegaly with unchanged mediastinal contours. Improved pulmonary edema from earlier this day. Bones are under mineralized. IMPRESSION: 1. Decreased left pleural effusion after thoracentesis. No visualized pneumothorax. 2. Hazy opacity at the right lung base likely small right pleural effusion and atelectasis. 3. Improving pulmonary edema. Electronically Signed   By: Keith Rake M.D.   On: 10/25/2019 17:17   DG Chest Port 1 View  Result Date: 10/25/2019 CLINICAL DATA:  Status post thoracentesis. COVID positive. EXAM: PORTABLE CHEST 1 VIEW COMPARISON:  One-view chest x-ray 10/23/19 FINDINGS: Heart size is normal. Right-sided PICC line terminates in the distal SVC. Left pleural effusion and airspace disease is improved. No pneumothorax is present. Mild edema is present. IMPRESSION: 1. Improving left pleural effusion and airspace disease. 2. No pneumothorax. 3. Mild edema. Electronically Signed   By: San Morelle M.D.   On: 10/25/2019 06:57   DG CHEST PORT 1 VIEW  Result Date: 10/23/2019 CLINICAL DATA:  Acute respiratory insufficiency EXAM: PORTABLE CHEST 1 VIEW COMPARISON:  October 22, 2019 FINDINGS: The  heart size and mediastinal contours are unchanged with mild cardiomegaly. There is a small left pleural effusion. There is slight interval improvement in the hazy/interstitial markings throughout both lungs. No acute osseous abnormality. IMPRESSION: Slight interval improvement in the bilateral hazy airspace opacities which could be due to pulmonary edema or infectious etiology. Small left pleural effusion. Electronically Signed   By: Prudencio Pair M.D.   On: 10/23/2019 06:32   DG Chest Port 1 View  Result Date: 10/22/2019 CLINICAL DATA:  CHF respiratory distress.  COVID-19 positive EXAM: PORTABLE CHEST 1 VIEW COMPARISON:  10/20/2019 FINDINGS: Diffuse bilateral airspace disease with basilar predominance shows mild progression. Moderate left effusion has improved in the interval. Probable small right effusion. IMPRESSION: Mild progression of diffuse bilateral airspace disease which may represent edema or pneumonia. Improvement in left pleural effusion. Electronically Signed   By: Franchot Gallo M.D.   On: 10/22/2019 09:28   ECHOCARDIOGRAM COMPLETE  Result Date: 10/23/2019    ECHOCARDIOGRAM REPORT   Patient Name:   Jamie [name]  Date of Exam: 10/23/2019 Medical Rec #:  [id number]  Height:       63.0 in Accession #:    [id number] Weight:       156.5 lb Date of Birth:  12-12-1960 BSA:          1.742 m Patient Age:    50 years   BP:           124/65 mmHg Patient Gender: F          HR:           66 bpm. Exam Location:  Inpatient Procedure: 2D Echo, Cardiac Doppler and Color Doppler Indications:    I50.33 Acute on chronic diastolic (congestive) heart failure  History:        Patient has no prior history of Echocardiogram examinations.                 COPD, TIA and PAD; Risk Factors:Hypertension and Diabetes.                 COVID-19 Positive.  Sonographer:    Jonelle Sidle Dance Referring Phys: [place]  1. Left ventricular ejection fraction, by estimation, is 55 to 60%. The left ventricle has normal  function. The left ventricle has no regional wall motion abnormalities. Left ventricular diastolic parameters are consistent with Grade II diastolic dysfunction (pseudonormalization). Elevated left atrial pressure.  2. Right  ventricular systolic function is normal. The right ventricular size is normal. Tricuspid regurgitation signal is inadequate for assessing PA pressure.  3. Left atrial size was moderately dilated.  4. Large pleural effusion in the left lateral region.  5. The mitral valve is normal in structure and function. Mild mitral valve regurgitation.  6. The aortic valve is normal in structure and function. Aortic valve regurgitation is not visualized.  7. The inferior vena cava is dilated in size with >50% respiratory variability, suggesting right atrial pressure of 8 mmHg. Comparison(s): No prior Echocardiogram. FINDINGS  Left Ventricle: Left ventricular ejection fraction, by estimation, is 55 to 60%. The left ventricle has normal function. The left ventricle has no regional wall motion abnormalities. The left ventricular internal cavity size was normal in size. There is  no left ventricular hypertrophy. Left ventricular diastolic parameters are consistent with Grade II diastolic dysfunction (pseudonormalization). Elevated left atrial pressure. Right Ventricle: The right ventricular size is normal. No increase in right ventricular wall thickness. Right ventricular systolic function is normal. Tricuspid regurgitation signal is inadequate for assessing PA pressure. Left Atrium: Left atrial size was moderately dilated. Right Atrium: Right atrial size was normal in size. Pericardium: There is no evidence of pericardial effusion. Mitral Valve: The mitral valve is normal in structure and function. Mild mitral annular calcification. Mild mitral valve regurgitation. Tricuspid Valve: The tricuspid valve is normal in structure. Tricuspid valve regurgitation is not demonstrated. Aortic Valve: The aortic valve is  normal in structure and function. Aortic valve regurgitation is not visualized. Pulmonic Valve: The pulmonic valve was grossly normal. Pulmonic valve regurgitation is not visualized. Aorta: The aortic root and ascending aorta are structurally normal, with no evidence of dilitation. Venous: The inferior vena cava is dilated in size with greater than 50% respiratory variability, suggesting right atrial pressure of 8 mmHg. IAS/Shunts: No atrial level shunt detected by color flow Doppler. Additional Comments: There is a large pleural effusion in the left lateral region.  LEFT VENTRICLE PLAX 2D LVIDd:         3.79 cm  Diastology LVIDs:         2.87 cm  LV e' lateral:   6.00 cm/s LV PW:         1.27 cm  LV E/e' lateral: 18.3 LV IVS:        0.94 cm  LV e' medial:    3.57 cm/s LVOT diam:     1.80 cm  LV E/e' medial:  30.8 LV SV:         52 LV SV Index:   30 LVOT Area:     2.54 cm  RIGHT VENTRICLE            IVC RV Basal diam:  3.24 cm    IVC diam: 2.47 cm RV Mid diam:    1.67 cm RV S prime:     9.32 cm/s TAPSE (M-mode): 1.8 cm LEFT ATRIUM             Index       RIGHT ATRIUM           Index LA diam:        3.90 cm 2.24 cm/m  RA Area:     15.50 cm LA Vol (A2C):   79.2 ml 45.46 ml/m RA Volume:   42.40 ml  24.33 ml/m LA Vol (A4C):   61.2 ml 35.12 ml/m LA Biplane Vol: 73.7 ml 42.30 ml/m  AORTIC VALVE LVOT Vmax:   89.80 cm/s LVOT  Vmean:  62.000 cm/s LVOT VTI:    0.204 m  AORTA Ao Root diam: 2.80 cm Ao Asc diam:  2.40 cm MITRAL VALVE MV Area (PHT): 3.72 cm     SHUNTS MV Decel Time: 204 msec     Systemic VTI:  0.20 m MV E velocity: 110.00 cm/s  Systemic Diam: 1.80 cm MV A velocity: 54.30 cm/s MV E/A ratio:  2.03 Mihai Croitoru MD Electronically signed by Sanda Klein MD Signature Date/Time: 10/23/2019/3:50:43 PM    Final    [place] EKG SITE RITE  Result Date: 10/22/2019 If Site Rite image not attached, placement could not be confirmed due to current cardiac rhythm.  US Abdomen Limited RUQ  Result Date: 10/22/2019 CLINICAL  DATA:  Acute right upper quadrant abdominal pain. EXAM: ULTRASOUND ABDOMEN LIMITED RIGHT UPPER QUADRANT COMPARISON:  October 19, 2019. FINDINGS: Gallbladder: No gallstones or wall thickening visualized. No sonographic Murphy sign noted by sonographer. Common bile duct: Diameter: 5 mm which is within normal limits. Liver: No focal lesion identified. Within normal limits in parenchymal echogenicity. Portal vein is patent on color Doppler imaging with normal direction of blood flow towards the liver. Other: Minimal ascites is noted. IMPRESSION: Minimal ascites. No other abnormality seen in the right upper quadrant of the abdomen. Electronically Signed   By: Marijo Conception M.D.   On: 10/22/2019 09:03

## 2019-11-04 NOTE — Progress Notes (Addendum)
Progress Note  Patient Name: Jamie [name] Date of Encounter: 11/04/2019  Primary Cardiologist: Dr. Meda Coffee, MD   Subjective   Breathing improved today. On 2L [place]. Denies SOB, CP.  Inpatient Medications    Scheduled Meds: . sodium chloride   Intravenous Once  . amLODipine  10 mg Oral Daily  . aspirin EC  81 mg Oral Daily  . atorvastatin  80 mg Oral QHS  . busPIRone  5 mg Oral Daily  . Chlorhexidine Gluconate Cloth  6 each Topical Daily  . clonazepam  0.25 mg Oral BID  . cloNIDine  0.3 mg Oral BID  . clopidogrel  75 mg Oral Daily  . FLUoxetine  40 mg Oral Daily  . folic acid  1 mg Oral Daily  . furosemide  40 mg Intravenous BID  . heparin  5,000 Units Subcutaneous Q8H  . hydrALAZINE  100 mg Oral Q8H  . insulin aspart  0-5 Units Subcutaneous QHS  . insulin aspart  0-9 Units Subcutaneous TID WC  . insulin glargine  8 Units Subcutaneous Daily  . isosorbide mononitrate  60 mg Oral Daily  . labetalol  300 mg Oral BID  . multivitamin with minerals  1 tablet Oral Daily  . pantoprazole  40 mg Oral Daily  . rosuvastatin  20 mg Oral q1800  . sodium chloride flush  3 mL Intravenous Q12H  . sodium chloride flush  3 mL Intravenous Q12H  . spironolactone  25 mg Oral Daily  . thiamine  100 mg Oral Daily   Continuous Infusions: . sodium chloride     PRN Meds: sodium chloride, acetaminophen, albuterol, ALPRAZolam, hydrALAZINE, HYDROcodone-acetaminophen, ipratropium-albuterol, morphine injection, nitroGLYCERIN, ondansetron (ZOFRAN) IV, sodium chloride flush   Vital Signs    Vitals:   11/03/19 2215 11/03/19 2256 11/04/19 0352 11/04/19 0752  BP: (!) 140/55 (!) 152/56 (!) 134/53 (!) 152/67  Pulse: 86 88 82 82  Resp: 17  15 18   Temp: 98.6 F (37 C)  98.6 F (37 C)   TempSrc: Oral  Oral   SpO2: 100%  98% 100%  Weight:   65.7 kg   Height:        Intake/Output Summary (Last 24 hours) at 11/04/2019 1031 Last data filed at 11/04/2019 1007 Gross per 24 hour  Intake 1778 ml    Output 1550 ml  Net 228 ml   Filed Weights   11/02/19 0227 11/03/19 0352 11/04/19 0352  Weight: 66.8 kg 68 kg 65.7 kg    Physical Exam   General: Well developed, well nourished, NAD Neck: Negative for carotid bruits. No JVD Lungs:Clear to ausculation bilaterally. Breathing is unlabored. Cardiovascular: RRR with S1 S2. No murmurs Abdomen: Soft, non-tender, non-distended. No obvious abdominal masses. Extremities: No edema. Radial pulses 2+ bilaterally Neuro: Alert and oriented. No focal deficits. No facial asymmetry. MAE spontaneously. Psych: Responds to questions appropriately with normal affect.    Labs    Chemistry Recent Labs  Lab 11/01/19 0416 11/01/19 0416 11/02/19 0415 11/03/19 0440 11/04/19 0500  NA 131*   < > 135 129* 134*  K 4.3   < > 3.7 4.2 4.2  CL 99   < > 101 99 101  CO2 24   < > 24 22 24   GLUCOSE 250*   < > 148* 371* 203*  BUN 16   < > 13 9 12   CREATININE 1.03*   < > 1.09* 0.97 0.95  CALCIUM 8.3*   < > 8.5* 7.7* 8.1*  PROT 5.3*  --  5.5* 4.7*  --   ALBUMIN 2.4*  --  2.4* 2.1*  --   AST 20  --  18 52*  --   ALT 22  --  22 24  --   ALKPHOS 70  --  69 59  --   BILITOT 0.5  --  0.3 <0.1*  --   GFRNONAA 59*   < > 56* >60 >60  GFRAA >60   < > >60 >60 >60  ANIONGAP 8   < > 10 8 9    < > = values in this interval not displayed.     Hematology Recent Labs  Lab 11/01/19 0416 11/01/19 0416 11/02/19 0415 11/03/19 0440 11/04/19 0030  WBC 8.4  --  7.2 8.0  --   RBC 3.35*  --  3.25* 2.82*  --   HGB 8.4*   < > 8.1* 7.2* 7.9*  HCT 27.2*   < > 25.9* 23.0* 24.1*  MCV 81.2  --  79.7* 81.6  --   MCH 25.1*  --  24.9* 25.5*  --   MCHC 30.9  --  31.3 31.3  --   RDW 13.8  --  13.7 14.1  --   PLT 198  --  209 184  --    < > = values in this interval not displayed.    Cardiac EnzymesNo results for input(s): TROPONINI in the last 168 hours. No results for input(s): TROPIPOC in the last 168 hours.   BNP Recent Labs  Lab 11/01/19 0416 11/02/19 0415  11/03/19 0440  BNP 179.2* 173.0* 472.3*     DDimer  Recent Labs  Lab 10/29/19 0309 10/30/19 0426  DDIMER 3.82* 3.82*     Radiology    CARDIAC CATHETERIZATION  Result Date: 11/02/2019  Prox RCA lesion is 90% stenosed.  Prox RCA to Mid RCA lesion is 70% stenosed.  Post intervention, there is a 0% residual stenosis.  A drug-eluting stent was successfully placed using a SYNERGY XD 3.0X48.  Post intervention, there is a 0% residual stenosis.  Mid LAD lesion is 90% stenosed.  Post intervention, there is a 0% residual stenosis.  A drug-eluting stent was successfully placed using a SYNERGY XD 2.50X20.  1. Successful PCI of the proximal to mid RCA with orbital atherectomy and stenting with a long 3.0 x 48 mm Synergy stent 2. Successful PCI of the mid LAD with DES x 1. Plan: DAPT for at least one year. Further management per Heart failure team.   Telemetry    11/04/19 NSR - Personally Reviewed  ECG    No new tracing as of 11/04/19- Personally Reviewed  Cardiac Studies   [facility] 10/31/19:  Optimized filling pressures after diuresis.  2. Successful Cardiomems placement.  3. Severe 3 vessel disease.   Anatomy is best-suited to CABG, but patient is s/p BKA and does not have a prosthesis yet (not mobile).  She would have a hard time successfully rehabing after a cardiac surgery. Discussed with interventional team, will tentatively plan intervention on RCA and LAD.  The RCA will require atherectomy.  Will aim for Friday, will load Plavix prior.   Stent intervention 11/02/19:    Prox RCA lesion is 90% stenosed.  Prox RCA to Mid RCA lesion is 70% stenosed.  Post intervention, there is a 0% residual stenosis.  A drug-eluting stent was successfully placed using a SYNERGY XD 3.0X48.  Post intervention, there is a 0% residual stenosis.  Mid LAD lesion is 90% stenosed.  Post intervention, there  is a 0% residual stenosis.  A drug-eluting stent was successfully placed using a SYNERGY  XD 2.50X20.   1. Successful PCI of the proximal to mid RCA with orbital atherectomy and stenting with a long 3.0 x 48 mm Synergy stent 2. Successful PCI of the mid LAD with DES x 1.   Plan: DAPT for at least one year. Further management per Heart failure team.   Patient Profile     59 y.o. 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   Assessment & Plan    1. Acute hypoxic respiratory failure failure: -Secondary to COVID 19 viral PNA+ CHF exacerbation  -Weight, 144lb today, down from 150lb 11/03/19 -I&O, net negative 8.3L -Weight increased yesterday therefore transitioned back to IV lasix 40 BID for an additional day>>>consider transition to PO today given stable weight  -Completed steroids and remdesivir for COVID-19 -Creatinine, 0.95 today   2. Acute on chronic diastolic HF: -Multiple admissions over the last 6 months with hypertensive urgency and diastolic HF  -Echocardiogram from [facility] 10/14/19 with EF at 55-60% with restrictive DD>>repeat echo here with stable EF, normal RV, IVC suggesting RA pressure 35mmHG -Creatinine, 0.95 -Underwent [facility] 10/31/19 with optimized filling pressures>>>Cardiomems in place  -IV Lasix restarted yesterday secondary to weight gain>>consider transition to PO today   3. Pleural effusions: -Underwent thoracentesis at [facility]>>repeat here with transudates negative for cytology  -Suspect CHF etiology    4. HTN: -Uncontrolled, 152/67>134/53>152/56>140/55 -Admitted multiple times with HTN urgency/emergency -Currently on amlodipine 10, hydralazine 100 TID, labetalol 300, clonidine 0.3, spiro 25 -No ACEI/ARB in the setting of AKI -Renal dopplers r/o renal artery stenosis  >>ordered, not yet complete   5. AKI:  -Resolved, creatinine peak at 2.4>>improved with diuresis -Creatinine today, 0.95  6. Abdominal pain:  -CT abdomen earlier in admission with 1.5 cm liver lesion but no other significant abdominal abnormality. She has had abdominal pain throughout this admission.  CT abdomen 3/1 showed no acute abdominal findings.   7. COPD:  -3L home oxygen, now down to 2L   8. DM:  -SSI for glucose control while inpatient status -Management per primary team   9. CAD:  -Coronary angiography showed severe, calcified 3 vessel disease>>with successful PCI of the proximal to mid RCA with orbital atherectomy and stenting and PCI of the mid LAD with DES x 1. With recommendations for DAPT for at least one year. -Hb down to 7.2 from 8.1 yesterday, 1 unit of PRBC has been ordered -Continue Crestor 20 mg daily -Continue ASA, started Plavix -Continue IMDUR  Signed, Kathyrn Drown NP-C [place] Pager: [phone] 11/04/2019, 10:31 AM     For questions or updates, please contact   Please consult [email or web address] for contact info under Cardiology/STEMI.  The patient was seen, examined and discussed with Kathyrn Drown, NP  and I agree with the above.   The patient is feeling significantly better today, we switched her to IV Lasix yesterday, she is down recorded properly but her lung sounds are significantly improved.  We will switch to p.o. Lasix tomorrow.  She received 1 unit of packed red blood cells yesterday with improvement of hemoglobin from 7.2-7.9.  Her O2 sats have improved, she now only requires 2 L of oxygen via nasal cannula.  Ena Dawley, MD  11/04/2019  

## 2019-11-05 ENCOUNTER — Inpatient Hospital Stay (HOSPITAL_COMMUNITY): Payer: Medicaid Other

## 2019-11-05 DIAGNOSIS — I150 Renovascular hypertension: Secondary | ICD-10-CM

## 2019-11-05 DIAGNOSIS — R404 Transient alteration of awareness: Secondary | ICD-10-CM

## 2019-11-05 LAB — GLUCOSE, CAPILLARY
Glucose-Capillary: 24 mg/dL — CL (ref 70–99)
Glucose-Capillary: 306 mg/dL — ABNORMAL HIGH (ref 70–99)
Glucose-Capillary: 313 mg/dL — ABNORMAL HIGH (ref 70–99)
Glucose-Capillary: 306 mg/dL — ABNORMAL HIGH (ref 70–99)
Glucose-Capillary: 228 mg/dL — ABNORMAL HIGH (ref 70–99)
Glucose-Capillary: 297 mg/dL — ABNORMAL HIGH (ref 70–99)

## 2019-11-05 LAB — COMPREHENSIVE METABOLIC PANEL
ALT: 23 U/L (ref 0–44)
AST: 27 U/L (ref 15–41)
Albumin: 2.4 g/dL — ABNORMAL LOW (ref 3.5–5.0)
Alkaline Phosphatase: 67 U/L (ref 38–126)
Anion gap: 11 (ref 5–15)
BUN: 12 mg/dL (ref 6–20)
CO2: 24 mmol/L (ref 22–32)
Calcium: 8.4 mg/dL — ABNORMAL LOW (ref 8.9–10.3)
Chloride: 96 mmol/L — ABNORMAL LOW (ref 98–111)
Creatinine, Ser: 1.34 mg/dL — ABNORMAL HIGH (ref 0.44–1.00)
GFR calc Af Amer: 50 mL/min — ABNORMAL LOW (ref >60)
GFR calc non Af Amer: 43 mL/min — ABNORMAL LOW (ref >60)
Glucose, Bld: 222 mg/dL — ABNORMAL HIGH (ref 70–99)
Potassium: 4.3 mmol/L (ref 3.5–5.1)
Sodium: 131 mmol/L — ABNORMAL LOW (ref 135–145)
Total Bilirubin: 0.7 mg/dL (ref 0.3–1.2)
Total Protein: 5.5 g/dL — ABNORMAL LOW (ref 6.5–8.1)

## 2019-11-05 LAB — CBC
HCT: 24.7 % — ABNORMAL LOW (ref 36.0–46.0)
Hemoglobin: 8.1 g/dL — ABNORMAL LOW (ref 12.0–15.0)
MCH: 26.3 pg (ref 26.0–34.0)
MCHC: 32.8 g/dL (ref 30.0–36.0)
MCV: 80.2 fL (ref 80.0–100.0)
Platelets: 199 10*3/uL (ref 150–400)
RBC: 3.08 MIL/uL — ABNORMAL LOW (ref 3.87–5.11)
RDW: 14.1 % (ref 11.5–15.5)
WBC: 8.7 10*3/uL (ref 4.0–10.5)
nRBC: 0 % (ref 0.0–0.2)

## 2019-11-05 LAB — TROPONIN I (HIGH SENSITIVITY)
Troponin I (High Sensitivity): 3521 ng/L (ref <18)
Troponin I (High Sensitivity): 3338 ng/L (ref <18)

## 2019-11-05 LAB — BLOOD GAS, ARTERIAL
Acid-Base Excess: 3.3 mmol/L — ABNORMAL HIGH (ref 0.0–2.0)
Bicarbonate: 27.6 mmol/L (ref 20.0–28.0)
FIO2: 21
O2 Saturation: 69.4 %
Patient temperature: 37.2
pCO2 arterial: 44.5 mmHg (ref 32.0–48.0)
pH, Arterial: 7.41 (ref 7.350–7.450)
pO2, Arterial: 38.3 mmHg — CL (ref 83.0–108.0)

## 2019-11-05 LAB — POCT ACTIVATED CLOTTING TIME
Activated Clotting Time: 169 seconds
Activated Clotting Time: 197 seconds
Activated Clotting Time: 202 seconds
Activated Clotting Time: 208 seconds

## 2019-11-05 MED ORDER — LABETALOL HCL 200 MG PO TABS: 400.0000 mg | ORAL_TABLET | Freq: Two times a day (BID) | ORAL | Status: DC

## 2019-11-05 MED ORDER — INSULIN GLARGINE 100 UNIT/ML ~~LOC~~ SOLN
20.0000 [IU] | Freq: Every day | SUBCUTANEOUS | Status: DC
  Administered 2019-11-05 – 2019-11-06 (×2): 20 [IU] via SUBCUTANEOUS
  Filled 2019-11-05 (×2): qty 0.2

## 2019-11-05 MED ORDER — LABETALOL HCL 200 MG PO TABS
300.0000 mg | ORAL_TABLET | Freq: Two times a day (BID) | ORAL | Status: DC
  Administered 2019-11-05 – 2019-11-06 (×2): 300 mg via ORAL
  Filled 2019-11-05 (×2): qty 1

## 2019-11-05 NOTE — Progress Notes (Signed)
Inpatient Rehabilitation Admissions Coordinator  Inpatient rehab consult received. I met with patient at bedside and spoke with her daughter in law, Estill Bamberg by phone. Family is unable to provide the needed assistance at home for her to return at this time after a short inpt rehab admit. Amber is requesting return to SNF. We recommend SNF and possible palliative consult. We will sign off at this time.   Danne Baxter, RN, MSN Rehab Admissions Coordinator [phone] 11/05/2019 4:39 PM

## 2019-11-05 NOTE — TOC Initial Note (Signed)
Transition of Care [facility]) - Initial/Assessment Note    Patient Details  Name: Jamie [name] MRN: [id number] Date of Birth: Aug 22, 1961  [facility] of [facility]) CM/SW Contact:    Jamie Merles, LCSW Phone Number: 11/05/2019, 3:32 PM  Clinical Narrative:                 CSW received consult for possible SNF placement upon discharge. CSW met with patient bedside and discussed recommendation. Patient confirmed she is from [place]. Patient stated she is in agreement to return to [place]. Expressed no further questions at this time.  CSW contacted [facility] [phone] and spoke with a representative, who confirmed with Jamie [name] that patient is able to return once medically cleared. TOC team will continue to follow and assist with discharge planning needs.  Expected Discharge Plan: Skilled Nursing Facility Barriers to Discharge: Continued Medical Work up   Patient Goals and CMS Choice Patient states their goals for this hospitalization and ongoing recovery are:: to get stronger CMS Medicare.gov Compare Post Acute Care list provided to:: Patient Choice offered to / list presented to : Patient  Expected Discharge Plan and Services Expected Discharge Plan: [place] arrangements for the past 2 months: [place], [place]                                      Prior Living Arrangements/Services Living arrangements for the past 2 months: [place], [place] Lives with:: Facility Resident Patient language and need for interpreter reviewed:: Yes Do you feel safe going back to the place where you live?: Yes      Need for Family Participation in Patient Care: No (Comment)     Criminal Activity/Legal Involvement Pertinent to Current Situation/Hospitalization: No - Comment as needed  Activities of Daily Living Home Assistive Devices/Equipment: Eyeglasses, Wheelchair ADL Screening (condition at  time of admission) Patient's cognitive ability adequate to safely complete daily activities?: Yes Is the patient deaf or have difficulty hearing?: No Does the patient have difficulty seeing, even when wearing glasses/contacts?: No Does the patient have difficulty concentrating, remembering, or making decisions?: No Patient able to express need for assistance with ADLs?: Yes Does the patient have difficulty dressing or bathing?: Yes Independently performs ADLs?: No Communication: Independent Dressing (OT): Independent Grooming: Independent Feeding: Independent Bathing: Needs assistance Is this a change from baseline?: Pre-admission baseline Toileting: Needs assistance Is this a change from baseline?: Pre-admission baseline In/Out Bed: Needs assistance Is this a change from baseline?: Pre-admission baseline Does the patient have difficulty walking or climbing stairs?: Yes Weakness of Legs: Both Weakness of Arms/Hands: None  Permission Sought/Granted Permission sought to share information with : Investment banker, corporate granted to share info w AGENCY: SNF        Emotional Assessment Appearance:: Appears stated age Attitude/Demeanor/Rapport: Unable to Assess Affect (typically observed): Flat Orientation: : Oriented to Self, Oriented to Place, Oriented to  Time, Oriented to Situation Alcohol / Substance Use: Not Applicable Psych Involvement: No (comment)  Admission diagnosis:  CHF (congestive heart failure) ([place]) [I50.9] Patient Active Problem List   Diagnosis Date Noted  . Acute respiratory insufficiency   . Chronic diastolic HF (heart failure) ([place]) 10/21/2019  . HTN (hypertension) 10/21/2019  . PAD (peripheral artery disease) ([place]) 10/21/2019  . CHF (congestive heart failure) (  [place]) 10/21/2019  . Acute on chronic diastolic CHF (congestive heart failure) ([place])    PCP:  Patient, No Pcp Per Pharmacy:  No Pharmacies Listed    Social Determinants  of Health (SDOH) Interventions    Readmission Risk Interventions No flowsheet data found.

## 2019-11-05 NOTE — Progress Notes (Signed)
PT Cancellation Note  Patient Details Name: Jamie [name] MRN: [id number] DOB: 03-19-1961   Cancelled Treatment:    Reason Eval/Treat Not Completed: Medical issues which prohibited therapy. RN reports patient has had some episodes of decreased level of awareness. They will be running some tests and asks that PT be held at this time.  Will continue to follow and see when appropriate.    [name] 11/05/2019, 10:12 AM

## 2019-11-05 NOTE — Progress Notes (Incomplete)
EEG complete - results pending 

## 2019-11-05 NOTE — Progress Notes (Signed)
Renal artery duplex completed. Refer to "CV Proc" under chart review to view preliminary results.  11/05/2019 9:54 AM Kelby Aline., MHA, RVT, RDCS, RDMS

## 2019-11-05 NOTE — Progress Notes (Signed)
OT Cancellation Note  Patient Details Name: Jamie [name] MRN: [id number] DOB: 1961-05-07   Cancelled Treatment:    Reason Eval/Treat Not Completed: Medical issues which prohibited therapy RN.  reports patient has had some episodes of decreased level of awareness. Further workup pending.  Will follow and see as able/appropraite.   [name] Artist, OT Acute Rehabilitation Services Pager [phone] Office [phone]    Delight Stare 11/05/2019, 11:09 AM

## 2019-11-05 NOTE — Progress Notes (Signed)
PROGRESS NOTE                                                                                                                                                                                                             Patient Demographics:    Jamie [name], is a 59 y.o. 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    She was recently hospitalized twice to [facility] for shortness of breath, her work-up showed bilateral transudative pleural effusion, she had thoracentesis suggesting transudative fluid with no malignant cells.  Reason for recurrent effusion was thought to be acute on chronic diastolic CHF.  She was also found to have liver mass.  She was finally transferred to [facility] for cardiology evaluation for recurrent CHF.  Patient had right/left cardiac cath 3/10, significant for three-vessel disease, she is not a candidate for CABG, so plan for repeat cardiac cath with PCI planned for 3/12.  11/04/2019: Patient seen alongside patient's nurse.  Patient seems to have improved significantly.  PT/OT recommended skilled nursing facility placement.  However, patient wants to be assessed for possible CIR.  Will consult to CIR team.  Will repeat chest x-ray.  Renal artery duplex ultrasound result not visualized.  11/05/2019: Patient seen alongside patient's nurse.  Patient was reported to have had a brief episode of unresponsiveness.   Apparently, patient has had similar episode in the past.  CT brain done without contrast was nonrevealing.  EEG completed, but result is pending.  Discussed with patient's daughter-in-law extensively.  Prognosis is guarded.  Will consult palliative care team.  Input from CIR team/rehab team is noted.  Skilled nursing facility recommended.   Subjective:   No shortness of breath No chest pain.  Assessment  & Plan :   Severe Acute hypoxic respiratory failure due to  recurrent Acute on chronic diastolic CHF EF 02% .   - Recurrent bilateral pleural effusions which are transudate, EF 60%.   - She has recurrent episodes of massive pulmonary edema, with recurrent pleural effusion, she required transfusion to ICU multiple times, she did require multiple centesis as well(occluding [facility] hospitalization for the same). [place] failure has improved, she is currently tolerating 2 L nasal cannula -Cardiology input greatly appreciated, please see discussion below -Diuresis per cardiology, patient currently on 40 mg IV Lasix twice daily. 11/04/2019: Follow repeat chest x-ray. 11/05/2019: Chest x-ray only revealed slight increase in haziness on the right side.  Acute Covid 19 Viral Pneumonitis during the ongoing 2020 Covid 19 Pandemic  - her main disease process and hypoxia is due to #1 above which is acute on chronic diastolic CHF.  Her Covid disease itself seems to be mild to moderate for which he has completed her treatment with remdesivir and steroids.  SpO2: 100 % O2 Flow Rate (L/min): 4 L/min FiO2 (%): 40 %  Recent Labs  Lab 10/30/19 0426 10/30/19 0427 10/31/19 0410 11/01/19 0416 11/02/19 0415 11/03/19 0440  CRP 0.6  --  0.6 0.6 1.0* 1.5*  DDIMER 3.82*  --   --   --   --   --   BNP  --  361.3* 346.7* 179.2* 173.0* 472.3*  PROCALCITON 0.27  --   --   --   --   --     Hepatic Function Latest Ref Rng & Units 11/04/2019 11/03/2019 11/02/2019  Total Protein 6.5 - 8.1 g/dL 5.5(L) 4.7(L) 5.5(L)    Albumin 3.5 - 5.0 g/dL 2.4(L) 2.1(L) 2.4(L)  AST 15 - 41 U/L 27 52(H) 18  ALT 0 - 44 U/L 23 24 22   Alk Phosphatase 38 - 126 U/L 67 59 69  Total Bilirubin 0.3 - 1.2 mg/dL 0.7 <0.1(L) 0.3    CAD -Already cath showing evidence of calcified three-vessel disease, this is likely contributing to her multiple episodes of flash pulmonary edema, ideally she would need CABG, but she is not a CABG candidate per cardiology . -  She underwent a successful PCI of the proximal to mid RCA with orbital atherectomy and stenting with a long 3.0 x 48 mm Synergy stent. Successful PCI of the mid LAD with DES x 1.  - Start Crestor 20 mg daily.  - Continue ASA,  statin and Plavix . - DAPT for at least one year. Further management per Heart failure team.  - Hold off on ACEI/ARB with AKI. 11/05/2019: Cardiology is managing.  Input is appreciated.  PAD.   -S/p right BKA, CT evidence of abdominal diffuse calcification with recurrent abdominal pain.  Highly questionable for ischemic bowel, for now continue aspirin and statin for secondary prevention, clear liquid diet and monitor.  She likely has diffuse atherosclerosis due to uncontrolled diabetes mellitus and hypertension. 11/05/2019: Stable.  Hypertensive crisis.  -Stopped  IV Clevidipine drip, oral medications adjusted and as needed IV hydralazine added.  - Currently on combination of beta-blocker, Norvasc, clonidine, hydralazine along with Imdur. 11/04/2019: Blood pressure control is improving.  GERD with nausea.  Also has HX of diabetic gastroparesis.   - For now supportive care with clear liquid diet, PPI and nausea medications. 11/04/2019: No nausea vomiting reported overnight.  Dyslipidemia.  - Continue statin.   Anemia of chronic disease.  -Hemoglobin today 7.9 g/dL.    Depression and anxiety.   -Continue combination of bupropion, fluoxetine, buspirone and Xanax.  AKI  -Improving, continue to  monitor closely especially with diuresis, and cardiac cath  x2 back to back. 11/04/2019: Resolved.  Liver mass suspicious on CT scan.   - No mass noted on MRI.  Hypomagnesemia and hypokalemia.   - repleted  DM2 - placed on Lantus sliding scale along with Premeal NovoLog.  Monitor and adjust  Brief period of unresponsiveness: -CT head is non-revealing -Follow results of EEG. -Also assessed by the cardiology team.  Lab Results  Component Value Date   HGBA1C 6.9 (H) 10/23/2019     CBG (last 3)  Recent Labs    11/05/19 0813 11/05/19 1127 11/05/19 1631  GLUCAP 313* 306* 228*     Condition - Extremely Guarded  Family Communication  :  Daughter in law RN  @ [phone], called on 11/03/2019, unable to leave voicemail as I have full voicemail box  Code Status :  Full  Diet :   Diet Order            Diet Carb Modified Fluid consistency: Thin; Room service appropriate? Yes  Diet effective now               Disposition Plan  : Unstable for discharge, she remains with recurrent pleural effusions, plan for cardiac cath today  Consults  : Pulmonary, cardiology  Procedures  :    MRI abdomen.  No suspicious liver lesion.    Ultrasound-guided right-sided thoracentesis on 10/24/2019 with 750 cc of fluid removed.  Done by pulmonary.  Ultrasound guided left-sided thoracentesis on 10/25/2019 with 1300 cc of fluid removed.  Done by pulmonary  Renal ultrasound.  CT - 1. Persistent moderate to large bilateral pleural effusions with overlying atelectasis. 2. No acute abdominal/pelvic findings, mass lesions or adenopathy. 3. Severe/advanced atherosclerotic calcifications involving the aorta and branch vessels for age. 4. Small to moderate amount of persistent free pelvic fluid. No obvious cause. 5. Calcified uterine fibroids. Aortic Atherosclerosis.  TTE from [facility] - EF 60%, dCHF  PICC line and Foley  Requested.  Cardiac cath with stent placement 3/12.  PUD Prophylaxis : PPI  DVT Prophylaxis  :  Lovenox    Lab Results  Component  Value Date   PLT 199 11/04/2019    Inpatient Medications  Scheduled Meds: . sodium chloride   Intravenous Once  . amLODipine  10 mg Oral Daily  . aspirin EC  81 mg Oral Daily  . atorvastatin  80 mg Oral QHS  . busPIRone  5 mg Oral Daily  . Chlorhexidine Gluconate Cloth  6 each Topical Daily  . clonazepam  0.25 mg Oral BID  . cloNIDine  0.3 mg Oral BID  . clopidogrel  75 mg Oral Daily  . FLUoxetine  40 mg Oral Daily  . folic acid  1 mg Oral Daily  . heparin  5,000 Units Subcutaneous Q8H  . hydrALAZINE  100 mg Oral Q8H  . insulin aspart  0-5 Units Subcutaneous QHS  . insulin aspart  0-9 Units Subcutaneous TID WC  . insulin glargine  20 Units Subcutaneous Daily  . isosorbide mononitrate  60 mg Oral Daily  . labetalol  300 mg Oral BID  . multivitamin with minerals  1 tablet Oral Daily  . pantoprazole  40 mg Oral Daily  . rosuvastatin  20 mg Oral q1800  . sodium chloride flush  3 mL Intravenous Q12H  . sodium chloride flush  3 mL Intravenous Q12H  . thiamine  100 mg Oral Daily   Continuous Infusions: . sodium chloride  PRN Meds:.sodium chloride, acetaminophen, albuterol, ALPRAZolam, hydrALAZINE, HYDROcodone-acetaminophen, ipratropium-albuterol, morphine injection, nitroGLYCERIN, ondansetron (ZOFRAN) IV, sodium chloride flush  Antibiotics  :    Anti-infectives (From admission, onward)   Start     Dose/Rate Route Frequency Ordered Stop   10/23/19 1000  remdesivir 100 mg in sodium chloride 0.9 % 100 mL IVPB     100 mg 200 mL/hr over 30 Minutes Intravenous Daily 10/22/19 1713 10/26/19 1006   10/22/19 1800  remdesivir 200 mg in sodium chloride 0.9% 250 mL IVPB     200 mg 580 mL/hr over 30 Minutes Intravenous Once 10/22/19 1713 10/22/19 1843   10/21/19 2100  cefTRIAXone (ROCEPHIN) 1 g in sodium chloride 0.9 % 100 mL IVPB  Status:  Discontinued     1 g 200 mL/hr over 30 Minutes Intravenous Every 24 hours 10/21/19 1744 10/22/19 0748   10/21/19 2100  azithromycin (ZITHROMAX)  tablet 500 mg  Status:  Discontinued     500 mg Oral Daily 10/21/19 1744 10/22/19 0748       Bonnell Public M.D on 11/05/2019 at 6:16 PM  To page go to [email or web address] - password [facility]  [facility] -  Office  [phone]     See all Orders from today for further details    Objective:   Vitals:   11/05/19 1335 11/05/19 1537 11/05/19 1633 11/05/19 1756  BP: (!) 145/56 (!) 123/97 (!) 141/58 (!) 142/57  Pulse: 74  74   Resp: 11 17    Temp:   98.9 F (37.2 C)   TempSrc:   Oral   SpO2: 100%  100%   Weight:      Height:        Wt Readings from Last 3 Encounters:  11/05/19 65.1 kg     Intake/Output Summary (Last 24 hours) at 11/05/2019 1816 Last data filed at 11/05/2019 1549 Gross per 24 hour  Intake 243 ml  Output 1850 ml  Net -1607 ml     Physical Exam General condition: Stable.  Not in any distress. HEENT: Pallor.  No jaundice. Neck: Supple.  No JVD Lungs: Clear to auscultation CVS: S1-S2. Abdomen: Obese, soft and nontender.  Organs are not palpable. Neuro: Awake and alert.  Moves all extremities. Extremities: Right BKA.  No leg edema.   Data Review:    CBC Recent Labs  Lab 10/30/19 0426 10/30/19 0426 10/31/19 0410 10/31/19 1446 11/01/19 0416 11/02/19 0415 11/03/19 0440 11/04/19 0030 11/04/19 2310  WBC 6.6   < > 6.9  --  8.4 7.2 8.0  --  8.7  HGB 8.4*   < > 8.7*   < > 8.4* 8.1* 7.2* 7.9* 8.1*  HCT 26.7*   < > 27.7*   < > 27.2* 25.9* 23.0* 24.1* 24.7*  PLT 222   < > 240  --  198 209 184  --  199  MCV 80.2   < > 80.1  --  81.2 79.7* 81.6  --  80.2  MCH 25.2*   < > 25.1*  --  25.1* 24.9* 25.5*  --  26.3  MCHC 31.5   < > 31.4  --  30.9 31.3 31.3  --  32.8  RDW 13.7   < > 13.8  --  13.8 13.7 14.1  --  14.1  LYMPHSABS 0.7  --  0.9  --  0.5* 0.7 0.5*  --   --   MONOABS 0.5  --  0.6  --  0.6 0.7 0.5  --   --  EOSABS 0.2  --  0.1  --  0.2 0.2 0.1  --   --   BASOSABS 0.0  --  0.0  --  0.0 0.0 0.0  --   --    < > = values in this interval not  displayed.    Chemistries  Recent Labs  Lab 10/30/19 0426 10/30/19 0426 10/31/19 0410 10/31/19 1446 11/01/19 0416 11/02/19 0415 11/03/19 0440 11/04/19 0500 11/04/19 2310  NA 134*   < > 137   < > 131* 135 129* 134* 131*  K 3.4*   < > 4.2   < > 4.3 3.7 4.2 4.2 4.3  CL 99   < > 100  --  99 101 99 101 96*  CO2 26   < > 26  --  24 24 22 24 24   GLUCOSE 276*   < > 175*  --  250* 148* 371* 203* 222*  BUN 22*   < > 17  --  16 13 9 12 12   CREATININE 1.31*   < > 1.11*  --  1.03* 1.09* 0.97 0.95 1.34*  CALCIUM 8.6*   < > 9.0  --  8.3* 8.5* 7.7* 8.1* 8.4*  MG 1.6*  --  2.0  --  1.7 1.6* 1.9  --   --   AST 19   < > 18  --  20 18 52*  --  27  ALT 19   < > 18  --  22 22 24   --  23  ALKPHOS 71   < > 67  --  70 69 59  --  67  BILITOT 0.3   < > 0.4  --  0.5 0.3 <0.1*  --  0.7   < > = values in this interval not displayed.   ------------------------------------------------------------------------------------------------------------------ No results for input(s): CHOL, HDL, LDLCALC, TRIG, CHOLHDL, LDLDIRECT in the last 72 hours.  Lab Results  Component Value Date   HGBA1C 6.9 (H) 10/23/2019   ------------------------------------------------------------------------------------------------------------------ No results for input(s): TSH, T4TOTAL, T3FREE, THYROIDAB in the last 72 hours.  Invalid input(s): FREET3  Cardiac Enzymes No results for input(s): CKMB, TROPONINI, MYOGLOBIN in the last 168 hours.  Invalid input(s): CK ------------------------------------------------------------------------------------------------------------------    Component Value Date/Time   BNP 472.3 (H) 11/03/2019 0440    Micro Results No results found for this or any previous visit (from the past 240 hour(s)).  Radiology Reports DG Chest 2 View  Result Date: 11/04/2019 CLINICAL DATA:  Shortness of breath. CHF. EXAM: CHEST - 2 VIEW COMPARISON:  10/31/2019 and prior radiographs FINDINGS: The  cardiomediastinal silhouette is unremarkable. A RIGHT PICC line is present with tip overlying the LOWER SVC. Pulmonary artery monitor/sensor overlying the RIGHT interlobar pulmonary artery is now noted. Bilateral pleural effusions and LEFT LOWER lung atelectasis/consolidation again noted. No pneumothorax or new pulmonary opacities. IMPRESSION: 1. No significant change of bilateral pleural effusions and LEFT LOWER lung consolidation/atelectasis. 2. Pulmonary artery monitor/sensor overlying the RIGHT interlobar pulmonary artery. Electronically Signed   By: Margarette Canada M.D.   On: 11/04/2019 15:17   CT HEAD WO CONTRAST  Result Date: 11/05/2019 CLINICAL DATA:  TIA EXAM: CT HEAD WITHOUT CONTRAST TECHNIQUE: Contiguous axial images were obtained from the base of the skull through the vertex without intravenous contrast. COMPARISON:  CT head 10/18/2019 FINDINGS: Brain: Ventricle size and cerebral volume normal. Negative for acute or chronic infarction. Negative for hemorrhage. Vascular: 15 mm low-density cystic lesion within the superior sagittal sinus is unchanged. Negative for hyperdense  vessel Skull: Negative Sinuses/Orbits: Mild mucosal edema sphenoid sinus. Remaining sinuses clear. Negative orbit Other: None IMPRESSION: No acute intracranial abnormality. Stable 15 mm cyst in the superior sagittal sinus. Favor arachnoid granulation. Electronically Signed   By: Franchot Gallo M.D.   On: 11/05/2019 10:52   MR ABDOMEN WO CONTRAST  Result Date: 10/28/2019 CLINICAL DATA:  59 year old female with history of liver lesion. Follow-up study. EXAM: MRI ABDOMEN WITHOUT CONTRAST TECHNIQUE: Multiplanar multisequence MR imaging was performed without the administration of intravenous contrast. COMPARISON:  No prior abdominal MRI. CT the abdomen and pelvis 10/22/2019. FINDINGS: Comment: Today's study is limited by lack of IV gadolinium for detection and characterization of visceral and/or vascular lesions. Lower chest: Large right  and moderate left pleural effusions. Signal in the visualize lung bases dependently, nonspecific, but favored to reflect areas of passive atelectasis. Hepatobiliary: No definite suspicious cystic or solid hepatic lesions are confidently identified on today's noncontrast examination. No intra or extrahepatic biliary ductal dilatation. Gallbladder is normal in appearance. Pancreas: No definite pancreatic mass or peripancreatic fluid collections or inflammatory changes noted on today's noncontrast CT examination. Spleen:  Unremarkable. Adrenals/Urinary Tract: Unenhanced appearance of the kidneys and bilateral adrenal glands are normal. Stomach/Bowel: Visualized portions are unremarkable. Vascular/Lymphatic: No aneurysm identified in the visualized abdominal vasculature. No lymphadenopathy noted in the abdomen. Other: No significant volume of ascites noted in the visualized portions of the peritoneal cavity. Musculoskeletal: No aggressive appearing osseous lesions are noted in the visualized portions of the skeleton. Mild diffuse body wall edema. IMPRESSION: 1. The lesion of concern on the prior CT scan is not demonstrated on today's noncontrast MRI examination. This may suggest a benign perfusion anomaly on the prior CT scan. Regardless, this lesion is unlikely to be related to the patient's history of abdominal pain. If there is persistent clinical concern, this lesion could be further evaluated with repeat abdominal MRI with and without IV gadolinium. 2. Large right and moderate left pleural effusions with areas of probable passive atelectasis in the dependent portions of the lung bases bilaterally. 3. Mild diffuse body wall edema. Electronically Signed   By: Vinnie Langton M.D.   On: 10/28/2019 08:17   CT ABDOMEN PELVIS W CONTRAST  Result Date: 10/22/2019 CLINICAL DATA:  Diffuse abdominal pain and anemia. EXAM: CT ABDOMEN AND PELVIS WITH CONTRAST TECHNIQUE: Multidetector CT imaging of the abdomen and pelvis was  performed using the standard protocol following bolus administration of intravenous contrast. CONTRAST:  57m OMNIPAQUE IOHEXOL 300 MG/ML  SOLN COMPARISON:  CT scan 10/19/2019 FINDINGS: Lower chest: Persistent moderate to large bilateral pleural effusions with significant overlying atelectasis. The heart is normal in size. No pericardial effusion. Age advanced coronary artery calcifications are noted. Hepatobiliary: No focal hepatic lesions or intrahepatic biliary dilatation. Ill-defined area of contrast enhancement and segment 7 likely a vascular shunt. The gallbladder is mildly contracted. No common bile duct dilatation. Pancreas: Scattered calcifications or likely due to prior inflammation. No worrisome pancreatic lesions or acute inflammatory process. No ductal dilatation. Spleen: Normal size. No focal lesions. Adrenals/Urinary Tract: The adrenal glands and kidneys are unremarkable. No worrisome renal lesions or hydronephrosis. The bladder appears normal. Stomach/Bowel: The stomach, duodenum, small bowel and colon are grossly normal without oral contrast. No acute inflammatory changes, mass lesions or obstructive findings. Vascular/Lymphatic: Severe/advanced atherosclerotic calcifications involving the aorta and branch vessels for age. No aneurysm or dissection. The major venous structures are patent. No mesenteric or retroperitoneal mass or adenopathy. Reproductive: The uterus and ovaries are unremarkable. Calcified  fibroid again noted posteriorly. Other: Small to moderate amount of free pelvic fluid is again demonstrated. No obvious cause. Musculoskeletal: Stable compression fracture of L1. No worrisome bone lesions. Age advanced osteoporosis. IMPRESSION: 1. Persistent moderate to large bilateral pleural effusions with overlying atelectasis. 2. No acute abdominal/pelvic findings, mass lesions or adenopathy. 3. Severe/advanced atherosclerotic calcifications involving the aorta and branch vessels for age. 4.  Small to moderate amount of persistent free pelvic fluid. No obvious cause. 5. Calcified uterine fibroids. Aortic Atherosclerosis (ICD10-I70.0). Electronically Signed   By: Marijo Sanes M.D.   On: 10/22/2019 13:56   CARDIAC CATHETERIZATION  Result Date: 11/02/2019  Prox RCA lesion is 90% stenosed.  Prox RCA to Mid RCA lesion is 70% stenosed.  Post intervention, there is a 0% residual stenosis.  A drug-eluting stent was successfully placed using a SYNERGY XD 3.0X48.  Post intervention, there is a 0% residual stenosis.  Mid LAD lesion is 90% stenosed.  Post intervention, there is a 0% residual stenosis.  A drug-eluting stent was successfully placed using a SYNERGY XD 2.50X20.  1. Successful PCI of the proximal to mid RCA with orbital atherectomy and stenting with a long 3.0 x 48 mm Synergy stent 2. Successful PCI of the mid LAD with DES x 1. Plan: DAPT for at least one year. Further management per Heart failure team.   CARDIAC CATHETERIZATION  Result Date: 10/31/2019 1. Optimized filling pressures after diuresis. 2. Successful Cardiomems placement. 3. Severe 3 vessel disease. Anatomy is best-suited to CABG, but patient is s/p BKA and does not have a prosthesis yet (not mobile).  She would have a hard time successfully rehabing after a cardiac surgery. Discussed with interventional team, will tentatively plan intervention on RCA and LAD.  The RCA will require atherectomy.  Will aim for Friday, will load Plavix prior.   US RENAL  Result Date: 10/25/2019 CLINICAL DATA:  Acute renal failure EXAM: RENAL / URINARY TRACT ULTRASOUND COMPLETE COMPARISON:  None. FINDINGS: Right Kidney: Renal measurements: 13.5 x 5.7 x 4.7 cm. = volume: 188 mL . Echogenicity within normal limits. No mass or hydronephrosis visualized. Left Kidney: Renal measurements: 14.2 x 4.9 x 4.3 cm = volume: 157 mL. Echogenicity within normal limits. No mass or hydronephrosis visualized. Bladder: Decompressed by Foley catheter. Other:  None. IMPRESSION: No acute abnormality noted. Electronically Signed   By: Inez Catalina M.D.   On: 10/25/2019 22:11   DG CHEST PORT 1 VIEW  Result Date: 11/04/2019 CLINICAL DATA:  Shortness of breath. EXAM: PORTABLE CHEST 1 VIEW COMPARISON:  Radiograph earlier this day. CT 10/01/2019 FINDINGS: Right upper extremity PICC tip in the SVC. Pulmonary artery monitoring sensor projects over the right inter lobar pulmonary artery, unchanged. Hazy left lung base opacity consistent with pleural effusion and atelectasis, similar to prior. Slight increase in hazy right lung base opacity from prior. Unchanged heart size and mediastinal contours. No pneumothorax. IMPRESSION: 1. Slight increase in hazy right lung base opacity, likely increased pleural effusion. 2. Unchanged left pleural effusion and basilar opacity. Electronically Signed   By: Keith Rake M.D.   On: 11/04/2019 23:41   DG Chest Port 1 View  Result Date: 10/31/2019 CLINICAL DATA:  Shortness of breath. EXAM: PORTABLE CHEST 1 VIEW COMPARISON:  10/28/2019 FINDINGS: The right PICC line is stable. The cardiac silhouette, mediastinal and hilar contours are within normal limits and unchanged. Overall improved lung aeration with resolving edema, atelectasis and effusions. IMPRESSION: Improving lung aeration with resolving edema, atelectasis and effusions. Electronically Signed  By: Marijo Sanes M.D.   On: 10/31/2019 09:19   DG Chest Port 1 View  Result Date: 10/28/2019 CLINICAL DATA:  Shortness of breath, COVID positive EXAM: PORTABLE CHEST 1 VIEW COMPARISON:  10/26/2019 FINDINGS: Increased density at the lung bases. No pneumothorax. Stable cardiomediastinal contours. Right PICC line is again noted. IMPRESSION: Increased density at the lung bases probably reflecting a combination of pleural effusion and atelectasis. Electronically Signed   By: Macy Mis M.D.   On: 10/28/2019 07:50   DG Chest Port 1 View  Result Date: 10/26/2019 CLINICAL DATA:   Shortness of breath, COVID positive EXAM: PORTABLE CHEST 1 VIEW COMPARISON:  10/25/2019 FINDINGS: Right-sided PICC line terminates in the distal superior vena cava. Heart size is stable and normal accounting for projection. Increasing opacity at the left lung base without silhouetting of left hemidiaphragm. Subtle opacities are seen on a background of mild interstitial prominence worse in the left lung base, also present in right mid chest and lower chest. Visualized skeletal structures are unremarkable. IMPRESSION: 1. Increasing opacity at the left lung base without silhouetting of the left hemidiaphragm. Findings may represent developing effusion. 2. Subtle opacities in the right mid chest and lower chest. Findings could be seen in the setting of COVID-19 pneumonia. Continued follow-up suggested. Electronically Signed   By: Zetta Bills M.D.   On: 10/26/2019 08:59   DG Chest Port 1 View  Result Date: 10/25/2019 CLINICAL DATA:  Status post thoracentesis. EXAM: PORTABLE CHEST 1 VIEW COMPARISON:  Radiograph earlier this day. Lung bases from abdominal CT 10/22/2019 FINDINGS: Decreased left pleural effusion. No visualized pneumothorax. Hazy opacity at the right lung base likely small right pleural effusion and atelectasis. Right upper extremity PICC remains in place. Borderline cardiomegaly with unchanged mediastinal contours. Improved pulmonary edema from earlier this day. Bones are under mineralized. IMPRESSION: 1. Decreased left pleural effusion after thoracentesis. No visualized pneumothorax. 2. Hazy opacity at the right lung base likely small right pleural effusion and atelectasis. 3. Improving pulmonary edema. Electronically Signed   By: Keith Rake M.D.   On: 10/25/2019 17:17   DG Chest Port 1 View  Result Date: 10/25/2019 CLINICAL DATA:  Status post thoracentesis. COVID positive. EXAM: PORTABLE CHEST 1 VIEW COMPARISON:  One-view chest x-ray 10/23/19 FINDINGS: Heart size is normal. Right-sided PICC  line terminates in the distal SVC. Left pleural effusion and airspace disease is improved. No pneumothorax is present. Mild edema is present. IMPRESSION: 1. Improving left pleural effusion and airspace disease. 2. No pneumothorax. 3. Mild edema. Electronically Signed   By: San Morelle M.D.   On: 10/25/2019 06:57   DG CHEST PORT 1 VIEW  Result Date: 10/23/2019 CLINICAL DATA:  Acute respiratory insufficiency EXAM: PORTABLE CHEST 1 VIEW COMPARISON:  October 22, 2019 FINDINGS: The heart size and mediastinal contours are unchanged with mild cardiomegaly. There is a small left pleural effusion. There is slight interval improvement in the hazy/interstitial markings throughout both lungs. No acute osseous abnormality. IMPRESSION: Slight interval improvement in the bilateral hazy airspace opacities which could be due to pulmonary edema or infectious etiology. Small left pleural effusion. Electronically Signed   By: Prudencio Pair M.D.   On: 10/23/2019 06:32   DG Chest Port 1 View  Result Date: 10/22/2019 CLINICAL DATA:  CHF respiratory distress.  COVID-19 positive EXAM: PORTABLE CHEST 1 VIEW COMPARISON:  10/20/2019 FINDINGS: Diffuse bilateral airspace disease with basilar predominance shows mild progression. Moderate left effusion has improved in the interval. Probable small right effusion. IMPRESSION: Mild  progression of diffuse bilateral airspace disease which may represent edema or pneumonia. Improvement in left pleural effusion. Electronically Signed   By: Franchot Gallo M.D.   On: 10/22/2019 09:28   ECHOCARDIOGRAM COMPLETE  Result Date: 10/23/2019    ECHOCARDIOGRAM REPORT   Patient Name:   Jamie [name]  Date of Exam: 10/23/2019 Medical Rec #:  [id number]  Height:       63.0 in Accession #:    [id number] Weight:       156.5 lb Date of Birth:  1961-04-08 BSA:          1.742 m Patient Age:    22 years   BP:           124/65 mmHg Patient Gender: F          HR:           66 bpm. Exam Location:  Inpatient Procedure:  2D Echo, Cardiac Doppler and Color Doppler Indications:    I50.33 Acute on chronic diastolic (congestive) heart failure  History:        Patient has no prior history of Echocardiogram examinations.                 COPD, TIA and PAD; Risk Factors:Hypertension and Diabetes.                 COVID-19 Positive.  Sonographer:    Jonelle Sidle Dance Referring Phys: [place]  1. Left ventricular ejection fraction, by estimation, is 55 to 60%. The left ventricle has normal function. The left ventricle has no regional wall motion abnormalities. Left ventricular diastolic parameters are consistent with Grade II diastolic dysfunction (pseudonormalization). Elevated left atrial pressure.  2. Right ventricular systolic function is normal. The right ventricular size is normal. Tricuspid regurgitation signal is inadequate for assessing PA pressure.  3. Left atrial size was moderately dilated.  4. Large pleural effusion in the left lateral region.  5. The mitral valve is normal in structure and function. Mild mitral valve regurgitation.  6. The aortic valve is normal in structure and function. Aortic valve regurgitation is not visualized.  7. The inferior vena cava is dilated in size with >50% respiratory variability, suggesting right atrial pressure of 8 mmHg. Comparison(s): No prior Echocardiogram. FINDINGS  Left Ventricle: Left ventricular ejection fraction, by estimation, is 55 to 60%. The left ventricle has normal function. The left ventricle has no regional wall motion abnormalities. The left ventricular internal cavity size was normal in size. There is  no left ventricular hypertrophy. Left ventricular diastolic parameters are consistent with Grade II diastolic dysfunction (pseudonormalization). Elevated left atrial pressure. Right Ventricle: The right ventricular size is normal. No increase in right ventricular wall thickness. Right ventricular systolic function is normal. Tricuspid regurgitation signal is  inadequate for assessing PA pressure. Left Atrium: Left atrial size was moderately dilated. Right Atrium: Right atrial size was normal in size. Pericardium: There is no evidence of pericardial effusion. Mitral Valve: The mitral valve is normal in structure and function. Mild mitral annular calcification. Mild mitral valve regurgitation. Tricuspid Valve: The tricuspid valve is normal in structure. Tricuspid valve regurgitation is not demonstrated. Aortic Valve: The aortic valve is normal in structure and function. Aortic valve regurgitation is not visualized. Pulmonic Valve: The pulmonic valve was grossly normal. Pulmonic valve regurgitation is not visualized. Aorta: The aortic root and ascending aorta are structurally normal, with no evidence of dilitation. Venous: The inferior vena cava is dilated in size with greater than  50% respiratory variability, suggesting right atrial pressure of 8 mmHg. IAS/Shunts: No atrial level shunt detected by color flow Doppler. Additional Comments: There is a large pleural effusion in the left lateral region.  LEFT VENTRICLE PLAX 2D LVIDd:         3.79 cm  Diastology LVIDs:         2.87 cm  LV e' lateral:   6.00 cm/s LV PW:         1.27 cm  LV E/e' lateral: 18.3 LV IVS:        0.94 cm  LV e' medial:    3.57 cm/s LVOT diam:     1.80 cm  LV E/e' medial:  30.8 LV SV:         52 LV SV Index:   30 LVOT Area:     2.54 cm  RIGHT VENTRICLE            IVC RV Basal diam:  3.24 cm    IVC diam: 2.47 cm RV Mid diam:    1.67 cm RV S prime:     9.32 cm/s TAPSE (M-mode): 1.8 cm LEFT ATRIUM             Index       RIGHT ATRIUM           Index LA diam:        3.90 cm 2.24 cm/m  RA Area:     15.50 cm LA Vol (A2C):   79.2 ml 45.46 ml/m RA Volume:   42.40 ml  24.33 ml/m LA Vol (A4C):   61.2 ml 35.12 ml/m LA Biplane Vol: 73.7 ml 42.30 ml/m  AORTIC VALVE LVOT Vmax:   89.80 cm/s LVOT Vmean:  62.000 cm/s LVOT VTI:    0.204 m  AORTA Ao Root diam: 2.80 cm Ao Asc diam:  2.40 cm MITRAL VALVE MV Area  (PHT): 3.72 cm     SHUNTS MV Decel Time: 204 msec     Systemic VTI:  0.20 m MV E velocity: 110.00 cm/s  Systemic Diam: 1.80 cm MV A velocity: 54.30 cm/s MV E/A ratio:  2.03 Mihai Croitoru MD Electronically signed by Sanda Klein MD Signature Date/Time: 10/23/2019/3:50:43 PM    Final    VAS US RENAL ARTERY DUPLEX  Result Date: 11/05/2019 ABDOMINAL VISCERAL Indications: Hypertension Limitations: Air/bowel gas. Comparison Study: No prior study Performing Technologist: Maudry Mayhew MHA, RDMS, RVT, RDCS  Examination Guidelines: A complete evaluation includes B-mode imaging, spectral Doppler, color Doppler, and power Doppler as needed of all accessible portions of each vessel. Bilateral testing is considered an integral part of a complete examination. Limited examinations for reoccurring indications may be performed as noted.  Duplex Findings: +--------------------+--------+--------+--------+--------+ Mesenteric          PSV cm/sEDV cm/s Plaque Comments +--------------------+--------+--------+--------+--------+ Aorta Prox             78      14                    +--------------------+--------+--------+--------+--------+ Celiac Artery Origin  385      98   calcific         +--------------------+--------+--------+--------+--------+ SMA Proximal          246      24                    +--------------------+--------+--------+--------+--------+    +------------------+--------+--------+-------+ Right Renal ArteryPSV cm/sEDV cm/sComment +------------------+--------+--------+-------+ Origin  58      21           +------------------+--------+--------+-------+ Proximal            197      24           +------------------+--------+--------+-------+ Mid                 178      20           +------------------+--------+--------+-------+ Distal              159      17           +------------------+--------+--------+-------+  +-----------------+--------+--------+-------+ Left Renal ArteryPSV cm/sEDV cm/sComment +-----------------+--------+--------+-------+ Origin              69      13           +-----------------+--------+--------+-------+ Proximal           131      27           +-----------------+--------+--------+-------+ Mid                147      30           +-----------------+--------+--------+-------+ Distal              75      13           +-----------------+--------+--------+-------+ +------------+--------+--------+----+-----------+--------+--------+----+ Right KidneyPSV cm/sEDV cm/sRI  Left KidneyPSV cm/sEDV cm/sRI   +------------+--------+--------+----+-----------+--------+--------+----+ Upper Pole  31      3       0.89Upper Pole 25      4       0.83 +------------+--------+--------+----+-----------+--------+--------+----+ Mid         65      6       0.91Mid        53      7       0.86 +------------+--------+--------+----+-----------+--------+--------+----+ Lower Pole  43      3       0.93Lower Pole 36      6       0.84 +------------+--------+--------+----+-----------+--------+--------+----+ Hilar       32      5       0.85Hilar      43      4       0.91 +------------+--------+--------+----+-----------+--------+--------+----+ +------------------+---------+------------------+---------+ Right Kidney               Left Kidney                 +------------------+---------+------------------+---------+ RAR                        RAR                         +------------------+---------+------------------+---------+ RAR (manual)      2.5      RAR (manual)      1.88      +------------------+---------+------------------+---------+ Cortex            14/3 cm/sCortex            31/8 cm/s +------------------+---------+------------------+---------+ Cortex thickness           Corex thickness              +------------------+---------+------------------+---------+ Kidney length (cm)12.60    Kidney length (cm)14.20     +------------------+---------+------------------+---------+  Summary: Renal:  Right: 1-59% stenosis of the right renal artery. Abnormal right        Resistive Index. RRV flow present. Left:  1-59% stenosis of the left renal artery. Abnormal left        Resisitve Index. LRV flow present.  *See table(s) above for measurements and observations.     Preliminary    [place] EKG SITE RITE  Result Date: 10/22/2019 If Site Rite image not attached, placement could not be confirmed due to current cardiac rhythm.  US Abdomen Limited RUQ  Result Date: 10/22/2019 CLINICAL DATA:  Acute right upper quadrant abdominal pain. EXAM: ULTRASOUND ABDOMEN LIMITED RIGHT UPPER QUADRANT COMPARISON:  October 19, 2019. FINDINGS: Gallbladder: No gallstones or wall thickening visualized. No sonographic Murphy sign noted by sonographer. Common bile duct: Diameter: 5 mm which is within normal limits. Liver: No focal lesion identified. Within normal limits in parenchymal echogenicity. Portal vein is patent on color Doppler imaging with normal direction of blood flow towards the liver. Other: Minimal ascites is noted. IMPRESSION: Minimal ascites. No other abnormality seen in the right upper quadrant of the abdomen. Electronically Signed   By: Marijo Conception M.D.   On: 10/22/2019 09:03

## 2019-11-05 NOTE — Progress Notes (Signed)
2240 Pt reported shortness of breath and back pain with bedtime medication administration.  Pt sat up on side of bed and took klonopin and then stated she didn't feel good and leaned over toward foot of bed and pt became slow to respond to verbal stimuli.  Pt alert and oriented x 4, MAE, PERRLA. CBG 204.  Lungs to auscultation with very diminished breath sounds t/o right lung fields.  At beginning of shift on assessment pt had crackles bilaterally now improved, no wheezes.  Sats 100% on 2L, pt requested it to be increased to 3L.  RT called for further  assessment and duoneb.  While discussing possible need for ABG with RT, pt looked at nurse and said no.  Pt confirmed she did not want an arterial blood gas drawn. EKG obtained. RRT and [facility] NP notified for further orders.  CXR and labs obtained.  Labs at baseline other than Trop 3521.  Noted pt did have PCI 3/12.  Ordering provider notified of abnormal results.

## 2019-11-05 NOTE — Progress Notes (Signed)
EEG complete - results pending 

## 2019-11-05 NOTE — Progress Notes (Signed)
Inpatient Diabetes Program Recommendations  AACE/ADA: New Consensus Statement on Inpatient Glycemic Control (2015)  Target Ranges:  Prepandial:   less than 140 mg/dL      Peak postprandial:   less than 180 mg/dL (1-2 hours)      Critically ill patients:  140 - 180 mg/dL   Lab Results  Component Value Date   GLUCAP 313 (H) 11/05/2019   HGBA1C 6.9 (H) 10/23/2019    Review of Glycemic Control Results for Jamie [name], Jamie [name] (MRN [id number]) as of 11/05/2019 09:46  Ref. Range 11/04/2019 07:58 11/04/2019 12:02 11/04/2019 16:45 11/04/2019 21:20 11/04/2019 22:48 11/05/2019 07:36 11/05/2019 08:13  Glucose-Capillary Latest Ref Range: 70 - 99 mg/dL 199 (H) 275 (H) 154 (H) 224 (H) 208 (H) 306 (H) 313 (H)   Diabetes history: DM 2 Outpatient Diabetes medications: Basaglar 40 units, Novolog 10 units tid Current orders for Inpatient glycemic control:  Lantus 8 units Daily Novolog 0-9 units tid + hs  Inpatient Diabetes Program Recommendations:    Fasting glucose 300's. Pt had hypoglycemia with 30 units of Lantus.   Consider increasing Lantus to 15 units.  Thanks,  Tama Headings RN, MSN, BC-ADM Inpatient Diabetes Coordinator Team Pager [phone] (8a-5p)

## 2019-11-05 NOTE — Progress Notes (Signed)
Pt had 2 minute episode of unresponsiveness as pt is placed in the commode. CBG =306, 313 on recheck. BP 147/69 O2 saturation at 97 % at 3L per [place]. Dr. Aundra Dubin called back into room to evaluate. Will continue to monitor.

## 2019-11-05 NOTE — Progress Notes (Addendum)
Patient ID: Jamie [name], female   DOB: Dec 25, 1960, 59 y.o.   MRN: [id number]     Advanced Heart Failure Rounding Note  PCP-Cardiologist: No primary care provider on file.   Subjective:    Increased dyspnea over weekend, had IV Lasix with I/Os negative and weight trending down.  Hs-TnI 3521 => 3338 last night.  No chest pain, c/o abdominal fullness.  Breathing is better today.   1 unit PRBCs over weekend, thought to have anemia of chronic disease.   Patient tested positive for COVID-19. Oxygen has been titrated down to 2L Jamie [name].  Now off precautions.  She has had right and left thoracenteses this admission, transudates. CXR 3/14 with bilateral pleural effusions still present.   RHC/LHC + Cardiomems placement. Coronary Findings  Diagnostic Dominance: Right Left Main  Mild 20% distal narrowing.  Left Anterior Descending  30% stenosis proximal LAD prior to D1. 50% stenosis LAD at D1. 80-90% stenosis mid LAD. 75% proximal D1 stenosis, moderate-large vessel.  Left Circumflex  Long 70% stenosis ostial to proximal LCx. Tiny OM1 with diffuse severe disease. Small OM2 with 99% proximal stenosis. 60% stenosis proximal PLOM.  Right Coronary Artery  Long area of calcified plaque from the proximal into the mid RCA. 90% stenosis proximally, 80% stenosis in the mid RCA. 80% stenosis mid PDA. 70% stenosis mid PLV.  Intervention  No interventions have been documented. Right Heart  Right Heart Pressures RHC Procedural Findings: Hemodynamics (mmHg) RA mean 2 RV 29/4 PA 27/10, mean 16 PCWP mean 13 LV 116/8 AO 113/53  Oxygen saturations: PA 69% AO 100%  Cardiac Output (Fick) 6.2  Cardiac Index (Fick) 3.63   PCI (3/12): Atherectomy + DES to prox/mid RCA, DES mid LAD.   Objective:   Weight Range: 65.1 kg Body mass index is 25.42 kg/m.   Vital Signs:   Temp:  [98 F (36.7 C)-99.7 F (37.6 C)] 99.5 F (37.5 C) (03/15 0459) Pulse Rate:  [77-85] 81 (03/15 0459) Resp:  [16-20] 20 (03/14  2313) BP: (143-172)/(58-71) 157/67 (03/15 0459) SpO2:  [100 %] 100 % (03/15 0459) Weight:  [65.1 kg] 65.1 kg (03/15 0459) Last BM Date: 11/02/19  Weight change: Filed Weights   11/03/19 0352 11/04/19 0352 11/05/19 0459  Weight: 68 kg 65.7 kg 65.1 kg    Intake/Output:   Intake/Output Summary (Last 24 hours) at 11/05/2019 0745 Last data filed at 11/05/2019 0214 Gross per 24 hour  Intake 363 ml  Output 2250 ml  Net -1887 ml      Physical Exam    General: NAD Neck: No JVD, no thyromegaly or thyroid nodule.  Lungs: Decreased BS at bases.  CV: Nondisplaced PMI.  Heart regular S1/S2, no S3/S4, no murmur.  No peripheral edema.   Abdomen: Soft, nontender, no hepatosplenomegaly, no distention.  Skin: Intact without lesions or rashes.  Neurologic: Alert and oriented x 3.  Psych: Normal affect. Extremities: No clubbing or cyanosis. S/p right BKA.  HEENT: Normal.    Telemetry   NSR 70s, personally reviewed  Labs    CBC Recent Labs    11/03/19 0440 11/03/19 0440 11/04/19 0030 11/04/19 2310  WBC 8.0  --   --  8.7  NEUTROABS 6.8  --   --   --   HGB 7.2*   < > 7.9* 8.1*  HCT 23.0*   < > 24.1* 24.7*  MCV 81.6  --   --  80.2  PLT 184  --   --  199   < > =  values in this interval not displayed.   Basic Metabolic Panel Recent Labs    11/03/19 0440 11/03/19 0440 11/04/19 0500 11/04/19 2310  NA 129*   < > 134* 131*  K 4.2   < > 4.2 4.3  CL 99   < > 101 96*  CO2 22   < > 24 24  GLUCOSE 371*   < > 203* 222*  BUN 9   < > 12 12  CREATININE 0.97   < > 0.95 1.34*  CALCIUM 7.7*   < > 8.1* 8.4*  MG 1.9  --   --   --    < > = values in this interval not displayed.   Liver Function Tests Recent Labs    11/03/19 0440 11/04/19 2310  AST 52* 27  ALT 24 23  ALKPHOS 59 67  BILITOT <0.1* 0.7  PROT 4.7* 5.5*  ALBUMIN 2.1* 2.4*   No results for input(s): LIPASE, AMYLASE in the last 72 hours. Cardiac Enzymes No results for input(s): CKTOTAL, CKMB, CKMBINDEX, TROPONINI in  the last 72 hours.  BNP: BNP (last 3 results) Recent Labs    11/01/19 0416 11/02/19 0415 11/03/19 0440  BNP 179.2* 173.0* 472.3*    ProBNP (last 3 results) No results for input(s): PROBNP in the last 8760 hours.   D-Dimer No results for input(s): DDIMER in the last 72 hours. Hemoglobin A1C No results for input(s): HGBA1C in the last 72 hours. Fasting Lipid Panel No results for input(s): CHOL, HDL, LDLCALC, TRIG, CHOLHDL, LDLDIRECT in the last 72 hours. Thyroid Function Tests No results for input(s): TSH, T4TOTAL, T3FREE, THYROIDAB in the last 72 hours.  Invalid input(s): FREET3  Other results:   Imaging    DG Chest 2 View  Result Date: 11/04/2019 CLINICAL DATA:  Shortness of breath. CHF. EXAM: CHEST - 2 VIEW COMPARISON:  10/31/2019 and prior radiographs FINDINGS: The cardiomediastinal silhouette is unremarkable. A RIGHT PICC line is present with tip overlying the LOWER SVC. Pulmonary artery monitor/sensor overlying the RIGHT interlobar pulmonary artery is now noted. Bilateral pleural effusions and LEFT LOWER lung atelectasis/consolidation again noted. No pneumothorax or new pulmonary opacities. IMPRESSION: 1. No significant change of bilateral pleural effusions and LEFT LOWER lung consolidation/atelectasis. 2. Pulmonary artery monitor/sensor overlying the RIGHT interlobar pulmonary artery. Electronically Signed   By: Margarette Canada M.D.   On: 11/04/2019 15:17   DG CHEST PORT 1 VIEW  Result Date: 11/04/2019 CLINICAL DATA:  Shortness of breath. EXAM: PORTABLE CHEST 1 VIEW COMPARISON:  Radiograph earlier this day. CT 10/01/2019 FINDINGS: Right upper extremity PICC tip in the SVC. Pulmonary artery monitoring sensor projects over the right inter lobar pulmonary artery, unchanged. Hazy left lung base opacity consistent with pleural effusion and atelectasis, similar to prior. Slight increase in hazy right lung base opacity from prior. Unchanged heart size and mediastinal contours. No  pneumothorax. IMPRESSION: 1. Slight increase in hazy right lung base opacity, likely increased pleural effusion. 2. Unchanged left pleural effusion and basilar opacity. Electronically Signed   By: Keith Rake M.D.   On: 11/04/2019 23:41     Medications:     Scheduled Medications: . sodium chloride   Intravenous Once  . amLODipine  10 mg Oral Daily  . aspirin EC  81 mg Oral Daily  . atorvastatin  80 mg Oral QHS  . busPIRone  5 mg Oral Daily  . Chlorhexidine Gluconate Cloth  6 each Topical Daily  . clonazepam  0.25 mg Oral BID  . cloNIDine  0.3 mg Oral BID  . clopidogrel  75 mg Oral Daily  . FLUoxetine  40 mg Oral Daily  . folic acid  1 mg Oral Daily  . furosemide  40 mg Intravenous BID  . heparin  5,000 Units Subcutaneous Q8H  . hydrALAZINE  100 mg Oral Q8H  . insulin aspart  0-5 Units Subcutaneous QHS  . insulin aspart  0-9 Units Subcutaneous TID WC  . insulin glargine  8 Units Subcutaneous Daily  . isosorbide mononitrate  60 mg Oral Daily  . labetalol  400 mg Oral BID  . multivitamin with minerals  1 tablet Oral Daily  . pantoprazole  40 mg Oral Daily  . rosuvastatin  20 mg Oral q1800  . sodium chloride flush  3 mL Intravenous Q12H  . sodium chloride flush  3 mL Intravenous Q12H  . spironolactone  25 mg Oral Daily  . thiamine  100 mg Oral Daily    Infusions: . sodium chloride      PRN Medications: sodium chloride, acetaminophen, albuterol, ALPRAZolam, hydrALAZINE, HYDROcodone-acetaminophen, ipratropium-albuterol, morphine injection, nitroGLYCERIN, ondansetron (ZOFRAN) IV, sodium chloride flush   Assessment/Plan   1. Acute hypoxemic respiratory failure: COVID-19 viral pneumonia + CHF exacerbation.  Weight is down with diuresis, still on 2L [place]. Increased dyspnea over weekend, got IV Lasix.  CXR with bilateral pleural effusion still. - Completed steroids and remdesivir for COVID-19.  - Follow CVP off PICC, will continue IV Lasix today unless CVP low.  2. Acute on  chronic diastolic CHF: Multiple admissions over the last 6 months with hypertensive urgency/diastolic CHF.  Echo at [facility] 2/21 with EF 55-60%, restrictive diastolic function.  Echo here with EF 55-60%, normal RV, IVC suggesting RA pressure 8 mmHg.  RHC 3/10 showed optimized filling pressures.  With multiple recent diastolic CHF admissions, we placed a Cardiomems device. More short of breath over weekend, got IV Lasix.  Does not appear to have marked JVD but still with dyspnea, ?due to recurrent pleural effusions.  - Will check CVP off PICC and continue IV Lasix for now.  - Will need Cardiomems teaching today.  3. Pleural effusions: Bilateral.  Had thoracenteses at [facility] and again bilaterally here. Transudates with negative cytology. Suspect due to CHF.   - CXR yesterday with pleural effusions still present, ?needs drainage again.  May need to re-involve pulmonary.  4. HTN: Poorly controlled at home, admitted with hypertensive emergency/CHF.  HTN slowly getting under better control but still high today.  - Continue amlodipine 10 mg daily.  - Continue hydralazine 100 mg tid. - Increase labetalol to 400 mg bid.  - Continue clonidine 0.3 bid.   - Continue spironolactone 25 mg daily.  - Hold off on ACEI/ARB with AKI.  - Check renal artery dopplers to rule out renal artery stenosis, still not done.  5. AKI: Creatinine to 2.4 with diuresis, now 1.34 but a little higher today.  Follow closely with diuresis.   6. Abdominal pain: CT abdomen earlier in admission with 1.5 cm liver lesion but no other significant abdominal abnormality.  She has had abdominal pain throughout this admission.  CT abdomen 3/1 showed no acute abdominal findings.  7. COPD: Per report on 3L home oxygen, now down to 2L and probably could continue to lower.  8. DM: SSI, per [facility].  9. CAD: Coronary angiography today showed severe, calcified 3 vessel disease.  This likely plays a role in her multiple episodes of flash pulmonary  edema.  Ideally, based on this anatomy she  would have CABG.  However, she has had right BKA and does not have a prosthesis.  She is currently non-ambulatory.  She would not be able to rehab effectively after CABG.  Now s/p atherectomy/PCI to prox/mid LAD and PCI mid LAD. For now, no intervention on LCx disease. HsTnI checked last night and elevated but repeat trended down.  ?if this was post-PCI.  No chest pain. ECG yesterday with no change.  - Repeat ECG today.  - Continue Crestor, ASA, Plavix.   Length of Stay: [place], MD  11/05/2019, 7:45 AM  Advanced Heart Failure Team Pager [phone] (M-F; [place])  Please contact [facility] for night-coverage after hours (4p -7a ) and weekends on [email or web address]  Called by nurse to see patient as she was unresponsive when they got her up to commode chair.  When I saw her, she was lying in bed again and was slowly responsive.  Able to answer questions.  Says she feels "weak."  BP 147/69.   - ?Orthostatic getting up to chair, will check orthostatics. Hold BP meds for now.  - Will order stat head CT and may need neurology consultation.   Loralie Champagne 11/05/2019 8:15 AM

## 2019-11-05 NOTE — Procedures (Signed)
Patient Name: Jamie [name]  MRN: [id number]  Epilepsy Attending: Braeley Havens  Referring Physician/Provider: Dr Dana Allan Date: 11/05/2019 Duration: 23.33 mins  Patient history: [name] admitted due to [place] had an episode of transient alteration of awareness. EEG to evaluate for seizure.  Level of alertness: awake, asleep  AEDs during EEG study: Clonazepam  Technical aspects: This EEG study was done with scalp electrodes positioned according to the 10-20 International system of electrode placement. Electrical activity was acquired at a sampling rate of 500Hz  and reviewed with a high frequency filter of 70Hz  and a low frequency filter of 1Hz . EEG data were recorded continuously and digitally stored.   DESCRIPTION:  The posterior dominant rhythm consists of 9-10 Hz activity of moderate voltage (25-35 uV) seen predominantly in posterior head regions, symmetric and reactive to eye opening and eye closing. Sleep was characterized by vertex waves, maximal frontocentral. Hyperventilation and photic stimulation were not performed.  IMPRESSION: This study is within normal limits. No seizures or epileptiform discharges were seen throughout the recording.  Oluwatoyin Banales Barbra Sarks

## 2019-11-06 DIAGNOSIS — N179 Acute kidney failure, unspecified: Secondary | ICD-10-CM

## 2019-11-06 DIAGNOSIS — Z515 Encounter for palliative care: Secondary | ICD-10-CM

## 2019-11-06 DIAGNOSIS — Z7189 Other specified counseling: Secondary | ICD-10-CM

## 2019-11-06 LAB — GLUCOSE, CAPILLARY
Glucose-Capillary: 291 mg/dL — ABNORMAL HIGH (ref 70–99)
Glucose-Capillary: 275 mg/dL — ABNORMAL HIGH (ref 70–99)
Glucose-Capillary: 362 mg/dL — ABNORMAL HIGH (ref 70–99)
Glucose-Capillary: 363 mg/dL — ABNORMAL HIGH (ref 70–99)
Glucose-Capillary: 283 mg/dL — ABNORMAL HIGH (ref 70–99)

## 2019-11-06 MED ORDER — CLONIDINE HCL 0.3 MG PO TABS: 0.3000 mg | ORAL_TABLET | Freq: Two times a day (BID) | ORAL | 0 refills | Status: AC

## 2019-11-06 MED ORDER — ALPRAZOLAM 0.25 MG PO TABS: 0.2500 mg | ORAL_TABLET | Freq: Two times a day (BID) | ORAL | 0 refills | Status: AC | PRN

## 2019-11-06 MED ORDER — ISOSORBIDE MONONITRATE ER 60 MG PO TB24: 60.0000 mg | ORAL_TABLET | Freq: Every day | ORAL | 0 refills | Status: DC

## 2019-11-06 MED ORDER — CLONAZEPAM 0.25 MG PO TBDP: 0.2500 mg | ORAL_TABLET | Freq: Two times a day (BID) | ORAL | 0 refills | Status: AC

## 2019-11-06 MED ORDER — INSULIN ASPART 100 UNIT/ML ~~LOC~~ SOLN: 0.0000 [IU] | Freq: Three times a day (TID) | SUBCUTANEOUS | 11 refills | Status: AC

## 2019-11-06 MED ORDER — FOLIC ACID 1 MG PO TABS: 1.0000 mg | ORAL_TABLET | Freq: Every day | ORAL | 0 refills | Status: DC

## 2019-11-06 MED ORDER — INSULIN GLARGINE 100 UNIT/ML ~~LOC~~ SOLN: 20.0000 [IU] | Freq: Every day | SUBCUTANEOUS | 11 refills | Status: DC

## 2019-11-06 MED ORDER — IPRATROPIUM-ALBUTEROL 0.5-2.5 (3) MG/3ML IN SOLN: 3.0000 mL | RESPIRATORY_TRACT | 0 refills | Status: DC | PRN

## 2019-11-06 MED ORDER — ASPIRIN 81 MG PO TBEC: 81.0000 mg | DELAYED_RELEASE_TABLET | Freq: Every day | ORAL | 0 refills | Status: AC

## 2019-11-06 MED ORDER — THIAMINE HCL 100 MG PO TABS: 100.0000 mg | ORAL_TABLET | Freq: Every day | ORAL | 0 refills | Status: DC

## 2019-11-06 MED ORDER — ADULT MULTIVITAMIN W/MINERALS CH: 1.0000 | ORAL_TABLET | Freq: Every day | ORAL | 0 refills | Status: AC

## 2019-11-06 MED ORDER — AMLODIPINE BESYLATE 10 MG PO TABS: 10.0000 mg | ORAL_TABLET | Freq: Every day | ORAL | 0 refills | Status: AC

## 2019-11-06 NOTE — Progress Notes (Signed)
MD on call notified about CBG at 1645, no new orders given. Will continue to monitor.   Pt CBG 363 after 9 units of insulin for a CBG of 362 at 1519 . Pt did not want to take 1230 dose of insulin because she was "talking with palliative" She #[phone] on the list for [facility] to transport her to her facility. She will be here for a while. She will not have insulin again until 2200. Do you want to order another dose?

## 2019-11-06 NOTE — Consult Note (Signed)
Consultation Note Date: 11/06/2019   Patient Name: Jamie [name]  DOB: 1960/12/27  MRN: [id number]  Age / Sex: 59 y.o., female  PCP: Patient, No Pcp Per Referring Physician: Bonnell Public, MD  Reason for Consultation: Establishing goals of care and Psychosocial/spiritual support  HPI/Patient Profile: 59 y.o. 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    Prior to this, patient was recently admitted and discharged from [facility] many times 01/30-2/2 and 2/8-2/13 due to acute on chronic respiratory failure secondary to bilateral pleural effusion from acute exacerbation of diastolic CHF and had thoracentesis done on 02/11.   Initial workup showed HTN emergency with worsening of baseline hypoxia, COVID was negative .  CT abdomen and pelvis with contrast showed large bilateral pleural effusions with adjacent subsegmental atelectasis.  Patient was given breathing treatment, IV Lasix IV and aggressive BP management. However, her creatinine gradually trending up and diuretics was held briefly.She underwent thoracentesis on the left on 2/23 with 1500 mL out and on the right on 02/26 with 1 L out, fluid was transudative, negative for malignancy. Patient was seen by Cardiology, who recommended patient transferred to [facility] for putative right sided cardio cath.  Symptoms started improved after resuming diuretics and dose increased on 2/28.  Workup in [place] also found left PNA for which pt has been on ABX and liver mass needs MRI with contrast which likely will be outpatient given her  worsening kidney function.   Patient faces treatment option decisions, advanced directive decisions and anticipatory care needs.  Clinical Assessment and Goals of Care:   This NP Wadie Lessen reviewed medical records, received report from team, assessed the patient and then meet at the patient's bedside  to discuss diagnosis, prognosis, GOC, EOL wishes disposition and options.  Concept of Hospice and Palliative Care were discussed  Created space and opportunity for patient to explore her thoughts and feelings regarding her current medical situation.  She is visibly agitated and frustrated as she voices her feelings that "nobody cares about her", "nobody is willing to help her even a little bit", "I really do not want to go to a facility where they only provide to 20 minutes of therapy a day".   "I really do not feel like doing all of this"   We talked about advanced care planning specific to her decision for an aggressive medical intervention path versus a comfort path.  I shared with her that she does have the right to make her own decisions regarding her desire for aggressive medical interventions, rehospitalization's, and resuscitative attempts.  Patient quickly changes the path of the conversation when it gets specific.   A discussion was had today regarding advanced directives.  Concepts specific to code status, artifical  feeding and hydration, continued IV antibiotics and rehospitalization was had.    Values and goals of care important to patient and family were attempted to be elicited.  Patient is high risk for decompensation and rehospitalization.   Questions and concerns addressed.  PMT will continue to support holistically.  No documented healthcare power of attorney or advanced directive noted   NEXT OF KIN    SUMMARY OF RECOMMENDATIONS    Code Status/Advance Care Planning:  Full code   Encourage patient to consider DNR/DNI knowing evidence-based poor outcomes in  similar patients   Additional Recommendations (Limitations, Scope, Preferences):  Full Scope Treatment  Psycho-social/Spiritual:   Desire for further Chaplaincy support:no  Prognosis:   Unable to determine  Discharge Planning: Today is day 16 of patient's hospitalization.  It seems that she is likely going to be discharged today.  I recommended to the transition team that patient have outpatient community-based palliative services at facility.     Primary Diagnoses: Present on Admission: . Chronic diastolic HF (heart failure) ([place]) . HTN (hypertension) . PAD (peripheral artery disease) ([place])   I have reviewed the medical record, interviewed the patient and family, and examined the patient. The following aspects are pertinent.  Past Medical History:  Diagnosis Date  . Amputation below knee ([place]) RT   . Chronic diastolic HF (heart failure) ([place]) 10/21/2019  . COPD (chronic obstructive pulmonary disease) ([place])   . Diabetes type 2, uncontrolled ([place])   . Gastritis and duodenitis   . HTN (hypertension) 10/21/2019  . PAD (peripheral artery disease) ([place]) 10/21/2019  . PAD (peripheral artery disease) ([place])   . PNA (pneumonia)   . TIA (transient ischemic attack)    Social History   Socioeconomic History  . Marital status: Unknown    Spouse name: Not on file  . Number of children: Not on file  . Years of education: Not on file  . Highest education level: Not on file  Occupational History  . Not on file  Tobacco Use  . Smoking status: Never Smoker  . Smokeless tobacco: Never Used  Substance and Sexual Activity  . Alcohol use: Never  . Drug use: Never  . Sexual activity: Not on file  Other Topics Concern  . Not on file  Social History Narrative  . Not on file   Social Determinants of Health   Financial Resource Strain:   . Difficulty of Paying Living Expenses:   Food Insecurity:   . Worried About Charity fundraiser in the Last Year:   . Arboriculturist in the  Last Year:   Transportation Needs:   . Film/video editor (Medical):   Marland Kitchen Lack of Transportation (Non-Medical):   Physical Activity:   . Days of Exercise per Week:   . Minutes of Exercise per Session:   Stress:   . Feeling of Stress :   Social Connections:   . Frequency of Communication with Friends and Family:   . Frequency of Social Gatherings with Friends and Family:   . Attends Religious Services:   . Active Member of Clubs or Organizations:   . Attends Archivist Meetings:   Marland Kitchen Marital Status:    Family History  Problem Relation Age of Onset  . Hypertension Brother   . Diabetes Brother   . Hypertension Maternal Grandmother   . Heart attack Paternal Grandmother    Scheduled Meds: . sodium chloride   Intravenous Once  . amLODipine  10 mg Oral Daily  .  aspirin EC  81 mg Oral Daily  . atorvastatin  80 mg Oral QHS  . busPIRone  5 mg Oral Daily  . Chlorhexidine Gluconate Cloth  6 each Topical Daily  . clonazepam  0.25 mg Oral BID  . cloNIDine  0.3 mg Oral BID  . clopidogrel  75 mg Oral Daily  . FLUoxetine  40 mg Oral Daily  . folic acid  1 mg Oral Daily  . heparin  5,000 Units Subcutaneous Q8H  . hydrALAZINE  100 mg Oral Q8H  . insulin aspart  0-5 Units Subcutaneous QHS  . insulin aspart  0-9 Units Subcutaneous TID WC  . insulin glargine  20 Units Subcutaneous Daily  . isosorbide mononitrate  60 mg Oral Daily  . labetalol  300 mg Oral BID  . multivitamin with minerals  1 tablet Oral Daily  . pantoprazole  40 mg Oral Daily  . sodium chloride flush  3 mL Intravenous Q12H  . sodium chloride flush  3 mL Intravenous Q12H  . thiamine  100 mg Oral Daily   Continuous Infusions: . sodium chloride     PRN Meds:.sodium chloride, acetaminophen, albuterol, ALPRAZolam, hydrALAZINE, HYDROcodone-acetaminophen, ipratropium-albuterol, morphine injection, nitroGLYCERIN, ondansetron (ZOFRAN) IV, sodium chloride flush Medications Prior to Admission:  Prior to Admission  medications   Medication Sig Start Date End Date Taking? Authorizing Provider  acetaminophen (TYLENOL) 325 MG tablet Take 650 mg by mouth every 6 (six) hours as needed for headache (pain).   Yes [provider]  albuterol (VENTOLIN HFA) 108 (90 Base) MCG/ACT inhaler Inhale 2 puffs into the lungs every 4 (four) hours as needed for wheezing or shortness of breath.   Yes [provider]  ALPRAZolam Duanne Moron) 0.5 MG tablet Take 0.5 mg by mouth every 6 (six) hours as needed for anxiety.   Yes [provider]  amLODipine (NORVASC) 5 MG tablet Take 5 mg by mouth daily.   Yes [provider]  atorvastatin (LIPITOR) 80 MG tablet Take 80 mg by mouth at bedtime.   Yes [provider]  buPROPion (WELLBUTRIN) 100 MG tablet Take 100 mg by mouth 2 (two) times daily.   Yes [provider]  busPIRone (BUSPAR) 5 MG tablet Take 5 mg by mouth every morning.   Yes [provider]  Calcium Carbonate (CALCIUM-CARB 600 PO) Take 600 mg by mouth every 12 (twelve) hours as needed (acid indigestion).   Yes [provider]  Cholecalciferol (VITAMIN D3) 1.25 MG (50000 UT) TABS Take 50,000 Units by mouth every Tuesday.    Yes [provider]  clopidogrel (PLAVIX) 75 MG tablet Take 75 mg by mouth daily.   Yes [provider]  FLUoxetine (PROZAC) 40 MG capsule Take 40 mg by mouth every morning.   Yes [provider]  furosemide (LASIX) 20 MG tablet Take 60 mg by mouth 2 (two) times daily.   Yes [provider]  hydrALAZINE (APRESOLINE) 100 MG tablet Take 100 mg by mouth every 8 (eight) hours.   Yes [provider]  insulin aspart (NOVOLOG) 100 UNIT/ML injection Inject 10 Units into the skin 3 (three) times daily before meals.   Yes [provider]  Insulin Glargine (BASAGLAR KWIKPEN) 100 UNIT/ML SOPN Inject 40 Units into the skin at bedtime.   Yes [provider]  ipratropium-albuterol (DUONEB)  0.5-2.5 (3) MG/3ML SOLN Take 3 mLs by nebulization 4 (four) times daily.   Yes [provider]  isosorbide mononitrate (IMDUR) 120 MG 24 hr tablet Take 120  mg by mouth at bedtime.   Yes [provider]  labetalol (NORMODYNE) 300 MG tablet Take 300 mg by mouth every 12 (twelve) hours.   Yes [provider]  lisinopril (ZESTRIL) 20 MG tablet Take 20 mg by mouth 2 (two) times daily.   Yes [provider]  loperamide (IMODIUM) 2 MG capsule Take 2 mg by mouth every 6 (six) hours as needed for diarrhea or loose stools.   Yes [provider]  Melatonin 5 MG TABS Take 5 mg by mouth at bedtime.   Yes [provider]  nitroGLYCERIN (NITROSTAT) 0.4 MG SL tablet Place 0.4 mg under the tongue every 5 (five) minutes as needed for chest pain.   Yes [provider]  ondansetron (ZOFRAN) 8 MG tablet Take 8 mg by mouth every 12 (twelve) hours as needed for nausea or vomiting.   Yes [provider]  pantoprazole (PROTONIX) 40 MG tablet Take 40 mg by mouth daily.   Yes [provider]  polyethylene glycol (MIRALAX / GLYCOLAX) 17 g packet Take 17 g by mouth daily as needed (constipation).   Yes [provider]  potassium chloride SA (KLOR-CON) 20 MEQ tablet Take 20 mEq by mouth daily.   Yes [provider]  spironolactone (ALDACTONE) 25 MG tablet Take 12.5 mg by mouth every morning.   Yes [provider]  ALPRAZolam (XANAX) 0.25 MG tablet Take 1 tablet (0.25 mg total) by mouth 2 (two) times daily as needed for anxiety. 11/06/19 12/16/19  Dana Allan I, MD  amLODipine (NORVASC) 10 MG tablet Take 1 tablet (10 mg total) by mouth daily. 11/07/19   Dana Allan I, MD  aspirin EC 81 MG EC tablet Take 1 tablet (81 mg total) by mouth daily. 11/07/19   Bonnell Public, MD  clonazePAM (KLONOPIN) 0.25 MG disintegrating tablet Take 1 tablet (0.25 mg total) by mouth 2 (two) times daily. 11/06/19   Dana Allan I,  MD  cloNIDine (CATAPRES) 0.3 MG tablet Take 1 tablet (0.3 mg total) by mouth 2 (two) times daily. 11/06/19   Bonnell Public, MD  folic acid (FOLVITE) 1 MG tablet Take 1 tablet (1 mg total) by mouth daily. 11/07/19   Bonnell Public, MD  insulin aspart (NOVOLOG) 100 UNIT/ML injection Inject 0-9 Units into the skin 3 (three) times daily with meals. 11/06/19   Dana Allan I, MD  insulin glargine (LANTUS) 100 UNIT/ML injection Inject 0.2 mLs (20 Units total) into the skin daily. 11/07/19   Dana Allan I, MD  ipratropium-albuterol (DUONEB) 0.5-2.5 (3) MG/3ML SOLN Take 3 mLs by nebulization every 4 (four) hours as needed. 11/06/19   Bonnell Public, MD  isosorbide mononitrate (IMDUR) 60 MG 24 hr tablet Take 1 tablet (60 mg total) by mouth daily. 11/07/19   Bonnell Public, MD  Multiple Vitamin (MULTIVITAMIN WITH MINERALS) TABS tablet Take 1 tablet by mouth daily. 11/07/19   Dana Allan I, MD  thiamine 100 MG tablet Take 1 tablet (100 mg total) by mouth daily. 11/07/19   Bonnell Public, MD   Allergies  Allergen Reactions  . Codeine Other (See Comments)    headaches  . Meperidine And Related Other (See Comments)    headaches  . Tetracyclines & Related Other (See Comments)    Caused yeast infections   Review of Systems  Constitutional: Positive for activity change and fatigue.  Neurological: Positive for weakness.  Psychiatric/Behavioral: Positive for agitation.    Physical Exam Cardiovascular:  Rate and Rhythm: Normal rate.  Musculoskeletal:     Comments: Generalized weakness and muscle atrophy  Skin:    General: Skin is warm and dry.  Neurological:     Mental Status: She is alert.  Psychiatric:        Mood and Affect: Mood is anxious. Affect is angry.        Behavior: Behavior is agitated.     Vital Signs: BP (!) 146/63   Pulse 78   Temp 98.3 F (36.8 C) (Oral)   Resp 11   Ht 5\' 3"  (1.6 m)   Wt 65 kg   SpO2 100%   BMI 25.38 kg/m  Pain  Scale: 0-10 POSS *See Group Information*: 1-Acceptable,Awake and alert Pain Score: 0-No pain   SpO2: SpO2: 100 % O2 Device:SpO2: 100 % O2 Flow Rate: .O2 Flow Rate (L/min): 4 L/min  IO: Intake/output summary:   Intake/Output Summary (Last 24 hours) at 11/06/2019 1448 Last data filed at 11/06/2019 1300 Gross per 24 hour  Intake 683 ml  Output 900 ml  Net -217 ml    LBM: Last BM Date: 11/05/19 Baseline Weight: Weight: 70.1 kg Most recent weight: Weight: 65 kg     Palliative Assessment/Data:   Discussed with Dr. Marthenia Rolling and Dr. Aundra Dubin and LCSW  Time In: 1200 Time Out: 1310 Time Total: 70 minutes Greater than 50%  of this time was spent counseling and coordinating care related to the above assessment and plan.  Signed by: Wadie Lessen, NP   Please contact Palliative Medicine Team phone at [phone] for questions and concerns.  For individual provider: See Shea Evans

## 2019-11-06 NOTE — TOC Transition Note (Signed)
Transition of Care [facility]) - CM/SW Discharge Note   Patient Details  Name: Jamie [name] MRN: [id number] Date of Birth: 1961-04-08  Transition of Care [facility]) CM/SW Contact:  Gabrielle Dare Phone Number: 11/06/2019, 3:40 PM   Clinical Narrative:     Patient will Discharge To: [facility] Date:11/06/19 Family Notified: Velta Addison Deanette Tullius, daughter in law, [phone] Transport By: Corey Harold   Per MD patient ready for DC to [facility], patient, patient's family, and facility notified of DC. Assessment, Fl2/Pasrr, and Discharge Summary sent to facility. RN given number for report [phone], Room # 724). DC packet on chart. Ambulance transport requested for patient for 4:30pm  CSW signing off.  Reed Breech LCSWA [phone]    Final next level of care: Skilled Nursing Facility Barriers to Discharge: No Barriers Identified   Patient Goals and CMS Choice Patient states their goals for this hospitalization and ongoing recovery are:: to get stronger CMS Medicare.gov Compare Post Acute Care list provided to:: Patient Choice offered to / list presented to : Patient  Discharge Placement              Patient chooses bed at: [facility]) Patient to be transferred to facility by: [place] Name of family member notified: Voncille Simm Patient and family notified of of transfer: 11/06/19  Discharge Plan and Services                                     Social Determinants of Health (SDOH) Interventions     Readmission Risk Interventions No flowsheet data found.

## 2019-11-06 NOTE — Progress Notes (Signed)
Spoke with pt regarding stents, Plavix, restrictions, HF, fluid management, and increasing activity. She is frustrated with everything. Voices that SNF does not help her with activity/rehab. Sts they do not give her low sodium foods or help her weigh. Will refer to [facility] however pt n/a right now. I encouraged her to get stronger with prosthesis at rehab. [place], ACSM 3:15 PM 11/06/2019

## 2019-11-06 NOTE — NC FL2 (Signed)
[place] MEDICAID FL2 LEVEL OF CARE SCREENING TOOL     IDENTIFICATION  Patient Name: Jamie [name] Birthdate: 1961/04/16 Sex: female Admission Date (Current Location): 10/21/2019  [facility] and [place] Number:  Herbalist and Address:  [facility], [place] [address], [place], [place] [address]      Provider Number: [id number]  Attending Physician Name and Address:  Bonnell Public, MD  Relative Name and Phone Number:  Estill Bamberg [phone] Daughter n Law    Current Level of Care: Hospital Recommended Level of Care: [place] Prior Approval Number:    Date Approved/Denied: 09/07/19 PASRR Number: [id number]  Discharge Plan: SNF    Current Diagnoses: Patient Active Problem List   Diagnosis Date Noted  . Acute respiratory insufficiency   . Chronic diastolic HF (heart failure) ([place]) 10/21/2019  . HTN (hypertension) 10/21/2019  . PAD (peripheral artery disease) ([place]) 10/21/2019  . CHF (congestive heart failure) ([place]) 10/21/2019  . Acute on chronic diastolic CHF (congestive heart failure) ([facility])     Orientation RESPIRATION BLADDER Height & Weight     Self, Time, Situation, Place  O2(Nasal Canula 4 liters) Continent Weight: 143 lb 4.8 oz (65 kg) Height:  5\' 3"  (160 cm)  BEHAVIORAL SYMPTOMS/MOOD NEUROLOGICAL BOWEL NUTRITION STATUS  Other (Comment)(no behavioral symptoms/mood)   Continent Diet(See Discharge Summary)  AMBULATORY STATUS COMMUNICATION OF NEEDS Skin   Limited Assist Verbally Other (Comment)(MASD abrasion on Right, Left and upper back,Eccymosis on Abdomen and Arm bilateral,moisture on groin mid circumferential)                       Personal Care Assistance Level of Assistance  Bathing, Feeding, Dressing Bathing Assistance: Limited assistance Feeding assistance: Independent(able to feed self) Dressing Assistance: Limited assistance     Functional Limitations Info  Sight, Hearing, Speech Sight Info:  Impaired Hearing Info: Adequate Speech Info: Adequate    SPECIAL CARE FACTORS FREQUENCY  PT (By licensed PT), OT (By licensed OT)     PT Frequency: 5 x min weekly OT Frequency: 5x min weekly            Contractures Contractures Info: Not present    Additional Factors Info  Code Status, Allergies Code Status Info: FULL Allergies Info: Codeine,Meperidine And Related,Tetracyclines & Related Psychotropic Info: clonazepam Insulin Sliding Scale Info: Inject 0-9 Units into the skin 3 (three) times daily with meals. Isolation Precautions Info: Positive COVID     Current Medications (11/06/2019):  This is the current hospital active medication list Current Facility-Administered Medications  Medication Dose Route Frequency Provider Last Rate Last Admin  . 0.9 %  sodium chloride infusion (Manually program via Guardrails IV Fluids)   Intravenous Once Elgergawy, Dawood S, MD      . 0.9 %  sodium chloride infusion  250 mL Intravenous PRN Martinique, Peter M, MD      . acetaminophen (TYLENOL) tablet 650 mg  650 mg Oral Q4H PRN Martinique, Peter M, MD   650 mg at 11/04/19 2350  . albuterol (VENTOLIN HFA) 108 (90 Base) MCG/ACT inhaler 1-2 puff  1-2 puff Inhalation Q4H PRN Martinique, Peter M, MD   2 puff at 10/22/19 0314  . ALPRAZolam Duanne Moron) tablet 0.25 mg  0.25 mg Oral BID PRN Martinique, Peter M, MD   0.25 mg at 11/05/19 2151  . amLODipine (NORVASC) tablet 10 mg  10 mg Oral Daily Martinique, Peter M, MD   10 mg at 11/06/19 0901  . aspirin  EC tablet 81 mg  81 mg Oral Daily Martinique, Peter M, MD   81 mg at 11/06/19 9892  . atorvastatin (LIPITOR) tablet 80 mg  80 mg Oral QHS Martinique, Peter M, MD   80 mg at 11/05/19 2143  . busPIRone (BUSPAR) tablet 5 mg  5 mg Oral Daily Martinique, Peter M, MD   5 mg at 11/06/19 1194  . Chlorhexidine Gluconate Cloth 2 % PADS 6 each  6 each Topical Daily Martinique, Peter M, MD   6 each at 11/06/19 [phone]  . clonazepam (KLONOPIN) disintegrating tablet 0.25 mg  0.25 mg Oral BID Martinique, Peter M,  MD   0.25 mg at 11/06/19 0859  . cloNIDine (CATAPRES) tablet 0.3 mg  0.3 mg Oral BID Martinique, Peter M, MD   0.3 mg at 11/06/19 0901  . clopidogrel (PLAVIX) tablet 75 mg  75 mg Oral Daily Martinique, Peter M, MD   75 mg at 11/06/19 0902  . FLUoxetine (PROZAC) capsule 40 mg  40 mg Oral Daily Martinique, Peter M, MD   40 mg at 11/06/19 0901  . folic acid (FOLVITE) tablet 1 mg  1 mg Oral Daily Martinique, Peter M, MD   1 mg at 11/06/19 8144  . heparin injection 5,000 Units  5,000 Units Subcutaneous Q8H Martinique, Peter M, MD   5,000 Units at 11/06/19 0602  . hydrALAZINE (APRESOLINE) injection 10 mg  10 mg Intravenous Q6H PRN Martinique, Peter M, MD   10 mg at 10/22/19 8185  . hydrALAZINE (APRESOLINE) tablet 100 mg  100 mg Oral Q8H Martinique, Peter M, MD   100 mg at 11/06/19 1519  . HYDROcodone-acetaminophen (NORCO/VICODIN) 5-325 MG per tablet 1 tablet  1 tablet Oral Q6H PRN Martinique, Peter M, MD   1 tablet at 11/05/19 0246  . insulin aspart (novoLOG) injection 0-5 Units  0-5 Units Subcutaneous QHS Elgergawy, Silver Huguenin, MD   3 Units at 11/05/19 2143  . insulin aspart (novoLOG) injection 0-9 Units  0-9 Units Subcutaneous TID WC Elgergawy, Silver Huguenin, MD   9 Units at 11/06/19 1519  . insulin glargine (LANTUS) injection 20 Units  20 Units Subcutaneous Daily Dana Allan I, MD   20 Units at 11/06/19 [phone]  . ipratropium-albuterol (DUONEB) 0.5-2.5 (3) MG/3ML nebulizer solution 3 mL  3 mL Nebulization Q4H PRN Martinique, Peter M, MD   3 mL at 11/04/19 2255  . isosorbide mononitrate (IMDUR) 24 hr tablet 60 mg  60 mg Oral Daily Martinique, Peter M, MD   60 mg at 11/06/19 0900  . labetalol (NORMODYNE) tablet 300 mg  300 mg Oral BID Larey Dresser, MD   300 mg at 11/06/19 0900  . morphine 2 MG/ML injection 0.5 mg  0.5 mg Intravenous Q8H PRN Martinique, Peter M, MD      . multivitamin with minerals tablet 1 tablet  1 tablet Oral Daily Martinique, Peter M, MD   1 tablet at 11/06/19 0859  . nitroGLYCERIN (NITROSTAT) SL tablet 0.4 mg  0.4 mg Sublingual Q5  min PRN Martinique, Peter M, MD   0.4 mg at 11/01/19 1235  . ondansetron (ZOFRAN) injection 4 mg  4 mg Intravenous Q6H PRN [place]ique, Peter M, MD   4 mg at 11/02/19 2346  . pantoprazole (PROTONIX) EC tablet 40 mg  40 mg Oral Daily Martinique, Peter M, MD   40 mg at 11/06/19 0901  . sodium chloride flush (NS) 0.9 % injection 3 mL  3 mL Intravenous Q12H Martinique, Peter M, MD   10  mL at 11/04/19 2236  . sodium chloride flush (NS) 0.9 % injection 3 mL  3 mL Intravenous Q12H Martinique, Peter M, MD   3 mL at 11/05/19 2144  . sodium chloride flush (NS) 0.9 % injection 3 mL  3 mL Intravenous PRN [place]ique, Peter M, MD      . thiamine tablet 100 mg  100 mg Oral Daily Martinique, Peter M, MD   100 mg at 11/06/19 4193     Discharge Medications: Please see discharge summary for a list of discharge medications.  Relevant Imaging Results:  Relevant Lab Results:   Additional Information [id number]  Trula Ore, LCSWA

## 2019-11-06 NOTE — Progress Notes (Addendum)
Patient ID: Jamie [name], female   DOB: 20-Feb-1961, 59 y.o.   MRN: [id number]     Advanced Heart Failure Rounding Note  PCP-Cardiologist: No primary care provider on file.   Subjective:   Yesterday had AMS. EEG normal. Quickly back to her baseline.   Frustrated wants to go to a different SNF.   RHC/LHC + Cardiomems placement. Coronary Findings  Diagnostic Dominance: Right Left Main  Mild 20% distal narrowing.  Left Anterior Descending  30% stenosis proximal LAD prior to D1. 50% stenosis LAD at D1. 80-90% stenosis mid LAD. 75% proximal D1 stenosis, moderate-large vessel.  Left Circumflex  Long 70% stenosis ostial to proximal LCx. Tiny OM1 with diffuse severe disease. Small OM2 with 99% proximal stenosis. 60% stenosis proximal PLOM.  Right Coronary Artery  Long area of calcified plaque from the proximal into the mid RCA. 90% stenosis proximally, 80% stenosis in the mid RCA. 80% stenosis mid PDA. 70% stenosis mid PLV.  Intervention  No interventions have been documented. Right Heart  Right Heart Pressures RHC Procedural Findings: Hemodynamics (mmHg) RA mean 2 RV 29/4 PA 27/10, mean 16 PCWP mean 13 LV 116/8 AO 113/53  Oxygen saturations: PA 69% AO 100%  Cardiac Output (Fick) 6.2  Cardiac Index (Fick) 3.63   PCI (3/12): Atherectomy + DES to prox/mid RCA, DES mid LAD.   Objective:   Weight Range: 65 kg Body mass index is 25.38 kg/m.   Vital Signs:   Temp:  [97.9 F (36.6 C)-99.2 F (37.3 C)] 99.2 F (37.3 C) (03/16 1122) Pulse Rate:  [74-83] 79 (03/16 1122) Resp:  [11-17] 17 (03/15 1537) BP: (123-158)/(56-97) 150/65 (03/16 1122) SpO2:  [98 %-100 %] 100 % (03/16 1122) Weight:  [65 kg] 65 kg (03/16 0404) Last BM Date: 11/05/19  Weight change: Filed Weights   11/04/19 0352 11/05/19 0459 11/06/19 0404  Weight: 65.7 kg 65.1 kg 65 kg    Intake/Output:   Intake/Output Summary (Last 24 hours) at 11/06/2019 1206 Last data filed at 11/06/2019 0055 Gross per 24  hour  Intake 443 ml  Output 600 ml  Net -157 ml      Physical Exam   CVP 4.  General: Sitting in the chair. No resp difficulty HEENT: normal Neck: supple. no JVD. Carotids 2+ bilat; no bruits. No lymphadenopathy or thryomegaly appreciated. Cor: PMI nondisplaced. Regular rate & rhythm. No rubs, gallops or murmurs. Lungs: clear Abdomen: soft, nontender, nondistended. No hepatosplenomegaly. No bruits or masses. Good bowel sounds. Extremities: no cyanosis, clubbing, rash, edema. R BKA . RUE PICC Neuro: alert & orientedx3, cranial nerves grossly intact. moves all 4 extremities w/o difficulty. Affect pleasant    Telemetry   NSR 70s   Labs    CBC Recent Labs    11/04/19 0030 11/04/19 2310  WBC  --  8.7  HGB 7.9* 8.1*  HCT 24.1* 24.7*  MCV  --  80.2  PLT  --  194   Basic Metabolic Panel Recent Labs    11/04/19 0500 11/04/19 2310  NA 134* 131*  K 4.2 4.3  CL 101 96*  CO2 24 24  GLUCOSE 203* 222*  BUN 12 12  CREATININE 0.95 1.34*  CALCIUM 8.1* 8.4*   Liver Function Tests Recent Labs    11/04/19 2310  AST 27  ALT 23  ALKPHOS 67  BILITOT 0.7  PROT 5.5*  ALBUMIN 2.4*   No results for input(s): LIPASE, AMYLASE in the last 72 hours. Cardiac Enzymes No results for input(s): CKTOTAL, CKMB, CKMBINDEX,  TROPONINI in the last 72 hours.  BNP: BNP (last 3 results) Recent Labs    11/01/19 0416 11/02/19 0415 11/03/19 0440  BNP 179.2* 173.0* 472.3*    ProBNP (last 3 results) No results for input(s): PROBNP in the last 8760 hours.   D-Dimer No results for input(s): DDIMER in the last 72 hours. Hemoglobin A1C No results for input(s): HGBA1C in the last 72 hours. Fasting Lipid Panel No results for input(s): CHOL, HDL, LDLCALC, TRIG, CHOLHDL, LDLDIRECT in the last 72 hours. Thyroid Function Tests No results for input(s): TSH, T4TOTAL, T3FREE, THYROIDAB in the last 72 hours.  Invalid input(s): FREET3  Other results:   Imaging    EEG adult  Result  Date: 11/05/2019 Jamie Havens, MD     11/05/2019  7:00 PM Patient Name: Jamie [name] MRN: [id number] Epilepsy Attending: Gaelyn [name] Referring Physician/Provider: Dr Dana Allan Date: 11/05/2019 Duration: 23.33 mins Patient history: [name] admitted due to [place] had an episode of transient alteration of awareness. EEG to evaluate for seizure. Level of alertness: awake, asleep AEDs during EEG study: Clonazepam Technical aspects: This EEG study was done with scalp electrodes positioned according to the 10-20 International system of electrode placement. Electrical activity was acquired at a sampling rate of 500Hz  and reviewed with a high frequency filter of 70Hz  and a low frequency filter of 1Hz . EEG data were recorded continuously and digitally stored. DESCRIPTION:  The posterior dominant rhythm consists of 9-10 Hz activity of moderate voltage (25-35 uV) seen predominantly in posterior head regions, symmetric and reactive to eye opening and eye closing. Sleep was characterized by vertex waves, maximal frontocentral. Hyperventilation and photic stimulation were not performed. IMPRESSION: This study is within normal limits. No seizures or epileptiform discharges were seen throughout the recording. Priyanka Barbra Sarks     Medications:     Scheduled Medications: . sodium chloride   Intravenous Once  . amLODipine  10 mg Oral Daily  . aspirin EC  81 mg Oral Daily  . atorvastatin  80 mg Oral QHS  . busPIRone  5 mg Oral Daily  . Chlorhexidine Gluconate Cloth  6 each Topical Daily  . clonazepam  0.25 mg Oral BID  . cloNIDine  0.3 mg Oral BID  . clopidogrel  75 mg Oral Daily  . FLUoxetine  40 mg Oral Daily  . folic acid  1 mg Oral Daily  . heparin  5,000 Units Subcutaneous Q8H  . hydrALAZINE  100 mg Oral Q8H  . insulin aspart  0-5 Units Subcutaneous QHS  . insulin aspart  0-9 Units Subcutaneous TID WC  . insulin glargine  20 Units Subcutaneous Daily  . isosorbide mononitrate  60 mg Oral Daily  .  labetalol  300 mg Oral BID  . multivitamin with minerals  1 tablet Oral Daily  . pantoprazole  40 mg Oral Daily  . rosuvastatin  20 mg Oral q1800  . sodium chloride flush  3 mL Intravenous Q12H  . sodium chloride flush  3 mL Intravenous Q12H  . thiamine  100 mg Oral Daily    Infusions: . sodium chloride      PRN Medications: sodium chloride, acetaminophen, albuterol, ALPRAZolam, hydrALAZINE, HYDROcodone-acetaminophen, ipratropium-albuterol, morphine injection, nitroGLYCERIN, ondansetron (ZOFRAN) IV, sodium chloride flush   Assessment/Plan   1. Acute hypoxemic respiratory failure: COVID-19 viral pneumonia + CHF exacerbation. - Completed steroids and remdesivir for COVID-19.  On nasal cannula. Oxygen levels stable.  2. Acute on chronic diastolic CHF: Multiple admissions over the last 6  months with hypertensive urgency/diastolic CHF.  Echo at [facility] 2/21 with EF 55-60%, restrictive diastolic function.  Echo here with EF 55-60%, normal RV, IVC suggesting RA pressure 8 mmHg.  RHC 3/10 showed optimized filling pressures.  With multiple recent diastolic CHF admissions, we placed a Cardiomems device. More short of breath over weekend, got IV Lasix.   CVP 4. Volume status stable. Tomorrow start lasix 80 mg po daily.  Cardiomems rep completing education today. Will need daily readings at SNF.  3. Pleural effusions: Bilateral.  Had thoracenteses at [facility] and again bilaterally here. Transudates with negative cytology. Suspect due to CHF.   - CXR 3/14  with pleural effusions still present, ?needs drainage again.  May need to re-involve pulmonary.  4. HTN: Poorly controlled at home, admitted with hypertensive emergency/CHF.  HTN slowly getting under better control but still high today.  - Continue amlodipine 10 mg daily.  - Continue hydralazine 100 mg tid. - Continue labetalol to 300 mg bid.  - Continue clonidine 0.3 bid.   - Continue spironolactone 25 mg daily.  - Hold off on ACEI/ARB with  AKI.  - Vascular renal artery dopplers--> .  1-59% stenosis of the right renal artery. Abnormal right  Left: 1-59% stenosis of the left renal artery. Abnormal 5. AKI: Creatinine to 2.4 with diuresis, labs pending. 6. Abdominal pain: CT abdomen earlier in admission with 1.5 cm liver lesion but no other significant abdominal abnormality.  She has had abdominal pain throughout this admission.  CT abdomen 3/1 showed no acute abdominal findings.  7. COPD: Per report on 3L home oxygen, now down to 2L stable.  8. DM: SSI, per [facility].  9. CAD: Coronary angiography today showed severe, calcified 3 vessel disease.  This likely plays a role in her multiple episodes of flash pulmonary edema.  Ideally, based on this anatomy she would have CABG.  However, she has had right BKA and does not have a prosthesis.  She is currently non-ambulatory.  She would not be able to rehab effectively after CABG.  Now s/p atherectomy/PCI to prox/mid LAD and PCI mid LAD. For now, no intervention on LCx disease. HsTnI checked last night and elevated but repeat trended down.  ?if this was post-PCI.   - No chest pain.   -  Stop crestor. Continue atorvastatin 80 mg daily. Continue Crestor, ASA, Plavix.   HF meds for d/c  Lasix 80 mg daily start 11/07/19  Amlodipine 10 mg daily  Aspirin 81 mg daily Atorvastatin 80 mg daily Clonidine 0.3 mg twice a day  Plavix 75 mg daily Hydralazine 100 mg three times a day Imdur 60 mg daily   Labetalol 300 mg twice a day   HF follow up has been set up.    Length of Stay: Ball, NP  11/06/2019, 12:06 PM  Advanced Heart Failure Team Pager [phone] (M-F; [place])  Please contact [facility] for night-coverage after hours (4p -7a ) and weekends on [email or web address]  Patient seen with NP, agree with the above note.  Volume status stable, breathing ok.  CVP 4.  Still pending BMET. She is stable from a cardiac standpoint, can restart Lasix 80 mg daily tomorrow.  She is ready from my  standpoint for discharge to SNF.  Will need to use Cardiomems at SNF so we can manage her volume status remotely.   Loralie Champagne 11/06/2019 1:18 PM

## 2019-11-06 NOTE — Progress Notes (Signed)
Report given to Alvy Beal at [facility]. Discharge instructions and medications reviewed. PICC line removed. Pt is number 22 on PTAR list will leave tele on for now and continue to monitor.  AVS printed and on chart.

## 2019-11-06 NOTE — Progress Notes (Signed)
Attempted to administer insulin and hydralazine. Pt was speaking with palliative care team and said not right now she is in the middle of a conversation. Will try again later.

## 2019-11-06 NOTE — Progress Notes (Signed)
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 She was recently hospitalized twice to [facility] for shortness of breath, her work-up showed bilateral transudative pleural effusion, she had thoracentesis suggesting transudative fluid with no malignant cells. Reason for recurrent effusion was thought to be acute on chronic diastolic CHF. She was also found to have liver mass. She was finally transferred to [facility] for cardiology evaluation for recurrent CHF. Also positive for Covid. (patient transferred out of ICU to [facility] 10/24/19). 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   CLINICAL IMPRESSION: Pt did well with mobility during this session, she was quite upset about possibility of having to return to SNF for rehab at d/c but was able to complete all tasks given. Pt is quite impulsive needing cues and instructions to slow down to be able to complete tasks safely. Pt was able to complete lateral scoot to [facility] with min assist after set up and line management, was able to stand from [facility] and pivot to bed with min assist, able to stand from bed and pivot to recliner using RW. Pt had reported to therapists that she had not been able to use her prosthesis in some time and it may not fit currently sec to increase in weight and fluid retention. Therapist contacted otho techs and had requested to have Stump Shrinker to start working on stump re-shaping for eventual use of prosthesis again. Nursing to put order in for  [employer] to measure and provide shrinker.   D/C RECOMMENDATION: SNF for 24hour care/assist.    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  She was recently hospitalized twice to [facility] for shortness of breath, her work-up showed bilateral transudative pleural effusion, she had thoracentesis suggesting transudative fluid with no malignant cells.  Reason for recurrent effusion was thought to be acute on chronic diastolic CHF.  She was also found to have liver mass.  She was finally transferred to [facility] for cardiology evaluation for recurrent CHF.  Also positive for Covid.  (patient transferred out of ICU to [facility] 10/24/19). 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   Subjective Data  Patient Stated Goal return home at [facility], doesnt not wish to be away from her home of 40 years  Precautions  Precautions Fall  Precaution Comments R BKA April 2020  Required Braces or Orthoses Other Brace (RLE prosthesis but states does not currrently fit)  Restrictions  Weight Bearing  Restrictions No  Pain Assessment  Pain Assessment Faces  Faces Pain Scale 4  Pain Location LLE with WB   Pain Descriptors / Indicators Discomfort;Sore  Pain Intervention(s)  Limited activity within patient's tolerance;Monitored during session  Cognition  Arousal/Alertness Awake/alert  Behavior During Therapy Flat affect;Impulsive;Anxious  Overall Cognitive Status No family/caregiver present to determine baseline cognitive functioning  Bed Mobility  Overal bed mobility Needs Assistance  Bed Mobility Supine to Sit;Sit to Supine  Supine to sit Supervision  Sit to supine Supervision  General bed mobility comments needs cues and line management assist  Transfers  Overall transfer level Needs assistance  Equipment used Rolling walker (2 wheeled)  Transfers Sit to/from Stand;Stand Pivot Transfers  Sit to Stand Min assist;+2 safety/equipment  Stand pivot transfers Min assist;+2 safety/equipment  General transfer comment pt quite impulsive needing cues and instruction for safety, does quite well when not anxious/ apprehensive  Ambulation/Gait  General Gait Details only able to take slight pivotal steps from bed to recliner/commode  Balance  Overall balance assessment Needs assistance  Sitting-balance support Feet supported;No upper extremity supported  Sitting balance-Leahy Scale Good  Standing balance support During functional activity;Bilateral upper extremity supported  Standing balance-Leahy Scale Poor  Standing balance comment needs BUE for safety   PT - End of Session  Equipment Utilized During Treatment Gait belt;Oxygen  Activity Tolerance Patient limited by fatigue;Patient limited by pain  Patient left in chair;with call bell/phone within reach  Nurse Communication Mobility status;Precautions   PT - Assessment/Plan  PT Plan Current plan remains appropriate  PT Visit Diagnosis Muscle weakness (generalized) (M62.81)  PT Frequency (ACUTE ONLY) Min 2X/week  Follow Up Recommendations SNF;Supervision/Assistance - 24 hour  PT equipment 3in1 (PT);Rolling walker with 5" wheels;Wheelchair (measurements PT);Wheelchair cushion (measurements PT);Hospital bed   AM-PAC PT "6 Clicks" Mobility Outcome Measure (Version 2)  Help needed turning from your back to your side while in a flat bed without using bedrails? 3  Help needed moving from lying on your back to sitting on the side of a flat bed without using bedrails? 3  Help needed moving to and from a bed to a chair (including a wheelchair)? 2  Help needed standing up from a chair using your arms (e.g., wheelchair or bedside chair)? 2  Help needed to walk in hospital room? 1  Help needed climbing 3-5 steps with a railing?  1  6 Click Score 12  Consider Recommendation of Discharge To: CIR/SNF/LTACH  PT Goal Progression  Progress towards PT goals Progressing toward goals  Acute Rehab PT Goals  PT Goal Formulation With patient  Time For Goal Achievement 11/06/19  Potential to Achieve Goals Good  PT Time Calculation  PT Start Time (ACUTE ONLY) 1129  PT Stop Time (ACUTE ONLY) 1203  PT Time Calculation (min) (ACUTE ONLY) 34 min  PT General Charges  $$ ACUTE PT VISIT 1 Visit  PT Treatments  $Therapeutic Activity 8-22 mins    Horald Chestnut, PT

## 2019-11-06 NOTE — Discharge Summary (Signed)
Physician Discharge Summary  Patient ID: Jamie [name] MRN: [id number] DOB/AGE: 26-Aug-1960 59 y.o.  Admit date: 10/21/2019 Discharge date: 11/06/2019  Admission Diagnoses:  Discharge Diagnoses:  Active Problems:   -Acute hypoxemic respiratory failure in the setting of acute on chronic diastolic CHF, coronary artery disease (multivessel) and recurrent bilateral pleural effusion.     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    -Acute kidney injury, likely multifactorial.   -Uncontrolled hypertension/hypertensive urgency.   -1.5 cm liver mass based on CT abdomen, but MRI abdomen without contrast did not reveal any mass.   -Diabetes mellitus.   -PAD (peripheral artery disease), status post right BKA.    -Renal artery stenosis.  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  [facility]ness of breath, chest pain, nausea and abdominal pain.  Apparently, patient has had repeated flash pulmonary edema.  Patient was initially admitted to [facility] for shortness of breath, work-up revealed bilateral transudative pleural effusion for which patient underwent thoracentesis.  Notably, CT scan of the abdomen revealed 1.5 cm liver mass, but MRI of the abdomen without contrast was negative for any mass.  Patient will eventually transferred to [facility] for cardiology  evaluation as patient has had recurrent CHF/flash pulmonary edema.  Patient underwent right/left cardiac catheterization.  Left heart catheterization was significant for three-vessel disease.  Patient underwent coronary arthrectomy and PCI to right coronary artery and PCI to mid LAD.  Patient was not deemed a candidate for CABG.  Patient also underwent thoracentesis of bilateral pleural effusion during the hospital stay.  Patient has been optimized, and will be discharged to skilled nursing facility.  Prognosis is guarded.  Palliative or hospice medicine team was consulted prior to discharge.  Hospital stay, patient had a brief episode of unresponsiveness.  CT scan of the brain without contrast was negative.  EEG was normal.  Patient was assessed by cardiology team for unresponsiveness as well.  Severe Acute hypoxic respiratory failure due to recurrent Acute on chronic diastolic CHF EF 56% .   - Recurrent bilateral pleural effusions which are transudate, EF 60%.   - Patient has recurrent episodes of massive pulmonary edema, with recurrent pleural effusion.  Patient has required transfer of care to ICU team multiple times.  Patient has undergone multiple thoracentesis. -Acute respiratory failure has resolved.  -Cardiology input greatly appreciated. -Diuresis per cardiology, patient currently on 40 mg IV Lasix twice daily. -Chest x-ray done on 11/04/2019 revealed significant resolution of the pleural effusion.  History of acute Covid 19 Viral Pneumonitis during the ongoing 2020 Covid 19 Pandemic: -Covid disease was mild to moderate, patient had completed treatment with remdesivir and steroids.  Patient is off isolation.  CAD: -See above.  PAD.   -S/p right BKA, CT evidence of abdominal diffuse calcification with recurrent abdominal pain. Patient likely has diffuse atherosclerosis due to uncontrolled diabetes mellitus and   Uncontrolled hypertension/hypertensive urgency: Resolved.  GERD with  nausea: -Patient has history of diabetic gastroparesis.    Dyslipidemia.  -  Continue statin.   Anemia of chronic disease.  -Hemoglobin today 7.9 g/dL.    Depression and anxiety.   -Continue combination of bupropion, fluoxetine, buspirone and Xanax.  AKI  -Resolved.  Liver mass suspicious on CT scan.   - No mass found on noncontrast MRI of the abdomen.    Hypomagnesemia and hypokalemia.   -Monitored and replete -Need to monitor on discharge.  DM2: -Continue to monitor and optimize.   Brief period of unresponsiveness: -CT head is non-revealing -Follow results of EEG. -Also assessed by the cardiology team.   Consults: cardiology, pulmonary and is on palliative medicine.    Significant Diagnostic Studies:   Cardiac catheterization: Left Main  Mild 20% distal narrowing.  Left Anterior Descending  30% stenosis proximal LAD prior to D1. 50% stenosis LAD at D1. 80-90% stenosis mid LAD. 75% proximal D1 stenosis, moderate-large vessel.  Left Circumflex  Long 70% stenosis ostial to proximal LCx. Tiny OM1 with diffuse severe disease. Small OM2 with 99% proximal stenosis. 60% stenosis proximal PLOM.  Right Coronary Artery  Long area of calcified plaque from the proximal into the mid RCA. 90% stenosis proximally, 80% stenosis in the mid RCA. 80% stenosis mid PDA. 70% stenosis mid PLV.   Thoracentesis left lung: 1300 cc transudative fluid removed.  Thoracentesis right lung: 750 cc of transudative fluid removed.  EEG: Normal  Bilateral renal artery duplex ultrasound: Stenosis (patient is a vasculopath)  Treatments:  Arthrectomy and PCI right coronary artery. PCI mid LAD.  Discharge Exam: Blood pressure (!) 150/65, pulse 79, temperature 99.2 F (37.3 C), temperature source Oral, resp. rate 17, height 5\' 3"  (1.6 m), weight 65 kg, SpO2 100 %.  Disposition: Discharge disposition: 01-Home or Self Care   Discharge Instructions    Diet - low sodium heart healthy    Complete by: As directed    Diet Carb Modified   Complete by: As directed    Increase activity slowly   Complete by: As directed      Allergies as of 11/06/2019      Reactions   Codeine Other (See Comments)   headaches   Meperidine And Related Other (See Comments)   headaches   Tetracyclines & Related Other (See Comments)   Caused yeast infections      Medication List    STOP taking these medications   Basaglar KwikPen 100 UNIT/ML Replaced by: insulin glargine 100 UNIT/ML injection   buPROPion 100 MG tablet Commonly known as: WELLBUTRIN   CALCIUM-CARB 600 PO   furosemide 20 MG tablet Commonly known as: LASIX   lisinopril 20 MG tablet Commonly known as: ZESTRIL   loperamide 2 MG capsule Commonly known as: IMODIUM   Melatonin 5 MG Tabs   ondansetron 8 MG tablet Commonly known as: ZOFRAN   potassium chloride SA 20 MEQ tablet Commonly known as: KLOR-CON   spironolactone 25 MG tablet Commonly known as: ALDACTONE   Vitamin D3 1.25 MG (50000 UT) Tabs     TAKE these medications   acetaminophen 325 MG tablet Commonly known as: TYLENOL Take 650 mg by mouth every 6 (six) hours as needed for headache (pain).   albuterol 108 (90 Base) MCG/ACT inhaler Commonly known as: VENTOLIN HFA Inhale 2 puffs into the lungs every 4 (four) hours as needed for wheezing or shortness of breath.   ALPRAZolam 0.25 MG tablet Commonly known as: XANAX Take 1 tablet (0.25 mg total) by mouth 2 (two) times daily as needed for anxiety. What changed:   medication strength  how much to take  when to take this   amLODipine 10 MG tablet Commonly known as: NORVASC Take 1 tablet (10 mg total) by mouth daily. Start taking on: November 07, 2019 What changed:   medication strength  how much to take   aspirin 81 MG EC tablet Take 1 tablet (81 mg total) by mouth daily. Start taking on: November 07, 2019   atorvastatin 80 MG tablet Commonly known as: LIPITOR Take 80 mg by mouth at  bedtime.   busPIRone 5 MG tablet Commonly known as: BUSPAR Take 5 mg by mouth every morning.   clonazePAM 0.25 MG disintegrating tablet Commonly known as: KLONOPIN Take 1 tablet (0.25 mg total) by mouth 2 (two) times daily.   cloNIDine 0.3 MG tablet Commonly known as: CATAPRES Take 1 tablet (0.3 mg total) by mouth 2 (two) times daily.   clopidogrel 75 MG tablet Commonly known as: PLAVIX Take 75 mg by mouth daily.   FLUoxetine 40 MG capsule Commonly known as: PROZAC Take 40 mg by mouth every morning.   folic acid 1 MG tablet Commonly known as: FOLVITE Take 1 tablet (1 mg total) by mouth daily. Start taking on: November 07, 2019   hydrALAZINE 100 MG tablet Commonly known as: APRESOLINE Take 100 mg by mouth every 8 (eight) hours.   insulin aspart 100 UNIT/ML injection Commonly known as: novoLOG Inject 0-9 Units into the skin 3 (three) times daily with meals. What changed:   how much to take  when to take this   insulin glargine 100 UNIT/ML injection Commonly known as: LANTUS Inject 0.2 mLs (20 Units total) into the skin daily. Start taking on: November 07, 2019 Replaces: Basaglar KwikPen 100 UNIT/ML   ipratropium-albuterol 0.5-2.5 (3) MG/3ML Soln Commonly known as: DUONEB Take 3 mLs by nebulization every 4 (four) hours as needed. What changed:   when to take this  reasons to take this   isosorbide mononitrate 60 MG 24 hr tablet Commonly known as: IMDUR Take 1 tablet (60 mg total) by mouth daily. Start taking on: November 07, 2019 What changed:   medication strength  how much to take  when to take this   labetalol 300 MG tablet Commonly known as: NORMODYNE Take 300 mg by mouth every 12 (twelve) hours.   multivitamin with minerals Tabs tablet Take 1 tablet by mouth daily. Start taking on: November 07, 2019   nitroGLYCERIN 0.4 MG SL tablet Commonly known as: NITROSTAT Place 0.4 mg under the tongue every 5 (five) minutes as needed for chest pain.    pantoprazole 40 MG tablet Commonly known as: PROTONIX Take 40 mg by mouth daily.   polyethylene glycol 17 g packet Commonly known as: MIRALAX / GLYCOLAX Take 17 g by mouth daily as needed (constipation).   thiamine 100 MG tablet Take 1 tablet (100 mg total) by mouth daily. Start taking on: November 07, 2019      Follow-up Information    [facility] Follow up on 11/22/2019.   Specialty: Cardiology Why: 09:30 Contact information: [address] [id number] [place] [phone]          Signed: Bonnell Public 11/06/2019, 2:32 PM

## 2019-11-06 NOTE — Progress Notes (Signed)
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  She was recently hospitalized twice to [facility] for shortness of breath, her work-up showed bilateral transudative pleural effusion, she had thoracentesis suggesting transudative fluid with no malignant cells.  Reason for recurrent effusion was thought to be acute on chronic diastolic CHF.  She was also found to have liver mass.  She was finally transferred to [facility] for cardiology evaluation for recurrent CHF.  Also positive for Covid.  (patient transferred out of ICU to [facility] 10/24/19). 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    OT comments  Patient seen for OT/PT cotx.  Completing toilet transfers (squat pivot and stand pivot with RW) with min assist +2 for safety, min assist for toileting using lateral leaning technique.  Patient distracted during session and reports frustration with having to go back to SNF ("I only get 20 minutes a day"). She is impulsive and requires cueing for safety/techniques throughout session with poor awareness or carryover.  Will follow acutely.  Continue to recommend SNF at this time.    Follow Up Recommendations  SNF;Supervision/Assistance - 24 hour    Equipment Recommendations  Other (comment)(TBD at next venue of care )    Recommendations for Other Services      Precautions / Restrictions Precautions Precautions:  Fall Precaution Comments: R BKA April 2020 Restrictions RLE Weight Bearing: Non weight bearing       Mobility Bed Mobility Overal bed mobility: Needs Assistance Bed Mobility: Supine to Sit;Sit to Supine     Supine to sit: Supervision Sit to supine: Supervision   General bed mobility comments: supervision for safety   Transfers Overall transfer level: Needs assistance Equipment used: Rolling walker (2 wheeled) Transfers: Sit to/from [facility] Sit to Stand: Min assist;+2 safety/equipment Stand pivot transfers: Min assist;+2 safety/equipment       General transfer comment: pt requires cueing for hand placement and safety during transfers, impulsively transitioning and eager to return to sitting again.  Stand pivot to [facility], then transfer back to EOB and to recliner using RW.     Balance Overall balance assessment: Needs assistance Sitting-balance support: No upper extremity supported;Feet supported Sitting balance-Leahy Scale: Good Sitting balance - Comments: supervision for safety, min guard dynamically    Standing balance support: Bilateral upper extremity supported;During functional activity Standing balance-Leahy Scale: Poor Standing balance comment: relaint on BUE and external support                           ADL either performed or assessed with clinical judgement   ADL Overall ADL's : Needs assistance/impaired                         Toilet Transfer: Minimal assistance;+2 for safety/equipment;BSC;Stand-pivot;RW Toilet Transfer Details (indicate cue type and reason): to [facility] stand pivot to commode and using RW returning to EOB   Toileting- Clothing Manipulation and Hygiene: Minimal assistance;Sitting/lateral lean  Toileting - Clothing Manipulation Details (indicate cue type and reason): for safety, lateral leans sitting       Functional mobility during ADLs: Minimal assistance;+2 for safety/equipment General ADL Comments: pt  impulsively transferring during session, requires cueing for safety and techniques      Vision       Perception     Praxis      Cognition Arousal/Alertness: Awake/alert Behavior During Therapy: Flat affect;Impulsive Overall Cognitive Status: No family/caregiver present to determine baseline cognitive functioning                                 General Comments: pt reports frustrations with not having family support to go to CIR as she doesn't want to return to SNF; patient requires cueing throughout session for safety due to impulsivity and poor recall to techniques during transfers and ADLs         Exercises     Shoulder Instructions       General Comments 2L, VSS; patient reports not having a shrinker that will fit  (PT to call and see if she can get one here)     Pertinent Vitals/ Pain       Pain Assessment: Faces Faces Pain Scale: Hurts a little bit Pain Location: LEs  Pain Descriptors / Indicators: Discomfort;Sore Pain Intervention(s): Limited activity within patient's tolerance;Monitored during session;Repositioned  Home Living                                          Prior Functioning/Environment              Frequency  Min 2X/week        Progress Toward Goals  OT Goals(current goals can now be found in the care plan section)  Progress towards OT goals: Progressing toward goals  Acute Rehab OT Goals Patient Stated Goal: to get stronger and get home  OT Goal Formulation: With patient  Plan Discharge plan remains appropriate;Frequency remains appropriate    Co-evaluation    PT/OT/SLP Co-Evaluation/Treatment: Yes Reason for Co-Treatment: For patient/therapist safety;To address functional/ADL transfers   OT goals addressed during session: ADL's and self-care(functional transfers)      AM-PAC OT "6 Clicks" Daily Activity     Outcome Measure   Help from another person eating meals?: None Help from another  person taking care of personal grooming?: A Little Help from another person toileting, which includes using toliet, bedpan, or urinal?: A Lot Help from another person bathing (including washing, rinsing, drying)?: A Little Help from another person to put on and taking off regular upper body clothing?: A Little Help from another person to put on and taking off regular lower body clothing?: A Lot 6 Click Score: 17    End of Session Equipment Utilized During Treatment: Gait belt;Rolling walker;Oxygen  OT Visit Diagnosis: Muscle weakness (generalized) (M62.81);Other abnormalities of gait and mobility (R26.89)   Activity Tolerance Patient tolerated treatment well   Patient Left in chair;with call bell/phone within reach;with chair alarm set   Nurse Communication Mobility status        Time: 8099-8338 OT Time Calculation (min): 33 min  Charges: OT General Charges $OT Visit: 1 Visit OT Treatments $Self Care/Home Management : 8-22 mins  [name] Artist, OT Acute Rehabilitation Services Pager [phone] Office [place]  11/06/2019, 12:44 PM

## 2019-11-12 NOTE — Progress Notes (Signed)
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 Also had cardiomems placed . She was not discharged on lasix. Discharged back to SNF on 11/06/19.   Today she returns for post hospital HF follow up.Overall feeling fine. Says she wants more exercises at the SNF. Her goal is to return home. Mild SOB with exertion. Denies PND/Orthopnea. She has started getting leg edema. Appetite ok. No fever or chills. Last weight at SNF was 149 pounds.  Taking all medications provided at SNF.   Cardiac Studies Echo 10/01/19 with EF 55-60% with restrictive LV filling pattern consistent with elevated LA pressure and borderline concentric LVH  10/20/19 Echo  most recent 10/20/19 with 55-60% with mild concentric LVH, both mean arterial pressure and LVEDP are elevated.  Mild MR and Mild TR trace PR RV systolic pressure is 48 mmHg   Echo 10/01/19 with EF 55-60% with restrictive LV filling pattern consistent with elevated LA pressure and borderline concentric LVH  10/31/19 RHC/LHC + Cardiomems placement. Coronary Findings  Diagnostic Dominance: Right Left Main  Mild 20% distal narrowing.  Left Anterior Descending  30% stenosis proximal LAD prior to D1. 50% stenosis LAD at D1. 80-90% stenosis mid LAD. 75% proximal D1 stenosis, moderate-large vessel.  Left  Circumflex  Long 70% stenosis ostial to proximal LCx. Tiny OM1 with diffuse severe disease. Small OM2 with 99% proximal stenosis. 60% stenosis proximal PLOM.  Right Coronary Artery  Long area of calcified plaque from the proximal into the mid RCA. 90% stenosis proximally, 80% stenosis in the mid RCA. 80% stenosis mid PDA. 70% stenosis mid PLV.  Intervention  No interventions have been documented. Right Heart  Right Heart Pressures RHC Procedural Findings: Hemodynamics (mmHg) RA mean 2 RV 29/4 PA 27/10, mean 16 PCWP mean 13 LV 116/8 AO 113/53  Oxygen saturations: PA 69% AO 100%  Cardiac Output (Fick) 6.2  Cardiac Index (Fick) 3.63   PCI (3/12): Atherectomy + DES to prox/mid RCA, DES mid LAD.      ROS: All systems negative except as listed in HPI, PMH and Problem List.  SH:  Social History   Socioeconomic History  . Marital status: Unknown    Spouse name: Not on file  . Number of children: Not on file  . Years of education: Not on file  . Highest education level: Not on file  Occupational History  . Not on file  Tobacco Use  . Smoking status: Never Smoker  . Smokeless tobacco: Never Used  Substance and Sexual Activity  . Alcohol use: Never  . Drug use: Never  . Sexual activity: Not on file  Other Topics Concern  . Not on file  Social History Narrative  . Not on file   Social Determinants of Health   Financial Resource Strain:   . Difficulty of Paying Living Expenses:   Food Insecurity:   . Worried About [employer] of  Food in the Last Year:   . [place] in the Last Year:   Transportation Needs: No Transportation Needs  . Lack of Transportation (Medical): No  . Lack of Transportation (Non-Medical): No  Physical Activity: Inactive  . Days of Exercise per Week: 0 days  . Minutes of Exercise per Session: 0 min  Stress:   . Feeling of Stress :   Social Connections:   . Frequency of Communication with Friends and Family:   . Frequency of  Social Gatherings with Friends and Family:   . Attends Religious Services:   . Active Member of Clubs or Organizations:   . Attends Archivist Meetings:   Marland Kitchen Marital Status:   Intimate Partner Violence:   . Fear of Current or Ex-Partner:   . Emotionally Abused:   Marland Kitchen Physically Abused:   . Sexually Abused:     FH:  Family History  Problem Relation Age of Onset  . Hypertension Brother   . Diabetes Brother   . Hypertension Maternal Grandmother   . Heart attack Paternal Grandmother     Past Medical History:  Diagnosis Date  . Amputation below knee ([place]) RT   . Chronic diastolic HF (heart failure) ([place]) 10/21/2019  . COPD (chronic obstructive pulmonary disease) ([place])   . Diabetes type 2, uncontrolled ([place])   . Gastritis and duodenitis   . HTN (hypertension) 10/21/2019  . PAD (peripheral artery disease) ([place]) 10/21/2019  . PAD (peripheral artery disease) ([place])   . PNA (pneumonia)   . TIA (transient ischemic attack)     Current Outpatient Medications  Medication Sig Dispense Refill  . acetaminophen (TYLENOL) 325 MG tablet Take 650 mg by mouth every 6 (six) hours as needed for headache (pain).    Marland Kitchen albuterol (VENTOLIN HFA) 108 (90 Base) MCG/ACT inhaler Inhale 2 puffs into the lungs every 4 (four) hours as needed for wheezing or shortness of breath.    . ALPRAZolam (XANAX) 0.25 MG tablet Take 1 tablet (0.25 mg total) by mouth 2 (two) times daily as needed for anxiety. 30 tablet 0  . amLODipine (NORVASC) 10 MG tablet Take 1 tablet (10 mg total) by mouth daily. 30 tablet 0  . aspirin EC 81 MG EC tablet Take 1 tablet (81 mg total) by mouth daily. 30 tablet 0  . atorvastatin (LIPITOR) 80 MG tablet Take 80 mg by mouth at bedtime.    . busPIRone (BUSPAR) 5 MG tablet Take 5 mg by mouth every morning.    . clonazePAM (KLONOPIN) 0.25 MG disintegrating tablet Take 1 tablet (0.25 mg total) by mouth 2 (two) times daily. 60 tablet 0  . cloNIDine (CATAPRES) 0.3 MG tablet Take 1 tablet (0.3  mg total) by mouth 2 (two) times daily. 60 tablet 0  . clopidogrel (PLAVIX) 75 MG tablet Take 75 mg by mouth daily.    Marland Kitchen FLUoxetine (PROZAC) 40 MG capsule Take 40 mg by mouth every morning.    . folic acid (FOLVITE) 1 MG tablet Take 1 tablet (1 mg total) by mouth daily. 30 tablet 0  . hydrALAZINE (APRESOLINE) 100 MG tablet Take 100 mg by mouth every 8 (eight) hours.    . insulin aspart (NOVOLOG) 100 UNIT/ML injection Inject 0-9 Units into the skin 3 (three) times daily with meals. 10 mL 11  . insulin glargine (LANTUS) 100 UNIT/ML injection Inject 0.2 mLs (20 Units total) into the skin daily. 10 mL 11  . ipratropium-albuterol (DUONEB) 0.5-2.5 (3) MG/3ML SOLN Take 3 mLs  by nebulization every 4 (four) hours as needed. 360 mL 0  . isosorbide mononitrate (IMDUR) 60 MG 24 hr tablet Take 1 tablet (60 mg total) by mouth daily. 30 tablet 0  . labetalol (NORMODYNE) 300 MG tablet Take 300 mg by mouth every 12 (twelve) hours.    . Multiple Vitamin (MULTIVITAMIN WITH MINERALS) TABS tablet Take 1 tablet by mouth daily. 30 tablet 0  . nitroGLYCERIN (NITROSTAT) 0.4 MG SL tablet Place 0.4 mg under the tongue every 5 (five) minutes as needed for chest pain.    . pantoprazole (PROTONIX) 40 MG tablet Take 40 mg by mouth daily.    . polyethylene glycol (MIRALAX / GLYCOLAX) 17 g packet Take 17 g by mouth daily as needed (constipation).    . thiamine 100 MG tablet Take 1 tablet (100 mg total) by mouth daily. 30 tablet 0   No current facility-administered medications for this encounter.    Vitals:   11/13/19 0944  BP: 102/74  Pulse: 79  SpO2: 97%  Weight: 67 kg (147 lb 12.8 oz)   Wt Readings from Last 3 Encounters:  11/13/19 67 kg (147 lb 12.8 oz)  11/06/19 65 kg (143 lb 4.8 oz)    PHYSICAL EXAM: General:  Arrived in a wheel chair.  No resp difficulty HEENT: normal Neck: supple. JVP 11-12. Carotids 2+ bilaterally; no bruits. No lymphadenopathy or thryomegaly appreciated. Cor: PMI normal. Regular rate &  rhythm. No rubs, gallops or murmurs. Lungs: Decreased in the bases  Abdomen: soft, nontender, nondistended. No hepatosplenomegaly. No bruits or masses. Good bowel sounds. Extremities: no cyanosis, clubbing, rash, LLE 1+ R BKA 1+ edema Neuro: alert & orientedx3, cranial nerves grossly intact. Moves all 4 extremities w/o difficulty. Affect pleasant.   ECG: SR 79 bpm   ASSESSMENT & PLAN: 1. Chronic hypoxemic respiratory failure: 09/2019 COVID-19 viral pneumonia + CHF exacerbation. - Completed steroids and remdesivir for COVID-19. - Sats stable on room air   2. Chronic diastolic CHF: Multiple admissions over the last 6 months with hypertensive urgency/diastolic CHF.  Echo at [facility] 2/21 with EF 55-60%, restrictive diastolic function. Echo here with EF 55-60%, normal RV, IVC suggesting RA pressure 8 mmHg. RHC 3/10 showed optimized filling pressures.  With multiple recent diastolic CHF admissions, we placed a Cardiomems device.  Cardiomems reading on the day of discharge was 12. Todays reading is up to 17. Plan to continue daily transmissions.  Volume overloaded today. Start lasix 80 mg daily. Check BMET today and in 7 days.  Discussed low salt food choices and limiting fluid intake to < 2 liters per day   3. Pleural effusions: 09/2019 Had thoracenteses at [facility] and again bilaterally here. Transudates with negative cytology. Suspect due to CHF.    4. HTN:  - Stable  - Continue amlodipine 10 mg daily.  - Continue hydralazine 100 mg tid. - Continue labetalol to 300 mg bid.  - Continue clonidine 0.3 bid.   - Continue spironolactone 25 mg daily.  - Hold off on ACEI/ARB with AKI.  - Vascular renal artery dopplers--> .  1-59% stenosis of the right renal artery. Abnormal right  Left: 1-59% stenosis of the left renal artery. Abnormal  5. AKI: H?o Check BMET otday   6. Abdominal pain: CT abdomen earlier in admission with 1.5 cm liver lesion but no other significant abdominal abnormality.  She has had abdominal pain throughout this admission.  CT abdomen 3/1 showed no acute abdominal findings.   7. COPD: Per report on 3L home  oxygen, now down to 2L stable.   8. DM: Per SNF Hgb A1C 6.9 on 10/23/19. Consider SGLT2i.   9. CAD: Coronary angiography today showed severe, calcified 3 vessel disease.  This likely plays a role in her multiple episodes of flash pulmonary edema.  Ideally, based on this anatomy she would have CABG.  However, she has had right BKA and does not have a prosthesis.  She is currently non-ambulatory.  She would not be able to rehab effectively after CABG.   Now s/p atherectomy/PCI to prox/mid LAD and PCI mid LAD. For now, no intervention on LCx disease. HsTnI checked last night and elevated but repeat trended down.  ?if this was post-PCI.   - Had some chest tightness with exertion. Suspect partly due to volume overload. - Starting lasix and increase imdur to 90 mg daily. .   Continue atorvastatin 80 mg daily. Continue Crestor, Plavix.    Follow up in 2 weeks with APP and 8 week with Dr Aundra Dubin.   Viann Nielson NP-C  2:42 PM

## 2019-11-13 ENCOUNTER — Ambulatory Visit (HOSPITAL_COMMUNITY)
Admit: 2019-11-13 | Discharge: 2019-11-13 | Disposition: A | Discharge status: Home / Self Care | Payer: Medicaid Other | Source: Ambulatory Visit | Attending: Adult Health | Admitting: Adult Health

## 2019-11-13 ENCOUNTER — Other Ambulatory Visit: Payer: Self-pay

## 2019-11-13 VITALS — BP 102/74 | HR 79 | Wt 147.8 lb

## 2019-11-13 DIAGNOSIS — I10 Essential (primary) hypertension: Secondary | ICD-10-CM

## 2019-11-13 DIAGNOSIS — J9601 Acute respiratory failure with hypoxia: Secondary | ICD-10-CM

## 2019-11-13 DIAGNOSIS — I701 Atherosclerosis of renal artery: Secondary | ICD-10-CM | POA: Insufficient documentation

## 2019-11-13 DIAGNOSIS — Z7902 Long term (current) use of antithrombotics/antiplatelets: Secondary | ICD-10-CM | POA: Diagnosis not present

## 2019-11-13 DIAGNOSIS — E1151 Type 2 diabetes mellitus with diabetic peripheral angiopathy without gangrene: Secondary | ICD-10-CM | POA: Diagnosis not present

## 2019-11-13 DIAGNOSIS — Z833 Family history of diabetes mellitus: Secondary | ICD-10-CM | POA: Insufficient documentation

## 2019-11-13 DIAGNOSIS — Z79899 Other long term (current) drug therapy: Secondary | ICD-10-CM | POA: Insufficient documentation

## 2019-11-13 DIAGNOSIS — Z7982 Long term (current) use of aspirin: Secondary | ICD-10-CM | POA: Diagnosis not present

## 2019-11-13 DIAGNOSIS — Z8673 Personal history of transient ischemic attack (TIA), and cerebral infarction without residual deficits: Secondary | ICD-10-CM | POA: Diagnosis not present

## 2019-11-13 DIAGNOSIS — J90 Pleural effusion, not elsewhere classified: Secondary | ICD-10-CM | POA: Insufficient documentation

## 2019-11-13 DIAGNOSIS — Z8249 Family history of ischemic heart disease and other diseases of the circulatory system: Secondary | ICD-10-CM | POA: Diagnosis not present

## 2019-11-13 DIAGNOSIS — Z9981 Dependence on supplemental oxygen: Secondary | ICD-10-CM | POA: Diagnosis not present

## 2019-11-13 DIAGNOSIS — J449 Chronic obstructive pulmonary disease, unspecified: Secondary | ICD-10-CM | POA: Diagnosis not present

## 2019-11-13 DIAGNOSIS — Z794 Long term (current) use of insulin: Secondary | ICD-10-CM | POA: Insufficient documentation

## 2019-11-13 DIAGNOSIS — F329 Major depressive disorder, single episode, unspecified: Secondary | ICD-10-CM | POA: Diagnosis not present

## 2019-11-13 DIAGNOSIS — I5032 Chronic diastolic (congestive) heart failure: Secondary | ICD-10-CM | POA: Insufficient documentation

## 2019-11-13 DIAGNOSIS — J9611 Chronic respiratory failure with hypoxia: Secondary | ICD-10-CM | POA: Insufficient documentation

## 2019-11-13 DIAGNOSIS — I739 Peripheral vascular disease, unspecified: Secondary | ICD-10-CM | POA: Diagnosis not present

## 2019-11-13 DIAGNOSIS — I2510 Atherosclerotic heart disease of native coronary artery without angina pectoris: Secondary | ICD-10-CM | POA: Insufficient documentation

## 2019-11-13 DIAGNOSIS — Z89511 Acquired absence of right leg below knee: Secondary | ICD-10-CM | POA: Diagnosis not present

## 2019-11-13 DIAGNOSIS — E7800 Pure hypercholesterolemia, unspecified: Secondary | ICD-10-CM | POA: Insufficient documentation

## 2019-11-13 DIAGNOSIS — K219 Gastro-esophageal reflux disease without esophagitis: Secondary | ICD-10-CM | POA: Insufficient documentation

## 2019-11-13 DIAGNOSIS — Z8616 Personal history of COVID-19: Secondary | ICD-10-CM | POA: Insufficient documentation

## 2019-11-13 DIAGNOSIS — I110 Hypertensive heart disease with heart failure: Secondary | ICD-10-CM | POA: Diagnosis not present

## 2019-11-13 LAB — BASIC METABOLIC PANEL
Anion gap: 10 (ref 5–15)
BUN: 16 mg/dL (ref 6–20)
CO2: 21 mmol/L — ABNORMAL LOW (ref 22–32)
Calcium: 8.6 mg/dL — ABNORMAL LOW (ref 8.9–10.3)
Chloride: 102 mmol/L (ref 98–111)
Creatinine, Ser: 0.96 mg/dL (ref 0.44–1.00)
GFR calc Af Amer: >60 mL/min (ref >60)
GFR calc non Af Amer: >60 mL/min (ref >60)
Glucose, Bld: 467 mg/dL — ABNORMAL HIGH (ref 70–99)
Potassium: 4.4 mmol/L (ref 3.5–5.1)
Sodium: 133 mmol/L — ABNORMAL LOW (ref 135–145)

## 2019-11-13 LAB — BRAIN NATRIURETIC PEPTIDE: B Natriuretic Peptide: 923.4 pg/mL — ABNORMAL HIGH (ref 0.0–100.0)

## 2019-11-13 MED ORDER — ISOSORBIDE MONONITRATE ER 60 MG PO TB24: 90.0000 mg | ORAL_TABLET | Freq: Every day | ORAL | 0 refills | Status: DC

## 2019-11-13 MED ORDER — FUROSEMIDE 40 MG PO TABS: 80.0000 mg | ORAL_TABLET | Freq: Every day | ORAL | 11 refills | Status: DC

## 2019-11-13 NOTE — Patient Instructions (Signed)
INCREASE Lasix to 80 mg (2 tabs) daily INCREASE Imdur to 90 mg (one and one half tab daily)  Labs today We will only contact you if something comes back abnormal or we need to make some changes. Otherwise no news is good news!  Your physician recommends that you schedule a follow-up appointment in: 2 weeks  in the Advanced Practitioners (PA/NP) Clinic   Your physician recommends that you schedule a follow-up appointment in: 8 weeks with Dr Aundra Dubin  Do the following things EVERYDAY: 1) Weigh yourself in the morning before breakfast. Write it down and keep it in a log. 2) Take your medicines as prescribed 3) Eat low salt foods-Limit salt (sodium) to 2000 mg per day.  4) Stay as active as you can everyday 5) Limit all fluids for the day to less than 2 liters  At the [facility], you and your health needs are our priority. As part of our continuing mission to provide you with exceptional heart care, we have created designated Provider Care Teams. These Care Teams include your primary Cardiologist (physician) and Advanced Practice Providers (APPs- Physician Assistants and Nurse Practitioners) who all work together to provide you with the care you need, when you need it.   You may see any of the following providers on your designated Care Team at your next follow up: Marland Kitchen Dr Glori Bickers . Dr Loralie Champagne . Darrick Grinder, NP . Lyda Jester, PA . Audry Riles, PharmD   Please be sure to bring in all your medications bottles to every appointment.

## 2019-11-14 ENCOUNTER — Encounter: Payer: Self-pay | Admitting: Pulmonary Disease

## 2019-11-14 ENCOUNTER — Ambulatory Visit (INDEPENDENT_AMBULATORY_CARE_PROVIDER_SITE_OTHER): Payer: Medicaid Other | Admitting: Pulmonary Disease

## 2019-11-14 ENCOUNTER — Telehealth (HOSPITAL_COMMUNITY): Payer: Self-pay | Admitting: *Deleted

## 2019-11-14 VITALS — BP 120/70 | HR 80 | Ht 63.0 in | Wt 149.0 lb

## 2019-11-14 DIAGNOSIS — I5033 Acute on chronic diastolic (congestive) heart failure: Secondary | ICD-10-CM | POA: Diagnosis not present

## 2019-11-14 DIAGNOSIS — J90 Pleural effusion, not elsewhere classified: Secondary | ICD-10-CM | POA: Diagnosis not present

## 2019-11-14 DIAGNOSIS — I2510 Atherosclerotic heart disease of native coronary artery without angina pectoris: Secondary | ICD-10-CM | POA: Diagnosis not present

## 2019-11-14 DIAGNOSIS — Z955 Presence of coronary angioplasty implant and graft: Secondary | ICD-10-CM

## 2019-11-14 DIAGNOSIS — J948 Other specified pleural conditions: Secondary | ICD-10-CM | POA: Diagnosis not present

## 2019-11-14 DIAGNOSIS — Z7902 Long term (current) use of antithrombotics/antiplatelets: Secondary | ICD-10-CM

## 2019-11-14 NOTE — Patient Instructions (Addendum)
Thank you for visiting Dr. Valeta Harms at [facility]. Today we recommend the following:  No plans for thoracentesis or pleurex placement (IPC) at this time.  IPC is used for palliation in the setting of heart failure.  If this is desired in the future we are happy to arrange Continue diuresis up titration per cardiology   Return if symptoms worsen or fail to improve.    Please do your part to reduce the spread of COVID-19.

## 2019-11-14 NOTE — Progress Notes (Signed)
Synopsis: Referred in March 2021 for recurrent pleural effusion by No ref. provider found  Subjective:   PATIENT ID: Jamie Fila GENDER: female DOB: 1961-05-06, MRN: [id number]  Chief Complaint  Patient presents with   Hospitalization Follow-up    Pt states she has been doing okay since being out of the hospital. States she will occ have chest pain and also has soreness in her back which she has been told that it could be due to the fluid.    This is a 59 year old female past medical history of COPD, hypertension, TIA, type 2 diabetes.  Patient had recent admission in February 2021.  She was discharged on 11/06/2019 patient admitted for pulmonary edema she had mild COVID-19, found to be positive treated with remdesivir and steroids.  Hospital admission required multiple episodes of pulmonary edema and pleural effusion.  She had undergone multiple thoracentesis for recurrent pleural effusion during hospitalization.  Each of the patient's thoracentesis were considered change he dated by fluid analysis low protein low LDH.  On 11/04/2019 patient did have chest x-ray with recurrence of pleural effusion.  During hospitalization patient was seen by advanced heart failure service.  Patient was taken for cardiac catheterization right and left.  Patient had severe three-vessel coronary disease on catheterization and a cardio main was implanted into the pulmonary artery on the left.  2 days after cardioversion catheterization patient was seen by interventional team and decision was made for drug-eluting stent placement.  Patient underwent DES placement to the proximal and mid RCA and DES placement to the mid LAD.  Patient currently maintained on Plavix.  OV 11/14/2019: Here to discuss reaccumulation of pleural effusions.  Patient was discharged on diuretic regimen.  Patient has a post hospital follow-up office visit in the cardiology clinic yesterday with recommendations of increasing Lasix to 80 mg daily.  Full  respiratory standpoint she is improving.  She does feel like her physical activity level is still significantly limited but this is predominantly due to wheelchair bound below the knee amputation.   Past Medical History:  Diagnosis Date   Amputation below knee ([place]) RT    Chronic diastolic HF (heart failure) ([place]) 10/21/2019   COPD (chronic obstructive pulmonary disease) (HCC)    Diabetes type 2, uncontrolled (HCC)    Gastritis and duodenitis    HTN (hypertension) 10/21/2019   PAD (peripheral artery disease) ([place]) 10/21/2019   PAD (peripheral artery disease) (HCC)    PNA (pneumonia)    TIA (transient ischemic attack)      Family History  Problem Relation Age of Onset   Hypertension Brother    Diabetes Brother    Hypertension Maternal Grandmother    Heart attack Paternal Grandmother      Past Surgical History:  Procedure Laterality Date   BELOW KNEE LEG AMPUTATION Right    CORONARY ATHERECTOMY N/A 11/02/2019   Procedure: CORONARY ATHERECTOMY;  Surgeon: Martinique, Peter M, MD;  Location: [facility];  Service: Cardiovascular;  Laterality: N/A;   CORONARY STENT INTERVENTION N/A 11/02/2019   Procedure: CORONARY STENT INTERVENTION;  Surgeon: Martinique, Peter M, MD;  Location: [facility];  Service: Cardiovascular;  Laterality: N/A;   PRESSURE SENSOR/CARDIOMEMS N/A 10/31/2019   Procedure: PRESSURE SENSOR/CARDIOMEMS;  Surgeon: Larey Dresser, MD;  Location: [facility];  Service: Cardiovascular;  Laterality: N/A;   RIGHT/LEFT HEART CATH AND CORONARY ANGIOGRAPHY N/A 10/31/2019   Procedure: RIGHT/LEFT HEART CATH AND CORONARY ANGIOGRAPHY;  Surgeon: Larey Dresser, MD;  Location: [facility]  INVASIVE CV LAB;  Service: Cardiovascular;  Laterality: N/A;    Social History   Socioeconomic History   Marital status: Unknown    Spouse name: Not on file   Number of children: Not on file   Years of education: Not on file   Highest education level: Not on file    Occupational History   Not on file  Tobacco Use   Smoking status: Never Smoker   Smokeless tobacco: Never Used  Substance and Sexual Activity   Alcohol use: Never   Drug use: Never   Sexual activity: Not on file  Other Topics Concern   Not on file  Social History Narrative   Not on file   Social Determinants of Health   Financial Resource Strain:    Difficulty of Paying Living Expenses:   Food Insecurity:    Worried About [place] in the Last Year:    [place] in the Last Year:   Transportation Needs: No Transportation Needs   Lack of Transportation (Medical): No   Lack of Transportation (Non-Medical): No  Physical Activity: Inactive   Days of Exercise per Week: 0 days   Minutes of Exercise per Session: 0 min  Stress:    Feeling of Stress :   Social Connections:    Frequency of Communication with Friends and Family:    Frequency of Social Gatherings with Friends and Family:    Attends Religious Services:    Active Member of Clubs or Organizations:    Attends Archivist Meetings:    Marital Status:   Intimate Partner Violence:    Fear of Current or Ex-Partner:    Emotionally Abused:    Physically Abused:    Sexually Abused:      Allergies  Allergen Reactions   Codeine Other (See Comments)    headaches   Meperidine And Related Other (See Comments)    headaches   Tetracyclines & Related Other (See Comments)    Caused yeast infections     Outpatient Medications Prior to Visit  Medication Sig Dispense Refill   acetaminophen (TYLENOL) 325 MG tablet Take 650 mg by mouth every 6 (six) hours as needed for headache (pain).     albuterol (VENTOLIN HFA) 108 (90 Base) MCG/ACT inhaler Inhale 2 puffs into the lungs every 4 (four) hours as needed for wheezing or shortness of breath.     ALPRAZolam (XANAX) 0.25 MG tablet Take 1 tablet (0.25 mg total) by mouth 2 (two) times daily as needed for anxiety. 30 tablet 0    amLODipine (NORVASC) 10 MG tablet Take 1 tablet (10 mg total) by mouth daily. 30 tablet 0   aspirin EC 81 MG EC tablet Take 1 tablet (81 mg total) by mouth daily. 30 tablet 0   atorvastatin (LIPITOR) 80 MG tablet Take 80 mg by mouth at bedtime.     busPIRone (BUSPAR) 5 MG tablet Take 5 mg by mouth every morning.     clonazePAM (KLONOPIN) 0.25 MG disintegrating tablet Take 1 tablet (0.25 mg total) by mouth 2 (two) times daily. 60 tablet 0   cloNIDine (CATAPRES) 0.3 MG tablet Take 1 tablet (0.3 mg total) by mouth 2 (two) times daily. 60 tablet 0   clopidogrel (PLAVIX) 75 MG tablet Take 75 mg by mouth daily.     FLUoxetine (PROZAC) 40 MG capsule Take 40 mg by mouth every morning.     folic acid (FOLVITE) 1 MG tablet Take 1 tablet (1 mg total)  by mouth daily. 30 tablet 0   furosemide (LASIX) 40 MG tablet Take 2 tablets (80 mg total) by mouth daily. 60 tablet 11   hydrALAZINE (APRESOLINE) 100 MG tablet Take 100 mg by mouth every 8 (eight) hours.     insulin aspart (NOVOLOG) 100 UNIT/ML injection Inject 0-9 Units into the skin 3 (three) times daily with meals. 10 mL 11   insulin glargine (LANTUS) 100 UNIT/ML injection Inject 0.2 mLs (20 Units total) into the skin daily. 10 mL 11   ipratropium-albuterol (DUONEB) 0.5-2.5 (3) MG/3ML SOLN Take 3 mLs by nebulization every 4 (four) hours as needed. 360 mL 0   isosorbide mononitrate (IMDUR) 60 MG 24 hr tablet Take 1.5 tablets (90 mg total) by mouth daily. 45 tablet 0   labetalol (NORMODYNE) 300 MG tablet Take 300 mg by mouth every 12 (twelve) hours.     Multiple Vitamin (MULTIVITAMIN WITH MINERALS) TABS tablet Take 1 tablet by mouth daily. 30 tablet 0   nitroGLYCERIN (NITROSTAT) 0.4 MG SL tablet Place 0.4 mg under the tongue every 5 (five) minutes as needed for chest pain.     pantoprazole (PROTONIX) 40 MG tablet Take 40 mg by mouth daily.     polyethylene glycol (MIRALAX / GLYCOLAX) 17 g packet Take 17 g by mouth daily as needed  (constipation).     thiamine 100 MG tablet Take 1 tablet (100 mg total) by mouth daily. 30 tablet 0   No facility-administered medications prior to visit.    Review of Systems  Constitutional: Positive for malaise/fatigue. Negative for chills, fever and weight loss.  HENT: Negative for hearing loss, sore throat and tinnitus.   Eyes: Negative for blurred vision and double vision.  Respiratory: Positive for shortness of breath. Negative for cough, hemoptysis, sputum production, wheezing and stridor.   Cardiovascular: Positive for leg swelling. Negative for chest pain, palpitations, orthopnea and PND.  Gastrointestinal: Negative for abdominal pain, constipation, diarrhea, heartburn, nausea and vomiting.  Genitourinary: Negative for dysuria, hematuria and urgency.  Musculoskeletal: Negative for joint pain and myalgias.  Skin: Negative for itching and rash.  Neurological: Negative for dizziness, tingling, weakness and headaches.  Endo/Heme/Allergies: Negative for environmental allergies. Does not bruise/bleed easily.  Psychiatric/Behavioral: Negative for depression. The patient is not nervous/anxious and does not have insomnia.   All other systems reviewed and are negative.    Objective:  Physical Exam Vitals reviewed.  Constitutional:      General: She is not in acute distress.    Appearance: She is well-developed.  HENT:     Head: Normocephalic and atraumatic.  Eyes:     General: No scleral icterus.    Conjunctiva/sclera: Conjunctivae normal.     Pupils: Pupils are equal, round, and reactive to light.  Neck:     Vascular: No JVD.     Trachea: No tracheal deviation.  Cardiovascular:     Rate and Rhythm: Normal rate and regular rhythm.     Heart sounds: Normal heart sounds. No murmur.  Pulmonary:     Effort: Pulmonary effort is normal. No tachypnea, accessory muscle usage or respiratory distress.     Breath sounds: No stridor. No wheezing, rhonchi or rales.     Comments:  Absent breaths in the bases BL  Abdominal:     General: Bowel sounds are normal. There is no distension.     Palpations: Abdomen is soft.     Tenderness: There is no abdominal tenderness.  Musculoskeletal:        General:  Deformity (Right BKA) present. No tenderness.     Cervical back: Neck supple.     Left lower leg: Edema present.  Lymphadenopathy:     Cervical: No cervical adenopathy.  Skin:    General: Skin is warm and dry.     Capillary Refill: Capillary refill takes less than 2 seconds.     Findings: No rash.  Neurological:     Mental Status: She is alert and oriented to person, place, and time.  Psychiatric:        Behavior: Behavior normal.      Vitals:   11/14/19 1116  BP: 120/70  Pulse: 80  SpO2: 100%  Weight: 149 lb (67.6 kg)  Height: 5\' 3"  (1.6 m)   100% on 2 LNC BMI Readings from Last 3 Encounters:  11/14/19 26.39 kg/m  11/13/19 26.18 kg/m  11/06/19 25.38 kg/m   Wt Readings from Last 3 Encounters:  11/14/19 149 lb (67.6 kg)  11/13/19 147 lb 12.8 oz (67 kg)  11/06/19 143 lb 4.8 oz (65 kg)     CBC    Component Value Date/Time   WBC 8.7 11/04/2019 2310   RBC 3.08 (L) 11/04/2019 2310   HGB 8.1 (L) 11/04/2019 2310   HCT 24.7 (L) 11/04/2019 2310   PLT 199 11/04/2019 2310   MCV 80.2 11/04/2019 2310   MCH 26.3 11/04/2019 2310   MCHC 32.8 11/04/2019 2310   RDW 14.1 11/04/2019 2310   LYMPHSABS 0.5 (L) 11/03/2019 0440   MONOABS 0.5 11/03/2019 0440   EOSABS 0.1 11/03/2019 0440   BASOSABS 0.0 11/03/2019 0440     Chest Imaging: 2 view chest x-ray 11/04/2019: Bilateral pleural effusion. The patient's images have been independently reviewed by me.    Pulmonary Functions Testing Results: No flowsheet data found.  FeNO: None   Pathology: None  Echocardiogram:  10/23/2019 echocardiogram: Grade 2 diastolic dysfunction normal ejection fraction 55 to 60%.  Heart Catheterization:  March 2020 Right and left heart catheterization    Assessment &  Plan:     ICD-10-CM   1. Acute on chronic diastolic CHF (congestive heart failure) (HCC)  I50.33   2. Bilateral pleural effusion  J90   3. Transudative pleural effusion  J94.8   4. Coronary artery disease involving native coronary artery of native heart without angina pectoris  I25.10   5. S/P drug eluting coronary stent placement  Z95.5   6. Antiplatelet or antithrombotic long-term use  Z79.02     Discussion: This is a 59 year old female recent hospitalization for decompensated heart failure bilateral effusions multiple thoracentesis.  Now discharged to a rehabilitation facility.  Still with ongoing bilateral effusions.  Management of Lasix per cardiology for up titration in an effort to prevent rehospitalization for acute on chronic decompensated heart failure, diastolic disease with bilateral pleural effusion  In the future we can consider repeat thoracentesis.  She is currently on antiplatelets for recent drug-eluting stent placement.  We discussed risk benefits and alternatives of proceeding with thoracentesis on antiplatelet.  She would like to hold off on thoracentesis at this time in which I agree as she is just now increasing her diuretic regimen.  In the future if she has recurrent effusion collections and are pursuing a more palliative care approach would consider IPC placement only if the patient has made decisions to pursue palliation in the setting of heart failure.  Plan Following Extensive Data Review & Interpretation:   I reviewed prior external note(s) from March 2021 discharge summary Dr. Marthenia Rolling, cardiology  consultation note Dr. Aundra Dubin.  Post hospital discharge cardiology office visit 11/13/2019.  I reviewed the result(s) of echocardiogram, right and left heart catheterization, pleural fluid analysis from multiple thoracentesis  I have ordered bedside ultrasound to be completed today.  Recommended increased dosing of Lasix.  Independent interpretation of tests  Review of  patient's chest imaging, chest x-ray 11/04/2019 bilateral pleural effusio. The patient's images have been independently reviewed by me.    Bedside ultrasound chest procedure note:  Butterfly ultrasound system was used to visualize both bilateral pleural spaces.  There was evidence of free-flowing fluid in both chest cavities more so in the left than the right.  There is no evidence of loculations or fibrin deposition.  Images of both right and left thorax were stored on the butterfly cloud system with appropriate name & MRN documentation.    Current Outpatient Medications:    acetaminophen (TYLENOL) 325 MG tablet, Take 650 mg by mouth every 6 (six) hours as needed for headache (pain)., Disp: , Rfl:    albuterol (VENTOLIN HFA) 108 (90 Base) MCG/ACT inhaler, Inhale 2 puffs into the lungs every 4 (four) hours as needed for wheezing or shortness of breath., Disp: , Rfl:    ALPRAZolam (XANAX) 0.25 MG tablet, Take 1 tablet (0.25 mg total) by mouth 2 (two) times daily as needed for anxiety., Disp: 30 tablet, Rfl: 0   amLODipine (NORVASC) 10 MG tablet, Take 1 tablet (10 mg total) by mouth daily., Disp: 30 tablet, Rfl: 0   aspirin EC 81 MG EC tablet, Take 1 tablet (81 mg total) by mouth daily., Disp: 30 tablet, Rfl: 0   atorvastatin (LIPITOR) 80 MG tablet, Take 80 mg by mouth at bedtime., Disp: , Rfl:    busPIRone (BUSPAR) 5 MG tablet, Take 5 mg by mouth every morning., Disp: , Rfl:    clonazePAM (KLONOPIN) 0.25 MG disintegrating tablet, Take 1 tablet (0.25 mg total) by mouth 2 (two) times daily., Disp: 60 tablet, Rfl: 0   cloNIDine (CATAPRES) 0.3 MG tablet, Take 1 tablet (0.3 mg total) by mouth 2 (two) times daily., Disp: 60 tablet, Rfl: 0   clopidogrel (PLAVIX) 75 MG tablet, Take 75 mg by mouth daily., Disp: , Rfl:    FLUoxetine (PROZAC) 40 MG capsule, Take 40 mg by mouth every morning., Disp: , Rfl:    folic acid (FOLVITE) 1 MG tablet, Take 1 tablet (1 mg total) by mouth daily., Disp: 30  tablet, Rfl: 0   furosemide (LASIX) 40 MG tablet, Take 2 tablets (80 mg total) by mouth daily., Disp: 60 tablet, Rfl: 11   hydrALAZINE (APRESOLINE) 100 MG tablet, Take 100 mg by mouth every 8 (eight) hours., Disp: , Rfl:    insulin aspart (NOVOLOG) 100 UNIT/ML injection, Inject 0-9 Units into the skin 3 (three) times daily with meals., Disp: 10 mL, Rfl: 11   insulin glargine (LANTUS) 100 UNIT/ML injection, Inject 0.2 mLs (20 Units total) into the skin daily., Disp: 10 mL, Rfl: 11   ipratropium-albuterol (DUONEB) 0.5-2.5 (3) MG/3ML SOLN, Take 3 mLs by nebulization every 4 (four) hours as needed., Disp: 360 mL, Rfl: 0   isosorbide mononitrate (IMDUR) 60 MG 24 hr tablet, Take 1.5 tablets (90 mg total) by mouth daily., Disp: 45 tablet, Rfl: 0   labetalol (NORMODYNE) 300 MG tablet, Take 300 mg by mouth every 12 (twelve) hours., Disp: , Rfl:    Multiple Vitamin (MULTIVITAMIN WITH MINERALS) TABS tablet, Take 1 tablet by mouth daily., Disp: 30 tablet, Rfl: 0  nitroGLYCERIN (NITROSTAT) 0.4 MG SL tablet, Place 0.4 mg under the tongue every 5 (five) minutes as needed for chest pain., Disp: , Rfl:    pantoprazole (PROTONIX) 40 MG tablet, Take 40 mg by mouth daily., Disp: , Rfl:    polyethylene glycol (MIRALAX / GLYCOLAX) 17 g packet, Take 17 g by mouth daily as needed (constipation)., Disp: , Rfl:    thiamine 100 MG tablet, Take 1 tablet (100 mg total) by mouth daily., Disp: 30 tablet, Rfl: 0   Garner Nash, DO [facility] 11/14/2019 12:32 PM

## 2019-11-14 NOTE — Telephone Encounter (Signed)
Patient referred to Clinical Exercise Physiologist by Darrick Grinder, NP for guidance and discussion about safe home exercises and/or starting an exercise program. Exercise handouts and information packet were provided during her visit and I followed up with her telephonically with discussion surrounding exercises and safety precautions to patient.  All patient's questions were answered and patient was given contact information for further questions or concerns regarding their exercise.   Niyanna was very pleased with the call and guidance. She requested I email the link for live Zoom exercises to her daughter for them to both try, as Anitra does not have access to a computer/email and will rely on her daughter to assist with that.    Landis Martins, MS, ACSM-RCEP Clinical Exercise Physiologist/ Health and Wellness Coach

## 2019-11-22 ENCOUNTER — Encounter (HOSPITAL_COMMUNITY): Payer: Medicaid Other

## 2019-11-26 ENCOUNTER — Other Ambulatory Visit: Payer: Self-pay

## 2019-11-26 ENCOUNTER — Ambulatory Visit (HOSPITAL_COMMUNITY): Admission: RE | Admit: 2019-11-26 | Payer: Medicaid Other | Source: Ambulatory Visit

## 2019-11-27 ENCOUNTER — Telehealth (HOSPITAL_COMMUNITY): Payer: Self-pay | Admitting: Cardiology

## 2019-11-27 MED ORDER — FUROSEMIDE 40 MG PO TABS: 80.0000 mg | ORAL_TABLET | Freq: Two times a day (BID) | ORAL | 11 refills | Status: DC

## 2019-11-27 NOTE — Telephone Encounter (Signed)
Abnormal Cardio [place] reading  Reading reviewed by Amy Clegg,NP Reading-28  Per VO Amy CLegg,NP Increase lasix to 80 mg BID  Orders faxed to [facility] at [phone] (fax)

## 2019-11-28 ENCOUNTER — Telehealth (HOSPITAL_COMMUNITY): Payer: Self-pay | Admitting: Vascular Surgery

## 2019-11-28 NOTE — Telephone Encounter (Signed)
Left nursing home message to reschedule 6/28 appt w/ [facility] , due to [place] being on Vacation

## 2019-12-06 ENCOUNTER — Encounter (HOSPITAL_COMMUNITY): Payer: Medicaid Other

## 2019-12-10 ENCOUNTER — Encounter (HOSPITAL_COMMUNITY): Payer: Medicaid Other

## 2019-12-13 ENCOUNTER — Ambulatory Visit (HOSPITAL_COMMUNITY)
Admission: RE | Admit: 2019-12-13 | Discharge: 2019-12-13 | Disposition: A | Discharge status: Home / Self Care | Payer: Medicaid Other | Source: Ambulatory Visit | Attending: Adult Health | Admitting: Adult Health

## 2019-12-13 ENCOUNTER — Other Ambulatory Visit: Payer: Self-pay

## 2019-12-13 VITALS — BP 152/70 | HR 79 | Wt 158.6 lb

## 2019-12-13 DIAGNOSIS — R0600 Dyspnea, unspecified: Secondary | ICD-10-CM

## 2019-12-13 DIAGNOSIS — R140 Abdominal distension (gaseous): Secondary | ICD-10-CM | POA: Insufficient documentation

## 2019-12-13 DIAGNOSIS — I701 Atherosclerosis of renal artery: Secondary | ICD-10-CM | POA: Diagnosis not present

## 2019-12-13 DIAGNOSIS — Z7902 Long term (current) use of antithrombotics/antiplatelets: Secondary | ICD-10-CM | POA: Diagnosis not present

## 2019-12-13 DIAGNOSIS — I2510 Atherosclerotic heart disease of native coronary artery without angina pectoris: Secondary | ICD-10-CM

## 2019-12-13 DIAGNOSIS — Z79899 Other long term (current) drug therapy: Secondary | ICD-10-CM | POA: Diagnosis not present

## 2019-12-13 DIAGNOSIS — I110 Hypertensive heart disease with heart failure: Secondary | ICD-10-CM | POA: Diagnosis not present

## 2019-12-13 DIAGNOSIS — F329 Major depressive disorder, single episode, unspecified: Secondary | ICD-10-CM | POA: Insufficient documentation

## 2019-12-13 DIAGNOSIS — K219 Gastro-esophageal reflux disease without esophagitis: Secondary | ICD-10-CM | POA: Insufficient documentation

## 2019-12-13 DIAGNOSIS — I10 Essential (primary) hypertension: Secondary | ICD-10-CM | POA: Diagnosis not present

## 2019-12-13 DIAGNOSIS — Z7982 Long term (current) use of aspirin: Secondary | ICD-10-CM | POA: Insufficient documentation

## 2019-12-13 DIAGNOSIS — R600 Localized edema: Secondary | ICD-10-CM

## 2019-12-13 DIAGNOSIS — J9611 Chronic respiratory failure with hypoxia: Secondary | ICD-10-CM | POA: Insufficient documentation

## 2019-12-13 DIAGNOSIS — Z951 Presence of aortocoronary bypass graft: Secondary | ICD-10-CM | POA: Diagnosis not present

## 2019-12-13 DIAGNOSIS — J440 Chronic obstructive pulmonary disease with acute lower respiratory infection: Secondary | ICD-10-CM | POA: Diagnosis not present

## 2019-12-13 DIAGNOSIS — K5900 Constipation, unspecified: Secondary | ICD-10-CM | POA: Insufficient documentation

## 2019-12-13 DIAGNOSIS — J90 Pleural effusion, not elsewhere classified: Secondary | ICD-10-CM | POA: Insufficient documentation

## 2019-12-13 DIAGNOSIS — R109 Unspecified abdominal pain: Secondary | ICD-10-CM | POA: Insufficient documentation

## 2019-12-13 DIAGNOSIS — Z89511 Acquired absence of right leg below knee: Secondary | ICD-10-CM | POA: Diagnosis not present

## 2019-12-13 DIAGNOSIS — Z8249 Family history of ischemic heart disease and other diseases of the circulatory system: Secondary | ICD-10-CM | POA: Insufficient documentation

## 2019-12-13 DIAGNOSIS — Z8673 Personal history of transient ischemic attack (TIA), and cerebral infarction without residual deficits: Secondary | ICD-10-CM | POA: Insufficient documentation

## 2019-12-13 DIAGNOSIS — E1151 Type 2 diabetes mellitus with diabetic peripheral angiopathy without gangrene: Secondary | ICD-10-CM | POA: Insufficient documentation

## 2019-12-13 DIAGNOSIS — I5032 Chronic diastolic (congestive) heart failure: Secondary | ICD-10-CM

## 2019-12-13 DIAGNOSIS — Z794 Long term (current) use of insulin: Secondary | ICD-10-CM | POA: Diagnosis not present

## 2019-12-13 DIAGNOSIS — N179 Acute kidney failure, unspecified: Secondary | ICD-10-CM | POA: Diagnosis not present

## 2019-12-13 DIAGNOSIS — E7800 Pure hypercholesterolemia, unspecified: Secondary | ICD-10-CM | POA: Diagnosis not present

## 2019-12-13 DIAGNOSIS — R0609 Other forms of dyspnea: Secondary | ICD-10-CM

## 2019-12-13 LAB — BASIC METABOLIC PANEL
Anion gap: 10 (ref 5–15)
BUN: 17 mg/dL (ref 6–20)
CO2: 26 mmol/L (ref 22–32)
Calcium: 8.8 mg/dL — ABNORMAL LOW (ref 8.9–10.3)
Chloride: 99 mmol/L (ref 98–111)
Creatinine, Ser: 1.06 mg/dL — ABNORMAL HIGH (ref 0.44–1.00)
GFR calc Af Amer: >60 mL/min (ref >60)
GFR calc non Af Amer: 57 mL/min — ABNORMAL LOW (ref >60)
Glucose, Bld: 383 mg/dL — ABNORMAL HIGH (ref 70–99)
Potassium: 4.3 mmol/L (ref 3.5–5.1)
Sodium: 135 mmol/L (ref 135–145)

## 2019-12-13 LAB — BRAIN NATRIURETIC PEPTIDE: B Natriuretic Peptide: 1253.1 pg/mL — ABNORMAL HIGH (ref 0.0–100.0)

## 2019-12-13 MED ORDER — METOLAZONE 2.5 MG PO TABS: 2.5000 mg | ORAL_TABLET | ORAL | 3 refills | Status: DC

## 2019-12-13 MED ORDER — TORSEMIDE 20 MG PO TABS: 60.0000 mg | ORAL_TABLET | Freq: Two times a day (BID) | ORAL | 3 refills | Status: DC

## 2019-12-13 NOTE — Patient Instructions (Signed)
STOP Lasix  START Torsemide 60mg  (3 tabs) twice a day  START Metolazone 2.5mg  (1 tab) weekly on Fridays  Labs today We will only contact you if something comes back abnormal or we need to make some changes. Otherwise no news is good news!  Your physician recommends that you schedule a follow-up appointment in: 1 week and in 2 weeks with the Nurse Practitioner/Physicians Assistant.  Next appointment:  Thursday, April 29th 2021 at 3pm  Thursday, May 6th, 2021 at 1:30pm   Please call office at [phone] option 2 if you have any questions or concerns.   At the [facility], you and your health needs are our priority. As part of our continuing mission to provide you with exceptional heart care, we have created designated Provider Care Teams. These Care Teams include your primary Cardiologist (physician) and Advanced Practice Providers (APPs- Physician Assistants and Nurse Practitioners) who all work together to provide you with the care you need, when you need it.   You may see any of the following providers on your designated Care Team at your next follow up: Marland Kitchen Dr Glori Bickers . Dr Loralie Champagne . Darrick Grinder, NP . Lyda Jester, PA . Audry Riles, PharmD   Please be sure to bring in all your medications bottles to every appointment.

## 2019-12-13 NOTE — Progress Notes (Signed)
Heart and Vascular Care Navigation  12/13/2019  Jamie [name] [birth year] [id number]  Reason for Referral: CSW consulted to discuss care concerns at SNF and pt to desire to return home- wanting to see what options might be available to help support her at home.                                                                                                    Assessment:   Pt was living at home with her spouse but requiring high level of care and had multiple medical concerns- primarily her heart failure and her shortness of breath as a result.  Due to this shortness of breath she was unable to complete ADLs independently and her spouse was not physically able to help her due to her weakness and BKA from last year.       Family starting thinking about SNF placement because of frequent hospital visits and medical needs but pt had no insurance at that time.  They were able to apply for and be awarded disability ($1,500-1,600/month) and high deductible Medicaid which enabled her to enter into a SNF.  Once in the SNF they started trying to get LTC Medicaid and case is still pending Jamie [name] is the [facility] case worker for her LTC case).  Per pt daughter in law, Jamie [name], SNF has not been very helpful with her care and it is often hard to get a hold of medical staff to discuss concerns or get pt the equipment she needs.  Pt also has become very depressed during SNF stay as she is young and feels as if she has no one to connect to.  For these reasons pt and family wishing to consider pt returning home and wanting to know more about home options.  CSW discussed possible PCS referral to get pt a home aid but explained this would be dependent on pt continuing to have Medicaid.  Verbal permission provided to discuss pt Medicaid case with the case worker who helped them get it, Jamie [name] [phone].  CSW explained that because pt getting $1,500-1,600/month I didn't think she would qualify for [facility] so she would only be able to get PCS services as long as the high deductible Medicaid lasts.  Pt reports that on a good day when she is not short of breath she can complete ADLs independently but that on bad days she needs assistance with everything.  Acknowledges that future planning might be dependent on how her breathing and health improves with medication changes being administered by HF clinic.    CSW also discussed possibly looking into alternative SNF options for LTC if pt does not improve enough to return home considering how unhappy she is at current location.  Provided list for family to review and consider putting pt on waitlist for SNF.                         HRT/VAS Care Coordination    Patients Home Cardiology Office  Heart Failure Clinic   Outpatient Care Team  Social Worker   Social Worker Name:  Tammy Sours- [phone]   Living arrangements for the past 2 months  [facility]   Lives with:  Spouse prior to SNF stay was living with spouse   Patient Current Insurance Coverage  Medicaid currently high deductible Medicaid- expires 9/21 unless extended by COVID   Does Patient Have Prescription Coverage?  Yes but not permanent will only be until 9/21   [place]; Wheelchair      Social History:                                                                             SDOH Screenings   Alcohol Screen:   . Last Alcohol Screening Score (AUDIT):   Depression (PHQ2-9):   . PHQ-2 Score:   Financial Resource Strain:   . Difficulty of Paying Living Expenses:   Food Insecurity:   . Worried About Charity fundraiser in the Last Year:   . [employer] of Food in the Last Year:   Housing: [place]   . Last Housing Risk Score: 0  Physical Activity: Inactive  . Days of Exercise per Week: 0 days  . Minutes of Exercise per Session: 0 min  Social Connections:   . Frequency of Communication with Friends and  Family:   . Frequency of Social Gatherings with Friends and Family:   . Attends Religious Services:   . Active Member of Clubs or Organizations:   . Attends Archivist Meetings:   Marland Kitchen Marital Status:   Stress:   . Feeling of Stress :   Tobacco Use: Low Risk   . Smoking Tobacco Use: Never Smoker  . Smokeless Tobacco Use: Never Used  Transportation Needs: No Transportation Needs  . Lack of Transportation (Medical): No  . Lack of Transportation (Non-Medical): No     Other Care Navigation Interventions:     Patient expressed Mental Health concerns Yes, Referred to:  currently working with SNF staff on depression- receiving medications for depression   Follow-up plan:  CSW to call pt Medicaid worker to discuss what pt is eligible for and get back with pt  Jorge Ny, [facility] Desk#: [phone] Cell#: [phone]

## 2019-12-13 NOTE — Progress Notes (Signed)
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 Also had cardiomems placed . She was not discharged on lasix. Discharged back to SNF on 11/06/19.   Today she returns for HF follow up with her daughter in law, [place]. Last visit lasix 80 mg was started. A few weeks later lasix was increased to 80 mg twice a day. Overall feeling fair. Complaining of abdominal bloating and constipation. Complaining of fatigue. Frustrated about her care at the nursing facility. Remains on 4 liters [place]. SOB with exertion. Denies PND/Orthopnea. Appetite ok. Having leg edema. No fever or chills. Weight at SNF-->154-157pounds. Taking all medications. Still living at [facility],  [facility] [phone] Fax [phone].   Cardiac Studies Echo 10/01/19 with EF 55-60% with restrictive LV filling pattern consistent with elevated LA pressure and borderline concentric LVH  10/20/19 Echo 10/20/19 with 55-60% with mild concentric LVH, both mean arterial pressure and LVEDP are elevated.  Mild MR and Mild TR trace PR RV systolic pressure is 48 mmHg     10/31/19 RHC/LHC + Cardiomems placement. Coronary Findings Diagnostic Dominance: Right Left Main  Mild 20% distal narrowing.  Left Anterior Descending  30% stenosis proximal LAD prior to  D1. 50% stenosis LAD at D1. 80-90% stenosis mid LAD. 75% proximal D1 stenosis, moderate-large vessel.  Left Circumflex  Long 70% stenosis ostial to proximal LCx. Tiny OM1 with diffuse severe disease. Small OM2 with 99% proximal stenosis. 60% stenosis proximal PLOM.  Right Coronary Artery  Long area of calcified plaque from the proximal into the mid RCA. 90% stenosis proximally, 80% stenosis in the mid RCA. 80% stenosis mid PDA. 70% stenosis mid PLV.  Intervention  No interventions have been documented. Right Heart  Right Heart Pressures RHC Procedural Findings: Hemodynamics (mmHg) RA mean 2 RV 29/4 PA 27/10, mean 16 PCWP mean 13 LV 116/8 AO 113/53  Oxygen saturations: PA 69% AO 100%  Cardiac Output (Fick) 6.2  Cardiac Index (Fick) 3.63   PCI (3/12): Atherectomy + DES to prox/mid RCA, DES mid LAD.      ROS: All systems negative except as listed in HPI, PMH and Problem List.  SH:  Social History   Socioeconomic History  . Marital status: Unknown    Spouse name: Not on file  . Number of children: Not on file  . Years of education: Not on file  . Highest education level: Not on file  Occupational History  . Not on file  Tobacco Use  . Smoking status: Never Smoker  . Smokeless tobacco: Never Used  Substance and Sexual Activity  . Alcohol use: Never  . Drug use: Never  . Sexual activity: Not on file  Other Topics Concern  . Not on file  Social History Narrative  . Not on file   Social Determinants of Health   Financial Resource Strain:   . Difficulty  of Paying Living Expenses:   Food Insecurity:   . Worried About Charity fundraiser in the Last Year:   . Arboriculturist in the Last Year:   Transportation Needs: No Transportation Needs  . Lack of Transportation (Medical): No  . Lack of Transportation (Non-Medical): No  Physical Activity: Inactive  . Days of Exercise per Week: 0 days  . Minutes of Exercise per Session: 0 min  Stress:   . Feeling of  Stress :   Social Connections:   . Frequency of Communication with Friends and Family:   . Frequency of Social Gatherings with Friends and Family:   . Attends Religious Services:   . Active Member of Clubs or Organizations:   . Attends Archivist Meetings:   Marland Kitchen Marital Status:   Intimate Partner Violence:   . Fear of Current or Ex-Partner:   . Emotionally Abused:   Marland Kitchen Physically Abused:   . Sexually Abused:     FH:  Family History  Problem Relation Age of Onset  . Hypertension Brother   . Diabetes Brother   . Hypertension Maternal Grandmother   . Heart attack Paternal Grandmother     Past Medical History:  Diagnosis Date  . Amputation below knee ([employer]) RT   . Chronic diastolic HF (heart failure) ([place]) 10/21/2019  . COPD (chronic obstructive pulmonary disease) ([place])   . Diabetes type 2, uncontrolled ([place])   . Gastritis and duodenitis   . HTN (hypertension) 10/21/2019  . PAD (peripheral artery disease) ([place]) 10/21/2019  . PAD (peripheral artery disease) ([place])   . PNA (pneumonia)   . TIA (transient ischemic attack)     Current Outpatient Medications  Medication Sig Dispense Refill  . acetaminophen (TYLENOL) 325 MG tablet Take 650 mg by mouth every 6 (six) hours as needed for headache (pain).    Marland Kitchen albuterol (VENTOLIN HFA) 108 (90 Base) MCG/ACT inhaler Inhale 2 puffs into the lungs every 4 (four) hours as needed for wheezing or shortness of breath.    . ALPRAZolam (XANAX) 0.25 MG tablet Take 1 tablet (0.25 mg total) by mouth 2 (two) times daily as needed for anxiety. 30 tablet 0  . amLODipine (NORVASC) 10 MG tablet Take 1 tablet (10 mg total) by mouth daily. 30 tablet 0  . aspirin EC 81 MG EC tablet Take 1 tablet (81 mg total) by mouth daily. 30 tablet 0  . atorvastatin (LIPITOR) 80 MG tablet Take 80 mg by mouth at bedtime.    . busPIRone (BUSPAR) 5 MG tablet Take 5 mg by mouth every morning.    . clonazePAM (KLONOPIN) 0.25 MG disintegrating tablet Take 1 tablet (0.25 mg  total) by mouth 2 (two) times daily. 60 tablet 0  . cloNIDine (CATAPRES) 0.3 MG tablet Take 1 tablet (0.3 mg total) by mouth 2 (two) times daily. 60 tablet 0  . clopidogrel (PLAVIX) 75 MG tablet Take 75 mg by mouth daily.    Marland Kitchen FLUoxetine (PROZAC) 40 MG capsule Take 40 mg by mouth every morning.    . folic acid (FOLVITE) 1 MG tablet Take 1 tablet (1 mg total) by mouth daily. 30 tablet 0  . furosemide (LASIX) 40 MG tablet Take 2 tablets (80 mg total) by mouth 2 (two) times daily. 120 tablet 11  . hydrALAZINE (APRESOLINE) 100 MG tablet Take 100 mg by mouth every 8 (eight) hours.    . insulin aspart (NOVOLOG) 100 UNIT/ML injection Inject 0-9 Units into the skin 3 (three) times  daily with meals. 10 mL 11  . insulin glargine (LANTUS) 100 UNIT/ML injection Inject 0.2 mLs (20 Units total) into the skin daily. 10 mL 11  . ipratropium-albuterol (DUONEB) 0.5-2.5 (3) MG/3ML SOLN Take 3 mLs by nebulization every 4 (four) hours as needed. 360 mL 0  . isosorbide mononitrate (IMDUR) 60 MG 24 hr tablet Take 1.5 tablets (90 mg total) by mouth daily. 45 tablet 0  . labetalol (NORMODYNE) 300 MG tablet Take 300 mg by mouth every 12 (twelve) hours.    . Multiple Vitamin (MULTIVITAMIN WITH MINERALS) TABS tablet Take 1 tablet by mouth daily. 30 tablet 0  . nitroGLYCERIN (NITROSTAT) 0.4 MG SL tablet Place 0.4 mg under the tongue every 5 (five) minutes as needed for chest pain.    . pantoprazole (PROTONIX) 40 MG tablet Take 40 mg by mouth daily.    . polyethylene glycol (MIRALAX / GLYCOLAX) 17 g packet Take 17 g by mouth daily as needed (constipation).    . potassium chloride (KLOR-CON) 10 MEQ tablet Take 10 mEq by mouth daily.    Marland Kitchen thiamine 100 MG tablet Take 1 tablet (100 mg total) by mouth daily. 30 tablet 0  . venlafaxine (EFFEXOR) 37.5 MG tablet Take 37.5 mg by mouth daily.     No current facility-administered medications for this encounter.    Vitals:   12/13/19 1344  BP: (!) 152/70  Pulse: 79  SpO2: 98%    Weight: 71.9 kg (158 lb 9.6 oz)   Wt Readings from Last 3 Encounters:  12/13/19 71.9 kg (158 lb 9.6 oz)  11/14/19 67.6 kg (149 lb)  11/13/19 67 kg (147 lb 12.8 oz)    PHYSICAL EXAM: General:  Tearful. Sitting in the wheel chair. No resp difficulty HEENT: normal Neck: supple. JVP 11-12  Carotids 2+ bilat; no bruits. No lymphadenopathy or thryomegaly appreciated. Cor: PMI nondisplaced. Regular rate & rhythm. No rubs, gallops or murmurs. Lungs: Decreased on the left on 4 liters [place] Abdomen: soft, nontender, nondistended. No hepatosplenomegaly. No bruits or masses. Good bowel sounds. Extremities: no cyanosis, clubbing, rash, RBKA LLE 1-2+edema Neuro: alert & orientedx3, cranial nerves grossly intact. moves all 4 extremities w/o difficulty. Affect pleasant   ASSESSMENT & PLAN: 1. Chronic hypoxemic respiratory failure: 09/2019 COVID-19 viral pneumonia + CHF exacerbation. - Completed steroids and remdesivir for COVID-19.  -  Sats stable on 4 liters [place].     2. Chronic diastolic CHF: Multiple admissions over the last 6 months with hypertensive urgency/diastolic CHF.  Echo at [facility] 2/21 with EF 55-60%, restrictive diastolic function. Echo here with EF 55-60%, normal RV, IVC suggesting RA pressure 8 mmHg. RHC 3/10 showed optimized filling pressures.  With multiple recent diastolic CHF admissions, we placed a Cardiomems device.  Cardiomems reading on the day of discharge was 12.  Todays reading is up to 22. Plan to continue daily transmissions.  - Stop lasix. Start torsemide 60 mg twice a day and start 2.5 mg metolazone every Friday.  - Check BMET/BNP   3. Pleural effusions: 09/2019 Had thoracenteses at [facility] and again bilaterally here. Transudates with negative cytology. Suspect due to CHF.    4. HTN:  - Elevated today. Hopefully will improve with better diuresis - Continue amlodipine 10 mg daily.  - Continue hydralazine 100 mg tid. - Continue labetalol to 300 mg bid.  - Continue  clonidine 0.3 bid.   - Continue spironolactone 25 mg daily.  - Hold off on ACEI/ARB with AKI.  - Vascular renal artery dopplers--> .  1-59% stenosis of the right renal artery. Abnormal right  Left: 1-59% stenosis of the left renal artery. Abnormal  5. AKI:  Resolved.   6. Abdominal pain: CT abdomen earlier in admission with 1.5 cm liver lesion but no other significant abdominal abnormality. She has Abdomen 3/1 showed no acute abdominal findings.  Add colace 100 mg twice a day.   7. COPD: Per report on 4L home oxygen  8. DM: Per SNF -Hgb A1C 6.9 on 10/23/19.  - Consider SGLT2i.   9. CAD: Coronary angiography today showed severe, calcified 3 vessel disease.  This likely plays a role in her multiple episodes of flash pulmonary edema.  Ideally, based on this anatomy she would have CABG.  However, she has had right BKA and does not have a prosthesis.  She is currently non-ambulatory.  She would not be able to rehab effectively after CABG.   Now s/p atherectomy/PCI to prox/mid LAD and PCI mid LAD. For now, no intervention on LCx disease. HsTnI checked last night and elevated but repeat trended down.  ?if this was post-PCI.   -No chest pain. Continue current dose of imdur.  -Continue atorvastatin 80 mg daily. Continue Crestor, Plavix.   Follow up weekly x 2 weeks. Referred to HFSW to help with disposition plan when she is discharged. She would like to go home but we need to get HF stabilized.   Greater than 50% of the (total minutes 40) visit spent in counseling/coordination of care regarding medications and disposition planning.  Merari Pion NP-C  1:54 PM

## 2019-12-14 ENCOUNTER — Telehealth (HOSPITAL_COMMUNITY): Payer: Self-pay | Admitting: Licensed Clinical Social Worker

## 2019-12-14 NOTE — Telephone Encounter (Signed)
CSW was able to speak with pt Medicaid worker who confirms that pt has high deductible Medicaid and it expires in Sept of this year- pt would need to meet another deductible to qualify for Medicaid again.  Pt currently with pending LTC Medicaid but unable to speak with that case worker at this time- left VM and awaiting return call.  CSW called pt daughter in law to discuss possible options for pt.  Discussed pt transferring to another SNF or ALF once she gets LTC Medicaid allowing family to take more time to look at facility options and hopefully find somewhere pt would be more comfortable.  Discussed option to go home with PCS services until her high deductible Medicaid runs out in September while they try to establish plan for when her high deductible Medicaid plan runs out.  Because pt now has disability she could also potentially afford in home aids paying privately- could likely get about 10 hours a week paying the same amount out of her disability as her husband is currently having to pay to the SNF to keep her there.  Unfortunately pt would not have insurance after September however so would then have to worry about medical bills and medication cost.  CSW suggest they look at [facility] website to see how much insurance would cost them at this time to help with MD visits and medications.  CSW will continue to follow and assist in looking into other options with pt and pt family  Jorge Ny, [facility] Desk#: [phone] Cell#: [phone]

## 2019-12-20 ENCOUNTER — Encounter (HOSPITAL_COMMUNITY): Payer: Self-pay

## 2019-12-20 ENCOUNTER — Ambulatory Visit (HOSPITAL_COMMUNITY)
Admission: RE | Admit: 2019-12-20 | Discharge: 2019-12-20 | Disposition: A | Discharge status: Home / Self Care | Payer: Medicaid Other | Source: Ambulatory Visit | Attending: Cardiology | Admitting: Cardiology

## 2019-12-20 ENCOUNTER — Other Ambulatory Visit: Payer: Self-pay

## 2019-12-20 VITALS — BP 134/62 | HR 80

## 2019-12-20 DIAGNOSIS — Z8719 Personal history of other diseases of the digestive system: Secondary | ICD-10-CM | POA: Insufficient documentation

## 2019-12-20 DIAGNOSIS — Z79899 Other long term (current) drug therapy: Secondary | ICD-10-CM | POA: Diagnosis not present

## 2019-12-20 DIAGNOSIS — Z8616 Personal history of COVID-19: Secondary | ICD-10-CM | POA: Diagnosis not present

## 2019-12-20 DIAGNOSIS — Z7902 Long term (current) use of antithrombotics/antiplatelets: Secondary | ICD-10-CM | POA: Diagnosis not present

## 2019-12-20 DIAGNOSIS — J90 Pleural effusion, not elsewhere classified: Secondary | ICD-10-CM | POA: Insufficient documentation

## 2019-12-20 DIAGNOSIS — K219 Gastro-esophageal reflux disease without esophagitis: Secondary | ICD-10-CM | POA: Diagnosis not present

## 2019-12-20 DIAGNOSIS — J9611 Chronic respiratory failure with hypoxia: Secondary | ICD-10-CM | POA: Insufficient documentation

## 2019-12-20 DIAGNOSIS — Z794 Long term (current) use of insulin: Secondary | ICD-10-CM | POA: Insufficient documentation

## 2019-12-20 DIAGNOSIS — Z89511 Acquired absence of right leg below knee: Secondary | ICD-10-CM | POA: Diagnosis not present

## 2019-12-20 DIAGNOSIS — I5032 Chronic diastolic (congestive) heart failure: Secondary | ICD-10-CM | POA: Insufficient documentation

## 2019-12-20 DIAGNOSIS — Z833 Family history of diabetes mellitus: Secondary | ICD-10-CM | POA: Insufficient documentation

## 2019-12-20 DIAGNOSIS — Z8249 Family history of ischemic heart disease and other diseases of the circulatory system: Secondary | ICD-10-CM | POA: Insufficient documentation

## 2019-12-20 DIAGNOSIS — F419 Anxiety disorder, unspecified: Secondary | ICD-10-CM | POA: Diagnosis not present

## 2019-12-20 DIAGNOSIS — I110 Hypertensive heart disease with heart failure: Secondary | ICD-10-CM | POA: Insufficient documentation

## 2019-12-20 DIAGNOSIS — F329 Major depressive disorder, single episode, unspecified: Secondary | ICD-10-CM | POA: Insufficient documentation

## 2019-12-20 DIAGNOSIS — E7800 Pure hypercholesterolemia, unspecified: Secondary | ICD-10-CM | POA: Diagnosis not present

## 2019-12-20 DIAGNOSIS — E1151 Type 2 diabetes mellitus with diabetic peripheral angiopathy without gangrene: Secondary | ICD-10-CM | POA: Insufficient documentation

## 2019-12-20 DIAGNOSIS — I2510 Atherosclerotic heart disease of native coronary artery without angina pectoris: Secondary | ICD-10-CM | POA: Diagnosis not present

## 2019-12-20 DIAGNOSIS — Z9981 Dependence on supplemental oxygen: Secondary | ICD-10-CM | POA: Insufficient documentation

## 2019-12-20 DIAGNOSIS — J449 Chronic obstructive pulmonary disease, unspecified: Secondary | ICD-10-CM | POA: Diagnosis not present

## 2019-12-20 DIAGNOSIS — Z8673 Personal history of transient ischemic attack (TIA), and cerebral infarction without residual deficits: Secondary | ICD-10-CM | POA: Diagnosis not present

## 2019-12-20 DIAGNOSIS — Z7982 Long term (current) use of aspirin: Secondary | ICD-10-CM | POA: Insufficient documentation

## 2019-12-20 LAB — CBC
HCT: 29.8 % — ABNORMAL LOW (ref 36.0–46.0)
Hemoglobin: 9 g/dL — ABNORMAL LOW (ref 12.0–15.0)
MCH: 24.7 pg — ABNORMAL LOW (ref 26.0–34.0)
MCHC: 30.2 g/dL (ref 30.0–36.0)
MCV: 81.9 fL (ref 80.0–100.0)
Platelets: 248 10*3/uL (ref 150–400)
RBC: 3.64 MIL/uL — ABNORMAL LOW (ref 3.87–5.11)
RDW: 14.6 % (ref 11.5–15.5)
WBC: 7.5 10*3/uL (ref 4.0–10.5)
nRBC: 0 % (ref 0.0–0.2)

## 2019-12-20 LAB — BASIC METABOLIC PANEL
Anion gap: 10 (ref 5–15)
BUN: 40 mg/dL — ABNORMAL HIGH (ref 6–20)
CO2: 30 mmol/L (ref 22–32)
Calcium: 9 mg/dL (ref 8.9–10.3)
Chloride: 96 mmol/L — ABNORMAL LOW (ref 98–111)
Creatinine, Ser: 1.31 mg/dL — ABNORMAL HIGH (ref 0.44–1.00)
GFR calc Af Amer: 52 mL/min — ABNORMAL LOW (ref >60)
GFR calc non Af Amer: 44 mL/min — ABNORMAL LOW (ref >60)
Glucose, Bld: 299 mg/dL — ABNORMAL HIGH (ref 70–99)
Potassium: 4.1 mmol/L (ref 3.5–5.1)
Sodium: 136 mmol/L (ref 135–145)

## 2019-12-20 NOTE — Progress Notes (Signed)
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   Recent hospitalization at [facility] for acute on chronic respiratory failure, Bilateral pl effusion and acute diastolic HF X 2 (7/67/20-04/26/69 and 10/01/19-10/06/19)  + thoracentesis 10/04/19 of 1 L   (over 10 months pt has had 5 thoracentesis)  Admitted 10/21/19 and prolonged hospitalization due to COVID, respiratory failure, A/C diastolic HF and CAD with  PCI to LAD. Also had cardiomems placed . She was not discharged on lasix. Discharged back to SNF on 11/06/19.   Had f/u last week and fluid status was up. PA diastolic pressure was elevated at 22 (goal is 12). She noted poor response to Lasix. Lasix was stopped and she was transitioned to torsemide + once weekly metolazone q Fridays. She remains at [facility] but hoping to be able to transition back home soon.  She returns to clinic today w/ her daughter-in law. Volume status much improved. PA diastolic pressure improved and at goal at 11. She states she feels better from a HF standpoint, but looks depressed and not very talkative today. Daughter-in law doing most of the talking. Her BP is stable. She has no cardiac complaints today.    Cardiac Studies Echo 10/01/19 with EF 55-60% with restrictive LV filling pattern consistent with elevated LA pressure and borderline concentric LVH  10/20/19 Echo 10/20/19 with 55-60% with mild concentric LVH, both mean arterial pressure and LVEDP are elevated.  Mild MR and Mild TR trace PR RV systolic pressure is 48 mmHg     10/31/19 RHC/LHC + Cardiomems placement. Coronary  Findings Diagnostic Dominance: Right Left Main  Mild 20% distal narrowing.  Left Anterior Descending  30% stenosis proximal LAD prior to D1. 50% stenosis LAD at D1. 80-90% stenosis mid LAD. 75% proximal D1 stenosis, moderate-large vessel.  Left Circumflex  Long 70% stenosis ostial to proximal LCx. Tiny OM1 with diffuse severe disease. Small OM2 with 99% proximal stenosis. 60% stenosis proximal PLOM.  Right Coronary Artery  Long area of calcified plaque from the proximal into the mid RCA. 90% stenosis proximally, 80% stenosis in the mid RCA. 80% stenosis mid PDA. 70% stenosis mid PLV.  Intervention  No interventions have been documented. Right Heart  Right Heart Pressures RHC Procedural Findings: Hemodynamics (mmHg) RA mean 2 RV 29/4 PA 27/10, mean 16 PCWP mean 13 LV 116/8 AO 113/53  Oxygen saturations: PA 69% AO 100%  Cardiac Output (Fick) 6.2  Cardiac Index (Fick) 3.63   PCI (3/12): Atherectomy + DES to prox/mid RCA, DES mid LAD.      ROS: All systems negative except as listed in HPI, PMH and Problem List.  SH:  Social History   Socioeconomic History  . Marital status: Unknown    Spouse name: Not on file  . Number of children: Not on file  . Years of education: Not on file  . Highest education level: Not on file  Occupational History  . Not on file  Tobacco Use  . Smoking status: Never Smoker  . Smokeless tobacco: Never Used  Substance and Sexual Activity  . Alcohol use: Never  . Drug use: Never  . Sexual activity: Not on  file  Other Topics Concern  . Not on file  Social History Narrative  . Not on file   Social Determinants of Health   Financial Resource Strain:   . Difficulty of Paying Living Expenses:   Food Insecurity:   . Worried About Charity fundraiser in the Last Year:   . Arboriculturist in the Last Year:   Transportation Needs: No Transportation Needs  . Lack of Transportation (Medical): No  . Lack of Transportation  (Non-Medical): No  Physical Activity: Inactive  . Days of Exercise per Week: 0 days  . Minutes of Exercise per Session: 0 min  Stress:   . Feeling of Stress :   Social Connections:   . Frequency of Communication with Friends and Family:   . Frequency of Social Gatherings with Friends and Family:   . Attends Religious Services:   . Active Member of Clubs or Organizations:   . Attends Archivist Meetings:   Marland Kitchen Marital Status:   Intimate Partner Violence:   . Fear of Current or Ex-Partner:   . Emotionally Abused:   Marland Kitchen Physically Abused:   . Sexually Abused:     FH:  Family History  Problem Relation Age of Onset  . Hypertension Brother   . Diabetes Brother   . Hypertension Maternal Grandmother   . Heart attack Paternal Grandmother     Past Medical History:  Diagnosis Date  . Amputation below knee ([place]) RT   . Chronic diastolic HF (heart failure) ([place]) 10/21/2019  . COPD (chronic obstructive pulmonary disease) ([place])   . Diabetes type 2, uncontrolled ([place])   . Gastritis and duodenitis   . HTN (hypertension) 10/21/2019  . PAD (peripheral artery disease) ([place]) 10/21/2019  . PAD (peripheral artery disease) ([place])   . PNA (pneumonia)   . TIA (transient ischemic attack)     Current Outpatient Medications  Medication Sig Dispense Refill  . acetaminophen (TYLENOL) 325 MG tablet Take 650 mg by mouth every 6 (six) hours as needed for headache (pain).    Marland Kitchen albuterol (VENTOLIN HFA) 108 (90 Base) MCG/ACT inhaler Inhale 2 puffs into the lungs every 4 (four) hours as needed for wheezing or shortness of breath.    Marland Kitchen amLODipine (NORVASC) 10 MG tablet Take 1 tablet (10 mg total) by mouth daily. 30 tablet 0  . aspirin EC 81 MG EC tablet Take 1 tablet (81 mg total) by mouth daily. 30 tablet 0  . atorvastatin (LIPITOR) 80 MG tablet Take 80 mg by mouth at bedtime.    . busPIRone (BUSPAR) 5 MG tablet Take 5 mg by mouth every morning.    . clonazePAM (KLONOPIN) 0.25 MG disintegrating tablet  Take 1 tablet (0.25 mg total) by mouth 2 (two) times daily. 60 tablet 0  . cloNIDine (CATAPRES) 0.3 MG tablet Take 1 tablet (0.3 mg total) by mouth 2 (two) times daily. 60 tablet 0  . clopidogrel (PLAVIX) 75 MG tablet Take 75 mg by mouth daily.    Marland Kitchen FLUoxetine (PROZAC) 40 MG capsule Take 40 mg by mouth every morning.    . folic acid (FOLVITE) 1 MG tablet Take 1 tablet (1 mg total) by mouth daily. 30 tablet 0  . hydrALAZINE (APRESOLINE) 100 MG tablet Take 100 mg by mouth daily.    . insulin aspart (NOVOLOG) 100 UNIT/ML injection Inject 0-9 Units into the skin 3 (three) times daily with meals. 10 mL 11  . insulin glargine (LANTUS) 100 UNIT/ML injection Inject 0.2 mLs (  20 Units total) into the skin daily. 10 mL 11  . ipratropium-albuterol (DUONEB) 0.5-2.5 (3) MG/3ML SOLN Take 3 mLs by nebulization every 4 (four) hours as needed. 360 mL 0  . isosorbide mononitrate (IMDUR) 60 MG 24 hr tablet Take 1.5 tablets (90 mg total) by mouth daily. 45 tablet 0  . labetalol (NORMODYNE) 300 MG tablet Take 300 mg by mouth every 12 (twelve) hours.    . metolazone (ZAROXOLYN) 2.5 MG tablet Take 1 tablet (2.5 mg total) by mouth once a week. 1 tab every friday 90 tablet 3  . Multiple Vitamin (MULTIVITAMIN WITH MINERALS) TABS tablet Take 1 tablet by mouth daily. 30 tablet 0  . pantoprazole (PROTONIX) 40 MG tablet Take 40 mg by mouth daily.    . potassium chloride (KLOR-CON) 10 MEQ tablet Take 10 mEq by mouth daily.    Marland Kitchen senna (SENOKOT) 8.6 MG TABS tablet Take 2 tablets by mouth at bedtime.    . thiamine 100 MG tablet Take 1 tablet (100 mg total) by mouth daily. 30 tablet 0  . torsemide (DEMADEX) 20 MG tablet Take 3 tablets (60 mg total) by mouth 2 (two) times daily. 270 tablet 3  . venlafaxine (EFFEXOR) 37.5 MG tablet Take 37.5 mg by mouth daily.    . nitroGLYCERIN (NITROSTAT) 0.4 MG SL tablet Place 0.4 mg under the tongue every 5 (five) minutes as needed for chest pain.    . polyethylene glycol (MIRALAX / GLYCOLAX) 17  g packet Take 17 g by mouth daily as needed (constipation).     No current facility-administered medications for this encounter.    Vitals:   12/20/19 1454  BP: 134/62  Pulse: 80  SpO2: 100%   Wt Readings from Last 3 Encounters:  12/13/19 71.9 kg (158 lb 9.6 oz)  11/14/19 67.6 kg (149 lb)  11/13/19 67 kg (147 lb 12.8 oz)   PHYSICAL EXAM: General:  Chronically ill/ frail appearing WF. No respiratory difficulty HEENT: normal Neck: supple. no JVD. Carotids 2+ bilat; no bruits. No lymphadenopathy or thyromegaly appreciated. Cor: PMI nondisplaced. Regular rate & rhythm. No rubs, gallops or murmurs. Lungs: clear Abdomen: soft, nontender, nondistended. No hepatosplenomegaly. No bruits or masses. Good bowel sounds. Extremities: no cyanosis, clubbing, rash, edema, rt BKA  Neuro: alert & oriented x 3, cranial nerves grossly intact. moves all 4 extremities w/o difficulty. Affect pleasant but appears a bit depressed    ASSESSMENT & PLAN: 1. Chronic hypoxemic respiratory failure: 09/2019 COVID-19 viral pneumonia + CHF exacerbation. - Completed steroids and remdesivir for COVID-19.  -  Sats stable on 4 liters [place], which is her usual baseline. No increased O2 requirements    2. Chronic diastolic CHF: Multiple admissions over the last 6 months with hypertensive urgency/diastolic CHF.  Echo at [facility] 2/21 with EF 55-60%, restrictive diastolic function. Echo here with EF 55-60%, normal RV, IVC suggesting RA pressure 8 mmHg. RHC 3/10 showed optimized filling pressures.  With multiple recent diastolic CHF admissions, we placed a Cardiomems device.  Cardiomems reading on the day of discharge was 12.  Todays reading is at normal range at 11. Plan to continue daily transmissions.  - Continue torsemide 60 mg twice a day + 2.5 mg metolazone every Friday + supp KCL.  - Check BP today   3. Pleural effusions: 09/2019 Had thoracenteses at [facility] and again bilaterally here. Transudates with negative  cytology. Suspect due to CHF.   - lung exam ok today. No decreased BS to suggest recurrent effusion   4.  HTN:  - Controlled on current regimen  - Continue amlodipine 10 mg daily.  - Continue hydralazine 100 mg tid. - Continue labetalol to 300 mg bid.  - Continue clonidine 0.3 bid.   - Continue spironolactone 25 mg daily.  - Vascular renal artery dopplers--> .  1-59% stenosis of the right renal artery. Abnormal right  Left: 1-59% stenosis of the left renal artery. Abnormal    5. COPD: on chronic home O2. Stable   6. DM - Hgb A1C 6.9 on 10/23/19.  - Consider SGLT2i.   7. CAD: LHC 3/21 showed severe, calcified 3 vessel disease.  This likely plays a role in her multiple episodes of flash pulmonary edema.  Ideally, based on this anatomy she would have CABG.  However, she has had right BKA and does not have a prosthesis.  She is currently non-ambulatory.  She would not be able to rehab effectively after CABG. Now s/p atherectomy/PCI to prox/mid LAD and PCI mid LAD. For now, no intervention on LCx disease. - she denies any recent ischemic CP - Continue current dose of imdur.  -Continue atorvastatin 80 mg daily. Continue Crestor and Plavix.   F/u: Keep already scheduled f/u next week to reassess volume status again. She was instructed to continue on current regimen and continues w/ daily cardiomems transmission.    Luvern Mischke PA-C  3:22 PM

## 2019-12-20 NOTE — Progress Notes (Signed)
CSW met with pt and pt daughter in law during clinic visit to check in regarding information I had provided to dtr in law last week.  Pt does not want to take steps to move forward at this time and has resigned herself to staying at [facility] for the time being.  Pt appears to be upset regarding this and per dtr in law she is angry with family for the options they presented.  Pt does not want any assistance from CSW at this time but I encouraged them to reach out if they change their mind.  Jorge Ny, LCSW Clinical Social Worker [facility] Desk#: [phone] Cell#: [phone]

## 2019-12-20 NOTE — Patient Instructions (Signed)
Continue current medications as ordered. No changes today!  Labs today We will only contact you if something comes back abnormal or we need to make some changes. Otherwise no news is good news!  Your physician recommends that you schedule a follow-up appointment in: Keep appointment next week  Please call office at [phone] option 2 if you have any questions or concerns.   At the [facility], you and your health needs are our priority. As part of our continuing mission to provide you with exceptional heart care, we have created designated Provider Care Teams. These Care Teams include your primary Cardiologist (physician) and Advanced Practice Providers (APPs- Physician Assistants and Nurse Practitioners) who all work together to provide you with the care you need, when you need it.   You may see any of the following providers on your designated Care Team at your next follow up: Marland Kitchen Dr Glori Bickers . Dr Loralie Champagne . Darrick Grinder, NP . Lyda Jester, PA . Audry Riles, PharmD   Please be sure to bring in all your medications bottles to every appointment.

## 2019-12-24 ENCOUNTER — Ambulatory Visit (HOSPITAL_COMMUNITY)
Admission: RE | Admit: 2019-12-24 | Discharge: 2019-12-24 | Disposition: A | Discharge status: Home / Self Care | Payer: Medicaid Other | Source: Ambulatory Visit | Attending: Internal Medicine | Admitting: Internal Medicine

## 2019-12-24 DIAGNOSIS — I5032 Chronic diastolic (congestive) heart failure: Secondary | ICD-10-CM

## 2019-12-24 NOTE — Progress Notes (Signed)
     Cardiomems Update   Implant Date 310/2021     RHC 10/31/19 RA mean 2 RV 29/4 PA 27/10, mean 16 PCWP mean 13 LV 116/8 AO 113/53  Oxygen saturations: PA 69% AO 100%    PAD Range 20  Current PAD 10    Current Labs  12/20/19  Creatinine 1.31  BUN 40  Potassium 4.1       Recommendations  I have reviewed the patients PA monitoring at least weekly to bring PA pressures within optimal range.   Lower PAD range to 15  Amy Clegg

## 2019-12-27 ENCOUNTER — Ambulatory Visit (HOSPITAL_COMMUNITY)
Admission: RE | Admit: 2019-12-27 | Discharge: 2019-12-27 | Disposition: A | Discharge status: Home / Self Care | Payer: Medicaid Other | Source: Ambulatory Visit | Attending: Internal Medicine | Admitting: Internal Medicine

## 2019-12-27 ENCOUNTER — Other Ambulatory Visit: Payer: Self-pay

## 2019-12-27 ENCOUNTER — Encounter (HOSPITAL_COMMUNITY): Payer: Self-pay

## 2019-12-27 VITALS — BP 108/66 | HR 77

## 2019-12-27 DIAGNOSIS — Z794 Long term (current) use of insulin: Secondary | ICD-10-CM | POA: Diagnosis not present

## 2019-12-27 DIAGNOSIS — I5032 Chronic diastolic (congestive) heart failure: Secondary | ICD-10-CM | POA: Diagnosis not present

## 2019-12-27 DIAGNOSIS — R0600 Dyspnea, unspecified: Secondary | ICD-10-CM | POA: Diagnosis not present

## 2019-12-27 DIAGNOSIS — Z8616 Personal history of COVID-19: Secondary | ICD-10-CM | POA: Insufficient documentation

## 2019-12-27 DIAGNOSIS — E1151 Type 2 diabetes mellitus with diabetic peripheral angiopathy without gangrene: Secondary | ICD-10-CM | POA: Diagnosis not present

## 2019-12-27 DIAGNOSIS — E7800 Pure hypercholesterolemia, unspecified: Secondary | ICD-10-CM | POA: Insufficient documentation

## 2019-12-27 DIAGNOSIS — Z7902 Long term (current) use of antithrombotics/antiplatelets: Secondary | ICD-10-CM | POA: Diagnosis not present

## 2019-12-27 DIAGNOSIS — Z9981 Dependence on supplemental oxygen: Secondary | ICD-10-CM | POA: Insufficient documentation

## 2019-12-27 DIAGNOSIS — I2510 Atherosclerotic heart disease of native coronary artery without angina pectoris: Secondary | ICD-10-CM | POA: Insufficient documentation

## 2019-12-27 DIAGNOSIS — Z89511 Acquired absence of right leg below knee: Secondary | ICD-10-CM | POA: Insufficient documentation

## 2019-12-27 DIAGNOSIS — K219 Gastro-esophageal reflux disease without esophagitis: Secondary | ICD-10-CM | POA: Insufficient documentation

## 2019-12-27 DIAGNOSIS — J449 Chronic obstructive pulmonary disease, unspecified: Secondary | ICD-10-CM | POA: Insufficient documentation

## 2019-12-27 DIAGNOSIS — I110 Hypertensive heart disease with heart failure: Secondary | ICD-10-CM | POA: Insufficient documentation

## 2019-12-27 DIAGNOSIS — J9611 Chronic respiratory failure with hypoxia: Secondary | ICD-10-CM | POA: Diagnosis present

## 2019-12-27 DIAGNOSIS — Z8249 Family history of ischemic heart disease and other diseases of the circulatory system: Secondary | ICD-10-CM | POA: Insufficient documentation

## 2019-12-27 DIAGNOSIS — Z833 Family history of diabetes mellitus: Secondary | ICD-10-CM | POA: Diagnosis not present

## 2019-12-27 DIAGNOSIS — Z79899 Other long term (current) drug therapy: Secondary | ICD-10-CM | POA: Diagnosis not present

## 2019-12-27 DIAGNOSIS — Z955 Presence of coronary angioplasty implant and graft: Secondary | ICD-10-CM | POA: Diagnosis not present

## 2019-12-27 DIAGNOSIS — R109 Unspecified abdominal pain: Secondary | ICD-10-CM | POA: Insufficient documentation

## 2019-12-27 DIAGNOSIS — Z7982 Long term (current) use of aspirin: Secondary | ICD-10-CM | POA: Diagnosis not present

## 2019-12-27 DIAGNOSIS — J90 Pleural effusion, not elsewhere classified: Secondary | ICD-10-CM | POA: Diagnosis not present

## 2019-12-27 DIAGNOSIS — R0609 Other forms of dyspnea: Secondary | ICD-10-CM

## 2019-12-27 NOTE — Progress Notes (Signed)
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 Also had cardiomems placed . She was not discharged on lasix. Discharged back to SNF on 11/06/19.   Today she returns for HF follow up. Last visit lasix was stopped and torsemide was started 60 mg/60 mg. Overall feeling a little better. Complaining of shortness of breath. Denies PND/Orthopnea.  Says she is able to perform all ADLS at SNF. Ultimately she wants to go home. No chest pain. She is ready to get her prosthetic fitting. Appetite ok. No fever or chills. Weight at home has been trending down 157-->149   pounds. Taking all medications provided at SNF. Still living at [facility],  [facility] [phone] Fax [phone].    Cardiac Studies Echo 10/01/19 with EF 55-60% with restrictive LV filling pattern consistent with elevated LA pressure and borderline concentric LVH  10/20/19 Echo 10/20/19 with 55-60% with mild concentric LVH, both mean arterial pressure and LVEDP are elevated.  Mild MR and Mild TR trace PR RV systolic pressure is 48 mmHg     10/31/19 RHC/LHC + Cardiomems placement. Coronary Findings Diagnostic Dominance: Right Left Main  Mild 20% distal narrowing.  Left Anterior Descending  30% stenosis proximal LAD prior to D1. 50% stenosis  LAD at D1. 80-90% stenosis mid LAD. 75% proximal D1 stenosis, moderate-large vessel.  Left Circumflex  Long 70% stenosis ostial to proximal LCx. Tiny OM1 with diffuse severe disease. Small OM2 with 99% proximal stenosis. 60% stenosis proximal PLOM.  Right Coronary Artery  Long area of calcified plaque from the proximal into the mid RCA. 90% stenosis proximally, 80% stenosis in the mid RCA. 80% stenosis mid PDA. 70% stenosis mid PLV.  Intervention  No interventions have been documented. Right Heart  Right Heart Pressures RHC Procedural Findings: Hemodynamics (mmHg) RA mean 2 RV 29/4 PA 27/10, mean 16 PCWP mean 13 LV 116/8 AO 113/53  Oxygen saturations: PA 69% AO 100%  Cardiac Output (Fick) 6.2  Cardiac Index (Fick) 3.63   PCI (3/12): Atherectomy + DES to prox/mid RCA, DES mid LAD.      ROS: All systems negative except as listed in HPI, PMH and Problem List.  SH:  Social History   Socioeconomic History  . Marital status: Unknown    Spouse name: Not on file  . Number of children: Not on file  . Years of education: Not on file  . Highest education level: Not on file  Occupational History  . Not on file  Tobacco Use  . Smoking status: Never Smoker  . Smokeless tobacco: Never Used  Substance and Sexual Activity  . Alcohol use: Never  . Drug use: Never  . Sexual activity: Not on file  Other Topics Concern  . Not on file  Social History Narrative  . Not on file   Social Determinants of Health  Financial Resource Strain:   . Difficulty of Paying Living Expenses:   Food Insecurity:   . Worried About Charity fundraiser in the Last Year:   . Arboriculturist in the Last Year:   Transportation Needs: No Transportation Needs  . Lack of Transportation (Medical): No  . Lack of Transportation (Non-Medical): No  Physical Activity: Inactive  . Days of Exercise per Week: 0 days  . Minutes of Exercise per Session: 0 min  Stress:   . Feeling of Stress :   Social  Connections:   . Frequency of Communication with Friends and Family:   . Frequency of Social Gatherings with Friends and Family:   . Attends Religious Services:   . Active Member of Clubs or Organizations:   . Attends Archivist Meetings:   Marland Kitchen Marital Status:   Intimate Partner Violence:   . Fear of Current or Ex-Partner:   . Emotionally Abused:   Marland Kitchen Physically Abused:   . Sexually Abused:     FH:  Family History  Problem Relation Age of Onset  . Hypertension Brother   . Diabetes Brother   . Hypertension Maternal Grandmother   . Heart attack Paternal Grandmother     Past Medical History:  Diagnosis Date  . Amputation below knee ([place]) RT   . Chronic diastolic HF (heart failure) ([place]) 10/21/2019  . COPD (chronic obstructive pulmonary disease) ([place])   . Diabetes type 2, uncontrolled ([place])   . Gastritis and duodenitis   . HTN (hypertension) 10/21/2019  . PAD (peripheral artery disease) ([place]) 10/21/2019  . PAD (peripheral artery disease) ([place])   . PNA (pneumonia)   . TIA (transient ischemic attack)     Current Outpatient Medications  Medication Sig Dispense Refill  . acetaminophen (TYLENOL) 325 MG tablet Take 650 mg by mouth every 6 (six) hours as needed for headache (pain).    Marland Kitchen albuterol (VENTOLIN HFA) 108 (90 Base) MCG/ACT inhaler Inhale 2 puffs into the lungs every 4 (four) hours as needed for wheezing or shortness of breath.    Marland Kitchen amLODipine (NORVASC) 10 MG tablet Take 1 tablet (10 mg total) by mouth daily. 30 tablet 0  . aspirin EC 81 MG EC tablet Take 1 tablet (81 mg total) by mouth daily. 30 tablet 0  . atorvastatin (LIPITOR) 80 MG tablet Take 80 mg by mouth at bedtime.    . busPIRone (BUSPAR) 5 MG tablet Take 5 mg by mouth every morning.    . clonazePAM (KLONOPIN) 0.25 MG disintegrating tablet Take 1 tablet (0.25 mg total) by mouth 2 (two) times daily. 60 tablet 0  . cloNIDine (CATAPRES) 0.3 MG tablet Take 1 tablet (0.3 mg total) by mouth 2 (two) times daily. 60  tablet 0  . clopidogrel (PLAVIX) 75 MG tablet Take 75 mg by mouth daily.    Marland Kitchen FLUoxetine (PROZAC) 40 MG capsule Take 40 mg by mouth every morning.    . folic acid (FOLVITE) 1 MG tablet Take 1 tablet (1 mg total) by mouth daily. 30 tablet 0  . hydrALAZINE (APRESOLINE) 100 MG tablet Take 100 mg by mouth daily.    . insulin aspart (NOVOLOG) 100 UNIT/ML injection Inject 0-9 Units into the skin 3 (three) times daily with meals. 10 mL 11  . insulin glargine (LANTUS) 100 UNIT/ML injection Inject 0.2 mLs (20 Units total) into the skin daily. 10 mL 11  . ipratropium-albuterol (DUONEB) 0.5-2.5 (3) MG/3ML SOLN Take 3 mLs by nebulization every 4 (four) hours  as needed. 360 mL 0  . isosorbide mononitrate (IMDUR) 60 MG 24 hr tablet Take 1.5 tablets (90 mg total) by mouth daily. 45 tablet 0  . labetalol (NORMODYNE) 300 MG tablet Take 300 mg by mouth every 12 (twelve) hours.    . metolazone (ZAROXOLYN) 2.5 MG tablet Take 1 tablet (2.5 mg total) by mouth once a week. 1 tab every friday 90 tablet 3  . Multiple Vitamin (MULTIVITAMIN WITH MINERALS) TABS tablet Take 1 tablet by mouth daily. 30 tablet 0  . nitroGLYCERIN (NITROSTAT) 0.4 MG SL tablet Place 0.4 mg under the tongue every 5 (five) minutes as needed for chest pain.    . pantoprazole (PROTONIX) 40 MG tablet Take 40 mg by mouth daily.    . polyethylene glycol (MIRALAX / GLYCOLAX) 17 g packet Take 17 g by mouth daily as needed (constipation).    . potassium chloride (KLOR-CON) 10 MEQ tablet Take 10 mEq by mouth daily.    Marland Kitchen senna (SENOKOT) 8.6 MG TABS tablet Take 2 tablets by mouth at bedtime.    . thiamine 100 MG tablet Take 1 tablet (100 mg total) by mouth daily. 30 tablet 0  . torsemide (DEMADEX) 20 MG tablet Take 3 tablets (60 mg total) by mouth 2 (two) times daily. 270 tablet 3  . venlafaxine (EFFEXOR) 37.5 MG tablet Take 37.5 mg by mouth daily.     No current facility-administered medications for this encounter.    Vitals:   12/27/19 1343  BP:  108/66  Pulse: 77  SpO2: 100%   Wt Readings from Last 3 Encounters:  12/13/19 71.9 kg (158 lb 9.6 oz)  11/14/19 67.6 kg (149 lb)  11/13/19 67 kg (147 lb 12.8 oz)    PHYSICAL EXAM: General:  Arrived in a wheel chair. No resp difficulty HEENT: normal Neck: supple. no JVD. Carotids 2+ bilat; no bruits. No lymphadenopathy or thryomegaly appreciated. Cor: PMI nondisplaced. Regular rate & rhythm. No rubs, gallops or murmurs. Lungs: clear Abdomen: soft, nontender, nondistended. No hepatosplenomegaly. No bruits or masses. Good bowel sounds. Extremities: no cyanosis, clubbing, rash, edema. R BKA  Neuro: alert & orientedx3, cranial nerves grossly intact. moves all 4 extremities w/o difficulty. Affect pleasant   ASSESSMENT & PLAN: 1. Chronic hypoxemic respiratory failure: 09/2019 COVID-19 viral pneumonia + CHF exacerbation. - Completed steroids and remdesivir for COVID-19.  -  Sats stable on 4 liters [place].     2. Chronic diastolic CHF: Multiple admissions over the last 6 months with hypertensive urgency/diastolic CHF.  Echo at [facility] 2/21 with EF 55-60%, restrictive diastolic function. Echo here with EF 55-60%, normal RV, IVC suggesting RA pressure 8 mmHg. RHC 3/10 showed optimized filling pressures.  With multiple recent diastolic CHF admissions, we placed a Cardiomems device.  Cardiomems reading on the day of discharge was 12.  Todays reading is up to 9 mm hg. Much improved. Plan to continue daily transmissions.  - Volume status stable. Continue  torsemide 60 mg twice a day and start 2.5 mg metolazone every Friday.  - I reviewed BMET 12/20/19   3. Pleural effusions: 09/2019 Had thoracenteses at [facility] and again bilaterally here. Transudates with negative cytology. Suspect due to CHF.    4. HTN:  - Stable. Continue current regimen.  - Continue amlodipine 10 mg daily.  - Continue hydralazine 100 mg tid. - Continue labetalol to 300 mg bid.  - Continue clonidine 0.3 bid.   - Continue  spironolactone 25 mg daily.  - Hold off on ACEI/ARB with AKI.  -  Vascular renal artery dopplers--> .  1-59% stenosis of the right renal artery. Abnormal right  Left: 1-59% stenosis of the left renal artery. Abnormal  5. AKI:  Resolved.   6. Abdominal pain: CT abdomen earlier in admission with 1.5 cm liver lesion but no other significant abdominal abnormality. She has Abdomen 3/1 showed no acute abdominal findings.   7. COPD: Per report on 4L home oxygen. Oxygen saturations stable.   8. DM: Per SNF -Hgb A1C 6.9 on 10/23/19.  - Consider SGLT2i.   9. CAD: Coronary angiography today showed severe, calcified 3 vessel disease.  This likely plays a role in her multiple episodes of flash pulmonary edema.  Ideally, based on this anatomy she would have CABG.  However, she has had right BKA and does not have a prosthesis.  She is currently non-ambulatory.  She would not be able to rehab effectively after CABG.   Now s/p atherectomy/PCI to prox/mid LAD and PCI mid LAD. For now, no intervention on LCx disease. HsTnI checked last night and elevated but repeat trended down.  ?if this was post-PCI.   - No chest pain. Continue current dose of imdur.  -Continue atorvastatin 80 mg daily. Continue Crestor, Plavix.   Follow up in 4 weeks.   Amy Clegg NP-C  1:50 PM

## 2020-01-22 ENCOUNTER — Encounter (HOSPITAL_COMMUNITY): Payer: Medicaid Other

## 2020-01-24 ENCOUNTER — Ambulatory Visit (HOSPITAL_COMMUNITY)
Admission: RE | Admit: 2020-01-24 | Discharge: 2020-01-24 | Disposition: A | Discharge status: Home / Self Care | Payer: Medicaid Other | Source: Ambulatory Visit | Attending: Adult Health | Admitting: Adult Health

## 2020-01-24 ENCOUNTER — Encounter (HOSPITAL_COMMUNITY): Payer: Self-pay

## 2020-01-24 ENCOUNTER — Other Ambulatory Visit: Payer: Self-pay

## 2020-01-24 VITALS — BP 122/73 | HR 84 | Wt 147.2 lb

## 2020-01-24 DIAGNOSIS — I5032 Chronic diastolic (congestive) heart failure: Secondary | ICD-10-CM | POA: Insufficient documentation

## 2020-01-24 DIAGNOSIS — R109 Unspecified abdominal pain: Secondary | ICD-10-CM | POA: Insufficient documentation

## 2020-01-24 DIAGNOSIS — Z8616 Personal history of COVID-19: Secondary | ICD-10-CM | POA: Insufficient documentation

## 2020-01-24 DIAGNOSIS — Z9981 Dependence on supplemental oxygen: Secondary | ICD-10-CM | POA: Diagnosis not present

## 2020-01-24 DIAGNOSIS — Z7982 Long term (current) use of aspirin: Secondary | ICD-10-CM | POA: Insufficient documentation

## 2020-01-24 DIAGNOSIS — Z8249 Family history of ischemic heart disease and other diseases of the circulatory system: Secondary | ICD-10-CM | POA: Diagnosis not present

## 2020-01-24 DIAGNOSIS — Z79899 Other long term (current) drug therapy: Secondary | ICD-10-CM | POA: Insufficient documentation

## 2020-01-24 DIAGNOSIS — Z7902 Long term (current) use of antithrombotics/antiplatelets: Secondary | ICD-10-CM | POA: Insufficient documentation

## 2020-01-24 DIAGNOSIS — Z8673 Personal history of transient ischemic attack (TIA), and cerebral infarction without residual deficits: Secondary | ICD-10-CM | POA: Insufficient documentation

## 2020-01-24 DIAGNOSIS — I2510 Atherosclerotic heart disease of native coronary artery without angina pectoris: Secondary | ICD-10-CM | POA: Insufficient documentation

## 2020-01-24 DIAGNOSIS — I10 Essential (primary) hypertension: Secondary | ICD-10-CM | POA: Diagnosis present

## 2020-01-24 DIAGNOSIS — Z833 Family history of diabetes mellitus: Secondary | ICD-10-CM | POA: Diagnosis not present

## 2020-01-24 DIAGNOSIS — J440 Chronic obstructive pulmonary disease with acute lower respiratory infection: Secondary | ICD-10-CM | POA: Insufficient documentation

## 2020-01-24 DIAGNOSIS — I701 Atherosclerosis of renal artery: Secondary | ICD-10-CM | POA: Diagnosis not present

## 2020-01-24 DIAGNOSIS — J9611 Chronic respiratory failure with hypoxia: Secondary | ICD-10-CM | POA: Diagnosis not present

## 2020-01-24 DIAGNOSIS — Z89511 Acquired absence of right leg below knee: Secondary | ICD-10-CM | POA: Diagnosis not present

## 2020-01-24 DIAGNOSIS — I110 Hypertensive heart disease with heart failure: Secondary | ICD-10-CM | POA: Diagnosis not present

## 2020-01-24 DIAGNOSIS — F419 Anxiety disorder, unspecified: Secondary | ICD-10-CM | POA: Insufficient documentation

## 2020-01-24 DIAGNOSIS — R0609 Other forms of dyspnea: Secondary | ICD-10-CM

## 2020-01-24 DIAGNOSIS — E1151 Type 2 diabetes mellitus with diabetic peripheral angiopathy without gangrene: Secondary | ICD-10-CM | POA: Insufficient documentation

## 2020-01-24 DIAGNOSIS — K219 Gastro-esophageal reflux disease without esophagitis: Secondary | ICD-10-CM | POA: Insufficient documentation

## 2020-01-24 DIAGNOSIS — F329 Major depressive disorder, single episode, unspecified: Secondary | ICD-10-CM | POA: Diagnosis not present

## 2020-01-24 DIAGNOSIS — Z794 Long term (current) use of insulin: Secondary | ICD-10-CM | POA: Insufficient documentation

## 2020-01-24 DIAGNOSIS — R0600 Dyspnea, unspecified: Secondary | ICD-10-CM | POA: Diagnosis not present

## 2020-01-24 NOTE — Progress Notes (Signed)
CSW met with pt and pt daughter in law at their request.  Pt is feeling better than she has at past visits and is ready to talk about her future options.  Pt expresses that her ultimate goal is still to return home but understands that it is not likely to happen at this time.  She has become more comfortable at her SNF but is hopeful to move to a SNF or ALF closer to home so visiting will be easier for her family.  CSW had provided a list of ALF and SNFs to pt daughter in law on a previous visit and she reports she still has it.  CSW encouraged them to start touring facilities near home that they may be interested in and talking to their admissions staff if they want to move forward with getting the pt on the waitlist or transferred.    CSW encouraged them to reach out if the facilities needed anything from the pts providers and I would be glad to assist- will continue to follow and assist as needed  Jorge Ny, [facility] Desk#: [phone] Cell#: [phone]

## 2020-01-24 NOTE — Addendum Note (Signed)
Encounter addended by: Jorge Ny, LCSW on: 01/24/2020 4:02 PM  Actions taken: Clinical Note Signed

## 2020-01-24 NOTE — Patient Instructions (Signed)
It was great to see you today! No medication changes are needed at this time.   Your physician recommends that you schedule a follow-up appointment in: 3 months with Dr McLean  Do the following things EVERYDAY: 1) Weigh yourself in the morning before breakfast. Write it down and keep it in a log. 2) Take your medicines as prescribed 3) Eat low salt foods--Limit salt (sodium) to 2000 mg per day.  4) Stay as active as you can everyday 5) Limit all fluids for the day to less than 2 liters  At the [facility], you and your health needs are our priority. As part of our continuing mission to provide you with exceptional heart care, we have created designated Provider Care Teams. These Care Teams include your primary Cardiologist (physician) and Advanced Practice Providers (APPs- Physician Assistants and Nurse Practitioners) who all work together to provide you with the care you need, when you need it.   You may see any of the following providers on your designated Care Team at your next follow up: . Dr Daniel Bensimhon . Dr Dalton McLean . Amy Clegg, NP . Brittainy Simmons, PA . Lauren Kemp, PharmD   Please be sure to bring in all your medications bottles to every appointment.     

## 2020-01-24 NOTE — Progress Notes (Signed)
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 Also had cardiomems placed . She was not discharged on lasix. Discharged back to SNF on 11/06/19.   Today she returns for HF follow up.Overall feeling ok. Anxious to get RLE BKA. Mild dyspnea with exertion. Denies PND/Orthopnea. Appetite ok. No fever or chills. Taking all medications. Taking all medications provided at SNF. Still living at [facility]  [facility] [phone] Fax [phone].    Cardiac Studies Echo 10/01/19 with EF 55-60% with restrictive LV filling pattern consistent with elevated LA pressure and borderline concentric LVH  10/20/19 Echo 10/20/19 with 55-60% with mild concentric LVH, both mean arterial pressure and LVEDP are elevated.  Mild MR and Mild TR trace PR RV systolic pressure is 48 mmHg     10/31/19 RHC/LHC + Cardiomems placement. Coronary Findings Diagnostic Dominance: Right Left Main  Mild 20% distal narrowing.  Left Anterior Descending  30% stenosis proximal LAD prior to D1. 50% stenosis LAD at D1. 80-90% stenosis mid LAD. 75% proximal D1 stenosis, moderate-large vessel.  Left Circumflex  Long 70% stenosis ostial to proximal LCx. Tiny OM1 with diffuse severe disease. Small OM2 with 99% proximal stenosis. 60%  stenosis proximal PLOM.  Right Coronary Artery  Long area of calcified plaque from the proximal into the mid RCA. 90% stenosis proximally, 80% stenosis in the mid RCA. 80% stenosis mid PDA. 70% stenosis mid PLV.  Intervention  No interventions have been documented. Right Heart  Right Heart Pressures RHC Procedural Findings: Hemodynamics (mmHg) RA mean 2 RV 29/4 PA 27/10, mean 16 PCWP mean 13 LV 116/8 AO 113/53  Oxygen saturations: PA 69% AO 100%  Cardiac Output (Fick) 6.2  Cardiac Index (Fick) 3.63   PCI (3/12): Atherectomy + DES to prox/mid RCA, DES mid LAD.      ROS: All systems negative except as listed in HPI, PMH and Problem List.  SH:  Social History   Socioeconomic History  . Marital status: Unknown    Spouse name: Not on file  . Number of children: Not on file  . Years of education: Not on file  . Highest education level: Not on file  Occupational History  . Not on file  Tobacco Use  . Smoking status: Never Smoker  . Smokeless tobacco: Never Used  Substance and Sexual Activity  . Alcohol use: Never  . Drug use: Never  . Sexual activity: Not on file  Other Topics Concern  . Not on file  Social History Narrative  . Not on file   Social Determinants of Health   Financial Resource Strain:   . Difficulty of Paying Living Expenses:   Food Insecurity:   . Worried About Charity fundraiser in the Last Year:   . Arboriculturist in the Last Year:   Transportation Needs: No  Transportation Needs  . Lack of Transportation (Medical): No  . Lack of Transportation (Non-Medical): No  Physical Activity: Inactive  . Days of Exercise per Week: 0 days  . Minutes of Exercise per Session: 0 min  Stress:   . Feeling of Stress :   Social Connections:   . Frequency of Communication with Friends and Family:   . Frequency of Social Gatherings with Friends and Family:   . Attends Religious Services:   . Active Member of Clubs or Organizations:   . Attends English as a second language teacher Meetings:   Marland Kitchen Marital Status:   Intimate Partner Violence:   . Fear of Current or Ex-Partner:   . Emotionally Abused:   Marland Kitchen Physically Abused:   . Sexually Abused:     FH:  Family History  Problem Relation Age of Onset  . Hypertension Brother   . Diabetes Brother   . Hypertension Maternal Grandmother   . Heart attack Paternal Grandmother     Past Medical History:  Diagnosis Date  . Amputation below knee ([place]) RT   . Chronic diastolic HF (heart failure) ([place]) 10/21/2019  . COPD (chronic obstructive pulmonary disease) ([place])   . Diabetes type 2, uncontrolled ([place])   . Gastritis and duodenitis   . HTN (hypertension) 10/21/2019  . PAD (peripheral artery disease) ([place]) 10/21/2019  . PAD (peripheral artery disease) ([place])   . PNA (pneumonia)   . TIA (transient ischemic attack)     Current Outpatient Medications  Medication Sig Dispense Refill  . amLODipine (NORVASC) 10 MG tablet Take 1 tablet (10 mg total) by mouth daily. 30 tablet 0  . aspirin EC 81 MG EC tablet Take 1 tablet (81 mg total) by mouth daily. 30 tablet 0  . busPIRone (BUSPAR) 5 MG tablet Take 5 mg by mouth every morning.    . clonazePAM (KLONOPIN) 0.25 MG disintegrating tablet Take 1 tablet (0.25 mg total) by mouth 2 (two) times daily. 60 tablet 0  . cloNIDine (CATAPRES) 0.3 MG tablet Take 1 tablet (0.3 mg total) by mouth 2 (two) times daily. 60 tablet 0  . clopidogrel (PLAVIX) 75 MG tablet Take 75 mg by mouth daily.    Marland Kitchen FLUoxetine (PROZAC) 40 MG capsule Take 40 mg by mouth every morning.    . folic acid (FOLVITE) 1 MG tablet Take 1 tablet (1 mg total) by mouth daily. 30 tablet 0  . hydrALAZINE (APRESOLINE) 100 MG tablet Take 100 mg by mouth daily.    . insulin aspart (NOVOLOG) 100 UNIT/ML injection Inject 0-9 Units into the skin 3 (three) times daily with meals. 10 mL 11  . insulin glargine (LANTUS) 100 UNIT/ML injection Inject 0.2 mLs (20 Units total) into the skin daily. 10 mL 11  .  ipratropium-albuterol (DUONEB) 0.5-2.5 (3) MG/3ML SOLN Take 3 mLs by nebulization every 4 (four) hours as needed. 360 mL 0  . isosorbide mononitrate (IMDUR) 60 MG 24 hr tablet Take 1.5 tablets (90 mg total) by mouth daily. 45 tablet 0  . labetalol (NORMODYNE) 300 MG tablet Take 300 mg by mouth every 12 (twelve) hours.    . metolazone (ZAROXOLYN) 2.5 MG tablet Take 1 tablet (2.5 mg total) by mouth once a week. 1 tab every friday 90 tablet 3  . Multiple Vitamin (MULTIVITAMIN WITH MINERALS) TABS tablet Take 1 tablet by mouth daily. 30 tablet 0  . nitroGLYCERIN (NITROSTAT) 0.4 MG SL tablet Place 0.4 mg under the tongue every 5 (five) minutes as needed for chest pain.    Marland Kitchen  pantoprazole (PROTONIX) 40 MG tablet Take 40 mg by mouth daily.    . polyethylene glycol (MIRALAX / GLYCOLAX) 17 g packet Take 17 g by mouth daily as needed (constipation).    . potassium chloride (KLOR-CON) 10 MEQ tablet Take 10 mEq by mouth daily.    Marland Kitchen senna (SENOKOT) 8.6 MG TABS tablet Take 2 tablets by mouth at bedtime.    . thiamine 100 MG tablet Take 1 tablet (100 mg total) by mouth daily. 30 tablet 0  . torsemide (DEMADEX) 20 MG tablet Take 3 tablets (60 mg total) by mouth 2 (two) times daily. 270 tablet 3  . venlafaxine (EFFEXOR) 37.5 MG tablet Take 37.5 mg by mouth daily.    Marland Kitchen acetaminophen (TYLENOL) 325 MG tablet Take 650 mg by mouth every 6 (six) hours as needed for headache (pain).    Marland Kitchen albuterol (VENTOLIN HFA) 108 (90 Base) MCG/ACT inhaler Inhale 2 puffs into the lungs every 4 (four) hours as needed for wheezing or shortness of breath.    Marland Kitchen atorvastatin (LIPITOR) 80 MG tablet Take 80 mg by mouth at bedtime.     No current facility-administered medications for this encounter.    Vitals:   01/24/20 1351  BP: 122/73  Pulse: 84  SpO2: 100%  Weight: 66.8 kg (147 lb 3.2 oz)   Wt Readings from Last 3 Encounters:  01/24/20 66.8 kg (147 lb 3.2 oz)  12/13/19 71.9 kg (158 lb 9.6 oz)  11/14/19 67.6 kg (149 lb)     General:  Well appearing. No resp difficulty HEENT: normal Neck: supple. no JVD. Carotids 2+ bilat; no bruits. No lymphadenopathy or thryomegaly appreciated. Cor: PMI nondisplaced. Regular rate & rhythm. No rubs, gallops or murmurs. Lungs: clear Abdomen: soft, nontender, nondistended. No hepatosplenomegaly. No bruits or masses. Good bowel sounds. Extremities: no cyanosis, clubbing, rash, edema Neuro: alert & orientedx3, cranial nerves grossly intact. moves all 4 extremities w/o difficulty. Affect pleasant   ASSESSMENT & PLAN: 1. Chronic hypoxemic respiratory failure: 09/2019 COVID-19 viral pneumonia + CHF exacerbation. - Completed steroids and remdesivir for COVID-19.  -  Sats stable on 4 liters [place].   - O2 sats    2. Chronic diastolic CHF: Multiple admissions over the last 6 months with hypertensive urgency/diastolic CHF.  Echo at [facility] 2/21 with EF 55-60%, restrictive diastolic function. Echo here with EF 55-60%, normal RV, IVC suggesting RA pressure 8 mmHg. RHC 3/10 showed optimized filling pressures.  With multiple recent diastolic CHF admissions, we placed a Cardiomems device.  Cardiomems reading on the day of discharge was 12.  Todays reading 8  mm hg.  - NYHA III. Volume status stable. Continue  torsemide 60 mg twice a day and start 2.5 mg metolazone every Friday.   3. Pleural effusions: 09/2019 Had thoracenteses at [facility] and again bilaterally here. Transudates with negative cytology. Suspect due to CHF.    4. HTN:  - Stable.  - Continue amlodipine 10 mg daily.  - Continue hydralazine 100 mg tid. - Continue labetalol to 300 mg bid.  - Continue clonidine 0.3 bid.   - Continue spironolactone 25 mg daily.  - Hold off on ACEI/ARB with AKI.  - Vascular renal artery dopplers--> .  1-59% stenosis of the right renal artery. Abnormal right  Left: 1-59% stenosis of the left renal artery. Abnormal  5. AKI:  Resolved.   6. Abdominal pain: CT abdomen earlier in admission  with 1.5 cm liver lesion but no other significant abdominal abnormality. She has Abdomen 3/1 showed  no acute abdominal findings.   7. COPD: Per report on 4L home oxygen. O2 sats stable.   8. DM: Per SNF -Hgb A1C 6.9 on 10/23/19.  - Consider SGLT2i.   9. CAD: Coronary angiography today showed severe, calcified 3 vessel disease.  This likely plays a role in her multiple episodes of flash pulmonary edema.  Ideally, based on this anatomy she would have CABG.  However, she has had right BKA and does not have a prosthesis.  She is currently non-ambulatory.  She would not be able to rehab effectively after CABG.   Now s/p atherectomy/PCI to prox/mid LAD and PCI mid LAD. For now, no intervention on LCx disease.  - No chest pain.  - Continue current dose of imdur.  -Continue atorvastatin 80 mg daily. Continue Crestor, Plavix.   Follow up in 3 months.  Hopefully she can get her prosthetic to enable additional independence. Her goal is to go home!!    Jamie Macdonnell NP-C  1:59 PM

## 2020-01-28 ENCOUNTER — Ambulatory Visit (HOSPITAL_COMMUNITY)
Admission: RE | Admit: 2020-01-28 | Discharge: 2020-01-28 | Disposition: A | Discharge status: Home / Self Care | Payer: Medicaid Other | Source: Ambulatory Visit | Attending: Adult Health | Admitting: Adult Health

## 2020-01-28 ENCOUNTER — Other Ambulatory Visit: Payer: Self-pay

## 2020-01-28 DIAGNOSIS — I5032 Chronic diastolic (congestive) heart failure: Secondary | ICD-10-CM

## 2020-01-28 DIAGNOSIS — I5022 Chronic systolic (congestive) heart failure: Secondary | ICD-10-CM

## 2020-01-28 NOTE — Progress Notes (Signed)
PAD Goal 11  PAD Reading 14  I have reviewed current PAD from cardiomems device. PAD reading stable. .  Continue current diuretic regimen.   Follow monthly  Myrl Bynum  NP-C  5:46 PM

## 2020-02-08 ENCOUNTER — Encounter (HOSPITAL_COMMUNITY): Payer: Self-pay

## 2020-02-08 DIAGNOSIS — I10 Essential (primary) hypertension: Secondary | ICD-10-CM | POA: Diagnosis not present

## 2020-02-08 DIAGNOSIS — E785 Hyperlipidemia, unspecified: Secondary | ICD-10-CM | POA: Diagnosis not present

## 2020-02-08 DIAGNOSIS — I509 Heart failure, unspecified: Secondary | ICD-10-CM | POA: Diagnosis not present

## 2020-02-08 DIAGNOSIS — J90 Pleural effusion, not elsewhere classified: Secondary | ICD-10-CM | POA: Diagnosis not present

## 2020-02-08 DIAGNOSIS — R0602 Shortness of breath: Secondary | ICD-10-CM

## 2020-02-09 DIAGNOSIS — I509 Heart failure, unspecified: Secondary | ICD-10-CM | POA: Diagnosis not present

## 2020-02-09 DIAGNOSIS — J90 Pleural effusion, not elsewhere classified: Secondary | ICD-10-CM | POA: Diagnosis not present

## 2020-02-09 DIAGNOSIS — I10 Essential (primary) hypertension: Secondary | ICD-10-CM | POA: Diagnosis not present

## 2020-02-09 DIAGNOSIS — I517 Cardiomegaly: Secondary | ICD-10-CM | POA: Diagnosis not present

## 2020-02-09 DIAGNOSIS — E785 Hyperlipidemia, unspecified: Secondary | ICD-10-CM | POA: Diagnosis not present

## 2020-02-10 DIAGNOSIS — I509 Heart failure, unspecified: Secondary | ICD-10-CM | POA: Diagnosis not present

## 2020-02-10 DIAGNOSIS — J90 Pleural effusion, not elsewhere classified: Secondary | ICD-10-CM | POA: Diagnosis not present

## 2020-02-10 DIAGNOSIS — I10 Essential (primary) hypertension: Secondary | ICD-10-CM | POA: Diagnosis not present

## 2020-02-10 DIAGNOSIS — E785 Hyperlipidemia, unspecified: Secondary | ICD-10-CM | POA: Diagnosis not present

## 2020-02-11 DIAGNOSIS — I509 Heart failure, unspecified: Secondary | ICD-10-CM | POA: Diagnosis not present

## 2020-02-11 DIAGNOSIS — J90 Pleural effusion, not elsewhere classified: Secondary | ICD-10-CM | POA: Diagnosis not present

## 2020-02-11 DIAGNOSIS — I10 Essential (primary) hypertension: Secondary | ICD-10-CM | POA: Diagnosis not present

## 2020-02-11 DIAGNOSIS — E785 Hyperlipidemia, unspecified: Secondary | ICD-10-CM | POA: Diagnosis not present

## 2020-02-12 DIAGNOSIS — E785 Hyperlipidemia, unspecified: Secondary | ICD-10-CM | POA: Diagnosis not present

## 2020-02-12 DIAGNOSIS — I509 Heart failure, unspecified: Secondary | ICD-10-CM | POA: Diagnosis not present

## 2020-02-12 DIAGNOSIS — J90 Pleural effusion, not elsewhere classified: Secondary | ICD-10-CM | POA: Diagnosis not present

## 2020-02-12 DIAGNOSIS — I10 Essential (primary) hypertension: Secondary | ICD-10-CM | POA: Diagnosis not present

## 2020-02-18 ENCOUNTER — Encounter (HOSPITAL_COMMUNITY): Payer: Medicaid Other | Admitting: Cardiology

## 2020-02-25 DIAGNOSIS — I2510 Atherosclerotic heart disease of native coronary artery without angina pectoris: Secondary | ICD-10-CM | POA: Diagnosis not present

## 2020-02-26 ENCOUNTER — Inpatient Hospital Stay
Admission: AD | Admit: 2020-02-26 | Payer: Medicaid Other | Source: Other Acute Inpatient Hospital | Admitting: Internal Medicine

## 2020-02-26 ENCOUNTER — Telehealth: Payer: Self-pay | Admitting: *Deleted

## 2020-02-26 DIAGNOSIS — I2510 Atherosclerotic heart disease of native coronary artery without angina pectoris: Secondary | ICD-10-CM | POA: Diagnosis not present

## 2020-02-26 DIAGNOSIS — S72409A Unspecified fracture of lower end of unspecified femur, initial encounter for closed fracture: Secondary | ICD-10-CM | POA: Diagnosis not present

## 2020-02-26 DIAGNOSIS — D649 Anemia, unspecified: Secondary | ICD-10-CM | POA: Diagnosis not present

## 2020-02-27 DIAGNOSIS — S72409A Unspecified fracture of lower end of unspecified femur, initial encounter for closed fracture: Secondary | ICD-10-CM | POA: Diagnosis not present

## 2020-02-27 DIAGNOSIS — I2510 Atherosclerotic heart disease of native coronary artery without angina pectoris: Secondary | ICD-10-CM | POA: Diagnosis not present

## 2020-02-27 DIAGNOSIS — D649 Anemia, unspecified: Secondary | ICD-10-CM | POA: Diagnosis not present

## 2020-02-28 DIAGNOSIS — D649 Anemia, unspecified: Secondary | ICD-10-CM | POA: Diagnosis not present

## 2020-02-28 DIAGNOSIS — I2510 Atherosclerotic heart disease of native coronary artery without angina pectoris: Secondary | ICD-10-CM | POA: Diagnosis not present

## 2020-02-28 DIAGNOSIS — S72409A Unspecified fracture of lower end of unspecified femur, initial encounter for closed fracture: Secondary | ICD-10-CM | POA: Diagnosis not present

## 2020-02-29 DIAGNOSIS — S72409A Unspecified fracture of lower end of unspecified femur, initial encounter for closed fracture: Secondary | ICD-10-CM | POA: Diagnosis not present

## 2020-02-29 DIAGNOSIS — D649 Anemia, unspecified: Secondary | ICD-10-CM | POA: Diagnosis not present

## 2020-02-29 DIAGNOSIS — I2510 Atherosclerotic heart disease of native coronary artery without angina pectoris: Secondary | ICD-10-CM | POA: Diagnosis not present

## 2020-03-01 DIAGNOSIS — I2510 Atherosclerotic heart disease of native coronary artery without angina pectoris: Secondary | ICD-10-CM | POA: Diagnosis not present

## 2020-03-01 DIAGNOSIS — D649 Anemia, unspecified: Secondary | ICD-10-CM | POA: Diagnosis not present

## 2020-03-01 DIAGNOSIS — E785 Hyperlipidemia, unspecified: Secondary | ICD-10-CM | POA: Diagnosis not present

## 2020-03-01 DIAGNOSIS — I10 Essential (primary) hypertension: Secondary | ICD-10-CM

## 2020-03-01 DIAGNOSIS — S72409A Unspecified fracture of lower end of unspecified femur, initial encounter for closed fracture: Secondary | ICD-10-CM | POA: Diagnosis not present

## 2020-03-01 DIAGNOSIS — Z794 Long term (current) use of insulin: Secondary | ICD-10-CM

## 2020-03-02 DIAGNOSIS — I2510 Atherosclerotic heart disease of native coronary artery without angina pectoris: Secondary | ICD-10-CM | POA: Diagnosis not present

## 2020-03-02 DIAGNOSIS — D649 Anemia, unspecified: Secondary | ICD-10-CM | POA: Diagnosis not present

## 2020-03-02 DIAGNOSIS — S72409A Unspecified fracture of lower end of unspecified femur, initial encounter for closed fracture: Secondary | ICD-10-CM | POA: Diagnosis not present

## 2020-03-02 DIAGNOSIS — E785 Hyperlipidemia, unspecified: Secondary | ICD-10-CM | POA: Diagnosis not present

## 2020-03-03 ENCOUNTER — Encounter (HOSPITAL_COMMUNITY): Payer: Medicaid Other | Admitting: Cardiology

## 2020-03-03 DIAGNOSIS — S72409A Unspecified fracture of lower end of unspecified femur, initial encounter for closed fracture: Secondary | ICD-10-CM | POA: Diagnosis not present

## 2020-03-03 DIAGNOSIS — E785 Hyperlipidemia, unspecified: Secondary | ICD-10-CM | POA: Diagnosis not present

## 2020-03-03 DIAGNOSIS — D649 Anemia, unspecified: Secondary | ICD-10-CM | POA: Diagnosis not present

## 2020-03-03 DIAGNOSIS — I2510 Atherosclerotic heart disease of native coronary artery without angina pectoris: Secondary | ICD-10-CM | POA: Diagnosis not present

## 2020-03-26 ENCOUNTER — Other Ambulatory Visit: Payer: Self-pay | Admitting: Internal Medicine

## 2020-03-27 ENCOUNTER — Other Ambulatory Visit: Payer: Self-pay | Admitting: Internal Medicine

## 2020-03-27 DIAGNOSIS — M810 Age-related osteoporosis without current pathological fracture: Secondary | ICD-10-CM

## 2020-03-28 ENCOUNTER — Telehealth (HOSPITAL_COMMUNITY): Payer: Self-pay

## 2020-03-28 NOTE — Telephone Encounter (Signed)
Received a fax requesting medical clearance for patients colonoscopy/EGD from [facility]. Records were successfully faxed to: [phone] ,which was the number provided.. Medical clearance form will be scanned into patients chart.

## 2020-05-12 ENCOUNTER — Ambulatory Visit (HOSPITAL_COMMUNITY)
Admission: RE | Admit: 2020-05-12 | Discharge: 2020-05-12 | Disposition: A | Discharge status: Home / Self Care | Payer: Medicaid Other | Source: Ambulatory Visit | Attending: Cardiology | Admitting: Cardiology

## 2020-05-12 ENCOUNTER — Other Ambulatory Visit: Payer: Self-pay

## 2020-05-12 VITALS — BP 110/60 | HR 83 | Wt 152.4 lb

## 2020-05-12 DIAGNOSIS — Z8616 Personal history of COVID-19: Secondary | ICD-10-CM | POA: Insufficient documentation

## 2020-05-12 DIAGNOSIS — E1140 Type 2 diabetes mellitus with diabetic neuropathy, unspecified: Secondary | ICD-10-CM | POA: Diagnosis not present

## 2020-05-12 DIAGNOSIS — N183 Chronic kidney disease, stage 3 unspecified: Secondary | ICD-10-CM | POA: Insufficient documentation

## 2020-05-12 DIAGNOSIS — E7800 Pure hypercholesterolemia, unspecified: Secondary | ICD-10-CM | POA: Diagnosis not present

## 2020-05-12 DIAGNOSIS — Z9981 Dependence on supplemental oxygen: Secondary | ICD-10-CM | POA: Diagnosis not present

## 2020-05-12 DIAGNOSIS — J9611 Chronic respiratory failure with hypoxia: Secondary | ICD-10-CM | POA: Insufficient documentation

## 2020-05-12 DIAGNOSIS — I160 Hypertensive urgency: Secondary | ICD-10-CM | POA: Diagnosis not present

## 2020-05-12 DIAGNOSIS — J90 Pleural effusion, not elsewhere classified: Secondary | ICD-10-CM

## 2020-05-12 DIAGNOSIS — I2510 Atherosclerotic heart disease of native coronary artery without angina pectoris: Secondary | ICD-10-CM | POA: Insufficient documentation

## 2020-05-12 DIAGNOSIS — Z79899 Other long term (current) drug therapy: Secondary | ICD-10-CM | POA: Diagnosis not present

## 2020-05-12 DIAGNOSIS — Z7902 Long term (current) use of antithrombotics/antiplatelets: Secondary | ICD-10-CM | POA: Diagnosis not present

## 2020-05-12 DIAGNOSIS — E1122 Type 2 diabetes mellitus with diabetic chronic kidney disease: Secondary | ICD-10-CM | POA: Diagnosis not present

## 2020-05-12 DIAGNOSIS — F419 Anxiety disorder, unspecified: Secondary | ICD-10-CM | POA: Diagnosis not present

## 2020-05-12 DIAGNOSIS — Z7982 Long term (current) use of aspirin: Secondary | ICD-10-CM | POA: Insufficient documentation

## 2020-05-12 DIAGNOSIS — E1151 Type 2 diabetes mellitus with diabetic peripheral angiopathy without gangrene: Secondary | ICD-10-CM | POA: Insufficient documentation

## 2020-05-12 DIAGNOSIS — Z8673 Personal history of transient ischemic attack (TIA), and cerebral infarction without residual deficits: Secondary | ICD-10-CM | POA: Diagnosis not present

## 2020-05-12 DIAGNOSIS — J440 Chronic obstructive pulmonary disease with acute lower respiratory infection: Secondary | ICD-10-CM | POA: Diagnosis not present

## 2020-05-12 DIAGNOSIS — Z955 Presence of coronary angioplasty implant and graft: Secondary | ICD-10-CM | POA: Diagnosis not present

## 2020-05-12 DIAGNOSIS — I10 Essential (primary) hypertension: Secondary | ICD-10-CM

## 2020-05-12 DIAGNOSIS — E785 Hyperlipidemia, unspecified: Secondary | ICD-10-CM | POA: Insufficient documentation

## 2020-05-12 DIAGNOSIS — R109 Unspecified abdominal pain: Secondary | ICD-10-CM | POA: Diagnosis not present

## 2020-05-12 DIAGNOSIS — I130 Hypertensive heart and chronic kidney disease with heart failure and stage 1 through stage 4 chronic kidney disease, or unspecified chronic kidney disease: Secondary | ICD-10-CM | POA: Insufficient documentation

## 2020-05-12 DIAGNOSIS — I739 Peripheral vascular disease, unspecified: Secondary | ICD-10-CM | POA: Diagnosis not present

## 2020-05-12 DIAGNOSIS — F329 Major depressive disorder, single episode, unspecified: Secondary | ICD-10-CM | POA: Diagnosis not present

## 2020-05-12 DIAGNOSIS — Z794 Long term (current) use of insulin: Secondary | ICD-10-CM | POA: Insufficient documentation

## 2020-05-12 DIAGNOSIS — I5032 Chronic diastolic (congestive) heart failure: Secondary | ICD-10-CM

## 2020-05-12 LAB — BASIC METABOLIC PANEL
Anion gap: 14 (ref 5–15)
BUN: 34 mg/dL — ABNORMAL HIGH (ref 6–20)
CO2: 25 mmol/L (ref 22–32)
Calcium: 9.9 mg/dL (ref 8.9–10.3)
Chloride: 100 mmol/L (ref 98–111)
Creatinine, Ser: 1.95 mg/dL — ABNORMAL HIGH (ref 0.44–1.00)
GFR calc Af Amer: 32 mL/min — ABNORMAL LOW (ref >60)
GFR calc non Af Amer: 27 mL/min — ABNORMAL LOW (ref >60)
Glucose, Bld: 109 mg/dL — ABNORMAL HIGH (ref 70–99)
Potassium: 3.6 mmol/L (ref 3.5–5.1)
Sodium: 139 mmol/L (ref 135–145)

## 2020-05-12 LAB — BRAIN NATRIURETIC PEPTIDE: B Natriuretic Peptide: 49.7 pg/mL (ref 0.0–100.0)

## 2020-05-12 NOTE — Progress Notes (Signed)
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  Several hospitalizations at [facility] for acute on chronic respiratory failure, bilateral pleural effusions and acute diastolic HF (1/66/06-3/0/16 and 10/01/19-10/06/19)  + thoracentesis 10/04/19 of 1 L (patient had had 4 other thoracenteses in the prior year).   Admitted 10/21/19 to [facility] with prolonged hospitalization due to COVID-19 PNA, respiratory failure, A/C diastolic HF and CAD with PCI to LAD and RCA. Also had Cardiomems placed given recurrent diastolic CHF.  She lives at [facility].    She returns today for followup of CHF. She is frustrated because she does not have her prosthesis yet, supposedly coming this week.  She wants more PT, only getting 15 minutes daily.  She wheels her wheelchair everywhere, short of breath wheeling long distances.  Also gets short of breath with anxiety.  No chest pain.  No orthopnea/PND.  No lightheadedness.  BP controlled today. Main complaint is abdominal fullness/tightness after eating.  She has started regurgitating food when she eats.  This has been worsening and she has apparently seen GI with recommendation for EGD.   PADP 7 by Cardiomems on 05/07/20  Labs (3/21): LDL 51 Labs (4/21): K 4.1, creatinine 1.31, BNP 1253, hgb 9    PMH: 1. HTN 2. Hyperlipidemia 3. TIA 4. Type 2 diabetes 5. Carotid stenosis:  - Carotid dopplers (8/21): 60-79% RICA stenosis, stable. 6. PAD: S/p right BKA.  - ABIs (8/21): 0.82 (mild disease) on left.  7. CKD: Stage 3.  8. COPD: On home oxygen 3 L and carries diagnosis of COPD but never smoked.  9. Diabetic neuropathy 10. Renal  artery stenosis:  - Renal artery dopplers (3/21): 1-59% bilateral renal artery stenosis.  11. CAD: LHC (3/21)  30% stenosis proximal LAD prior to D1. 50% stenosis LAD at D1. 80-90% stenosis mid LAD. 75% proximal D1 stenosis, moderate-large vessel.  Long 70% stenosis ostial to proximal LCx. Tiny OM1 with diffuse severe disease. Small OM2 with 99% proximal stenosis. 60% stenosis proximal PLOM. Long area of calcified plaque from the proximal into the mid RCA. 90% stenosis proximally, 80% stenosis in the mid RCA. 80% stenosis mid PDA. 70% stenosis mid PLV. - PCI (3/21): Atherectomy + DES to prox/mid RCA, DES mid LAD.  12. Chronic diastolic CHF: Has Cardiomems device.  - Echo (2/21): EF 55-60% with mild concentric LVH, both mean arterial pressure and LVEDP are elevated.  Mild MR and Mild TR. RV systolic pressure is 48 mmHg  - RHC (3/21): mean RA 2, PA 27/10, PCWP mean 13, CI 3.63 13. H/o transudative pleural effusions.  14. COVID-19 PNA: 2/21.   ROS: All systems negative except as listed in HPI, PMH and Problem List.  SH:  Social History   Socioeconomic History  . Marital status: Unknown    Spouse name: Not on file  . Number of children: Not on file  . Years of education: Not on file  . Highest education level: Not on file  Occupational History  . Not on file  Tobacco Use  . Smoking status: Never Smoker  . Smokeless tobacco: Never Used  Substance and Sexual Activity  . Alcohol use: Never  . Drug use: Never  .  Sexual activity: Not on file  Other Topics Concern  . Not on file  Social History Narrative  . Not on file   Social Determinants of Health   Financial Resource Strain:   . Difficulty of Paying Living Expenses: Not on file  Food Insecurity:   . Worried About Charity fundraiser in the Last Year: Not on file  . Ran Out of Food in the Last Year: Not on file  Transportation Needs: No Transportation Needs  . Lack of Transportation (Medical): No  . Lack of Transportation  (Non-Medical): No  Physical Activity: Inactive  . Days of Exercise per Week: 0 days  . Minutes of Exercise per Session: 0 min  Stress:   . Feeling of Stress : Not on file  Social Connections:   . Frequency of Communication with Friends and Family: Not on file  . Frequency of Social Gatherings with Friends and Family: Not on file  . Attends Religious Services: Not on file  . Active Member of Clubs or Organizations: Not on file  . Attends Archivist Meetings: Not on file  . Marital Status: Not on file  Intimate Partner Violence:   . Fear of Current or Ex-Partner: Not on file  . Emotionally Abused: Not on file  . Physically Abused: Not on file  . Sexually Abused: Not on file    FH:  Family History  Problem Relation Age of Onset  . Hypertension Brother   . Diabetes Brother   . Hypertension Maternal Grandmother   . Heart attack Paternal Grandmother      Current Outpatient Medications  Medication Sig Dispense Refill  . acetaminophen (TYLENOL) 325 MG tablet Take 650 mg by mouth every 6 (six) hours as needed for headache (pain).    Marland Kitchen albuterol (VENTOLIN HFA) 108 (90 Base) MCG/ACT inhaler Inhale 2 puffs into the lungs every 4 (four) hours as needed for wheezing or shortness of breath.    Marland Kitchen amLODipine (NORVASC) 10 MG tablet Take 1 tablet (10 mg total) by mouth daily. 30 tablet 0  . aspirin EC 81 MG EC tablet Take 1 tablet (81 mg total) by mouth daily. 30 tablet 0  . atorvastatin (LIPITOR) 80 MG tablet Take 80 mg by mouth at bedtime.    . busPIRone (BUSPAR) 5 MG tablet Take 5 mg by mouth every morning.    . clonazePAM (KLONOPIN) 0.25 MG disintegrating tablet Take 1 tablet (0.25 mg total) by mouth 2 (two) times daily. 60 tablet 0  . cloNIDine (CATAPRES) 0.3 MG tablet Take 1 tablet (0.3 mg total) by mouth 2 (two) times daily. 60 tablet 0  . clopidogrel (PLAVIX) 75 MG tablet Take 75 mg by mouth daily.    . hydrALAZINE (APRESOLINE) 100 MG tablet Take 100 mg by mouth daily.    .  insulin aspart (NOVOLOG) 100 UNIT/ML injection Inject 0-9 Units into the skin 3 (three) times daily with meals. 10 mL 11  . insulin glargine (LANTUS) 100 UNIT/ML injection Inject 0.2 mLs (20 Units total) into the skin daily. 10 mL 11  . isosorbide mononitrate (IMDUR) 60 MG 24 hr tablet Take 1.5 tablets (90 mg total) by mouth daily. 45 tablet 0  . labetalol (NORMODYNE) 300 MG tablet Take 300 mg by mouth every 12 (twelve) hours.    . metolazone (ZAROXOLYN) 2.5 MG tablet Take 1 tablet (2.5 mg total) by mouth once a week. 1 tab every friday 90 tablet 3  . Multiple Vitamin (MULTIVITAMIN WITH MINERALS) TABS tablet Take  1 tablet by mouth daily. 30 tablet 0  . nitroGLYCERIN (NITROSTAT) 0.4 MG SL tablet Place 0.4 mg under the tongue every 5 (five) minutes as needed for chest pain.    . pantoprazole (PROTONIX) 40 MG tablet Take 40 mg by mouth daily.    . polyethylene glycol (MIRALAX / GLYCOLAX) 17 g packet Take 17 g by mouth daily as needed (constipation).    . potassium chloride (KLOR-CON) 10 MEQ tablet Take 10 mEq by mouth daily.    Marland Kitchen senna (SENOKOT) 8.6 MG TABS tablet Take 2 tablets by mouth at bedtime.    . torsemide (DEMADEX) 20 MG tablet Take 3 tablets (60 mg total) by mouth 2 (two) times daily. 270 tablet 3  . venlafaxine (EFFEXOR) 37.5 MG tablet Take 37.5 mg by mouth daily.     No current facility-administered medications for this encounter.    Vitals:   05/12/20 1339  BP: 110/60  Pulse: 83  SpO2: 100%  Weight: 69.1 kg (152 lb 6.4 oz)   Wt Readings from Last 3 Encounters:  05/12/20 69.1 kg (152 lb 6.4 oz)  01/24/20 66.8 kg (147 lb 3.2 oz)  12/13/19 71.9 kg (158 lb 9.6 oz)    General: NAD Neck: No JVD, no thyromegaly or thyroid nodule.  Lungs: Decreased BS at bases.  CV: Nondisplaced PMI.  Heart regular S1/S2, no S3/S4, no murmur.  No peripheral edema.  No carotid bruit.  Difficult to palpate left pedal pulses.  Abdomen: Soft, nontender, no hepatosplenomegaly, no distention.  Skin:  Intact without lesions or rashes.  Neurologic: Alert and oriented x 3.  Psych: Normal affect. Extremities: No clubbing or cyanosis. S/p right BKA.  HEENT: Normal.    ASSESSMENT & PLAN: 1. Chronic hypoxemic respiratory failure: 09/2019 COVID-19 viral pneumonia. She has been on home oxygen 3L  and said to have COPD but has never smoked.  I think we need further investigation into her lung disease.  - I will arrange for PFTs.  - CXR today to assess for recurrent pleural effusions.   2. Chronic diastolic CHF: Multiple admissions with hypertensive urgency/diastolic CHF. Echo in 2/21 with EF 55-60%, normal RV, IVC suggesting RA pressure 8 mmHg. [place] 3/21 showed optimized filling pressures with normal cardiac output.  With multiple diastolic CHF admissions, we placed a Cardiomems device. NYHA class II, she is not volume overloaded by exam or recent Cardiomems reading.  - Continue  torsemide 60 mg twice a day and 2.5 mg metolazone every Friday. BMET/BNP today.  - I asked her to use Cardiomems at least 3 days/week.  3. Pleural effusions: 09/2019 Had thoracenteses at [facility] and again bilaterally at [facility] in 3/21. Transudates with negative cytology. Suspect due to CHF.  Decreased BS at bases on lung exam today.  - Will order CXR to assess.  4. HTN: BP controlled on current regimen. Renal artery dopplers in 3/21 with nonobstructive disease.  - Continue amlodipine 10 mg daily.  - Continue hydralazine 100 mg tid. - Continue labetalol to 300 mg bid.  - Continue clonidine 0.3 bid.   5. CKD: Stage 3.  Suspect diabetic nephropathy.  - BMET today.  6. Carotid stenosis: 8/21 moderate RICA stenosis.  - Repeat carotids in 8/22.  7. Abdominal pain: Fullness and discomfort after eating, now regurgitating.  She has a hard time eating now.  Possible diabetic gastroparesis versus mesenteric ischemia.  Also consider stricture.  I agree that she needs EGD to evaluate as symptoms are worsening.  It is not ideal to  hold  Plavix at this point, but she is out > 6 months from her PCI.  Current plan is EGD in October.  I think it would be reasonable to hold Plavix 5 days prior to procedure with the understanding that she will be at higher risk off Plavix.  Restart after procedure.  8. DM: Per primary MD.  9. CAD: In 3/21, she had atherectomy/PCI to prox/mid LAD and PCI mid LAD. For now, no intervention on moderate LCx disease. No chest pain.  - Continue Imdur 90 mg daily.  - Continue ASA 81 + Plavix 75.  See above regarding holding Plavix for EGD.  - Continue atorvastatin 80 mg daily. Good LDL in 3/21.   10: PAD: S/p R BKA.  Mildly decreased ABI on left in 8/21.   - Continue statin, ASA, Plavix.  11. Deconditioning: I think that she needs more PT.  I requested that we try to increase to 30 minutes daily.   Followup in 3 months.   Jamie [name] 05/12/2020

## 2020-05-12 NOTE — Patient Instructions (Signed)
Following orders sent back to [place]:   Daily PT x 30 min  Can hold Plavix x 5 days pre-EGD  Labs (bmet, bnp) Done today in our office  Chest X-ray Done today in our office  PFT's needed  Your physician recommends that you schedule a follow-up appointment in: 3 months

## 2020-05-13 ENCOUNTER — Telehealth (HOSPITAL_COMMUNITY): Payer: Self-pay

## 2020-05-13 NOTE — Telephone Encounter (Signed)
-----   Message from Larey Dresser, MD sent at 05/13/2020 11:24 AM EDT ----- Stable, no large effusion

## 2020-05-13 NOTE — Telephone Encounter (Signed)
Malena Edman, RN  05/13/2020 1:10 PM EDT Back to Top    Spoke to patient's husband and advised. He gave me [place] #. Called there and spoke with Nurse Earnest Bailey and advised. Holly verbalized understanding and passed it along to patient

## 2020-05-16 ENCOUNTER — Telehealth (HOSPITAL_COMMUNITY): Payer: Self-pay | Admitting: *Deleted

## 2020-05-16 MED ORDER — TORSEMIDE 20 MG PO TABS: ORAL_TABLET | ORAL | 3 refills | Status: DC

## 2020-05-16 NOTE — Telephone Encounter (Signed)
Marsh [place], RN  05/16/2020 10:14 AM EDT Back to [facility] and spoke w/Samantha, RN, gave her VO, she repeated back to me for verification and advised they can do the bmet there next week, med list updated   Shonna Chock, [place]  05/14/2020 10:26 AM EDT     [facility]   Philicia R Branch, CMA  05/13/2020 2:28 PM EDT     [facility]   Larey Dresser, MD  05/13/2020 11:24 AM EDT     Stop metolazone. Hold torsemide for 1 day, then decrease to 60 qam/40 qpm. BMET in 1 week.

## 2020-05-21 ENCOUNTER — Encounter (HOSPITAL_COMMUNITY): Payer: Self-pay

## 2020-05-21 ENCOUNTER — Other Ambulatory Visit: Payer: Self-pay

## 2020-05-21 ENCOUNTER — Emergency Department (HOSPITAL_COMMUNITY)
Admission: EM | Admit: 2020-05-21 | Discharge: 2020-05-21 | Disposition: A | Discharge status: Home / Self Care | Payer: Medicaid Other | Attending: Emergency Medicine | Admitting: Emergency Medicine

## 2020-05-21 ENCOUNTER — Encounter (HOSPITAL_COMMUNITY): Payer: Medicaid Other

## 2020-05-21 ENCOUNTER — Ambulatory Visit (HOSPITAL_COMMUNITY)
Admission: RE | Admit: 2020-05-21 | Discharge: 2020-05-21 | Disposition: A | Discharge status: Home / Self Care | Payer: Medicaid Other | Source: Ambulatory Visit | Attending: Cardiology | Admitting: Cardiology

## 2020-05-21 DIAGNOSIS — I5033 Acute on chronic diastolic (congestive) heart failure: Secondary | ICD-10-CM | POA: Diagnosis not present

## 2020-05-21 DIAGNOSIS — R42 Dizziness and giddiness: Secondary | ICD-10-CM | POA: Diagnosis present

## 2020-05-21 DIAGNOSIS — Z9861 Coronary angioplasty status: Secondary | ICD-10-CM | POA: Insufficient documentation

## 2020-05-21 DIAGNOSIS — Z79899 Other long term (current) drug therapy: Secondary | ICD-10-CM | POA: Diagnosis not present

## 2020-05-21 DIAGNOSIS — J449 Chronic obstructive pulmonary disease, unspecified: Secondary | ICD-10-CM | POA: Insufficient documentation

## 2020-05-21 DIAGNOSIS — Z7982 Long term (current) use of aspirin: Secondary | ICD-10-CM | POA: Diagnosis not present

## 2020-05-21 DIAGNOSIS — E1159 Type 2 diabetes mellitus with other circulatory complications: Secondary | ICD-10-CM | POA: Diagnosis not present

## 2020-05-21 DIAGNOSIS — Z7951 Long term (current) use of inhaled steroids: Secondary | ICD-10-CM | POA: Insufficient documentation

## 2020-05-21 DIAGNOSIS — F17200 Nicotine dependence, unspecified, uncomplicated: Secondary | ICD-10-CM | POA: Diagnosis not present

## 2020-05-21 DIAGNOSIS — I110 Hypertensive heart disease with heart failure: Secondary | ICD-10-CM | POA: Diagnosis not present

## 2020-05-21 DIAGNOSIS — I5032 Chronic diastolic (congestive) heart failure: Secondary | ICD-10-CM

## 2020-05-21 DIAGNOSIS — Z794 Long term (current) use of insulin: Secondary | ICD-10-CM | POA: Insufficient documentation

## 2020-05-21 MED ORDER — MECLIZINE HCL 50 MG PO TABS: 50.0000 mg | ORAL_TABLET | Freq: Three times a day (TID) | ORAL | 0 refills | Status: DC | PRN

## 2020-05-21 MED ORDER — ALBUTEROL SULFATE (2.5 MG/3ML) 0.083% IN NEBU: 2.5000 mg | INHALATION_SOLUTION | Freq: Once | RESPIRATORY_TRACT | Status: DC

## 2020-05-21 MED ORDER — MECLIZINE HCL 25 MG PO TABS
25.0000 mg | ORAL_TABLET | Freq: Once | ORAL | Status: AC
  Administered 2020-05-21: 25 mg via ORAL
  Filled 2020-05-21: qty 1

## 2020-05-21 NOTE — ED Notes (Signed)
Caregiver at [facility] to inquire regarding pts transportation back to facility. This RN will remain informed.

## 2020-05-21 NOTE — ED Provider Notes (Signed)
[facility] EMERGENCY DEPARTMENT Provider Note   CSN: [id number] Arrival date & time: 05/21/20  1435     History Chief Complaint  Patient presents with  . Dizziness  . Near Syncope    Jamie [name] is a 59 y.o. female.  59 yo F with a cc of dizziness.  Patient states it is a sensation like she feels like the room is spinning.  She denies feeling like she might of passed out.  States that she was at a pulmonary function test today and she was told to sit up and when she did she felt suddenly very dizzy.  She has had this problem for some time.  She estimates she had it for at least the past 6 months.  She usually gets some discomfort to her neck when it happens.  She has been to see an ENT and there is some concern that she may need a procedure down the line.  She takes meclizine chronically but thinks that she ran out.  Had a dose earlier today.  She thinks because she missed her dose at lunch that her symptoms have gotten worse when she sat up.  She is on a restricted fluid diet due to diastolic heart failure.  Has had recurrent pleural effusions that required drainage.  She is somewhat fearful of this and states that she is very low on her fluid intake.  She denies chest pain or shortness of breath denies abdominal pain denies nausea vomiting or diarrhea.  Denies any recent medication change.  She states she had lab work performed just last week that was unremarkable.  She feels back to her baseline now.  The history is provided by the patient.  Dizziness Quality:  Head spinning Severity:  Severe Onset quality:  Sudden Duration:  1 hour Timing:  Rare Progression:  Resolved Chronicity:  New Relieved by:  Lying down Worsened by:  Sitting upright Ineffective treatments:  None tried Associated symptoms: no chest pain, no headaches, no nausea, no palpitations, no shortness of breath and no vomiting   Near Syncope Pertinent negatives include no chest pain, no headaches and  no shortness of breath.       Past Medical History:  Diagnosis Date  . 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 Chronic diastolic HF (heart failure) ([place]) 10/21/2019  . COPD (chronic obstructive pulmonary disease) ([place])   . Diabetes type 2, uncontrolled ([place])   . Gastritis and duodenitis   . HTN (hypertension) 10/21/2019  . PAD (peripheral artery disease) ([place]) 10/21/2019  . PAD (peripheral artery disease) ([place])   . PNA (pneumonia)   . TIA (transient ischemic attack)     Patient Active Problem List   Diagnosis Date Noted  . Palliative care by specialist   . Acute renal failure ([place])   . DNR (do not resuscitate) discussion   . Acute respiratory insufficiency   . Chronic diastolic HF (heart failure) ([place]) 10/21/2019  . HTN (hypertension) 10/21/2019  . PAD (peripheral artery disease) ([place]) 10/21/2019  . CHF (congestive heart failure) ([place]) 10/21/2019  . Acute on chronic diastolic CHF (congestive heart failure) ([place])     Past Surgical History:  Procedure Laterality Date  . BELOW KNEE LEG AMPUTATION Right   . CORONARY ATHERECTOMY N/A 11/02/2019   Procedure: CORONARY ATHERECTOMY;  Surgeon: Martinique, Peter M, MD;  Location: [facility];  Service: Cardiovascular;  Laterality: N/A;  . CORONARY STENT INTERVENTION N/A 11/02/2019   Procedure: CORONARY STENT INTERVENTION;  Surgeon:  Martinique, Peter M, MD;  Location: [facility];  Service: Cardiovascular;  Laterality: N/A;  . PRESSURE SENSOR/CARDIOMEMS N/A 10/31/2019   Procedure: PRESSURE SENSOR/CARDIOMEMS;  Surgeon: Larey Dresser, MD;  Location: [facility];  Service: Cardiovascular;  Laterality: N/A;  . RIGHT/LEFT HEART CATH AND CORONARY ANGIOGRAPHY N/A 10/31/2019   Procedure: RIGHT/LEFT HEART CATH AND CORONARY ANGIOGRAPHY;  Surgeon: Larey Dresser, MD;  Location: [facility];  Service: Cardiovascular;  Laterality: N/A;     OB History   No obstetric history on file.     Family History  Problem Relation Age of  Onset  . Hypertension Brother   . Diabetes Brother   . Hypertension Maternal Grandmother   . Heart attack Paternal Grandmother     Social History   Tobacco Use  . Smoking status: Never Smoker  . Smokeless tobacco: Never Used  Substance Use Topics  . Alcohol use: Never  . Drug use: Never    Home Medications Prior to Admission medications   Medication Sig Start Date End Date Taking? Authorizing Provider  acetaminophen (TYLENOL) 325 MG tablet Take 650 mg by mouth every 6 (six) hours as needed for headache (pain).    [provider]  albuterol (VENTOLIN HFA) 108 (90 Base) MCG/ACT inhaler Inhale 2 puffs into the lungs every 4 (four) hours as needed for wheezing or shortness of breath.    [provider]  amLODipine (NORVASC) 10 MG tablet Take 1 tablet (10 mg total) by mouth daily. 11/07/19   [place] Allan I, MD  aspirin EC 81 MG EC tablet Take 1 tablet (81 mg total) by mouth daily. 11/07/19   Bonnell Public, MD  atorvastatin (LIPITOR) 80 MG tablet Take 80 mg by mouth at bedtime.    [provider]  busPIRone (BUSPAR) 5 MG tablet Take 5 mg by mouth every morning.    [provider]  clonazePAM (KLONOPIN) 0.25 MG disintegrating tablet Take 1 tablet (0.25 mg total) by mouth 2 (two) times daily. 11/06/19   [place] Allan I, MD  cloNIDine (CATAPRES) 0.3 MG tablet Take 1 tablet (0.3 mg total) by mouth 2 (two) times daily. 11/06/19   [place] Allan I, MD  clopidogrel (PLAVIX) 75 MG tablet Take 75 mg by mouth daily.    [provider]  hydrALAZINE (APRESOLINE) 100 MG tablet Take 100 mg by mouth daily.    [provider]  insulin aspart (NOVOLOG) 100 UNIT/ML injection Inject 0-9 Units into the skin 3 (three) times daily with meals. 11/06/19   [place] Allan I, MD  insulin glargine (LANTUS) 100 UNIT/ML injection Inject 0.2 mLs (20 Units total) into the skin daily. 11/07/19   Bonnell Public, MD  isosorbide mononitrate (IMDUR) 60  MG 24 hr tablet Take 1.5 tablets (90 mg total) by mouth daily. 11/13/19   Clegg, Amy D, NP  labetalol (NORMODYNE) 300 MG tablet Take 300 mg by mouth every 12 (twelve) hours.    [provider]  meclizine (ANTIVERT) 50 MG tablet Take 1 tablet (50 mg total) by mouth 3 (three) times daily as needed. 05/21/20   Deno Etienne, DO  Multiple Vitamin (MULTIVITAMIN WITH MINERALS) TABS tablet Take 1 tablet by mouth daily. 11/07/19   Bonnell Public, MD  nitroGLYCERIN (NITROSTAT) 0.4 MG SL tablet Place 0.4 mg under the tongue every 5 (five) minutes as needed for chest pain.    [provider]  pantoprazole (PROTONIX) 40 MG tablet Take 40 mg by mouth daily.  [provider]  polyethylene glycol (MIRALAX / GLYCOLAX) 17 g packet Take 17 g by mouth daily as needed (constipation).    [provider]  potassium chloride (KLOR-CON) 10 MEQ tablet Take 10 mEq by mouth daily.    [provider]  senna (SENOKOT) 8.6 MG TABS tablet Take 2 tablets by mouth at bedtime.    [provider]  torsemide (DEMADEX) 20 MG tablet Take 3 tablets (60 mg total) by mouth in the morning AND 2 tablets (40 mg total) every evening. 05/16/20   Larey Dresser, MD  venlafaxine (EFFEXOR) 37.5 MG tablet Take 37.5 mg by mouth daily.    [provider]    Allergies    Codeine, Meperidine and related, and Tetracyclines & related  Review of Systems   Review of Systems  Constitutional: Negative for chills and fever.  HENT: Negative for congestion and rhinorrhea.   Eyes: Negative for redness and visual disturbance.  Respiratory: Negative for shortness of breath and wheezing.   Cardiovascular: Positive for near-syncope. Negative for chest pain and palpitations.  Gastrointestinal: Negative for nausea and vomiting.  Genitourinary: Negative for dysuria and urgency.  Musculoskeletal: Negative for arthralgias and myalgias.  Skin: Negative for pallor and wound.  Neurological: Positive  for dizziness. Negative for headaches.    Physical Exam Updated Vital Signs BP 140/63   Pulse 77   Temp 97.7 F (36.5 C) (Oral)   Resp 13   Ht 5\' 3"  (1.6 m)   Wt 68.5 kg   SpO2 100%   BMI 26.75 kg/m   Physical Exam Vitals and nursing note reviewed.  Constitutional:      General: She is not in acute distress.    Appearance: She is well-developed. She is not diaphoretic.  HENT:     Head: Normocephalic and atraumatic.  Eyes:     Pupils: Pupils are equal, round, and reactive to light.  Cardiovascular:     Rate and Rhythm: Normal rate and regular rhythm.     Heart sounds: No murmur heard.  No friction rub. No gallop.   Pulmonary:     Effort: Pulmonary effort is normal.     Breath sounds: No wheezing or rales.  Abdominal:     General: There is no distension.     Palpations: Abdomen is soft.     Tenderness: There is no abdominal tenderness.  Musculoskeletal:        General: No tenderness.     Cervical back: Normal range of motion and neck supple.  Skin:    General: Skin is warm and dry.  Neurological:     Mental Status: She is alert and oriented to person, place, and time.  Psychiatric:        Behavior: Behavior normal.     ED Results / Procedures / Treatments   Labs (all labs ordered are listed, but only abnormal results are displayed) Labs Reviewed - No data to display  EKG None  Radiology No results found.  Procedures Procedures (including critical care time)  Medications Ordered in ED Medications  meclizine (ANTIVERT) tablet 25 mg (has no administration in time range)    ED Course  I have reviewed the triage vital signs and the nursing notes.  Pertinent labs & imaging results that were available during my care of the patient were reviewed by me and considered in my medical decision making (see chart for details).    MDM Rules/Calculators/A&P  59 yo F with multiple medical problems including diastolic heart failure with  recurrent pleural effusions requiring drainage a right BKA post infection.  She tells me as soon as I walk into the room that she would like to go home.  She does not want any testing performed.  States that she just had lab work done last week.  She has had this problem for 6 months.  Thinks that she has run out of her meclizine and that is why her symptoms were much worse.  Could be orthostasis based on her history.  She has a very tight fluid restriction that she is fearful of going over.  I offered to give her IV fluids and perform laboratory testing which she is declining.  She would like a dose of meclizine to be discharged home.  Her EKG has some changes from prior though she is not endorsing any chest pain or shortness of breath.  The event lasted for short period of time is completely resolved.  She is electing to follow-up with her family doctor in the office.  3:26 PM:  I have discussed the diagnosis/risks/treatment options with the patient and believe the pt to be eligible for discharge home to follow-up with PCP. We also discussed returning to the ED immediately if new or worsening sx occur. We discussed the sx which are most concerning (e.g., sudden worsening pain, fever, inability to tolerate by mouth) that necessitate immediate return. Medications administered to the patient during their visit and any new prescriptions provided to the patient are listed below.  Medications given during this visit Medications  meclizine (ANTIVERT) tablet 25 mg (has no administration in time range)     The patient appears reasonably screen and/or stabilized for discharge and I doubt any other medical condition or other [facility] requiring further screening, evaluation, or treatment in the ED at this time prior to discharge.   Final Clinical Impression(s) / ED Diagnoses Final diagnoses:  Dizziness    Rx / DC Orders ED Discharge Orders         Ordered    meclizine (ANTIVERT) 50 MG tablet  3 times daily PRN         05/21/20 Garner, Latrel Szymczak, DO 05/21/20 1526

## 2020-05-21 NOTE — ED Triage Notes (Signed)
Pt here for from [facility]. Dropped off by facility for pulmonary function test today, rapid response nurse was called to eval dizziness and near syncopal episode. Pt also having chest pain with radiation to both shoulders with associated nausea. Staff attempted to place pt in a wheelchair to go to triage, but pt continually falling forward and not able to stay awake. Normally A/O x 4.

## 2020-05-21 NOTE — ED Notes (Signed)
Patient verbalizes understanding of discharge instructions. Opportunity for questioning and answers were provided. Pt is now awaiting transport back to facility.

## 2020-05-21 NOTE — Discharge Instructions (Signed)
Return for worsening symptoms, chest pain, shortness of breath or any time you would like to be reevaluated

## 2020-05-21 NOTE — Progress Notes (Signed)
Rt called to help assist with patient in PFT lab. When I arrived, she was slumped over to her left in the PFT box, responded to commands. Placed on pulse ox, SAT 98% on her normal 2L. Started to pass out again so I grabbed Rapid Response. Patient initially declined going to ED, but then decided she would go. Placed on stretcher by myself and 2 others and transported to ED. Patient's wheel chair, o2 tank, pillow and water bottle left with patient in ED.

## 2020-05-30 ENCOUNTER — Other Ambulatory Visit (HOSPITAL_COMMUNITY): Payer: Self-pay | Admitting: Cardiology

## 2020-06-09 ENCOUNTER — Inpatient Hospital Stay (HOSPITAL_COMMUNITY): Admission: RE | Admit: 2020-06-09 | Payer: Medicaid Other | Source: Ambulatory Visit

## 2020-06-20 ENCOUNTER — Other Ambulatory Visit: Payer: Self-pay

## 2020-06-20 ENCOUNTER — Other Ambulatory Visit: Payer: Self-pay | Admitting: Internal Medicine

## 2020-06-20 ENCOUNTER — Ambulatory Visit
Admission: RE | Admit: 2020-06-20 | Discharge: 2020-06-20 | Disposition: A | Discharge status: Home / Self Care | Payer: Medicaid Other | Source: Ambulatory Visit | Attending: Internal Medicine | Admitting: Internal Medicine

## 2020-06-20 DIAGNOSIS — Z13820 Encounter for screening for osteoporosis: Secondary | ICD-10-CM

## 2020-07-10 IMAGING — MR MR ABDOMEN W/O CM
10 series · 48 of 48 positions shown · non-contrast
Comparison: No prior abdominal MRI. CT the abdomen and pelvis
10/22/2019.

CLINICAL DATA: 58-year-old female with history of liver lesion.
Follow-up study.

EXAM:
MRI ABDOMEN WITHOUT CONTRAST
TECHNIQUE: Multiplanar multisequence MR imaging was performed without the
administration of intravenous contrast.

[Series 6: T2 fat-sat · axial · 6.0mm · 1.19mm/px · z∈[-318,-66]mm · 3 of 36 slices shown]
[im 1/36]
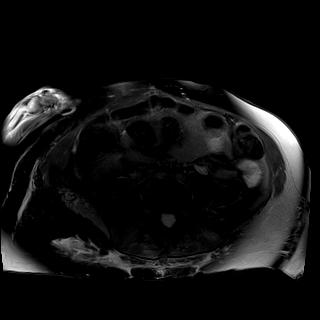
[im 18/36]
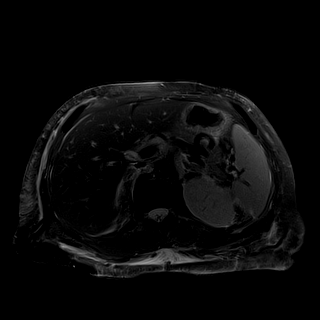
[im 36/36]
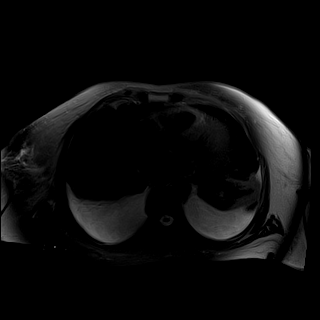

[Series 7: t1_vibe_opp-in_tra_p4_fb · axial · 3.0mm · 1.19mm/px · z∈[-335,-74]mm · 6 of 88 slices shown (1 of 4)]
[im 1/88]
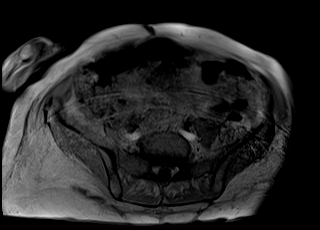
[im 18/88]
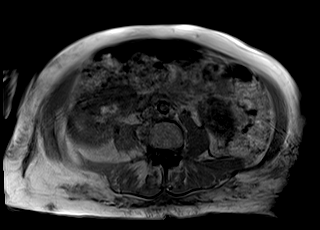
[im 35/88]
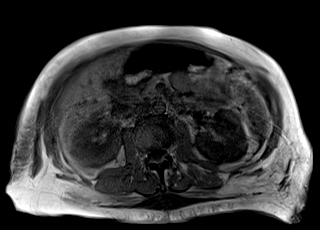
[im 53/88]
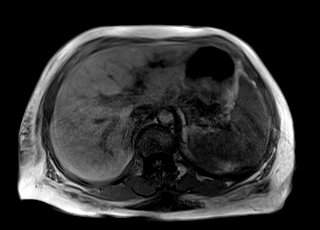
[im 70/88]
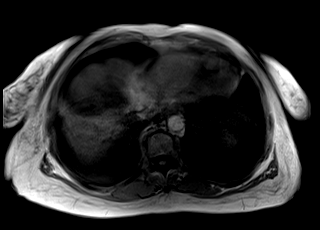
[im 88/88]
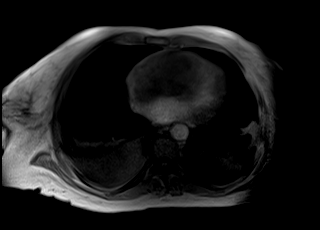

[Series 7: t1_vibe_opp-in_tra_p4_fb · axial · 3.0mm · 1.19mm/px · z∈[-335,-74]mm · 6 of 88 slices shown (2 of 4)]
[im 1/88]
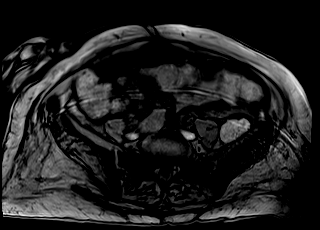
[im 18/88]
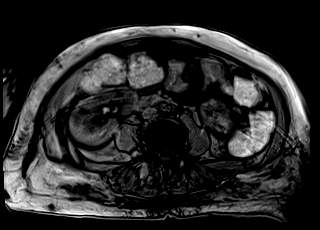
[im 35/88]
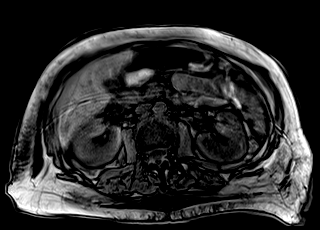
[im 53/88]
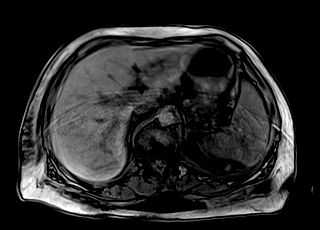
[im 70/88]
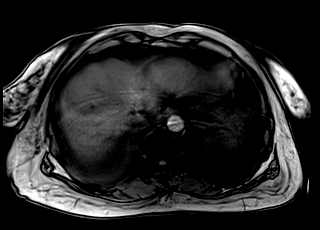
[im 88/88]
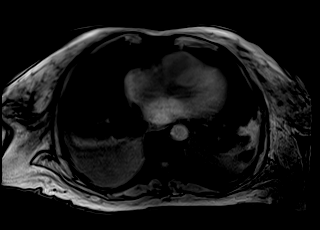

[Series 8: DWI · axial · 6.0mm · 1.42mm/px · z∈[-331,-79]mm · 8 of 108 slices shown (1 of 2)]
[im 1/108]
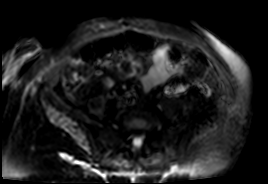
[im 16/108]
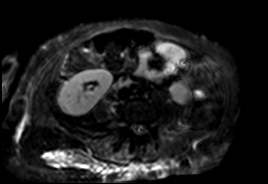
[im 31/108]
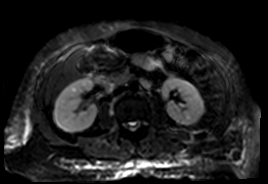
[im 46/108]
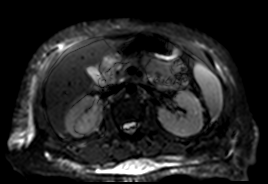
[im 62/108]
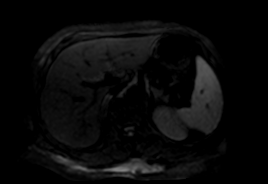
[im 77/108]
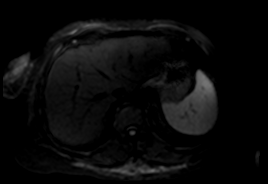
[im 92/108]
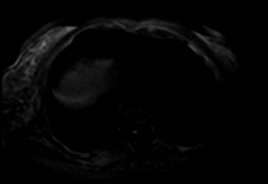
[im 108/108]
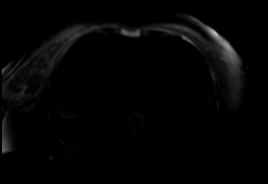

[Series 9: DWI · axial · 6.0mm · 1.42mm/px · z∈[-331,-79]mm · 3 of 36 slices shown (2 of 2)]
[im 1/36]
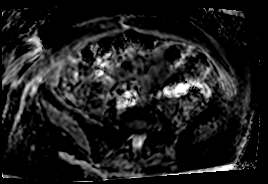
[im 18/36]
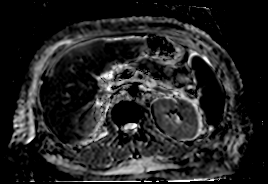
[im 36/36]
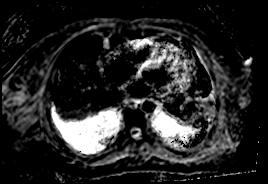

[Series 10: bSSFP · axial · 6.0mm · 0.74mm/px · z∈[-350,-70]mm · 3 of 40 slices shown]
[im 1/40]
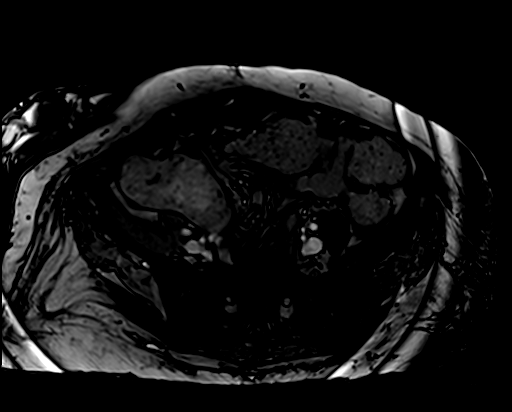
[im 20/40]
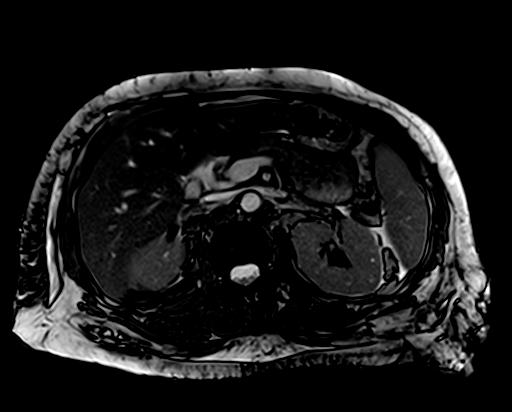
[im 40/40]
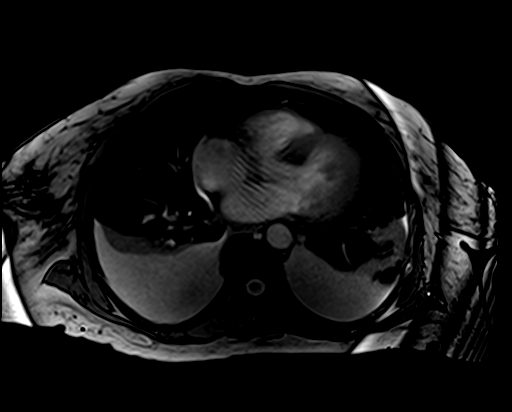

[Series 11: t1_vibe_fs_tra_p4_bh_pre · axial · 3.0mm · 1.19mm/px · z∈[-312,-99]mm · 5 of 72 slices shown]
[im 1/72]
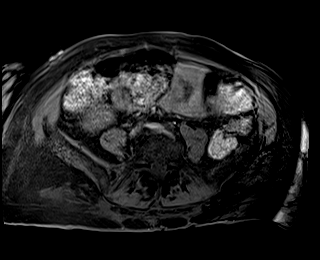
[im 18/72]
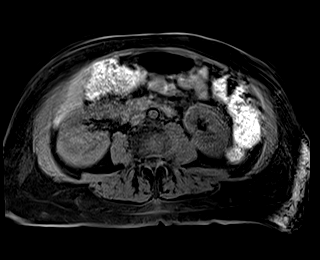
[im 36/72]
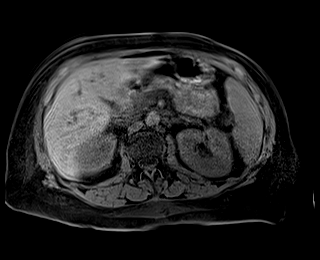
[im 54/72]
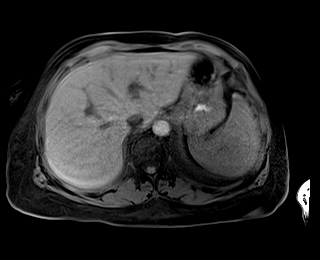
[im 72/72]
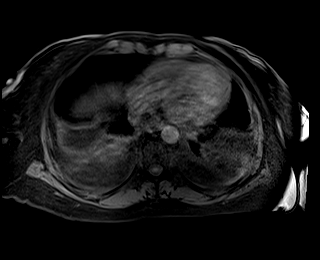

[Series 14: cor haste trig · coronal · 6.0mm · 1.19mm/px · 2 of 32 slices shown]
[im 1/32]
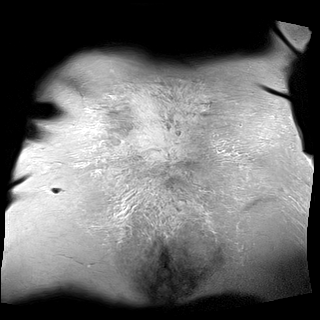
[im 32/32]
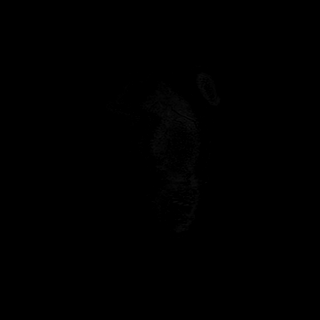

[Series 15: t1_vibe_opp-in_tra_p4_fb · axial · 3.0mm · 1.19mm/px · z∈[-335,-74]mm · 6 of 88 slices shown (3 of 4)]
[im 1/88]
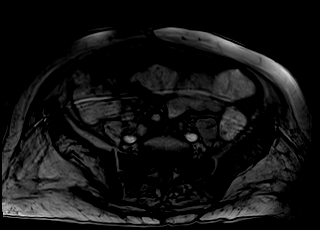
[im 18/88]
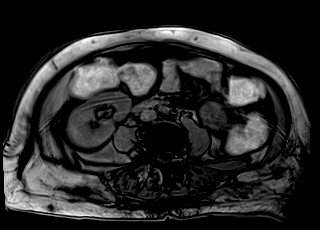
[im 35/88]
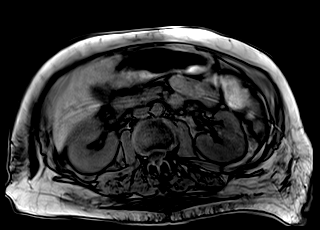
[im 53/88]
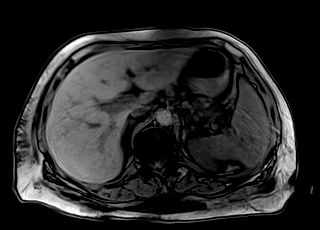
[im 70/88]
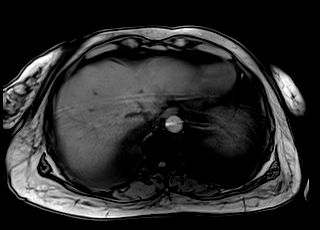
[im 88/88]
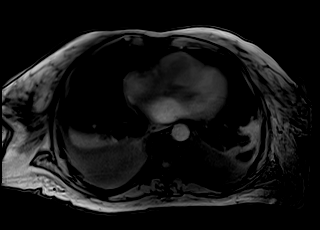

[Series 15: t1_vibe_opp-in_tra_p4_fb · axial · 3.0mm · 1.19mm/px · z∈[-335,-74]mm · 6 of 88 slices shown (4 of 4)]
[im 1/88]
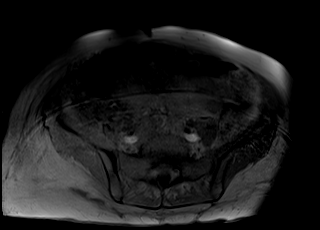
[im 18/88]
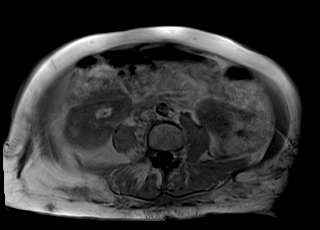
[im 35/88]
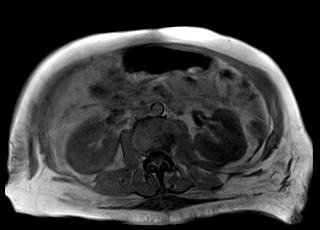
[im 53/88]
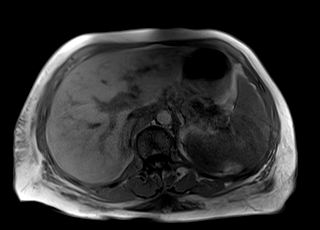
[im 70/88]
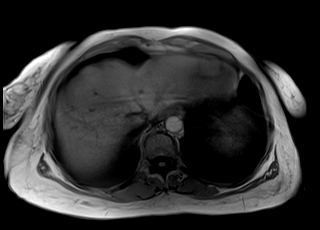
[im 88/88]
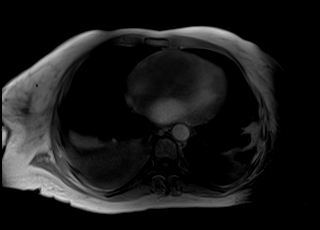

[48 of 48 positions shown; findings below may reference images not displayed]

FINDINGS: Comment: Today's study is limited by lack of IV gadolinium for
detection and characterization of visceral and/or vascular lesions.

Lower chest: Large right and moderate left pleural effusions. Signal
in the visualize lung bases dependently, nonspecific, but favored to
reflect areas of passive atelectasis.

Hepatobiliary: No definite suspicious cystic or solid hepatic
lesions are confidently identified on today's noncontrast
examination. No intra or extrahepatic biliary ductal dilatation.
Gallbladder is normal in appearance.

Pancreas: No definite pancreatic mass or peripancreatic fluid
collections or inflammatory changes noted on today's noncontrast CT
examination.

Spleen:  Unremarkable.

Adrenals/Urinary Tract: Unenhanced appearance of the kidneys and
bilateral adrenal glands are normal.

Stomach/Bowel: Visualized portions are unremarkable.

Vascular/Lymphatic: No aneurysm identified in the visualized
abdominal vasculature. No lymphadenopathy noted in the abdomen.

Other: No significant volume of ascites noted in the visualized
portions of the peritoneal cavity.

Musculoskeletal: No aggressive appearing osseous lesions are noted
in the visualized portions of the skeleton. Mild diffuse body wall
edema.
IMPRESSION: 1. The lesion of concern on the prior CT scan is not demonstrated on
today's noncontrast MRI examination. This may suggest a benign
perfusion anomaly on the prior CT scan. Regardless, this lesion is
unlikely to be related to the patient's history of abdominal pain.
If there is persistent clinical concern, this lesion could be
further evaluated with repeat abdominal MRI with and without IV
gadolinium.
2. Large right and moderate left pleural effusions with areas of
probable passive atelectasis in the dependent portions of the lung
bases bilaterally.
3. Mild diffuse body wall edema.

## 2020-07-11 IMAGING — DX DG CHEST 1V PORT
1 series · 1 of 1 positions shown · non-contrast
Comparison: 10/26/2019

CLINICAL DATA: Shortness of breath, COVID positive

EXAM:
PORTABLE CHEST 1 VIEW

[chest ap]
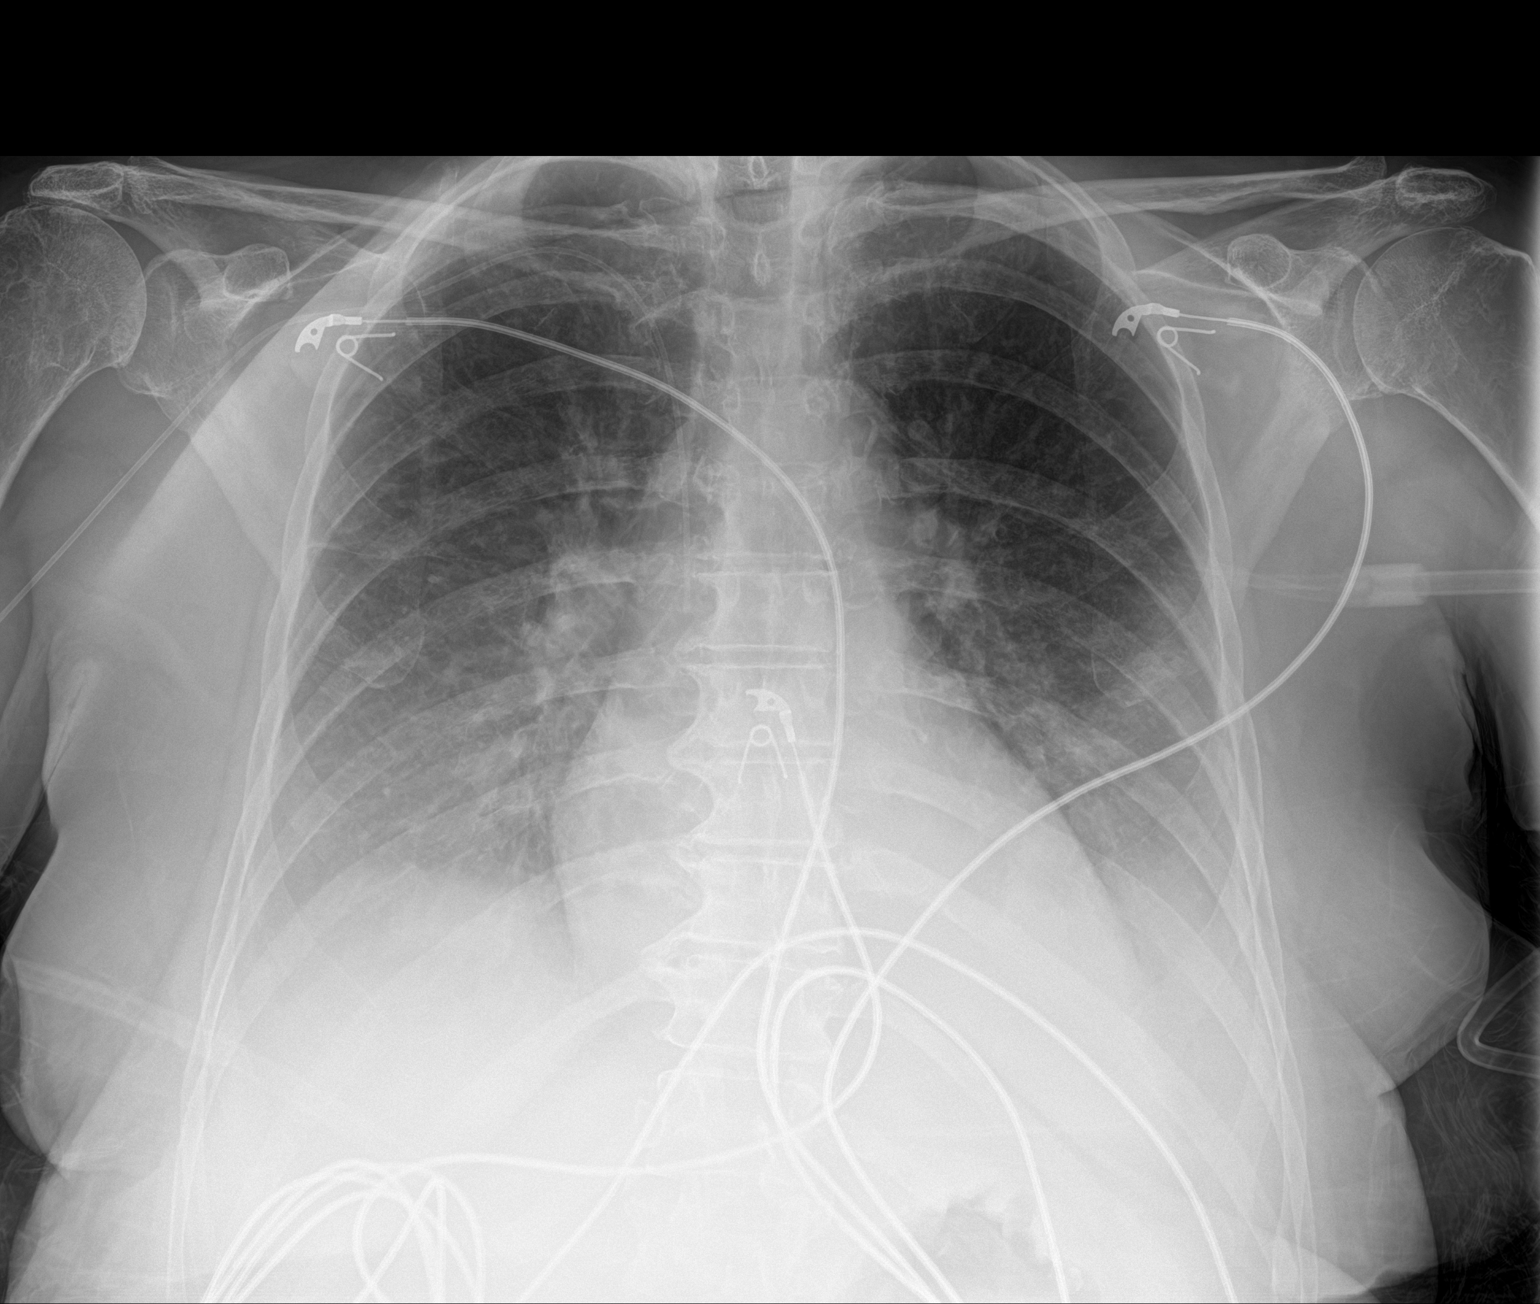

[1 of 1 positions shown; findings below may reference images not displayed]

FINDINGS: Increased density at the lung bases. No pneumothorax. Stable
cardiomediastinal contours. Right PICC line is again noted.
IMPRESSION: Increased density at the lung bases probably reflecting a
combination of pleural effusion and atelectasis.

## 2020-07-14 IMAGING — DX DG CHEST 1V PORT
1 series · 1 of 1 positions shown · non-contrast
Comparison: 10/28/2019

CLINICAL DATA: Shortness of breath.

EXAM:
PORTABLE CHEST 1 VIEW

[chest]
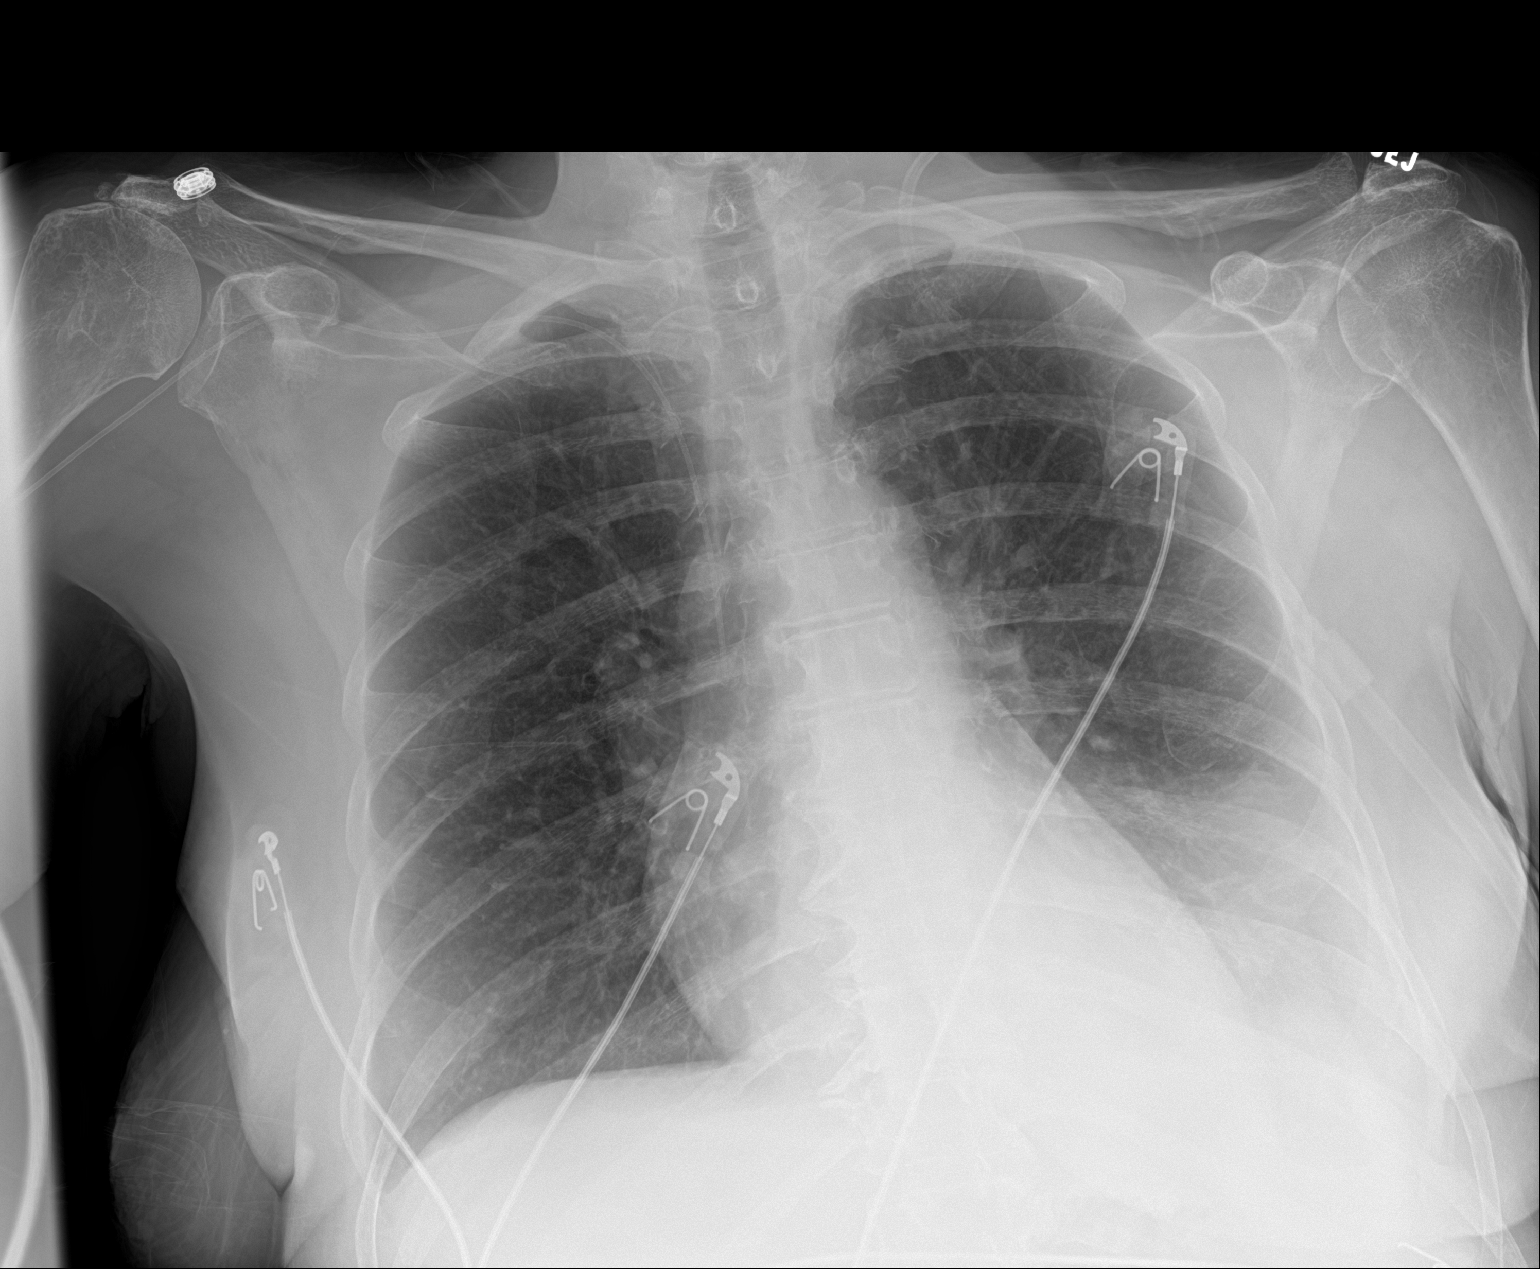

[1 of 1 positions shown; findings below may reference images not displayed]

FINDINGS: The right PICC line is stable.

The cardiac silhouette, mediastinal and hilar contours are within
normal limits and unchanged.

Overall improved lung aeration with resolving edema, atelectasis and
effusions.
IMPRESSION: Improving lung aeration with resolving edema, atelectasis and
effusions.

## 2020-08-18 ENCOUNTER — Encounter (HOSPITAL_COMMUNITY): Payer: Medicaid Other | Admitting: Cardiology

## 2020-10-15 ENCOUNTER — Encounter (HOSPITAL_COMMUNITY): Payer: Medicaid Other | Admitting: Cardiology

## 2020-12-15 ENCOUNTER — Other Ambulatory Visit: Payer: Self-pay

## 2020-12-15 ENCOUNTER — Encounter (HOSPITAL_COMMUNITY): Payer: Self-pay | Admitting: Cardiology

## 2020-12-15 ENCOUNTER — Ambulatory Visit (HOSPITAL_COMMUNITY)
Admission: RE | Admit: 2020-12-15 | Discharge: 2020-12-15 | Disposition: A | Discharge status: Home / Self Care | Payer: Medicaid Other | Source: Ambulatory Visit | Attending: Cardiology | Admitting: Cardiology

## 2020-12-15 VITALS — BP 104/60 | HR 74 | Wt 170.6 lb

## 2020-12-15 DIAGNOSIS — K219 Gastro-esophageal reflux disease without esophagitis: Secondary | ICD-10-CM | POA: Insufficient documentation

## 2020-12-15 DIAGNOSIS — E1140 Type 2 diabetes mellitus with diabetic neuropathy, unspecified: Secondary | ICD-10-CM | POA: Insufficient documentation

## 2020-12-15 DIAGNOSIS — E7800 Pure hypercholesterolemia, unspecified: Secondary | ICD-10-CM | POA: Diagnosis not present

## 2020-12-15 DIAGNOSIS — Z7982 Long term (current) use of aspirin: Secondary | ICD-10-CM | POA: Insufficient documentation

## 2020-12-15 DIAGNOSIS — I130 Hypertensive heart and chronic kidney disease with heart failure and stage 1 through stage 4 chronic kidney disease, or unspecified chronic kidney disease: Secondary | ICD-10-CM | POA: Diagnosis not present

## 2020-12-15 DIAGNOSIS — Z8616 Personal history of COVID-19: Secondary | ICD-10-CM | POA: Insufficient documentation

## 2020-12-15 DIAGNOSIS — Z794 Long term (current) use of insulin: Secondary | ICD-10-CM | POA: Insufficient documentation

## 2020-12-15 DIAGNOSIS — Z89511 Acquired absence of right leg below knee: Secondary | ICD-10-CM | POA: Diagnosis not present

## 2020-12-15 DIAGNOSIS — Z8249 Family history of ischemic heart disease and other diseases of the circulatory system: Secondary | ICD-10-CM | POA: Insufficient documentation

## 2020-12-15 DIAGNOSIS — Z955 Presence of coronary angioplasty implant and graft: Secondary | ICD-10-CM | POA: Insufficient documentation

## 2020-12-15 DIAGNOSIS — I739 Peripheral vascular disease, unspecified: Secondary | ICD-10-CM | POA: Diagnosis not present

## 2020-12-15 DIAGNOSIS — Z79899 Other long term (current) drug therapy: Secondary | ICD-10-CM | POA: Diagnosis not present

## 2020-12-15 DIAGNOSIS — I5032 Chronic diastolic (congestive) heart failure: Secondary | ICD-10-CM | POA: Insufficient documentation

## 2020-12-15 DIAGNOSIS — Z7902 Long term (current) use of antithrombotics/antiplatelets: Secondary | ICD-10-CM | POA: Diagnosis not present

## 2020-12-15 DIAGNOSIS — J9611 Chronic respiratory failure with hypoxia: Secondary | ICD-10-CM | POA: Diagnosis not present

## 2020-12-15 DIAGNOSIS — Z9981 Dependence on supplemental oxygen: Secondary | ICD-10-CM | POA: Insufficient documentation

## 2020-12-15 DIAGNOSIS — I2510 Atherosclerotic heart disease of native coronary artery without angina pectoris: Secondary | ICD-10-CM | POA: Diagnosis not present

## 2020-12-15 DIAGNOSIS — J449 Chronic obstructive pulmonary disease, unspecified: Secondary | ICD-10-CM | POA: Diagnosis not present

## 2020-12-15 DIAGNOSIS — R1310 Dysphagia, unspecified: Secondary | ICD-10-CM | POA: Diagnosis not present

## 2020-12-15 DIAGNOSIS — E1122 Type 2 diabetes mellitus with diabetic chronic kidney disease: Secondary | ICD-10-CM | POA: Insufficient documentation

## 2020-12-15 DIAGNOSIS — N183 Chronic kidney disease, stage 3 unspecified: Secondary | ICD-10-CM | POA: Insufficient documentation

## 2020-12-15 DIAGNOSIS — E785 Hyperlipidemia, unspecified: Secondary | ICD-10-CM | POA: Insufficient documentation

## 2020-12-15 HISTORY — DX: Heart failure, unspecified: I50.9

## 2020-12-15 LAB — BASIC METABOLIC PANEL
Anion gap: 11 (ref 5–15)
BUN: 84 mg/dL — ABNORMAL HIGH (ref 6–20)
CO2: 27 mmol/L (ref 22–32)
Calcium: 9 mg/dL (ref 8.9–10.3)
Chloride: 94 mmol/L — ABNORMAL LOW (ref 98–111)
Creatinine, Ser: 1.97 mg/dL — ABNORMAL HIGH (ref 0.44–1.00)
GFR, Estimated: 29 mL/min — ABNORMAL LOW (ref >60)
Glucose, Bld: 346 mg/dL — ABNORMAL HIGH (ref 70–99)
Potassium: 3.6 mmol/L (ref 3.5–5.1)
Sodium: 132 mmol/L — ABNORMAL LOW (ref 135–145)

## 2020-12-15 LAB — LIPID PANEL
Cholesterol: 189 mg/dL (ref 0–200)
HDL: 24 mg/dL — ABNORMAL LOW (ref >40)
LDL Cholesterol: 117 mg/dL — ABNORMAL HIGH (ref 0–99)
Total CHOL/HDL Ratio: 7.9 RATIO
Triglycerides: 242 mg/dL — ABNORMAL HIGH (ref <150)
VLDL: 48 mg/dL — ABNORMAL HIGH (ref 0–40)

## 2020-12-15 LAB — BRAIN NATRIURETIC PEPTIDE: B Natriuretic Peptide: 107.1 pg/mL — ABNORMAL HIGH (ref 0.0–100.0)

## 2020-12-15 MED ORDER — HYDRALAZINE HCL 25 MG PO TABS: 25.0000 mg | ORAL_TABLET | Freq: Three times a day (TID) | ORAL | 3 refills | Status: DC

## 2020-12-15 NOTE — Progress Notes (Signed)
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  Several hospitalizations at [facility] for acute on chronic respiratory failure, bilateral pleural effusions and acute diastolic HF (12/30/30-01/28/11 and 10/01/19-10/06/19)  + thoracentesis 10/04/19 of 1 L (patient had had 4 other thoracenteses in the prior year).   Admitted 10/21/19 to [facility] with prolonged hospitalization due to COVID-19 PNA, respiratory failure, A/C diastolic HF and CAD with PCI to LAD and RCA. Also had Cardiomems placed given recurrent diastolic CHF.  She lives at [facility].    She returns today for followup of CHF. She still does not have a prosthesis for her leg, so she continues to use wheelchair for mobility.  She can wheel her own chair.  She is short of breath wheeling her chair a long distance.  No chest pain.  She has slept sitting up for a long time, seems limited more by GERD symptoms than orthopnea.  She has pain with swallowing and is supposed to be getting an EGD.  She is taking hydralazine 100 mg only once daily.  BP is on the low side.  Weight is up 18 lbs.   ECG (personally reviewed): NSR, normal  PADP 8 by Cardiomems on 11/26/20  REDS clip 30%  Labs (3/21): LDL 51 Labs (4/21): K 4.1, creatinine 1.31, BNP 1253, hgb 9   Labs (9/21): BNP 50, K 3.6, creatinine 1.95  PMH: 1. HTN 2. Hyperlipidemia 3. TIA 4. Type 2 diabetes 5. Carotid stenosis:  - Carotid dopplers (8/21): 60-79% RICA stenosis, stable. 6. PAD: S/p right BKA.  - ABIs (8/21): 0.82 (mild disease) on left.  7. CKD: Stage 3.  8. COPD: On home oxygen 3 L and carries diagnosis of COPD but never smoked.  9.  Diabetic neuropathy 10. Renal artery stenosis:  - Renal artery dopplers (3/21): 1-59% bilateral renal artery stenosis.  11. CAD: LHC (3/21)  30% stenosis proximal LAD prior to D1. 50% stenosis LAD at D1. 80-90% stenosis mid LAD. 75% proximal D1 stenosis, moderate-large vessel.  Long 70% stenosis ostial to proximal LCx. Tiny OM1 with diffuse severe disease. Small OM2 with 99% proximal stenosis. 60% stenosis proximal PLOM. Long area of calcified plaque from the proximal into the mid RCA. 90% stenosis proximally, 80% stenosis in the mid RCA. 80% stenosis mid PDA. 70% stenosis mid PLV. - PCI (3/21): Atherectomy + DES to prox/mid RCA, DES mid LAD.  12. Chronic diastolic CHF: Has Cardiomems device.  - Echo (2/21): EF 55-60% with mild concentric LVH, both mean arterial pressure and LVEDP are elevated.  Mild MR and Mild TR. RV systolic pressure is 48 mmHg  - RHC (3/21): mean RA 2, PA 27/10, PCWP mean 13, CI 3.63 13. H/o transudative pleural effusions.  14. COVID-19 PNA: 2/21.   ROS: All systems negative except as listed in HPI, PMH and Problem List.  SH:  Social History   Socioeconomic History  . Marital status: Unknown    Spouse name: Not on file  . Number of children: Not on file  . Years of education: Not on file  . Highest education level: Not on file  Occupational History  . Not on file  Tobacco Use  . Smoking status:  Never Smoker  . Smokeless tobacco: Never Used  Substance and Sexual Activity  . Alcohol use: Never  . Drug use: Never  . Sexual activity: Not on file  Other Topics Concern  . Not on file  Social History Narrative  . Not on file   Social Determinants of Health   Financial Resource Strain: Not on file  Food Insecurity: Not on file  Transportation Needs: Not on file  Physical Activity: Not on file  Stress: Not on file  Social Connections: Not on file  Intimate Partner Violence: Not on file    FH:  Family History  Problem Relation Age of Onset  . Hypertension  Brother   . Diabetes Brother   . Hypertension Maternal Grandmother   . Heart attack Paternal Grandmother      Current Outpatient Medications  Medication Sig Dispense Refill  . acetaminophen (TYLENOL) 325 MG tablet Take 650 mg by mouth every 6 (six) hours as needed for headache (pain).    Marland Kitchen albuterol (VENTOLIN HFA) 108 (90 Base) MCG/ACT inhaler Inhale 2 puffs into the lungs every 4 (four) hours as needed for wheezing or shortness of breath.    Marland Kitchen aspirin EC 81 MG EC tablet Take 1 tablet (81 mg total) by mouth daily. 30 tablet 0  . atorvastatin (LIPITOR) 80 MG tablet Take 80 mg by mouth at bedtime.    . busPIRone (BUSPAR) 5 MG tablet Take 5 mg by mouth every morning.    . Cholecalciferol 50 MCG (2000 UT) CAPS Take 1 capsule by mouth once a week.    . clonazePAM (KLONOPIN) 0.25 MG disintegrating tablet Take 1 tablet (0.25 mg total) by mouth 2 (two) times daily. 60 tablet 0  . cloNIDine (CATAPRES) 0.3 MG tablet Take 1 tablet (0.3 mg total) by mouth 2 (two) times daily. 60 tablet 0  . clopidogrel (PLAVIX) 75 MG tablet Take 75 mg by mouth daily.    Marland Kitchen dicyclomine (BENTYL) 10 MG capsule Take 10 mg by mouth daily as needed for spasms.    . fenofibrate (TRICOR) 145 MG tablet Take 145 mg by mouth daily.    Marland Kitchen gabapentin (NEURONTIN) 400 MG capsule Take 400 mg by mouth 3 (three) times daily.    . hydrALAZINE (APRESOLINE) 25 MG tablet Take 1 tablet (25 mg total) by mouth 3 (three) times daily. 270 tablet 3  . insulin aspart (NOVOLOG) 100 UNIT/ML injection Inject 0-9 Units into the skin 3 (three) times daily with meals. 10 mL 11  . insulin glargine (LANTUS) 100 UNIT/ML injection Inject 0.2 mLs (20 Units total) into the skin daily. 10 mL 11  . isosorbide mononitrate (IMDUR) 60 MG 24 hr tablet Take 1.5 tablets (90 mg total) by mouth daily. 45 tablet 0  . labetalol (NORMODYNE) 300 MG tablet Take 300 mg by mouth every 12 (twelve) hours.    . meclizine (ANTIVERT) 50 MG tablet Take 1 tablet (50 mg total) by mouth  3 (three) times daily as needed. 30 tablet 0  . Multiple Vitamin (MULTIVITAMIN WITH MINERALS) TABS tablet Take 1 tablet by mouth daily. 30 tablet 0  . nitroGLYCERIN (NITROSTAT) 0.4 MG SL tablet Place 0.4 mg under the tongue every 5 (five) minutes as needed for chest pain.    . pantoprazole (PROTONIX) 40 MG tablet Take 40 mg by mouth daily.    . polyethylene glycol (MIRALAX / GLYCOLAX) 17 g packet Take 17 g by mouth daily as needed (constipation).    . polyvinyl alcohol (LIQUIFILM TEARS) 1.4 %  ophthalmic solution 1 drop as needed for dry eyes.    . potassium chloride (KLOR-CON) 10 MEQ tablet Take 10 mEq by mouth daily.    Marland Kitchen senna (SENOKOT) 8.6 MG TABS tablet Take 2 tablets by mouth at bedtime.    . torsemide (DEMADEX) 20 MG tablet Take 3 tablets (60 mg total) by mouth in the morning AND 2 tablets (40 mg total) every evening. 270 tablet 3  . venlafaxine (EFFEXOR) 37.5 MG tablet Take 37.5 mg by mouth daily.    Marland Kitchen amLODipine (NORVASC) 10 MG tablet Take 1 tablet (10 mg total) by mouth daily. 30 tablet 0   No current facility-administered medications for this encounter.    Vitals:   12/15/20 1014  BP: 104/60  Pulse: 74  SpO2: 100%  Weight: 77.4 kg (170 lb 9.6 oz)   Wt Readings from Last 3 Encounters:  12/15/20 77.4 kg (170 lb 9.6 oz)  05/21/20 68.5 kg (151 lb)  05/12/20 69.1 kg (152 lb 6.4 oz)    General: NAD Neck: No JVD, no thyromegaly or thyroid nodule.  Lungs: Clear to auscultation bilaterally with normal respiratory effort. CV: Nondisplaced PMI.  Heart regular S1/S2, no S3/S4, no murmur.  No peripheral edema.  No carotid bruit.  Normal pedal pulses.  Abdomen: Soft, nontender, no hepatosplenomegaly, no distention.  Skin: Intact without lesions or rashes.  Neurologic: Alert and oriented x 3.  Psych: Normal affect. Extremities: s/p right BKA.  HEENT: Normal.    ASSESSMENT & PLAN: 1. Chronic hypoxemic respiratory failure: 09/2019 COVID-19 viral pneumonia. She has been on home oxygen  3L [place] and said to have COPD but has never smoked.  I think we need further investigation into her lung disease.  - Still waiting for PFTs (will reorder).  2. Chronic diastolic CHF: Multiple admissions with hypertensive urgency/diastolic CHF. Echo in 2/21 with EF 55-60%, normal RV, IVC suggesting RA pressure 8 mmHg. [place] 3/21 showed optimized filling pressures with normal cardiac output.  With multiple diastolic CHF admissions, we placed a Cardiomems device. Probably NYHA class II (able to wheel her wheelchair without much difficulty), she is not volume overloaded by exam, most recent Cardiomems reading, or REDS clip.  Weight is up, but I think this is caloric rather than due to CHF.  - Continue  torsemide 60 qam/40 qpm.  BMET/BNP today.  - I asked her to use Cardiomems at least 3 days/week.  - Arrange for repeat echo.  3. Pleural effusions: 09/2019 Had thoracenteses at [facility] and again bilaterally at [facility] in 3/21. Transudates with negative cytology. Suspect due to CHF.  4. HTN: BP controlled on current regimen. Renal artery dopplers in 3/21 with nonobstructive disease.  - Continue amlodipine 10 mg daily.  - Would change regimen from hydralazine 100 mg daily to hydralazine 25 mg tid.  - Continue labetalol to 300 mg bid.  - Continue clonidine 0.3 bid.   5. CKD: Stage 3.  Suspect diabetic nephropathy.  - BMET today.  6. Carotid stenosis: 8/21 moderate RICA stenosis.  - Repeat carotids in 8/22.  7. Odynophagia: Also with GERD symptoms.  EGD is planned.  I think it would be reasonable to hold Plavix 5 days prior to procedure at this point, restart afterwards.  8. DM: Per primary MD.  9. CAD: In 3/21, she had atherectomy/PCI to prox/mid LAD and PCI mid LAD. For now, no intervention on moderate LCx disease. No chest pain.  - Continue Imdur 90 mg daily.  - Continue ASA 81 + Plavix 75.  See above regarding holding Plavix for EGD.  - Continue atorvastatin 80 mg daily. Check lipids.  - Eventually, would  transition from Plavix to rivaroxaban 2.5 mg bid given CAD and PAD.  10: PAD: S/p R BKA.  Mildly decreased ABI on left in 8/21.   - Continue statin, ASA, Plavix.  - Repeat peripheral arterial dopplers.   Followup in 3 months with APP.   Loralie Champagne 12/15/2020

## 2020-12-15 NOTE — Patient Instructions (Addendum)
EKG done today.  Labs done today. We will contact you only if your labs are abnormal.  DECREASE Hydralazine to 25mg  (1 tablet) by mouth 3 times daily.   No other medication changes were made. Please continue all current medications as prescribed.  Your provider recommends that you use your cardiomems 3 times a week.  Your physician recommends that you schedule a follow-up appointment soon for an echo 3 months with our [facility] here in our office.   Your physician has requested that you have a lower extremity arterial duplex. This test is an ultrasound of the arteries in the legs or arms. It looks at arterial blood flow in the legs and arms. Allow one hour for Lower and Upper Arterial scans. There are no restrictions or special instructionsThis has to be approved through your insurance prior to scheduling, once approved we will contact you to schedule an appointment.    If you have any questions or concerns before your next appointment please send [place] a message through [place] or call our office at [phone].    TO LEAVE A MESSAGE FOR THE NURSE SELECT OPTION 2, PLEASE LEAVE A MESSAGE INCLUDING: . YOUR NAME . DATE OF BIRTH . CALL BACK NUMBER . REASON FOR CALL**this is important as we prioritize the call backs  YOU WILL RECEIVE A CALL BACK THE SAME DAY AS LONG AS YOU CALL BEFORE 4:00 PM   Do the following things EVERYDAY: 1) Weigh yourself in the morning before breakfast. Write it down and keep it in a log. 2) Take your medicines as prescribed 3) Eat low salt foods--Limit salt (sodium) to 2000 mg per day.  4) Stay as active as you can everyday 5) Limit all fluids for the day to less than 2 liters   At the [facility], you and your health needs are our priority. As part of our continuing mission to provide you with exceptional heart care, we have created designated Provider Care Teams. These Care Teams include your primary Cardiologist (physician) and Advanced  Practice Providers (APPs- Physician Assistants and Nurse Practitioners) who all work together to provide you with the care you need, when you need it.   You may see any of the following providers on your designated Care Team at your next follow up: Marland Kitchen Dr Glori Bickers . Dr Loralie Champagne . Darrick Grinder, NP . Lyda Jester, PA . Audry Riles, PharmD   Please be sure to bring in all your medications bottles to every appointment.

## 2020-12-15 NOTE — Progress Notes (Signed)
ReDS Vest / Clip - 12/15/20 1100      ReDS Vest / Clip   Station Marker B    Ruler Value 28    ReDS Value Range Low volume    ReDS Actual Value 30

## 2020-12-17 ENCOUNTER — Encounter (HOSPITAL_COMMUNITY): Payer: Self-pay

## 2020-12-26 ENCOUNTER — Other Ambulatory Visit (HOSPITAL_COMMUNITY): Payer: Self-pay | Admitting: Cardiology

## 2020-12-26 DIAGNOSIS — I739 Peripheral vascular disease, unspecified: Secondary | ICD-10-CM

## 2021-01-02 ENCOUNTER — Ambulatory Visit (HOSPITAL_COMMUNITY)
Admission: RE | Admit: 2021-01-02 | Discharge: 2021-01-02 | Disposition: A | Discharge status: Home / Self Care | Payer: Medicaid Other | Source: Ambulatory Visit | Attending: Cardiology | Admitting: Cardiology

## 2021-01-02 ENCOUNTER — Other Ambulatory Visit (HOSPITAL_COMMUNITY): Payer: Self-pay | Admitting: Cardiology

## 2021-01-02 ENCOUNTER — Other Ambulatory Visit: Payer: Self-pay

## 2021-01-02 DIAGNOSIS — I739 Peripheral vascular disease, unspecified: Secondary | ICD-10-CM

## 2021-01-06 ENCOUNTER — Telehealth (HOSPITAL_COMMUNITY): Payer: Self-pay

## 2021-01-06 DIAGNOSIS — I739 Peripheral vascular disease, unspecified: Secondary | ICD-10-CM

## 2021-01-06 NOTE — Telephone Encounter (Signed)
-----   Message from Larey Dresser, MD sent at 01/02/2021  4:49 PM EDT ----- Significant PAD left left.  Would recommend PV consult with Dr. Gwenlyn Found or Dr. Fletcher Anon.

## 2021-01-06 NOTE — Telephone Encounter (Signed)
Pts husband made aware of results. Pt is in nursing home. Advised referral to be placed for vascular MD.  Verbalized understanding. Referral placed

## 2021-02-03 ENCOUNTER — Ambulatory Visit: Payer: Medicaid Other | Admitting: Cardiovascular Disease

## 2021-02-03 NOTE — Progress Notes (Deleted)
Cardiology Office Note   Date:  02/03/2021   ID:  Jamie [name], DOB [birth year], MRN [id number]  PCP:  Patient, No Pcp Per (Inactive)  Cardiologist:   Dr. Aundra Dubin.  No chief complaint on file.     History of Present Illness: Jamie [name] is a 60 y.o. female who was referred by Dr. Algernon Huxley for evaluation management of peripheral arterial disease.  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 She had a prolonged hospitalization in February 2021 due to COVID-19 pneumonia and respiratory failure.  Noninvasive vascular studies were done last month which showed an ABI of 0.85 on the left.  Duplex showed moderate left SFA stenosis, occluded TP trunk and one-vessel runoff via the anterior tibial artery.  Past Medical History:  Diagnosis Date   Amputation below knee ([place]) RT    CHF (congestive heart failure) (HCC)    Chronic diastolic HF (heart failure) ([place]) 10/21/2019   COPD (chronic obstructive pulmonary disease) (HCC)    Diabetes type 2, uncontrolled (HCC)    Gastritis and duodenitis    HTN (hypertension) 10/21/2019   PAD (peripheral artery disease) ([place]) 10/21/2019   PAD (peripheral artery disease) (HCC)    PNA (pneumonia)    TIA (transient ischemic attack)     Past Surgical History:  Procedure Laterality Date   BELOW KNEE LEG AMPUTATION Right    CORONARY ATHERECTOMY N/A 11/02/2019   Procedure: CORONARY ATHERECTOMY;  Surgeon: Martinique, Peter M, MD;  Location: [facility];  Service: Cardiovascular;  Laterality: N/A;   CORONARY STENT INTERVENTION N/A 11/02/2019   Procedure: CORONARY STENT INTERVENTION;  Surgeon: Martinique, Peter M, MD;  Location: [facility];  Service: Cardiovascular;  Laterality: N/A;   PRESSURE SENSOR/CARDIOMEMS N/A 10/31/2019   Procedure: PRESSURE  SENSOR/CARDIOMEMS;  Surgeon: Larey Dresser, MD;  Location: [place] [facility];  Service: Cardiovascular;  Laterality: N/A;   RIGHT/LEFT HEART CATH AND CORONARY ANGIOGRAPHY N/A 10/31/2019   Procedure: RIGHT/LEFT HEART CATH AND CORONARY ANGIOGRAPHY;  Surgeon: Larey Dresser, MD;  Location: [place] [facility];  Service: Cardiovascular;  Laterality: N/A;     Current Outpatient Medications  Medication Sig Dispense Refill   acetaminophen (TYLENOL) 325 MG tablet Take 650 mg by mouth every 6 (six) hours as needed for headache (pain).     albuterol (VENTOLIN HFA) 108 (90 Base) MCG/ACT inhaler Inhale 2 puffs into the lungs every 4 (four) hours as needed for wheezing or shortness of breath.     amLODipine (NORVASC) 10 MG tablet Take 1 tablet (10 mg total) by mouth daily. 30 tablet 0   aspirin EC 81 MG EC tablet Take 1 tablet (81 mg total) by mouth daily. 30 tablet 0   atorvastatin (LIPITOR) 80 MG tablet Take 80 mg by mouth at bedtime.     busPIRone (BUSPAR) 5 MG tablet Take 5 mg by mouth every morning.     Cholecalciferol 50 MCG (2000 UT) CAPS Take 1 capsule by mouth once a week.     clonazePAM (KLONOPIN) 0.25 MG disintegrating tablet Take 1 tablet (0.25 mg total) by mouth 2 (two) times daily. 60 tablet 0   cloNIDine (CATAPRES) 0.3 MG tablet Take 1 tablet (0.3 mg total) by mouth 2 (two) times daily. 60 tablet 0   clopidogrel (PLAVIX) 75 MG tablet Take 75 mg by mouth daily.  dicyclomine (BENTYL) 10 MG capsule Take 10 mg by mouth daily as needed for spasms.     fenofibrate (TRICOR) 145 MG tablet Take 145 mg by mouth daily.     gabapentin (NEURONTIN) 400 MG capsule Take 400 mg by mouth 3 (three) times daily.     hydrALAZINE (APRESOLINE) 25 MG tablet Take 1 tablet (25 mg total) by mouth 3 (three) times daily. 270 tablet 3   insulin aspart (NOVOLOG) 100 UNIT/ML injection Inject 0-9 Units into the skin 3 (three) times daily with meals. 10 mL 11   insulin glargine (LANTUS) 100 UNIT/ML injection Inject  0.2 mLs (20 Units total) into the skin daily. 10 mL 11   isosorbide mononitrate (IMDUR) 60 MG 24 hr tablet Take 1.5 tablets (90 mg total) by mouth daily. 45 tablet 0   labetalol (NORMODYNE) 300 MG tablet Take 300 mg by mouth every 12 (twelve) hours.     meclizine (ANTIVERT) 50 MG tablet Take 1 tablet (50 mg total) by mouth 3 (three) times daily as needed. 30 tablet 0   Multiple Vitamin (MULTIVITAMIN WITH MINERALS) TABS tablet Take 1 tablet by mouth daily. 30 tablet 0   nitroGLYCERIN (NITROSTAT) 0.4 MG SL tablet Place 0.4 mg under the tongue every 5 (five) minutes as needed for chest pain.     pantoprazole (PROTONIX) 40 MG tablet Take 40 mg by mouth daily.     polyethylene glycol (MIRALAX / GLYCOLAX) 17 g packet Take 17 g by mouth daily as needed (constipation).     polyvinyl alcohol (LIQUIFILM TEARS) 1.4 % ophthalmic solution 1 drop as needed for dry eyes.     potassium chloride (KLOR-CON) 10 MEQ tablet Take 10 mEq by mouth daily.     senna (SENOKOT) 8.6 MG TABS tablet Take 2 tablets by mouth at bedtime.     torsemide (DEMADEX) 20 MG tablet Take 3 tablets (60 mg total) by mouth in the morning AND 2 tablets (40 mg total) every evening. 270 tablet 3   venlafaxine (EFFEXOR) 37.5 MG tablet Take 37.5 mg by mouth daily.     No current facility-administered medications for this visit.    Allergies:   Codeine, Meperidine and related, and Tetracyclines & related    Social History:  The patient  reports that she has never smoked. She has never used smokeless tobacco. She reports that she does not drink alcohol and does not use drugs.   Family History:  The patient's ***family history includes Diabetes in her brother; Heart attack in her paternal grandmother; Hypertension in her brother and maternal grandmother.    ROS:  Please see the history of present illness.   Otherwise, review of systems are positive for {NONE DEFAULTED:18576}.   All other systems are reviewed and negative.    PHYSICAL  EXAM: VS:  There were no vitals taken for this visit. , BMI There is no height or weight on file to calculate BMI. GEN: Well nourished, well developed, in no acute distress  HEENT: normal  Neck: no JVD, carotid bruits, or masses Cardiac: ***RRR; no murmurs, rubs, or gallops,no edema  Respiratory:  clear to auscultation bilaterally, normal work of breathing GI: soft, nontender, nondistended, + BS MS: no deformity or atrophy  Skin: warm and dry, no rash Neuro:  Strength and sensation are intact Psych: euthymic mood, full affect   EKG:  EKG {ACTION; IS/IS [id number]} ordered today. The ekg ordered today demonstrates ***   Recent Labs: 12/15/2020: B Natriuretic Peptide 107.1; BUN 84; Creatinine, Ser 1.97;  Potassium 3.6; Sodium 132    Lipid Panel    Component Value Date/Time   CHOL 189 12/15/2020 1033   TRIG 242 (H) 12/15/2020 1033   HDL 24 (L) 12/15/2020 1033   CHOLHDL 7.9 12/15/2020 1033   VLDL 48 (H) 12/15/2020 1033   LDLCALC 117 (H) 12/15/2020 1033      Wt Readings from Last 3 Encounters:  12/15/20 170 lb 9.6 oz (77.4 kg)  05/21/20 151 lb (68.5 kg)  05/12/20 152 lb 6.4 oz (69.1 kg)      Other studies Reviewed: Additional studies/ records that were reviewed today include: ***. Review of the above records demonstrates: ***  No flowsheet data found.    ASSESSMENT AND PLAN:  1.  Peripheral arterial disease:  2.  Chronic diastolic heart failure:  3.  Essential hypertension:  4.  Hyperlipidemia:  5.  Coronary artery disease:    Disposition:   FU with *** in {gen number 1-32:[id number]} {Days to years:10300}  Signed,  Kathlyn Sacramento, MD  02/03/2021 8:11 AM    [place]

## 2021-02-05 ENCOUNTER — Telehealth (HOSPITAL_COMMUNITY): Payer: Self-pay | Admitting: *Deleted

## 2021-02-05 NOTE — Telephone Encounter (Signed)
Holly with [facility] left VM stating she needed to speak with a provider about pts cardio mems reading. Call back # [phone]  Routed to [place]

## 2021-02-18 DIAGNOSIS — I5032 Chronic diastolic (congestive) heart failure: Secondary | ICD-10-CM

## 2021-03-10 ENCOUNTER — Ambulatory Visit: Payer: Medicaid Other | Admitting: Cardiovascular Disease

## 2021-03-16 NOTE — Progress Notes (Signed)
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  Several hospitalizations at [facility] for acute on chronic respiratory failure, bilateral pleural effusions and acute diastolic HF (5/39/76-02/23/40 and 10/01/19-10/06/19)  + thoracentesis 10/04/19 of 1 L (patient had had 4 other thoracenteses in the prior year).   Admitted 10/21/19 to [facility] with prolonged hospitalization due to COVID-19 PNA, respiratory failure, A/C diastolic HF and CAD with PCI to LAD and RCA. Also had Cardiomems placed given recurrent diastolic CHF.  She lives at [facility].    She returned 4/22 for f/u. PFTs reordered to investigate her lung disease. She exhibited stable NYHA II symptoms, and she was not volume overloaded. Hydralazine cut back to 25 mg tid due to soft BP. Repeat echo ordered.  Today she returns for HF follow up, here with aide from facility.. She has completed her echo. Overall feeling weak, she says she continues to feel weak since having pneumonia earlier this year. She is on oxygen 3L continuously. She has CP frequently, usually after heavy meals. She still does not have a prosthesis for her leg, so she continues to use wheelchair for mobility. Denies increasing dizziness, edema, or PND/Orthopnea. Appetite ok. No fever or chills. Taking all medications.   ECG (personally reviewed): NSR 69 bpm.  Cardiomems (11/26/20) PADP 8; deactivated 03/16/21. 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  REDS clip 27%  Labs (3/21): LDL 51 Labs (4/21): K 4.1, creatinine 1.31, BNP 1253, hgb 9    Labs (9/21): BNP 50, K 3.6, creatinine 1.95 Labs (4/22): K 3.6, creatinine  1.97  PMH: 1. HTN 2. Hyperlipidemia 3. TIA 4. Type 2 diabetes 5. Carotid stenosis:  - Carotid dopplers (8/21): 60-79% RICA stenosis, stable. 6. PAD: S/p right BKA.  - ABIs (8/21): 0.82 (mild disease) on left.  7. CKD: Stage 3.  8. COPD: On home oxygen 3 L and carries diagnosis of COPD but never smoked.  9. Diabetic neuropathy 10. Renal artery stenosis:  - Renal artery dopplers (3/21): 1-59% bilateral renal artery stenosis.  11. CAD: LHC (3/21)  30% stenosis proximal LAD prior to D1. 50% stenosis LAD at D1. 80-90% stenosis mid LAD. 75% proximal D1 stenosis, moderate-large vessel.  Long 70% stenosis ostial to proximal LCx. Tiny OM1 with diffuse severe disease. Small OM2 with 99% proximal stenosis. 60% stenosis proximal PLOM. Long area of calcified plaque from the proximal into the mid RCA. 90% stenosis proximally, 80% stenosis in the mid RCA. 80% stenosis mid PDA. 70% stenosis mid PLV. - PCI (3/21): Atherectomy + DES to prox/mid RCA, DES mid LAD.  12. Chronic diastolic CHF: Has Cardiomems device.  - Echo (2/21): EF 55-60% with mild concentric LVH, both mean arterial pressure and LVEDP are elevated.  Mild MR and Mild TR. RV systolic pressure is 48 mmHg  - RHC (3/21): mean RA 2, PA 27/10, PCWP mean 13, CI 3.63 13. H/o transudative pleural effusions.  14. COVID-19 PNA: 2/21.   ROS: All systems negative except as listed in HPI, PMH and Problem List.  SH:  Social History   Socioeconomic History   Marital status: Unknown    Spouse name: Not on file   Number of children: Not on  file   Years of education: Not on file   Highest education level: Not on file  Occupational History   Not on file  Tobacco Use   Smoking status: Never   Smokeless tobacco: Never  Substance and Sexual Activity   Alcohol use: Never   Drug use: Never   Sexual activity: Not on file  Other Topics Concern   Not on file  Social History Narrative   Not on file   Social Determinants of Health   Financial  Resource Strain: Not on file  Food Insecurity: Not on file  Transportation Needs: Not on file  Physical Activity: Not on file  Stress: Not on file  Social Connections: Not on file  Intimate Partner Violence: Not on file   FH:  Family History  Problem Relation Age of Onset   Hypertension Brother    Diabetes Brother    Hypertension Maternal Grandmother    Heart attack Paternal Grandmother    Current Outpatient Medications  Medication Sig Dispense Refill   acetaminophen (TYLENOL) 325 MG tablet Take 650 mg by mouth every 6 (six) hours as needed for headache (pain).     albuterol (VENTOLIN HFA) 108 (90 Base) MCG/ACT inhaler Inhale 2 puffs into the lungs every 4 (four) hours as needed for wheezing or shortness of breath.     amLODipine (NORVASC) 10 MG tablet Take 1 tablet (10 mg total) by mouth daily. 30 tablet 0   aspirin EC 81 MG EC tablet Take 1 tablet (81 mg total) by mouth daily. 30 tablet 0   atorvastatin (LIPITOR) 80 MG tablet Take 80 mg by mouth at bedtime.     busPIRone (BUSPAR) 5 MG tablet Take 5 mg by mouth every morning.     Cholecalciferol 50 MCG (2000 UT) CAPS Take 1 capsule by mouth once a week.     clonazePAM (KLONOPIN) 0.25 MG disintegrating tablet Take 1 tablet (0.25 mg total) by mouth 2 (two) times daily. 60 tablet 0   cloNIDine (CATAPRES) 0.3 MG tablet Take 1 tablet (0.3 mg total) by mouth 2 (two) times daily. 60 tablet 0   clopidogrel (PLAVIX) 75 MG tablet Take 75 mg by mouth daily.     dicyclomine (BENTYL) 10 MG capsule Take 10 mg by mouth daily as needed for spasms.     fenofibrate (TRICOR) 145 MG tablet Take 145 mg by mouth daily.     gabapentin (NEURONTIN) 400 MG capsule Take 400 mg by mouth 3 (three) times daily.     hydrALAZINE (APRESOLINE) 25 MG tablet Take 1 tablet (25 mg total) by mouth 3 (three) times daily. 270 tablet 3   insulin aspart (NOVOLOG) 100 UNIT/ML injection Inject 0-9 Units into the skin 3 (three) times daily with meals. 10 mL 11   insulin  glargine (LANTUS) 100 UNIT/ML injection Inject 0.2 mLs (20 Units total) into the skin daily. 10 mL 11   isosorbide mononitrate (IMDUR) 60 MG 24 hr tablet Take 1.5 tablets (90 mg total) by mouth daily. 45 tablet 0   labetalol (NORMODYNE) 300 MG tablet Take 300 mg by mouth every 12 (twelve) hours.     meclizine (ANTIVERT) 50 MG tablet Take 1 tablet (50 mg total) by mouth 3 (three) times daily as needed. 30 tablet 0   Multiple Vitamin (MULTIVITAMIN WITH MINERALS) TABS tablet Take 1 tablet by mouth daily. 30 tablet 0   nitroGLYCERIN (NITROSTAT) 0.4 MG SL tablet Place 0.4 mg under the tongue every 5 (five) minutes as needed for chest pain.  pantoprazole (PROTONIX) 40 MG tablet Take 40 mg by mouth daily.     polyethylene glycol (MIRALAX / GLYCOLAX) 17 g packet Take 17 g by mouth daily as needed (constipation).     polyvinyl alcohol (LIQUIFILM TEARS) 1.4 % ophthalmic solution 1 drop as needed for dry eyes.     potassium chloride (KLOR-CON) 10 MEQ tablet Take 10 mEq by mouth daily.     senna (SENOKOT) 8.6 MG TABS tablet Take 2 tablets by mouth at bedtime.     torsemide (DEMADEX) 20 MG tablet Take 3 tablets (60 mg total) by mouth in the morning AND 2 tablets (40 mg total) every evening. 270 tablet 3   venlafaxine (EFFEXOR) 37.5 MG tablet Take 37.5 mg by mouth daily.     No current facility-administered medications for this encounter.   BP (!) 108/58   Pulse 70   Ht 5\' 3"  (1.6 m)   SpO2 99%   BMI 30.22 kg/m   Wt Readings from Last 3 Encounters:  12/15/20 77.4 kg  05/21/20 68.5 kg  05/12/20 69.1 kg   ReDs Clip: 27% Physical Exam: General:  NAD. No resp difficulty, on 3L oxygen, arrived in [facility]. HEENT: Normal Neck: Supple. No JVD. Carotids 2+ bilat; + right bruits. No lymphadenopathy or thryomegaly appreciated. Cor: PMI nondisplaced. Regular rate & rhythm. No rubs, gallops or murmurs. Lungs: Diminished, coarse basilar bs Abdomen: Soft, nontender, nondistended. No hepatosplenomegaly. No bruits  or masses. Good bowel sounds. Extremities: No cyanosis, clubbing, rash, edema; right BKA. Neuro: Alert & oriented x 3, cranial nerves grossly intact. Moves all 4 extremities w/o difficulty. Affect pleasant.  ASSESSMENT & PLAN: 1. Chronic hypoxemic respiratory failure: 09/2019 COVID-19 viral pneumonia. She has been on home oxygen 3L [place] and said to have COPD but has never smoked.  I think we need further investigation into her lung disease.  - Still has not completed  PFTs. 2. Chronic diastolic CHF: Multiple admissions with hypertensive urgency/diastolic CHF. Echo in 2/21 with EF 55-60%, normal RV, IVC suggesting RA pressure 8 mmHg.  [place] 3/21 showed optimized filling pressures with normal cardiac output.  With multiple diastolic CHF admissions, we placed a Cardiomems device. Probably NYHA class II-early III (able to wheel her wheelchair some today in clinic), she is not volume overloaded by exam or REDS clip.  She is unable to stand for weight today. - Continue torsemide 60 mg q AM/40 mg q pm.  Appears we tried to get in touch with her to change this to 40 mg bid (4/22), will keep as is pending BMET today.   - Echo results today pending. 3. Pleural effusions: 09/2019 Had thoracenteses at [facility] and again bilaterally at [facility] in 3/21. Transudates with negative cytology. Suspect due to CHF.  - With her SOB and coarse breath sounds, despite low normal ReDs reading, will obtain CXR today to rule out plueral effusions. 4. HTN: BP controlled on current regimen. Renal artery dopplers in 3/21 with nonobstructive disease.  - Continue amlodipine 10 mg daily.  - Continue hydralazine 25 mg tid.  - Continue labetalol 300 mg bid.  - Continue clonidine 0.3 bid.   5. CKD: Stage 3.  Suspect diabetic nephropathy.  - BMET today.  6. Carotid stenosis: 8/21 moderate RICA stenosis.  - Repeat carotids in 8/22, will order today.  7. Odynophagia: Also with GERD symptoms.  EGD (6/22) with no obvious esophageal mechanical  obstruction, likely benign fundic gland polyps, cannot exclude esophageal dysmotility and gastroparesis. - Tissue biopsy pending. Patient says  they were unable to complete colonoscopy due to incomplete bowel prep. 8. DM: Per primary MD.  9. CAD: In 3/21, she had atherectomy/PCI to prox/mid LAD and PCI mid LAD. For now, no intervention on moderate LCx disease. She has chest discomfort after she eats.  - Continue Imdur 90 mg daily.  - Continue ASA 81 + Plavix 75.   - Continue atorvastatin 80 mg daily. Lipids checked (4/22). - Eventually, would transition from Plavix to rivaroxaban 2.5 mg bid given CAD and PAD.  10: PAD: S/p R BKA.  Mildly decreased ABI on left in 8/21.   - Continue statin, ASA, Plavix.  - Repeat peripheral arterial dopplers (5/22) showed mild left lower arterial disease.   Followup in 3 months with Dr. Loistine Simas, [place] 03/17/2021

## 2021-03-17 ENCOUNTER — Ambulatory Visit (HOSPITAL_COMMUNITY)
Admission: RE | Admit: 2021-03-17 | Discharge: 2021-03-17 | Disposition: A | Discharge status: Home / Self Care | Payer: Medicaid Other | Source: Ambulatory Visit | Attending: Family Medicine | Admitting: Family Medicine

## 2021-03-17 ENCOUNTER — Encounter (HOSPITAL_COMMUNITY): Payer: Self-pay

## 2021-03-17 ENCOUNTER — Ambulatory Visit (HOSPITAL_COMMUNITY)
Admission: RE | Admit: 2021-03-17 | Discharge: 2021-03-17 | Disposition: A | Discharge status: Home / Self Care | Payer: Medicaid Other | Source: Ambulatory Visit | Attending: Cardiology | Admitting: Cardiology

## 2021-03-17 ENCOUNTER — Ambulatory Visit (HOSPITAL_COMMUNITY)
Admission: RE | Admit: 2021-03-17 | Discharge: 2021-03-17 | Disposition: A | Discharge status: Home / Self Care | Payer: Medicaid Other | Source: Ambulatory Visit

## 2021-03-17 ENCOUNTER — Other Ambulatory Visit: Payer: Self-pay

## 2021-03-17 VITALS — BP 108/58 | HR 70 | Ht 63.0 in

## 2021-03-17 DIAGNOSIS — J449 Chronic obstructive pulmonary disease, unspecified: Secondary | ICD-10-CM | POA: Diagnosis not present

## 2021-03-17 DIAGNOSIS — N183 Chronic kidney disease, stage 3 unspecified: Secondary | ICD-10-CM | POA: Diagnosis not present

## 2021-03-17 DIAGNOSIS — E7800 Pure hypercholesterolemia, unspecified: Secondary | ICD-10-CM | POA: Insufficient documentation

## 2021-03-17 DIAGNOSIS — R1310 Dysphagia, unspecified: Secondary | ICD-10-CM | POA: Diagnosis not present

## 2021-03-17 DIAGNOSIS — Z794 Long term (current) use of insulin: Secondary | ICD-10-CM | POA: Insufficient documentation

## 2021-03-17 DIAGNOSIS — Z79899 Other long term (current) drug therapy: Secondary | ICD-10-CM | POA: Diagnosis not present

## 2021-03-17 DIAGNOSIS — I739 Peripheral vascular disease, unspecified: Secondary | ICD-10-CM

## 2021-03-17 DIAGNOSIS — Z955 Presence of coronary angioplasty implant and graft: Secondary | ICD-10-CM | POA: Insufficient documentation

## 2021-03-17 DIAGNOSIS — E1151 Type 2 diabetes mellitus with diabetic peripheral angiopathy without gangrene: Secondary | ICD-10-CM | POA: Diagnosis not present

## 2021-03-17 DIAGNOSIS — Z7982 Long term (current) use of aspirin: Secondary | ICD-10-CM | POA: Insufficient documentation

## 2021-03-17 DIAGNOSIS — E1122 Type 2 diabetes mellitus with diabetic chronic kidney disease: Secondary | ICD-10-CM | POA: Insufficient documentation

## 2021-03-17 DIAGNOSIS — Z7902 Long term (current) use of antithrombotics/antiplatelets: Secondary | ICD-10-CM | POA: Diagnosis not present

## 2021-03-17 DIAGNOSIS — J90 Pleural effusion, not elsewhere classified: Secondary | ICD-10-CM | POA: Diagnosis not present

## 2021-03-17 DIAGNOSIS — I2510 Atherosclerotic heart disease of native coronary artery without angina pectoris: Secondary | ICD-10-CM | POA: Diagnosis not present

## 2021-03-17 DIAGNOSIS — Z89511 Acquired absence of right leg below knee: Secondary | ICD-10-CM | POA: Diagnosis not present

## 2021-03-17 DIAGNOSIS — I6521 Occlusion and stenosis of right carotid artery: Secondary | ICD-10-CM | POA: Diagnosis not present

## 2021-03-17 DIAGNOSIS — I5032 Chronic diastolic (congestive) heart failure: Secondary | ICD-10-CM

## 2021-03-17 DIAGNOSIS — F419 Anxiety disorder, unspecified: Secondary | ICD-10-CM | POA: Diagnosis not present

## 2021-03-17 DIAGNOSIS — J9611 Chronic respiratory failure with hypoxia: Secondary | ICD-10-CM | POA: Diagnosis not present

## 2021-03-17 DIAGNOSIS — K219 Gastro-esophageal reflux disease without esophagitis: Secondary | ICD-10-CM | POA: Diagnosis not present

## 2021-03-17 DIAGNOSIS — Z8616 Personal history of COVID-19: Secondary | ICD-10-CM | POA: Insufficient documentation

## 2021-03-17 DIAGNOSIS — F32A Depression, unspecified: Secondary | ICD-10-CM | POA: Diagnosis not present

## 2021-03-17 DIAGNOSIS — I25119 Atherosclerotic heart disease of native coronary artery with unspecified angina pectoris: Secondary | ICD-10-CM

## 2021-03-17 DIAGNOSIS — E1140 Type 2 diabetes mellitus with diabetic neuropathy, unspecified: Secondary | ICD-10-CM | POA: Diagnosis not present

## 2021-03-17 DIAGNOSIS — R0602 Shortness of breath: Secondary | ICD-10-CM

## 2021-03-17 DIAGNOSIS — I130 Hypertensive heart and chronic kidney disease with heart failure and stage 1 through stage 4 chronic kidney disease, or unspecified chronic kidney disease: Secondary | ICD-10-CM | POA: Insufficient documentation

## 2021-03-17 DIAGNOSIS — Z8673 Personal history of transient ischemic attack (TIA), and cerebral infarction without residual deficits: Secondary | ICD-10-CM | POA: Insufficient documentation

## 2021-03-17 DIAGNOSIS — I10 Essential (primary) hypertension: Secondary | ICD-10-CM

## 2021-03-17 DIAGNOSIS — R0789 Other chest pain: Secondary | ICD-10-CM | POA: Diagnosis not present

## 2021-03-17 DIAGNOSIS — Z9981 Dependence on supplemental oxygen: Secondary | ICD-10-CM | POA: Insufficient documentation

## 2021-03-17 DIAGNOSIS — Z8249 Family history of ischemic heart disease and other diseases of the circulatory system: Secondary | ICD-10-CM | POA: Insufficient documentation

## 2021-03-17 LAB — BASIC METABOLIC PANEL
Anion gap: 9 (ref 5–15)
BUN: 59 mg/dL — ABNORMAL HIGH (ref 6–20)
CO2: 29 mmol/L (ref 22–32)
Calcium: 9.3 mg/dL (ref 8.9–10.3)
Chloride: 96 mmol/L — ABNORMAL LOW (ref 98–111)
Creatinine, Ser: 2.06 mg/dL — ABNORMAL HIGH (ref 0.44–1.00)
GFR, Estimated: 27 mL/min — ABNORMAL LOW (ref >60)
Glucose, Bld: 234 mg/dL — ABNORMAL HIGH (ref 70–99)
Potassium: 4.6 mmol/L (ref 3.5–5.1)
Sodium: 134 mmol/L — ABNORMAL LOW (ref 135–145)

## 2021-03-17 LAB — ECHOCARDIOGRAM COMPLETE
Area-P 1/2: 2.5 cm2
P 1/2 time: 366 msec
S' Lateral: 2.7 cm

## 2021-03-17 LAB — BRAIN NATRIURETIC PEPTIDE: B Natriuretic Peptide: 261.8 pg/mL — ABNORMAL HIGH (ref 0.0–100.0)

## 2021-03-17 NOTE — Progress Notes (Signed)
ReDS Vest / Clip - 03/17/21 1100       ReDS Vest / Clip   Station Marker A    Ruler Value 30    ReDS Value Range Low volume    ReDS Actual Value 27

## 2021-03-17 NOTE — Progress Notes (Signed)
  Echocardiogram 2D Echocardiogram has been performed.  Jacquie Lukes G Ulices Maack 03/17/2021, 11:02 AM

## 2021-03-17 NOTE — Patient Instructions (Signed)
It was great to see you today! No medication changes are needed at this time.  Labs today We will only contact you if something comes back abnormal or we need to make some changes. Otherwise no news is good news!  A chest x-ray takes a picture of the organs and structures inside the chest, including the heart, lungs, and blood vessels. This test can show several things, including, whether the heart is enlarges; whether fluid is building up in the lungs; and whether pacemaker / defibrillator leads are still in place.  Your physician has requested that you have a carotid duplex. This test is an ultrasound of the carotid arteries in your neck. It looks at blood flow through these arteries that supply the brain with blood. Allow one hour for this exam. There are no restrictions or special instructions.  Your physician recommends that you schedule a follow-up appointment in: 3 months with Dr Dan Europe the [facility], you and your health needs are our priority. As part of our continuing mission to provide you with exceptional heart care, we have created designated Provider Care Teams. These Care Teams include your primary Cardiologist (physician) and Advanced Practice Providers (APPs- Physician Assistants and Nurse Practitioners) who all work together to provide you with the care you need, when you need it.   You may see any of the following providers on your designated Care Team at your next follow up: Dr Glori Bickers Dr Loralie Champagne Dr Patrice Paradise, NP Lyda Jester, [place] Ginnie Smart Audry Riles, PharmD   Please be sure to bring in all your medications bottles to every appointment.

## 2021-03-20 ENCOUNTER — Telehealth (HOSPITAL_COMMUNITY): Payer: Self-pay | Admitting: Family Medicine

## 2021-03-20 NOTE — Telephone Encounter (Signed)
Left message for Jamie [name], DON for [facility], notifying her of Jamie [name]'s PNA and pleural effusions on CXR with Dr. Claris Gladden recommendation of facility practitioner starting abx treatment and to hold off on tapping pleural effusions unless she becomes markedly SOB.   Jamie Katz, FNP-BC

## 2021-04-02 ENCOUNTER — Ambulatory Visit (HOSPITAL_COMMUNITY)
Admission: RE | Admit: 2021-04-02 | Discharge: 2021-04-02 | Disposition: A | Discharge status: Home / Self Care | Payer: Medicaid Other | Source: Ambulatory Visit | Attending: Cardiology | Admitting: Cardiology

## 2021-04-02 ENCOUNTER — Other Ambulatory Visit (HOSPITAL_COMMUNITY): Payer: Self-pay | Admitting: Family Medicine

## 2021-04-02 ENCOUNTER — Other Ambulatory Visit: Payer: Self-pay

## 2021-04-02 DIAGNOSIS — I6522 Occlusion and stenosis of left carotid artery: Secondary | ICD-10-CM | POA: Diagnosis not present

## 2021-04-02 DIAGNOSIS — I6523 Occlusion and stenosis of bilateral carotid arteries: Secondary | ICD-10-CM

## 2021-04-02 DIAGNOSIS — I25119 Atherosclerotic heart disease of native coronary artery with unspecified angina pectoris: Secondary | ICD-10-CM | POA: Diagnosis not present

## 2021-04-14 ENCOUNTER — Ambulatory Visit: Payer: Medicaid Other | Admitting: Cardiovascular Disease

## 2021-04-14 NOTE — Progress Notes (Deleted)
Cardiology Office Note   Date:  04/14/2021   ID:  July, Linam September 01, 1960, MRN [id number]  PCP:  Patient, No Pcp Per (Inactive)  Cardiologist:  Dr. Aundra Dubin  No chief complaint on file.     History of Present Illness: Jamie [name] is a 60 y.o. female who was referred by Dr. Aundra Dubin for evaluation management of peripheral arterial disease.  He has known history of chronic diastolic heart failure, essential hypertension, recurrent pleural effusion, hyperlipidemia, COPD on home oxygen though she never smoked, GERD, right BKA due to diabetic gangrene, depression and anxiety.    Past Medical History:  Diagnosis Date   Amputation below knee ([place]) RT    CHF (congestive heart failure) (HCC)    Chronic diastolic HF (heart failure) ([place]) 10/21/2019   COPD (chronic obstructive pulmonary disease) (HCC)    Diabetes type 2, uncontrolled (HCC)    Gastritis and duodenitis    HTN (hypertension) 10/21/2019   PAD (peripheral artery disease) ([place]) 10/21/2019   PAD (peripheral artery disease) (HCC)    PNA (pneumonia)    TIA (transient ischemic attack)     Past Surgical History:  Procedure Laterality Date   BELOW KNEE LEG AMPUTATION Right    CORONARY ATHERECTOMY N/A 11/02/2019   Procedure: CORONARY ATHERECTOMY;  Surgeon: Martinique, Peter M, MD;  Location: [place] [facility];  Service: Cardiovascular;  Laterality: N/A;   CORONARY STENT INTERVENTION N/A 11/02/2019   Procedure: CORONARY STENT INTERVENTION;  Surgeon: Martinique, Peter M, MD;  Location: [place] [facility];  Service: Cardiovascular;  Laterality: N/A;   PRESSURE SENSOR/CARDIOMEMS N/A 10/31/2019   Procedure: PRESSURE SENSOR/CARDIOMEMS;  Surgeon: Larey Dresser, MD;  Location: [place] [facility];  Service: Cardiovascular;  Laterality: N/A;   RIGHT/LEFT HEART CATH AND CORONARY ANGIOGRAPHY N/A 10/31/2019   Procedure: RIGHT/LEFT HEART CATH AND CORONARY ANGIOGRAPHY;  Surgeon: Larey Dresser, MD;  Location: [place] [facility];  Service:  Cardiovascular;  Laterality: N/A;     Current Outpatient Medications  Medication Sig Dispense Refill   acetaminophen (TYLENOL) 325 MG tablet Take 650 mg by mouth every 6 (six) hours as needed for headache (pain).     albuterol (VENTOLIN HFA) 108 (90 Base) MCG/ACT inhaler Inhale 2 puffs into the lungs every 4 (four) hours as needed for wheezing or shortness of breath.     amLODipine (NORVASC) 10 MG tablet Take 1 tablet (10 mg total) by mouth daily. 30 tablet 0   aspirin EC 81 MG EC tablet Take 1 tablet (81 mg total) by mouth daily. 30 tablet 0   atorvastatin (LIPITOR) 80 MG tablet Take 80 mg by mouth at bedtime.     busPIRone (BUSPAR) 5 MG tablet Take 5 mg by mouth every morning.     Cholecalciferol 50 MCG (2000 UT) CAPS Take 1 capsule by mouth once a week.     clonazePAM (KLONOPIN) 0.25 MG disintegrating tablet Take 1 tablet (0.25 mg total) by mouth 2 (two) times daily. 60 tablet 0   cloNIDine (CATAPRES) 0.3 MG tablet Take 1 tablet (0.3 mg total) by mouth 2 (two) times daily. 60 tablet 0   clopidogrel (PLAVIX) 75 MG tablet Take 75 mg by mouth daily.     dicyclomine (BENTYL) 10 MG capsule Take 10 mg by mouth daily as needed for spasms.     fenofibrate (TRICOR) 145 MG tablet Take 145 mg by mouth daily.     gabapentin (NEURONTIN) 400 MG capsule Take 400 mg by mouth 3 (three) times daily.  hydrALAZINE (APRESOLINE) 25 MG tablet Take 1 tablet (25 mg total) by mouth 3 (three) times daily. 270 tablet 3   insulin aspart (NOVOLOG) 100 UNIT/ML injection Inject 0-9 Units into the skin 3 (three) times daily with meals. 10 mL 11   insulin glargine (LANTUS) 100 UNIT/ML injection Inject 0.2 mLs (20 Units total) into the skin daily. 10 mL 11   isosorbide mononitrate (IMDUR) 60 MG 24 hr tablet Take 1.5 tablets (90 mg total) by mouth daily. 45 tablet 0   labetalol (NORMODYNE) 300 MG tablet Take 300 mg by mouth every 12 (twelve) hours.     meclizine (ANTIVERT) 50 MG tablet Take 1 tablet (50 mg total) by mouth  3 (three) times daily as needed. 30 tablet 0   Multiple Vitamin (MULTIVITAMIN WITH MINERALS) TABS tablet Take 1 tablet by mouth daily. 30 tablet 0   nitroGLYCERIN (NITROSTAT) 0.4 MG SL tablet Place 0.4 mg under the tongue every 5 (five) minutes as needed for chest pain.     pantoprazole (PROTONIX) 40 MG tablet Take 40 mg by mouth daily.     polyethylene glycol (MIRALAX / GLYCOLAX) 17 g packet Take 17 g by mouth daily as needed (constipation).     polyvinyl alcohol (LIQUIFILM TEARS) 1.4 % ophthalmic solution 1 drop as needed for dry eyes.     potassium chloride (KLOR-CON) 10 MEQ tablet Take 10 mEq by mouth daily.     senna (SENOKOT) 8.6 MG TABS tablet Take 2 tablets by mouth at bedtime.     torsemide (DEMADEX) 20 MG tablet Take 3 tablets (60 mg total) by mouth in the morning AND 2 tablets (40 mg total) every evening. 270 tablet 3   venlafaxine (EFFEXOR) 37.5 MG tablet Take 37.5 mg by mouth daily.     No current facility-administered medications for this visit.    Allergies:   Codeine, Meperidine and related, and Tetracyclines & related    Social History:  The patient  reports that she has never smoked. She has never used smokeless tobacco. She reports that she does not drink alcohol and does not use drugs.   Family History:  The patient's ***family history includes Diabetes in her brother; Heart attack in her paternal grandmother; Hypertension in her brother and maternal grandmother.    ROS:  Please see the history of present illness.   Otherwise, review of systems are positive for {NONE DEFAULTED:18576}.   All other systems are reviewed and negative.    PHYSICAL EXAM: VS:  There were no vitals taken for this visit. , BMI There is no height or weight on file to calculate BMI. GEN: Well nourished, well developed, in no acute distress  HEENT: normal  Neck: no JVD, carotid bruits, or masses Cardiac: ***RRR; no murmurs, rubs, or gallops,no edema  Respiratory:  clear to auscultation  bilaterally, normal work of breathing GI: soft, nontender, nondistended, + BS MS: no deformity or atrophy  Skin: warm and dry, no rash Neuro:  Strength and sensation are intact Psych: euthymic mood, full affect   EKG:  EKG {ACTION; IS/IS EYC:[id number]} ordered today. The ekg ordered today demonstrates ***   Recent Labs: 03/17/2021: B Natriuretic Peptide 261.8; BUN 59; Creatinine, Ser 2.06; Potassium 4.6; Sodium 134    Lipid Panel    Component Value Date/Time   CHOL 189 12/15/2020 1033   TRIG 242 (H) 12/15/2020 1033   HDL 24 (L) 12/15/2020 1033   CHOLHDL 7.9 12/15/2020 1033   VLDL 48 (H) 12/15/2020 1033   LDLCALC 117 (  H) 12/15/2020 1033      Wt Readings from Last 3 Encounters:  12/15/20 170 lb 9.6 oz (77.4 kg)  05/21/20 151 lb (68.5 kg)  05/12/20 152 lb 6.4 oz (69.1 kg)      Other studies Reviewed: Additional studies/ records that were reviewed today include: ***. Review of the above records demonstrates: ***  No flowsheet data found.    ASSESSMENT AND PLAN:  1.  ***    Disposition:   FU with *** in {gen number [id number]} {Days to years:10300}  Signed,  Kathlyn Sacramento, MD  04/14/2021 8:13 AM    [place]

## 2021-06-17 ENCOUNTER — Observation Stay (HOSPITAL_COMMUNITY): Payer: Medicare Other

## 2021-06-17 ENCOUNTER — Inpatient Hospital Stay (HOSPITAL_COMMUNITY)
Admission: EM | Admit: 2021-06-17 | Discharge: 2021-06-25 | DRG: 291 | Disposition: A | Discharge status: Skilled Nursing Facility | Payer: Medicare Other | Attending: Family Medicine | Admitting: Family Medicine

## 2021-06-17 ENCOUNTER — Emergency Department (HOSPITAL_COMMUNITY): Payer: Medicare Other

## 2021-06-17 ENCOUNTER — Other Ambulatory Visit: Payer: Self-pay

## 2021-06-17 DIAGNOSIS — Z66 Do not resuscitate: Secondary | ICD-10-CM | POA: Diagnosis present

## 2021-06-17 DIAGNOSIS — Z8673 Personal history of transient ischemic attack (TIA), and cerebral infarction without residual deficits: Secondary | ICD-10-CM

## 2021-06-17 DIAGNOSIS — K219 Gastro-esophageal reflux disease without esophagitis: Secondary | ICD-10-CM | POA: Diagnosis present

## 2021-06-17 DIAGNOSIS — Z885 Allergy status to narcotic agent status: Secondary | ICD-10-CM

## 2021-06-17 DIAGNOSIS — I255 Ischemic cardiomyopathy: Secondary | ICD-10-CM | POA: Diagnosis present

## 2021-06-17 DIAGNOSIS — J9621 Acute and chronic respiratory failure with hypoxia: Secondary | ICD-10-CM | POA: Diagnosis present

## 2021-06-17 DIAGNOSIS — I110 Hypertensive heart disease with heart failure: Secondary | ICD-10-CM

## 2021-06-17 DIAGNOSIS — I160 Hypertensive urgency: Secondary | ICD-10-CM | POA: Diagnosis present

## 2021-06-17 DIAGNOSIS — E1151 Type 2 diabetes mellitus with diabetic peripheral angiopathy without gangrene: Secondary | ICD-10-CM | POA: Diagnosis present

## 2021-06-17 DIAGNOSIS — D638 Anemia in other chronic diseases classified elsewhere: Secondary | ICD-10-CM | POA: Diagnosis present

## 2021-06-17 DIAGNOSIS — Z794 Long term (current) use of insulin: Secondary | ICD-10-CM

## 2021-06-17 DIAGNOSIS — Z7902 Long term (current) use of antithrombotics/antiplatelets: Secondary | ICD-10-CM

## 2021-06-17 DIAGNOSIS — Z888 Allergy status to other drugs, medicaments and biological substances status: Secondary | ICD-10-CM

## 2021-06-17 DIAGNOSIS — F321 Major depressive disorder, single episode, moderate: Secondary | ICD-10-CM | POA: Diagnosis present

## 2021-06-17 DIAGNOSIS — K5900 Constipation, unspecified: Secondary | ICD-10-CM | POA: Diagnosis present

## 2021-06-17 DIAGNOSIS — D649 Anemia, unspecified: Secondary | ICD-10-CM

## 2021-06-17 DIAGNOSIS — Z20822 Contact with and (suspected) exposure to covid-19: Secondary | ICD-10-CM | POA: Diagnosis present

## 2021-06-17 DIAGNOSIS — I5033 Acute on chronic diastolic (congestive) heart failure: Secondary | ICD-10-CM | POA: Diagnosis present

## 2021-06-17 DIAGNOSIS — I509 Heart failure, unspecified: Secondary | ICD-10-CM

## 2021-06-17 DIAGNOSIS — Z89511 Acquired absence of right leg below knee: Secondary | ICD-10-CM

## 2021-06-17 DIAGNOSIS — E1122 Type 2 diabetes mellitus with diabetic chronic kidney disease: Secondary | ICD-10-CM | POA: Diagnosis present

## 2021-06-17 DIAGNOSIS — R0689 Other abnormalities of breathing: Secondary | ICD-10-CM | POA: Diagnosis present

## 2021-06-17 DIAGNOSIS — Z91199 Patient's noncompliance with other medical treatment and regimen due to unspecified reason: Secondary | ICD-10-CM

## 2021-06-17 DIAGNOSIS — N179 Acute kidney failure, unspecified: Secondary | ICD-10-CM | POA: Diagnosis present

## 2021-06-17 DIAGNOSIS — I130 Hypertensive heart and chronic kidney disease with heart failure and stage 1 through stage 4 chronic kidney disease, or unspecified chronic kidney disease: Secondary | ICD-10-CM | POA: Diagnosis not present

## 2021-06-17 DIAGNOSIS — Z79899 Other long term (current) drug therapy: Secondary | ICD-10-CM

## 2021-06-17 DIAGNOSIS — I2510 Atherosclerotic heart disease of native coronary artery without angina pectoris: Secondary | ICD-10-CM | POA: Diagnosis present

## 2021-06-17 DIAGNOSIS — E1165 Type 2 diabetes mellitus with hyperglycemia: Secondary | ICD-10-CM | POA: Diagnosis present

## 2021-06-17 DIAGNOSIS — I5031 Acute diastolic (congestive) heart failure: Secondary | ICD-10-CM

## 2021-06-17 DIAGNOSIS — J90 Pleural effusion, not elsewhere classified: Secondary | ICD-10-CM | POA: Diagnosis not present

## 2021-06-17 DIAGNOSIS — J9811 Atelectasis: Secondary | ICD-10-CM | POA: Diagnosis present

## 2021-06-17 DIAGNOSIS — Z833 Family history of diabetes mellitus: Secondary | ICD-10-CM

## 2021-06-17 DIAGNOSIS — R0602 Shortness of breath: Secondary | ICD-10-CM | POA: Diagnosis present

## 2021-06-17 DIAGNOSIS — E7800 Pure hypercholesterolemia, unspecified: Secondary | ICD-10-CM | POA: Diagnosis present

## 2021-06-17 DIAGNOSIS — Z7982 Long term (current) use of aspirin: Secondary | ICD-10-CM

## 2021-06-17 DIAGNOSIS — Z8616 Personal history of COVID-19: Secondary | ICD-10-CM

## 2021-06-17 DIAGNOSIS — Z9861 Coronary angioplasty status: Secondary | ICD-10-CM

## 2021-06-17 DIAGNOSIS — Z993 Dependence on wheelchair: Secondary | ICD-10-CM

## 2021-06-17 DIAGNOSIS — Z9981 Dependence on supplemental oxygen: Secondary | ICD-10-CM

## 2021-06-17 DIAGNOSIS — N1832 Chronic kidney disease, stage 3b: Secondary | ICD-10-CM | POA: Diagnosis present

## 2021-06-17 DIAGNOSIS — Z8249 Family history of ischemic heart disease and other diseases of the circulatory system: Secondary | ICD-10-CM

## 2021-06-17 DIAGNOSIS — J449 Chronic obstructive pulmonary disease, unspecified: Secondary | ICD-10-CM | POA: Diagnosis present

## 2021-06-17 LAB — CBC WITH DIFFERENTIAL/PLATELET
Abs Immature Granulocytes: 0.15 10*3/uL — ABNORMAL HIGH (ref 0.00–0.07)
Basophils Absolute: 0.1 10*3/uL (ref 0.0–0.1)
Basophils Relative: 1 %
Eosinophils Absolute: 0.3 10*3/uL (ref 0.0–0.5)
Eosinophils Relative: 3 %
HCT: 24 % — ABNORMAL LOW (ref 36.0–46.0)
Hemoglobin: 7.4 g/dL — ABNORMAL LOW (ref 12.0–15.0)
Immature Granulocytes: 1 %
Lymphocytes Relative: 4 %
Lymphs Abs: 0.5 10*3/uL — ABNORMAL LOW (ref 0.7–4.0)
MCH: 26.7 pg (ref 26.0–34.0)
MCHC: 30.8 g/dL (ref 30.0–36.0)
MCV: 86.6 fL (ref 80.0–100.0)
Monocytes Absolute: 0.7 10*3/uL (ref 0.1–1.0)
Monocytes Relative: 5 %
Neutro Abs: 11.2 10*3/uL — ABNORMAL HIGH (ref 1.7–7.7)
Neutrophils Relative %: 86 %
Platelets: 279 10*3/uL (ref 150–400)
RBC: 2.77 MIL/uL — ABNORMAL LOW (ref 3.87–5.11)
RDW: 14 % (ref 11.5–15.5)
WBC: 12.9 10*3/uL — ABNORMAL HIGH (ref 4.0–10.5)
nRBC: 0 % (ref 0.0–0.2)

## 2021-06-17 LAB — PREPARE RBC (CROSSMATCH)

## 2021-06-17 LAB — BASIC METABOLIC PANEL
Anion gap: 12 (ref 5–15)
BUN: 48 mg/dL — ABNORMAL HIGH (ref 6–20)
CO2: 22 mmol/L (ref 22–32)
Calcium: 8.7 mg/dL — ABNORMAL LOW (ref 8.9–10.3)
Chloride: 102 mmol/L (ref 98–111)
Creatinine, Ser: 1.72 mg/dL — ABNORMAL HIGH (ref 0.44–1.00)
GFR, Estimated: 34 mL/min — ABNORMAL LOW (ref >60)
Glucose, Bld: 254 mg/dL — ABNORMAL HIGH (ref 70–99)
Potassium: 4.7 mmol/L (ref 3.5–5.1)
Sodium: 136 mmol/L (ref 135–145)

## 2021-06-17 LAB — TROPONIN I (HIGH SENSITIVITY): Troponin I (High Sensitivity): 17 ng/L (ref <18)

## 2021-06-17 LAB — BRAIN NATRIURETIC PEPTIDE: B Natriuretic Peptide: 571.9 pg/mL — ABNORMAL HIGH (ref 0.0–100.0)

## 2021-06-17 LAB — RESP PANEL BY RT-PCR (FLU A&B, COVID) ARPGX2
Influenza A by PCR: NEGATIVE
Influenza B by PCR: NEGATIVE
SARS Coronavirus 2 by RT PCR: NEGATIVE

## 2021-06-17 LAB — MRSA NEXT GEN BY PCR, NASAL: MRSA by PCR Next Gen: DETECTED — AB

## 2021-06-17 LAB — FERRITIN: Ferritin: 244 ng/mL (ref 11–307)

## 2021-06-17 LAB — CBG MONITORING, ED: Glucose-Capillary: 208 mg/dL — ABNORMAL HIGH (ref 70–99)

## 2021-06-17 LAB — HIV ANTIBODY (ROUTINE TESTING W REFLEX): HIV Screen 4th Generation wRfx: NONREACTIVE

## 2021-06-17 LAB — LACTIC ACID, PLASMA: Lactic Acid, Venous: 0.9 mmol/L (ref 0.5–1.9)

## 2021-06-17 LAB — HEMOGLOBIN A1C
Hgb A1c MFr Bld: 6.6 % — ABNORMAL HIGH (ref 4.8–5.6)
Mean Plasma Glucose: 142.72 mg/dL

## 2021-06-17 MED ORDER — CLONAZEPAM 0.5 MG PO TABS
0.5000 mg | ORAL_TABLET | Freq: Two times a day (BID) | ORAL | Status: DC
  Administered 2021-06-17 – 2021-06-18 (×2): 0.5 mg via ORAL
  Filled 2021-06-17 (×2): qty 1

## 2021-06-17 MED ORDER — SODIUM CHLORIDE 0.9% IV SOLUTION: Freq: Once | INTRAVENOUS | Status: DC

## 2021-06-17 MED ORDER — HYDRALAZINE HCL 25 MG PO TABS
25.0000 mg | ORAL_TABLET | Freq: Three times a day (TID) | ORAL | Status: DC
  Administered 2021-06-17 – 2021-06-18 (×4): 25 mg via ORAL
  Filled 2021-06-17 (×4): qty 1

## 2021-06-17 MED ORDER — LABETALOL HCL 200 MG PO TABS
300.0000 mg | ORAL_TABLET | Freq: Two times a day (BID) | ORAL | Status: DC
  Administered 2021-06-17 – 2021-06-25 (×16): 300 mg via ORAL
  Filled 2021-06-17: qty 2
  Filled 2021-06-17 (×3): qty 1
  Filled 2021-06-17: qty 2
  Filled 2021-06-17 (×11): qty 1

## 2021-06-17 MED ORDER — ATORVASTATIN CALCIUM 80 MG PO TABS
80.0000 mg | ORAL_TABLET | Freq: Every day | ORAL | Status: DC
  Administered 2021-06-17 – 2021-06-24 (×8): 80 mg via ORAL
  Filled 2021-06-17: qty 1
  Filled 2021-06-17: qty 2
  Filled 2021-06-17 (×6): qty 1

## 2021-06-17 MED ORDER — PANTOPRAZOLE SODIUM 40 MG PO TBEC
40.0000 mg | DELAYED_RELEASE_TABLET | Freq: Every day | ORAL | Status: DC
  Administered 2021-06-18 – 2021-06-25 (×8): 40 mg via ORAL
  Filled 2021-06-17 (×8): qty 1

## 2021-06-17 MED ORDER — POLYETHYLENE GLYCOL 3350 17 G PO PACK
17.0000 g | PACK | Freq: Every day | ORAL | Status: DC | PRN
  Administered 2021-06-23: 17 g via ORAL
  Filled 2021-06-17: qty 1

## 2021-06-17 MED ORDER — CLONIDINE HCL 0.3 MG PO TABS
0.3000 mg | ORAL_TABLET | Freq: Two times a day (BID) | ORAL | Status: DC
  Administered 2021-06-17 – 2021-06-25 (×16): 0.3 mg via ORAL
  Filled 2021-06-17 (×16): qty 1

## 2021-06-17 MED ORDER — AMLODIPINE BESYLATE 10 MG PO TABS
10.0000 mg | ORAL_TABLET | Freq: Every day | ORAL | Status: DC
  Administered 2021-06-18 – 2021-06-25 (×8): 10 mg via ORAL
  Filled 2021-06-17 (×5): qty 1
  Filled 2021-06-17: qty 2
  Filled 2021-06-17 (×2): qty 1

## 2021-06-17 MED ORDER — GABAPENTIN 100 MG PO CAPS
100.0000 mg | ORAL_CAPSULE | Freq: Three times a day (TID) | ORAL | Status: DC
  Administered 2021-06-17 – 2021-06-25 (×23): 100 mg via ORAL
  Filled 2021-06-17 (×23): qty 1

## 2021-06-17 MED ORDER — INSULIN ASPART 100 UNIT/ML IJ SOLN
0.0000 [IU] | Freq: Three times a day (TID) | INTRAMUSCULAR | Status: DC
  Administered 2021-06-17: 5 [IU] via SUBCUTANEOUS
  Administered 2021-06-18: 2 [IU] via SUBCUTANEOUS
  Administered 2021-06-18 (×2): 3 [IU] via SUBCUTANEOUS
  Administered 2021-06-19 (×2): 5 [IU] via SUBCUTANEOUS
  Administered 2021-06-19: 3 [IU] via SUBCUTANEOUS
  Administered 2021-06-20: 2 [IU] via SUBCUTANEOUS
  Administered 2021-06-20 – 2021-06-21 (×3): 8 [IU] via SUBCUTANEOUS
  Administered 2021-06-21: 2 [IU] via SUBCUTANEOUS
  Administered 2021-06-21: 15 [IU] via SUBCUTANEOUS
  Administered 2021-06-22: 11 [IU] via SUBCUTANEOUS
  Administered 2021-06-22: 15 [IU] via SUBCUTANEOUS
  Administered 2021-06-22: 11 [IU] via SUBCUTANEOUS

## 2021-06-17 MED ORDER — SENNA 8.6 MG PO TABS
2.0000 | ORAL_TABLET | Freq: Every day | ORAL | Status: DC
  Administered 2021-06-17 – 2021-06-24 (×8): 17.2 mg via ORAL
  Filled 2021-06-17 (×8): qty 2

## 2021-06-17 MED ORDER — FENOFIBRATE 160 MG PO TABS: 160.0000 mg | ORAL_TABLET | Freq: Every day | ORAL | Status: DC

## 2021-06-17 MED ORDER — FUROSEMIDE 10 MG/ML IJ SOLN
80.0000 mg | Freq: Two times a day (BID) | INTRAMUSCULAR | Status: DC
  Administered 2021-06-17 – 2021-06-20 (×6): 80 mg via INTRAVENOUS
  Filled 2021-06-17 (×6): qty 8

## 2021-06-17 MED ORDER — CLOPIDOGREL BISULFATE 75 MG PO TABS
75.0000 mg | ORAL_TABLET | Freq: Every day | ORAL | Status: DC
  Administered 2021-06-18 – 2021-06-25 (×8): 75 mg via ORAL
  Filled 2021-06-17 (×8): qty 1

## 2021-06-17 MED ORDER — ISOSORBIDE MONONITRATE ER 60 MG PO TB24
120.0000 mg | ORAL_TABLET | Freq: Every day | ORAL | Status: DC
  Administered 2021-06-18 – 2021-06-25 (×8): 120 mg via ORAL
  Filled 2021-06-17 (×8): qty 2

## 2021-06-17 MED ORDER — INSULIN GLARGINE-YFGN 100 UNIT/ML ~~LOC~~ SOLN
10.0000 [IU] | Freq: Every day | SUBCUTANEOUS | Status: DC
Start: 2021-06-17 — End: 2021-06-21
  Administered 2021-06-17 – 2021-06-21 (×5): 10 [IU] via SUBCUTANEOUS
  Filled 2021-06-17 (×5): qty 0.1

## 2021-06-17 MED ORDER — BUSPIRONE HCL 10 MG PO TABS
10.0000 mg | ORAL_TABLET | Freq: Two times a day (BID) | ORAL | Status: DC
  Administered 2021-06-17 – 2021-06-19 (×4): 10 mg via ORAL
  Filled 2021-06-17 (×4): qty 1

## 2021-06-17 MED ORDER — POLYVINYL ALCOHOL 1.4 % OP SOLN: 1.0000 [drp] | OPHTHALMIC | Status: DC | PRN

## 2021-06-17 MED ORDER — ASPIRIN EC 81 MG PO TBEC
81.0000 mg | DELAYED_RELEASE_TABLET | Freq: Every day | ORAL | Status: DC
  Administered 2021-06-18 – 2021-06-25 (×8): 81 mg via ORAL
  Filled 2021-06-17 (×8): qty 1

## 2021-06-17 NOTE — ED Provider Notes (Signed)
[facility] EMERGENCY DEPARTMENT Provider Note   CSN: [id number] Arrival date & time: 06/17/21  1226     History Chief Complaint  Patient presents with   Shortness of Breath    Jamie [name] is a 60 y.o. female.  Patient complaining of shortness of breath and cough for 2 to 3 days.  Denies chest pain denies fevers denies vomiting or diarrhea.  She states that she is on 3 L nasal cannula at baseline but cannot do her daily activities of living for the past 2 to 3 days due to severe shortness of breath.  EMS arrived and she was satting about 80 to 85% on her 3 L nasal cannula, she was placed on 15 L and doing much better.      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    CHF (congestive heart failure) (HCC)    Chronic diastolic HF (heart failure) ([place]) 10/21/2019   COPD (chronic obstructive pulmonary disease) (HCC)    Diabetes type 2, uncontrolled (HCC)    Gastritis and duodenitis    HTN (hypertension) 10/21/2019   PAD (peripheral artery disease) ([place]) 10/21/2019   PAD (peripheral artery disease) (HCC)    PNA (pneumonia)    TIA (transient ischemic attack)     Patient Active Problem List   Diagnosis Date Noted   Palliative care by specialist    Acute renal failure ([place])    DNR (do not resuscitate) discussion    Acute respiratory insufficiency    Chronic diastolic HF (heart failure) ([place]) 10/21/2019   HTN (hypertension) 10/21/2019   PAD (peripheral artery disease) ([place]) 10/21/2019   CHF (congestive heart failure) ([place]) 10/21/2019   Acute on chronic diastolic CHF (congestive heart failure) ([place])     Past Surgical History:  Procedure Laterality Date   BELOW KNEE LEG AMPUTATION Right    CORONARY ATHERECTOMY N/A 11/02/2019   Procedure: CORONARY ATHERECTOMY;  Surgeon: Martinique, Peter M, MD;  Location: [facility];  Service: Cardiovascular;  Laterality: N/A;   CORONARY STENT INTERVENTION N/A 11/02/2019   Procedure: CORONARY  STENT INTERVENTION;  Surgeon: Martinique, Peter M, MD;  Location: [facility];  Service: Cardiovascular;  Laterality: N/A;   PRESSURE SENSOR/CARDIOMEMS N/A 10/31/2019   Procedure: PRESSURE SENSOR/CARDIOMEMS;  Surgeon: Larey Dresser, MD;  Location: [facility];  Service: Cardiovascular;  Laterality: N/A;   RIGHT/LEFT HEART CATH AND CORONARY ANGIOGRAPHY N/A 10/31/2019   Procedure: RIGHT/LEFT HEART CATH AND CORONARY ANGIOGRAPHY;  Surgeon: Larey Dresser, MD;  Location: [facility];  Service: Cardiovascular;  Laterality: N/A;     OB History   No obstetric history on file.     Family History  Problem Relation Age of Onset   Hypertension Brother    Diabetes Brother    Hypertension Maternal Grandmother    Heart attack Paternal Grandmother     Social History   Tobacco Use   Smoking status: Never   Smokeless tobacco: Never  Substance Use Topics   Alcohol use: Never   Drug use: Never    Home Medications Prior to Admission medications   Medication Sig Start Date End Date Taking? Authorizing Provider  acetaminophen (TYLENOL) 325 MG tablet Take 650 mg by mouth every 6 (six) hours as needed for headache (pain).    [provider]  albuterol (VENTOLIN HFA) 108 (90 Base) MCG/ACT inhaler Inhale 2 puffs into the lungs every 4 (four) hours as needed for wheezing or  shortness of breath.    [provider]  amLODipine (NORVASC) 10 MG tablet Take 1 tablet (10 mg total) by mouth daily. 11/07/19   Dana Allan I, MD  aspirin EC 81 MG EC tablet Take 1 tablet (81 mg total) by mouth daily. 11/07/19   Bonnell Public, MD  atorvastatin (LIPITOR) 80 MG tablet Take 80 mg by mouth at bedtime.    [provider]  busPIRone (BUSPAR) 5 MG tablet Take 5 mg by mouth every morning.    [provider]  Cholecalciferol 50 MCG (2000 UT) CAPS Take 1 capsule by mouth once a week.    [provider]  clonazePAM (KLONOPIN) 0.25 MG disintegrating  tablet Take 1 tablet (0.25 mg total) by mouth 2 (two) times daily. 11/06/19   Dana Allan I, MD  cloNIDine (CATAPRES) 0.3 MG tablet Take 1 tablet (0.3 mg total) by mouth 2 (two) times daily. 11/06/19   Dana Allan I, MD  clopidogrel (PLAVIX) 75 MG tablet Take 75 mg by mouth daily.    [provider]  dicyclomine (BENTYL) 10 MG capsule Take 10 mg by mouth daily as needed for spasms.    [provider]  fenofibrate (TRICOR) 145 MG tablet Take 145 mg by mouth daily.    [provider]  gabapentin (NEURONTIN) 400 MG capsule Take 400 mg by mouth 3 (three) times daily.    [provider]  hydrALAZINE (APRESOLINE) 25 MG tablet Take 1 tablet (25 mg total) by mouth 3 (three) times daily. 12/15/20 12/15/21  Larey Dresser, MD  insulin aspart (NOVOLOG) 100 UNIT/ML injection Inject 0-9 Units into the skin 3 (three) times daily with meals. 11/06/19   Dana Allan I, MD  insulin glargine (LANTUS) 100 UNIT/ML injection Inject 0.2 mLs (20 Units total) into the skin daily. 11/07/19   Bonnell Public, MD  isosorbide mononitrate (IMDUR) 60 MG 24 hr tablet Take 1.5 tablets (90 mg total) by mouth daily. 11/13/19   Clegg, Amy D, NP  labetalol (NORMODYNE) 300 MG tablet Take 300 mg by mouth every 12 (twelve) hours.    [provider]  meclizine (ANTIVERT) 50 MG tablet Take 1 tablet (50 mg total) by mouth 3 (three) times daily as needed. 05/21/20   Deno Etienne, DO  Multiple Vitamin (MULTIVITAMIN WITH MINERALS) TABS tablet Take 1 tablet by mouth daily. 11/07/19   Bonnell Public, MD  nitroGLYCERIN (NITROSTAT) 0.4 MG SL tablet Place 0.4 mg under the tongue every 5 (five) minutes as needed for chest pain.    [provider]  pantoprazole (PROTONIX) 40 MG tablet Take 40 mg by mouth daily.    [provider]  polyethylene glycol (MIRALAX / GLYCOLAX) 17 g packet Take 17 g by mouth daily as needed (constipation).    [provider]  polyvinyl  alcohol (LIQUIFILM TEARS) 1.4 % ophthalmic solution 1 drop as needed for dry eyes.    [provider]  potassium chloride (KLOR-CON) 10 MEQ tablet Take 10 mEq by mouth daily.    [provider]  senna (SENOKOT) 8.6 MG TABS tablet Take 2 tablets by mouth at bedtime.    [provider]  torsemide (DEMADEX) 20 MG tablet Take 3 tablets (60 mg total) by mouth in the morning AND 2 tablets (40 mg total) every evening. 05/16/20   Larey Dresser, MD  venlafaxine (EFFEXOR) 37.5 MG tablet Take 37.5 mg by mouth daily.    [provider]    Allergies  Codeine, Meperidine and related, and Tetracyclines & related  Review of Systems   Review of Systems  Constitutional:  Negative for fever.  HENT:  Negative for ear pain.   Eyes:  Negative for pain.  Respiratory:  Positive for cough and shortness of breath.   Cardiovascular:  Negative for chest pain.  Gastrointestinal:  Negative for abdominal pain.  Genitourinary:  Negative for flank pain.  Musculoskeletal:  Negative for back pain.  Skin:  Negative for rash.  Neurological:  Negative for headaches.   Physical Exam Updated Vital Signs BP (!) 186/105   Pulse 67   Temp 98.5 F (36.9 C) (Oral)   Resp 18   Ht 5\' 3"  (1.6 m)   SpO2 97%   BMI 30.22 kg/m   Physical Exam Constitutional:      General: She is not in acute distress.    Appearance: Normal appearance.  HENT:     Head: Normocephalic.     Nose: Nose normal.  Eyes:     Extraocular Movements: Extraocular movements intact.  Cardiovascular:     Rate and Rhythm: Normal rate.  Pulmonary:     Effort: Tachypnea present.     Breath sounds: Rhonchi present.  Musculoskeletal:        General: Normal range of motion.     Cervical back: Normal range of motion.  Neurological:     General: No focal deficit present.     Mental Status: She is alert. Mental status is at baseline.    ED Results / Procedures / Treatments   Labs (all labs ordered are listed,  but only abnormal results are displayed) Labs Reviewed  CBC WITH DIFFERENTIAL/PLATELET - Abnormal; Notable for the following components:      Result Value   WBC 12.9 (*)    RBC 2.77 (*)    Hemoglobin 7.4 (*)    HCT 24.0 (*)    Neutro Abs 11.2 (*)    Lymphs Abs 0.5 (*)    Abs Immature Granulocytes 0.15 (*)    All other components within normal limits  BASIC METABOLIC PANEL - Abnormal; Notable for the following components:   Glucose, Bld 254 (*)    BUN 48 (*)    Creatinine, Ser 1.72 (*)    Calcium 8.7 (*)    GFR, Estimated 34 (*)    All other components within normal limits  RESP PANEL BY RT-PCR (FLU A&B, COVID) ARPGX2  CULTURE, BLOOD (ROUTINE X 2)  CULTURE, BLOOD (ROUTINE X 2)  LACTIC ACID, PLASMA  BRAIN NATRIURETIC PEPTIDE  TROPONIN I (HIGH SENSITIVITY)    EKG None  Radiology DG Chest Port 1 View  Result Date: 06/17/2021 CLINICAL DATA:  Shortness of breath EXAM: PORTABLE CHEST 1 VIEW COMPARISON:  Chest x-ray 04/08/2021 FINDINGS: Heart size appears normal. Mediastinum is stable. Calcified plaques in the aortic arch. Moderate to large left pleural effusion with associated atelectasis/infiltrate, increased since previous study. No pneumothorax. IMPRESSION: Moderate to large left pleural effusion with associated atelectasis/infiltrate, persistent and increased since previous study. Electronically Signed   By: Ofilia Neas   On: 06/17/2021 13:21    Procedures Procedures   Medications Ordered in ED Medications - No data to display  ED Course  I have reviewed the triage vital signs and the nursing notes.  Pertinent labs & imaging results that were available during my care of the patient were reviewed by me and considered in my medical decision making (see chart for details).  Clinical Course as of 06/17/21 1446  Wed Jun 17, 2021  1344 DG Chest [place] 1 View [JH]    Clinical Course User Index [JH] Amity, Greggory Brandy, MD   MDM Rules/Calculators/A&P                            White count mildly elevated 12.9 hemoglobin 7.4 chemistry shows mild renal insufficiency.  X-ray concerning for left-sided pleural effusion appears recurrent.  Review of chart shows multiple prior episodes that required thoracentesis.  [facility]ospital [facility] consulted for admission.   Final Clinical Impression(s) / ED Diagnoses Final diagnoses:  Pleural effusion  Anemia, unspecified type    Rx / DC Orders ED Discharge Orders     None        Luna Fuse, MD 06/17/21 1446

## 2021-06-17 NOTE — ED Triage Notes (Signed)
Pt found to be hypoxic and lethargic by SNF staff ([place]). [facility] EMS finds pt to be 80% on her baseline 3LPM via [place]. Placed on NRBM @ 15LPM- arrives to ED 99%. Pt reports feeling 'bad' and states she 'wants to breath'.  94% on 3 LPM via [place] at this time. Flu vaccine given yesterday.

## 2021-06-17 NOTE — Progress Notes (Signed)
  FMTS Attending Admission Note: Yehuda Savannah, MD  Personal pager:  [phone] FPTS Service Pager:  [phone]   I  have seen and examined this patient, reviewed their chart. I have discussed this patient with the resident. I agree with the resident's findings, assessment and care plan.  Ms Pagliaro notes several days of worsening shortness of breath and some chest pressure, but without fevers, chills, sputum production.   All imaging and lab work reviewed by myself.  Acute on chronic hypoxic respiratory failure with recurrent left pleural effusion- pt with worsening SOA, although back on home O2 in ED when I saw her. She does look fluid overloaded, suspect this is due to her recurrent L sided pleural effusion which has been transudative without atypical cells on past thoracenteses. Appreciate cardiology recommendations for diuresis, continue therapeutic thoracentesis if worsening dyspnea or hypoxia. We are monitoring off of antibiotics as she has not had fevers or other infectious symptoms and no signs of sepsis on admission. Acute on Chronic diastolic HF- BNP mildly elevated at 571, diuresing as above. Suspect this is cause of left sided pleural effusion. Chronic normocytic anemia- baseline between 8-9, ferritin 244 c/w anemia of chronic disease, pt without signs/symptoms of bleeding, transfusion threshold 8 due to CAD, will transfuse 1 unit  pRBCs with continued diuresis as above.  Resident H&P to follow.

## 2021-06-17 NOTE — Consult Note (Signed)
Advanced Heart Failure Team Consult Note   Primary Physician: Patient, No Pcp Per (Inactive) PCP-Cardiologist:  None  Reason for Consultation: Shortness of breath   HPI:    Seng Fouts is seen today for evaluation of shortness of breath at the request of Dr. Almyra Free.   Ms Dyck is a 60 y.o. 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   Several hospitalizations at [facility] for acute on chronic respiratory failure, bilateral pleural effusions and acute diastolic HF (2/42/68-10/24/17 and 10/01/19-10/06/19)  + thoracentesis 10/04/19 of 1 L (patient had had 4 other thoracenteses in the prior year).    Admitted 10/21/19 to [facility] with prolonged hospitalization due to COVID-19 PNA, respiratory failure, A/C diastolic HF and CAD with PCI to LAD and RCA. Also had Cardiomems placed given recurrent diastolic CHF.  She now lives at [facility].     She returned 4/22 for f/u. PFTs reordered to investigate her lung disease. She exhibited stable NYHA II symptoms, and she was not volume overloaded. Hydralazine cut back to 25 mg tid due to soft BP.   Cardiomems (11/26/20) PADP 8; deactivated 03/16/21. Says she has reported broken equipment to company.  Echo 03/17/21: EF 60-65% RV normal.   Brought to ER today for 2-3 days of SOB and cough. Says she is SOB at rest and with any exertion. Says she gets SOB even eating. No CP, palpitations, wheezing, fevers or chills. Says she is following her fluid intake but constantly asking for something to drink in the ER. Typically on 3L [place]. When EMS arrived sats 80-85% on 3L. Sats improved with 15L   In ER w/u notable for [id number]. COVID negative  WBC 12.9 CXR with recurrent moderate to large left effusion      PMH: 1. HTN 2.  Hyperlipidemia 3. TIA 4. Type 2 diabetes 5. Carotid stenosis:  - Carotid dopplers (8/21): 60-79% RICA stenosis, stable. 6. PAD: S/p right BKA.  - ABIs (8/21): 0.82 (mild disease) on left.  7. CKD: Stage 3.  8. COPD: On home oxygen 3 L and carries diagnosis of COPD but never smoked.  9. Diabetic neuropathy 10. Renal artery stenosis:  - Renal artery dopplers (3/21): 1-59% bilateral renal artery stenosis.  11. CAD: LHC (3/21)  30% stenosis proximal LAD prior to D1. 50% stenosis LAD at D1. 80-90% stenosis mid LAD. 75% proximal D1 stenosis, moderate-large vessel.  Long 70% stenosis ostial to proximal LCx. Tiny OM1 with diffuse severe disease. Small OM2 with 99% proximal stenosis. 60% stenosis proximal PLOM. Long area of calcified plaque from the proximal into the mid RCA. 90% stenosis proximally, 80% stenosis in the mid RCA. 80% stenosis mid PDA. 70% stenosis mid PLV. - PCI (3/21): Atherectomy + DES to prox/mid RCA, DES mid LAD.  12. Chronic diastolic CHF: Has Cardiomems device.  - Echo (2/21): EF 55-60% with mild concentric LVH, both mean arterial pressure and LVEDP are elevated.  Mild MR and Mild TR. RV systolic pressure is 48 mmHg  - RHC (3/21): mean RA 2, PA 27/10, PCWP mean 13, CI 3.63 13. H/o transudative pleural effusions.  14. COVID-19 PNA: 2/21.   Review of Systems: [y] = yes, [ ]  = no   General: Weight gain [ ] ; Weight loss [ ] ; Anorexia [ ] ; Fatigue [ y]; Fever [ ] ;  Chills [ ] ; Weakness [ y]  Cardiac: Chest pain/pressure [ ] ; Resting SOB Blue.Reese ]; Exertional SOB [ y]; Orthopnea [ ] ; Pedal Edema [ ] ; Palpitations [ ] ; Syncope [ ] ; Presyncope [ ] ; Paroxysmal nocturnal dyspnea[ ]   Pulmonary: Cough [ y]; Wheezing[ ] ; Hemoptysis[ ] ; Sputum [ ] ; Snoring [ ]   GI: Vomiting[ ] ; Dysphagia[ ] ; Melena[ ] ; Hematochezia [ ] ; Heartburn[ ] ; Abdominal pain [ ] ; Constipation [ ] ; Diarrhea [ ] ; BRBPR [ ]   GU: Hematuria[ ] ; Dysuria [ ] ; Nocturia[ ]   Vascular: Pain in legs with walking [ ] ; Pain in feet with  lying flat [ ] ; Non-healing sores [ ] ; Stroke [ ] ; TIA [ ] ; Slurred speech [ ] ;  Neuro: Headaches[ ] ; Vertigo[ ] ; Seizures[ ] ; Paresthesias[ ] ;Blurred vision [ ] ; Diplopia [ ] ; Vision changes [ ]   Ortho/Skin: Arthritis [ y]; Joint pain Blue.Reese ]; Muscle pain [ ] ; Joint swelling [ ] ; Back Pain [ ] ; Rash [ ]   Psych: Depression[ y]; Anxiety[ ]   Heme: Bleeding problems [ ] ; Clotting disorders [ ] ; Anemia [ ]   Endocrine: Diabetes [ y]; Thyroid dysfunction[ ]   Home Medications Prior to Admission medications   Medication Sig Start Date End Date Taking? Authorizing Provider  acetaminophen (TYLENOL) 325 MG tablet Take 650 mg by mouth every 6 (six) hours as needed for headache (pain).    [provider]  albuterol (VENTOLIN HFA) 108 (90 Base) MCG/ACT inhaler Inhale 2 puffs into the lungs every 4 (four) hours as needed for wheezing or shortness of breath.    [provider]  amLODipine (NORVASC) 10 MG tablet Take 1 tablet (10 mg total) by mouth daily. 11/07/19   Dana Allan I, MD  aspirin EC 81 MG EC tablet Take 1 tablet (81 mg total) by mouth daily. 11/07/19   Bonnell Public, MD  atorvastatin (LIPITOR) 80 MG tablet Take 80 mg by mouth at bedtime.    [provider]  busPIRone (BUSPAR) 5 MG tablet Take 5 mg by mouth every morning.    [provider]  Cholecalciferol 50 MCG (2000 UT) CAPS Take 1 capsule by mouth once a week.    [provider]  clonazePAM (KLONOPIN) 0.25 MG disintegrating tablet Take 1 tablet (0.25 mg total) by mouth 2 (two) times daily. 11/06/19   Dana Allan I, MD  cloNIDine (CATAPRES) 0.3 MG tablet Take 1 tablet (0.3 mg total) by mouth 2 (two) times daily. 11/06/19   Dana Allan I, MD  clopidogrel (PLAVIX) 75 MG tablet Take 75 mg by mouth daily.    [provider]  dicyclomine (BENTYL) 10 MG capsule Take 10 mg by mouth daily as needed for spasms.    [provider]  fenofibrate (TRICOR) 145 MG tablet Take 145 mg  by mouth daily.    [provider]  gabapentin (NEURONTIN) 400 MG capsule Take 400 mg by mouth 3 (three) times daily.    [provider]  hydrALAZINE (APRESOLINE) 25 MG tablet Take 1 tablet (25 mg total) by mouth 3 (three) times daily. 12/15/20 12/15/21  Larey Dresser, MD  insulin aspart (NOVOLOG) 100 UNIT/ML injection Inject 0-9 Units into the skin 3 (three) times daily with meals. 11/06/19   Dana Allan I, MD  insulin glargine (LANTUS) 100 UNIT/ML injection Inject 0.2 mLs (20 Units total) into the skin daily. 11/07/19   Bonnell Public, MD  isosorbide mononitrate (IMDUR) 60 MG 24 hr tablet Take 1.5 tablets (90 mg total) by mouth daily. 11/13/19  Clegg, Amy D, NP  labetalol (NORMODYNE) 300 MG tablet Take 300 mg by mouth every 12 (twelve) hours.    [provider]  meclizine (ANTIVERT) 50 MG tablet Take 1 tablet (50 mg total) by mouth 3 (three) times daily as needed. 05/21/20   Deno Etienne, DO  Multiple Vitamin (MULTIVITAMIN WITH MINERALS) TABS tablet Take 1 tablet by mouth daily. 11/07/19   Bonnell Public, MD  nitroGLYCERIN (NITROSTAT) 0.4 MG SL tablet Place 0.4 mg under the tongue every 5 (five) minutes as needed for chest pain.    [provider]  pantoprazole (PROTONIX) 40 MG tablet Take 40 mg by mouth daily.    [provider]  polyethylene glycol (MIRALAX / GLYCOLAX) 17 g packet Take 17 g by mouth daily as needed (constipation).    [provider]  polyvinyl alcohol (LIQUIFILM TEARS) 1.4 % ophthalmic solution 1 drop as needed for dry eyes.    [provider]  potassium chloride (KLOR-CON) 10 MEQ tablet Take 10 mEq by mouth daily.    [provider]  senna (SENOKOT) 8.6 MG TABS tablet Take 2 tablets by mouth at bedtime.    [provider]  torsemide (DEMADEX) 20 MG tablet Take 3 tablets (60 mg total) by mouth in the morning AND 2 tablets (40 mg total) every evening. 05/16/20   Larey Dresser, MD   venlafaxine (EFFEXOR) 37.5 MG tablet Take 37.5 mg by mouth daily.    [provider]    Past Medical History: Past Medical History:  Diagnosis Date   Amputation below knee ([place]) RT    CHF (congestive heart failure) (HCC)    Chronic diastolic HF (heart failure) ([place]) 10/21/2019   COPD (chronic obstructive pulmonary disease) (HCC)    Diabetes type 2, uncontrolled (HCC)    Gastritis and duodenitis    HTN (hypertension) 10/21/2019   PAD (peripheral artery disease) ([place]) 10/21/2019   PAD (peripheral artery disease) (HCC)    PNA (pneumonia)    TIA (transient ischemic attack)     Past Surgical History: Past Surgical History:  Procedure Laterality Date   BELOW KNEE LEG AMPUTATION Right    CORONARY ATHERECTOMY N/A 11/02/2019   Procedure: CORONARY ATHERECTOMY;  Surgeon: Martinique, Peter M, MD;  Location: [place] [facility];  Service: Cardiovascular;  Laterality: N/A;   CORONARY STENT INTERVENTION N/A 11/02/2019   Procedure: CORONARY STENT INTERVENTION;  Surgeon: Martinique, Peter M, MD;  Location: [place] [facility];  Service: Cardiovascular;  Laterality: N/A;   PRESSURE SENSOR/CARDIOMEMS N/A 10/31/2019   Procedure: PRESSURE SENSOR/CARDIOMEMS;  Surgeon: Larey Dresser, MD;  Location: [place] [facility];  Service: Cardiovascular;  Laterality: N/A;   RIGHT/LEFT HEART CATH AND CORONARY ANGIOGRAPHY N/A 10/31/2019   Procedure: RIGHT/LEFT HEART CATH AND CORONARY ANGIOGRAPHY;  Surgeon: Larey Dresser, MD;  Location: [place] [facility];  Service: Cardiovascular;  Laterality: N/A;    Family History: Family History  Problem Relation Age of Onset   Hypertension Brother    Diabetes Brother    Hypertension Maternal Grandmother    Heart attack Paternal Grandmother     Social History: Social History   Socioeconomic History   Marital status: Unknown    Spouse name: Not on file   Number of children: Not on file   Years of education: Not on file   Highest education level: Not on file   Occupational History   Not on file  Tobacco Use   Smoking status: Never   Smokeless tobacco: Never  Substance and Sexual Activity   Alcohol use: Never   Drug use: Never   Sexual activity: Not on file  Other Topics Concern   Not on file  Social History Narrative   Not on file   Social Determinants of Health   Financial Resource Strain: Not on file  Food Insecurity: Not on file  Transportation Needs: Not on file  Physical Activity: Not on file  Stress: Not on file  Social Connections: Not on file    Allergies:  Allergies  Allergen Reactions   Codeine Other (See Comments)    headaches   Meperidine And Related Other (See Comments)    headaches   Tetracyclines & Related Other (See Comments)    Caused yeast infections    Objective:    Vital Signs:   Temp:  [98.5 F (36.9 C)] 98.5 F (36.9 C) (10/26 1240) Pulse Rate:  [65-71] 71 (10/26 1700) Resp:  [13-18] 18 (10/26 1700) BP: (153-186)/(59-105) 172/71 (10/26 1700) SpO2:  [94 %-97 %] 96 % (10/26 1700)    Intake/Output:  No intake or output data in the 24 hours ending 06/17/21 1737    Physical Exam    General:  Chronically-ill appearing. On O2 No resp difficulty HEENT: normal Neck: supple. JVP to ear . Carotids 2+ bilat; no bruits. No lymphadenopathy or thyromegaly appreciated. Cor: PMI nondisplaced. Regular rate & rhythm. No rubs, gallops or murmurs. Lungs: crackles on R markedly decreased 1/2 up on left Abdomen: obese soft, nontender, nondistended. No hepatosplenomegaly. No bruits or masses. Good bowel sounds. Extremities: no cyanosis, clubbing, rash, 1+ edema Neuro: alert & orientedx3, cranial nerves grossly intact. moves all 4 extremities w/o difficulty. Affect pleasant   Telemetry   Sinus 60-70s Personally reviewed   EKG    NSR 69 No ST-T wave abnormalities. Personally reviewed   Labs   Basic Metabolic Panel: Recent Labs  Lab 06/17/21 1238  NA 136  K 4.7  CL 102  CO2 22  GLUCOSE 254*   BUN 48*  CREATININE 1.72*  CALCIUM 8.7*    Liver Function Tests: No results for input(s): AST, ALT, ALKPHOS, BILITOT, PROT, ALBUMIN in the last 168 hours. No results for input(s): LIPASE, AMYLASE in the last 168 hours. No results for input(s): AMMONIA in the last 168 hours.  CBC: Recent Labs  Lab 06/17/21 1238  WBC 12.9*  NEUTROABS 11.2*  HGB 7.4*  HCT 24.0*  MCV 86.6  PLT 279    Cardiac Enzymes: No results for input(s): CKTOTAL, CKMB, CKMBINDEX, TROPONINI in the last 168 hours.  BNP: BNP (last 3 results) Recent Labs    12/15/20 1033 03/17/21 1145  BNP 107.1* 261.8*    ProBNP (last 3 results) No results for input(s): PROBNP in the last 8760 hours.   CBG: Recent Labs  Lab 06/17/21 1711  GLUCAP 208*    Coagulation Studies: No results for input(s): LABPROT, INR in the last 72 hours.   Imaging   DG Chest Port 1 View  Result Date: 06/17/2021 CLINICAL DATA:  Shortness of breath EXAM: PORTABLE CHEST 1 VIEW COMPARISON:  Chest x-ray 04/08/2021 FINDINGS: Heart size appears normal. Mediastinum is stable. Calcified plaques in the aortic arch. Moderate to large left pleural effusion with associated atelectasis/infiltrate, increased since previous study. No pneumothorax. IMPRESSION: Moderate to large left pleural effusion with associated atelectasis/infiltrate, persistent and increased since previous study. Electronically Signed   By: Ofilia Neas   On: 06/17/2021 13:21     Medications:    Current Outpatient Medications  Medication Instructions   acetaminophen (TYLENOL) 650 mg, Oral, Every 6 hours PRN   albuterol (VENTOLIN HFA) 108 (90 Base) MCG/ACT inhaler 2 puffs, Inhalation, Every 4 hours PRN   amLODipine (NORVASC) 10 mg, Oral, Daily   aspirin 81 mg, Oral, Daily   atorvastatin (LIPITOR) 80 mg, Oral, Daily at bedtime   busPIRone (BUSPAR) 5 mg, Oral, Every morning   Cholecalciferol 50 MCG (2000 UT) CAPS 1 capsule, Oral, Weekly   clonazePAM (KLONOPIN) 0.25  mg, Oral, 2 times daily   cloNIDine (CATAPRES) 0.3 mg, Oral, 2 times daily   clopidogrel (PLAVIX) 75 mg, Oral, Daily   dicyclomine (BENTYL) 10 mg, Oral, Daily PRN   fenofibrate (TRICOR) 145 mg, Oral, Daily   gabapentin (NEURONTIN) 400 mg, Oral, 3 times daily   hydrALAZINE (APRESOLINE) 25 mg, Oral, 3 times daily   insulin aspart (NOVOLOG) 0-9 Units, Subcutaneous, 3 times daily with meals   insulin glargine (LANTUS) 20 Units, Subcutaneous, Daily   isosorbide mononitrate (IMDUR) 90 mg, Oral, Daily   labetalol (NORMODYNE) 300 mg, Oral, Every 12 hours   meclizine (ANTIVERT) 50 mg, Oral, 3 times daily PRN   Multiple Vitamin (MULTIVITAMIN WITH MINERALS) TABS tablet 1 tablet, Oral, Daily   nitroGLYCERIN (NITROSTAT) 0.4 mg, Sublingual, Every 5 min PRN   pantoprazole (PROTONIX) 40 mg, Oral, Daily   polyethylene glycol (MIRALAX / GLYCOLAX) 17 g, Oral, Daily PRN   polyvinyl alcohol (LIQUIFILM TEARS) 1.4 % ophthalmic solution 1 drop, As needed   potassium chloride (KLOR-CON) 10 MEQ tablet 10 mEq, Oral, Daily   senna (SENOKOT) 8.6 MG TABS tablet 2 tablets, Oral, Daily at bedtime   torsemide (DEMADEX) 20 MG tablet Take 3 tablets (60 mg total) by mouth in the morning AND 2 tablets (40 mg total) every evening.   venlafaxine (EFFEXOR) 37.5 mg, Oral, Daily      Assessment/Plan   1. Acute on chronic hypoxemic respiratory failure: 09/2019 COVID-19 viral pneumonia. She has been on home oxygen 3L [place] and said to have COPD but has never smoked.   -  Suspect main issue here is combination of volume overload and recurrent L effusion  - Start lasix 80 IV bid - If effusion does not improve with diuresis will need IR thoracentesis +/- Pleruex 2. Acute/chronic diastolic CHF: Multiple admissions with hypertensive urgency/diastolic CHF. Echo in 2/21 with EF 55-60%, normal RV, IVC suggesting RA pressure 8 mmHg.  [place] 3/21 showed optimized filling pressures with normal cardiac output.  With multiple diastolic CHF  admissions, we placed a Cardiomems device. Probably NYHA class II-early III (able to wheel her wheelchair some today in clinic), she is not volume overloaded by exam or REDS clip.  She is unable to stand for weight today. - Echo 7/22 EF 65% - Volume overloaded on exam - Check BNP.  - Diurese with IV lasix - Consider adding Entresto 3. Pleural effusions: 09/2019 Had [facility]eses [facility] and again bil[facility] at [facility] in 3/21. Transudates with negative cytology. Suspect due to CHF.  - Has recurrent L pleural effusion - Start lasix 80 IV bid - If effusion does not improve with diuresis will need IR thoracentesis +/- Pleruex 4. HTN: BP elevated today Renal artery dopplers in 3/21 with nonobstructive disease.  - Continue amlodipine 10 mg daily.  - Continue hydralazine 25 mg tid.  - Continue labetalol 300 mg bid.  - Continue clonidine 0.3 bid.   - Consider adding Entrestos 5. CKD: Stage 3.  Suspect diabetic nephropathy.  - BMET today.  6. Carotid  stenosis: 8/21 moderate RICA stenosis.  - Repeat carotids in 8/22, will order today.  7. Odynophagia: Also with GERD symptoms.  EGD (6/22) with no obvious esophageal mechanical obstruction, likely benign fundic gland polyps, cannot exclude esophageal dysmotility and gastroparesis. 8. DM: Per [place] 9. CAD: In 3/21, she had atherectomy/PCI to prox/mid LAD and PCI mid LAD. For now, no intervention on moderate LCx disease. She has chest discomfort after she eats.  - Continue Imdur 90 mg daily.  - Continue ASA 81 + Plavix 75.   - Continue atorvastatin 80 mg daily. Lipids checked (4/22). - Eventually, would transition from Plavix to rivaroxaban 2.5 mg bid given CAD and PAD.  10: PAD: S/p R BKA.  Mildly decreased ABI on left in 8/21.   - Continue statin, ASA, Plavix.  - Repeat peripheral arterial dopplers (5/22) showed mild left lower arterial disease.     Length of Stay: 0  Glori Bickers, MD  06/17/2021, 5:37 PM  Advanced Heart Failure  Team Pager [phone] (M-F; 7a - 5p)  Please contact [facility] for night-coverage after hours (4p -7a ) and weekends on [email or web address]

## 2021-06-17 NOTE — H&P (Addendum)
[facility] Admission History and Physical Service Pager: [phone]  Patient name: Jamie [name] Medical record number: [id number] Date of birth: [birth year] Age: 60 y.o. Gender: female  Primary Care Provider: Patient, No Pcp Per (Inactive) Consultants: cards Code Status: Full  Preferred Emergency Contact: Coralie Common [phone]  Chief Complaint: shortness of breath  Assessment and Plan: Jamie [name] is a 60 y.o. female presenting with shortness of breath secondary to left side pleural effusion. PMH is significant for recurrent pleural effusions, CHF, COPD, type 2 diabetes, HTN, PAD, right BKA, CKD, GERD  Acute on chronic hypoxic respiratory failure Patient has had multiple hospitalizations for acute on chronic respiratory failure over the past two years at both [facility] and [facility].  She was last hospitalized at [facility] from 10/20/2020-11/06/2019 with acute hypoxemic respiratory failure secondary to CHF, CAD, and recurrent bilateral pleural effusions requiring thoracentesis which showed transudative pleural effusion.  Today she complains of shortness of breath, chest pain, and severe fatigue for the past few days which have worsened significantly since this morning.  Per EMS, she was satting in the low 80s on her baseline of 3 L via [place].  She was placed on 15 L with improved saturations.  Currently she is on her baseline of 3 L [place] with oxygen saturations in the mid 90s.  CXR (1 view) shows moderate to large left pleural effusion with associated atelectasis/infiltrate.  She is afebrile with WBC of 12.9 and lactic acid is WNL.  Blood cultures were drawn in ED.  No significant ST segment changes on EKG and troponin in ED was 17.  Her shortness of breath is unlikely to be related to CAD. Will consider pneumonia on the differential especially as WBC is slightly elevated.  However, she remains afebrile. Low threshold for starting antibiotics if she fevers.  Consider  pulmonary embolism on the differential as patient is short of breath with chest pain.  However, she states her chest pain is very intermittent she is not tachycardic.  We will consider obtaining CTA.  It is likely her shortness of breath is due to combination of CHF (as patient has 1+ pitting edema of BLEs and mild JVD) and large left pleural effusion.  Breath sounds are diminished at bilateral bases with faint crackles. -Admit to family practice teaching service, admitting attending Dr. Thompson Grayer -Pulm consult in a.m. -Cardiology consult, appreciate recommendations -IV Lasix 80 mg twice daily -IR thoracentesis if effusion does not improve with diuresis per cardiology -CXR 2 views, shows left effusion with possible loculation -AM CBC -A.m. CMP -BNP -PT/OT eval and treat -Flutter valve as needed -Keep O2 saturation > 92% -Continuous cardiac monitoring -Follow-up on blood cultures -Follow-up on MRSA PCR, Nasal -Low threshold to start antibiotic coverage for CAP if develops fever  Diastolic CHF ECHO 34/1962 shows EF of 60 to 65% with normal LV function.  Home medications include Imdur 90 mg daily, torsemide 60 mg in the morning and 40 mg in the evening, and potassium 10 mEq daily.  Patient does appear to be fluid overloaded currently with mild JVD, 1+ pitting edema BLEs -BNP -Furosemide 80 mg IV twice daily -Consider adding Entresto per cardiology -Daily weights -Strict I's and O's  CAD  PAD During last hospitalization from 10/20/2020-11/06/2019 pt had cardiac catheterization significant for three-vessel disease.  She had coronary arthrectomy and PCI to right coronary artery and PCI to mid LAD.  Medications include aspirin 81 mg daily, atorvastatin 80 mg at bedtime, Plavix 75 mg daily,  Imdur 90 mg daily and Nitrostat 0.4 mg as needed. -Continue Imdur 90 mg daily -Continue aspirin 81 mg daily -Continue Plavix 75 mg daily -Continue atorvastatin 80 mg daily  Normocytic anemia This is likely  anemia of chronic disease.  Patient has hemoglobin of 7.4 today with a baseline of 8.5 -1 unit RBCs -Threshold <8 for CAD -F/U post transfusion H&H -ferritin labs  COPD Patient has history of COPD on problem list but per patient does not smoke and has never smoked.  Home medication includes Advair twice daily.  Type 2 DM Blood glucose on BMP today is 254.  Hemoglobin A1c from 10/23/2019 of 6.9.  Was taking detemir at SNF, waiting on med rec -HgbA1c -Semglee 10 units daily -Moderate SSI -Monitor CBG before meals and at bedtime  HTN Blood pressure elevated up to 186/105 upon admission.  Home medications include amlodipine, hydralazine, labetalol, clonidine. -Continue home amlodipine 10 mg daily -Continue home hydralazine 25 mg 3 times daily -Continue home labetalol 300 mg twice daily -Continue home clonidine 0.3 mg twice daily  AKI on CKD stage IIIb Creatinine today of 1.72 with baseline in the low-mid 1's, GFR 34 today.  AKI likely due to volume overload. -Continue diuresis -Hold nephrotoxic agents -AM CMP  Anxiety  Her medications include Klonopin 0.25 mg twice daily, BuSpar 5 mg, Effexor 37.5 mg daily daily -Continue home Klonopin, Effexor and BuSpar  Rt BKA Wheelchair bound.  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 female presenting with shortness of breath, intermittent chest pain, extreme fatigue. PMH is significant for recurrent pleural effusions, CHF, COPD, type 2 diabetes, HTN, PAD, right BKA, CKD, GERD  Patient states she "felt like crap today".  Started over the past few days but today fatigue, weakness, and shortness of breath were significantly worse. Admits to intermittent chest pain that feels like pressure.  Pt reports hypoglycenmia to 46 this am. Admits to headache today which started yesterday, described as dull ache that has  worsened today. She denies fevers, chills, nausea or vomiting but states she has a history of gastroparesis. No sick contacts. Had Flu vaccine yesterday and took her Medications this morning. No recent antibiotics, no falls. She states she has had Fluid drained from her lungs before d/t pleural effusions but this feels different. She denies  EtOH use, tobacco, thc, recreational drugs.   Review Of Systems: Per HPI with the following additions:    Review of Systems  Constitutional:  Negative for appetite change, chills and fever.  Respiratory:  Positive for shortness of breath. Negative for cough and wheezing.   Cardiovascular:  Positive for chest pain. Negative for leg swelling.  Gastrointestinal:  Negative for abdominal pain, blood in stool, constipation, diarrhea, nausea and vomiting.  Genitourinary:  Negative for difficulty urinating, hematuria and vaginal bleeding.  Skin:  Negative for pallor.  Neurological:  Positive for weakness and headaches. Negative for dizziness, syncope and speech difficulty.  Psychiatric/Behavioral:  The patient is nervous/anxious.     Patient Active Problem List   Diagnosis Date Noted   Palliative care by specialist    Acute renal failure [facility])    DNR (do not resuscitate) discussion    Acute respiratory insufficiency    Chronic diastolic HF (heart failure) ([place]) 10/21/2019   HTN (hypertension) 10/21/2019   PAD (peripheral artery disease) ([place]) 10/21/2019   CHF (congestive heart failure) (  [place]) 10/21/2019   Acute on chronic diastolic CHF (congestive heart failure) ([place])     Past Medical History: Past Medical History:  Diagnosis Date   Amputation below knee ([place]) RT    CHF (congestive heart failure) (HCC)    Chronic diastolic HF (heart failure) ([place]) 10/21/2019   COPD (chronic obstructive pulmonary disease) (HCC)    Diabetes type 2, uncontrolled (HCC)    Gastritis and duodenitis    HTN (hypertension) 10/21/2019   PAD (peripheral artery disease) ([place])  10/21/2019   PAD (peripheral artery disease) (HCC)    PNA (pneumonia)    TIA (transient ischemic attack)     Past Surgical History: Past Surgical History:  Procedure Laterality Date   BELOW KNEE LEG AMPUTATION Right    CORONARY ATHERECTOMY N/A 11/02/2019   Procedure: CORONARY ATHERECTOMY;  Surgeon: Martinique, Peter M, MD;  Location: [place] [facility];  Service: Cardiovascular;  Laterality: N/A;   CORONARY STENT INTERVENTION N/A 11/02/2019   Procedure: CORONARY STENT INTERVENTION;  Surgeon: Martinique, Peter M, MD;  Location: [place] [facility];  Service: Cardiovascular;  Laterality: N/A;   PRESSURE SENSOR/CARDIOMEMS N/A 10/31/2019   Procedure: PRESSURE SENSOR/CARDIOMEMS;  Surgeon: Larey Dresser, MD;  Location: [place] [facility];  Service: Cardiovascular;  Laterality: N/A;   RIGHT/LEFT HEART CATH AND CORONARY ANGIOGRAPHY N/A 10/31/2019   Procedure: RIGHT/LEFT HEART CATH AND CORONARY ANGIOGRAPHY;  Surgeon: Larey Dresser, MD;  Location: [place] CV [facility];  Service: Cardiovascular;  Laterality: N/A;    Social History: Social History   Tobacco Use   Smoking status: Never   Smokeless tobacco: Never  Substance Use Topics   Alcohol use: Never   Drug use: Never     Family History: Family History  Problem Relation Age of Onset   Hypertension Brother    Diabetes Brother    Hypertension Maternal Grandmother    Heart attack Paternal Grandmother      Allergies and Medications: Allergies  Allergen Reactions   Codeine Other (See Comments)    headaches   Meperidine And Related Other (See Comments)    headaches   Tetracyclines & Related Other (See Comments)    Caused yeast infections   No current facility-administered medications on file prior to encounter.   Current Outpatient Medications on File Prior to Encounter  Medication Sig Dispense Refill   acetaminophen (TYLENOL) 325 MG tablet Take 650 mg by mouth every 6 (six) hours as needed for headache (pain).     albuterol (VENTOLIN  HFA) 108 (90 Base) MCG/ACT inhaler Inhale 2 puffs into the lungs every 4 (four) hours as needed for wheezing or shortness of breath.     amLODipine (NORVASC) 10 MG tablet Take 1 tablet (10 mg total) by mouth daily. 30 tablet 0   aspirin EC 81 MG EC tablet Take 1 tablet (81 mg total) by mouth daily. 30 tablet 0   atorvastatin (LIPITOR) 80 MG tablet Take 80 mg by mouth at bedtime.     busPIRone (BUSPAR) 5 MG tablet Take 5 mg by mouth every morning.     Cholecalciferol 50 MCG (2000 UT) CAPS Take 1 capsule by mouth once a week.     clonazePAM (KLONOPIN) 0.25 MG disintegrating tablet Take 1 tablet (0.25 mg total) by mouth 2 (two) times daily. 60 tablet 0   cloNIDine (CATAPRES) 0.3 MG tablet Take 1 tablet (0.3 mg total) by mouth 2 (two) times daily. 60 tablet 0   clopidogrel (PLAVIX) 75 MG tablet Take 75 mg by mouth  daily.     dicyclomine (BENTYL) 10 MG capsule Take 10 mg by mouth daily as needed for spasms.     fenofibrate (TRICOR) 145 MG tablet Take 145 mg by mouth daily.     gabapentin (NEURONTIN) 400 MG capsule Take 400 mg by mouth 3 (three) times daily.     hydrALAZINE (APRESOLINE) 25 MG tablet Take 1 tablet (25 mg total) by mouth 3 (three) times daily. 270 tablet 3   insulin aspart (NOVOLOG) 100 UNIT/ML injection Inject 0-9 Units into the skin 3 (three) times daily with meals. 10 mL 11   insulin glargine (LANTUS) 100 UNIT/ML injection Inject 0.2 mLs (20 Units total) into the skin daily. 10 mL 11   isosorbide mononitrate (IMDUR) 60 MG 24 hr tablet Take 1.5 tablets (90 mg total) by mouth daily. 45 tablet 0   labetalol (NORMODYNE) 300 MG tablet Take 300 mg by mouth every 12 (twelve) hours.     meclizine (ANTIVERT) 50 MG tablet Take 1 tablet (50 mg total) by mouth 3 (three) times daily as needed. 30 tablet 0   Multiple Vitamin (MULTIVITAMIN WITH MINERALS) TABS tablet Take 1 tablet by mouth daily. 30 tablet 0   nitroGLYCERIN (NITROSTAT) 0.4 MG SL tablet Place 0.4 mg under the tongue every 5 (five)  minutes as needed for chest pain.     pantoprazole (PROTONIX) 40 MG tablet Take 40 mg by mouth daily.     polyethylene glycol (MIRALAX / GLYCOLAX) 17 g packet Take 17 g by mouth daily as needed (constipation).     polyvinyl alcohol (LIQUIFILM TEARS) 1.4 % ophthalmic solution 1 drop as needed for dry eyes.     potassium chloride (KLOR-CON) 10 MEQ tablet Take 10 mEq by mouth daily.     senna (SENOKOT) 8.6 MG TABS tablet Take 2 tablets by mouth at bedtime.     torsemide (DEMADEX) 20 MG tablet Take 3 tablets (60 mg total) by mouth in the morning AND 2 tablets (40 mg total) every evening. 270 tablet 3   venlafaxine (EFFEXOR) 37.5 MG tablet Take 37.5 mg by mouth daily.      Objective: BP (!) 186/75   Pulse 66   Temp 98.5 F (36.9 C) (Oral)   Resp 16   Ht 5\' 3"  (1.6 m)   SpO2 96%   BMI 30.22 kg/m  Exam: General: 60 year old female, appearing older than stated age, lying supine in bed, NAD Eyes: White sclera, conjunctival pallor bilaterally ENTM: Dry MMM Neck: Mild JVD Cardiovascular: RRR, normal S1/S2, no murmurs Respiratory: Diminished breath sounds at bilateral lung bases, worse on the left with faint crackles on bilateral lung bases Gastrointestinal: Normal bowel sounds, soft, nontender, nondistended Extremities: 1+ pitting edema BLEs Neuro: Alert and oriented, no focal deficits Psych: Patient is very tearful, mood and affect are appropriate for situation  Labs and Imaging: CBC BMET  Recent Labs  Lab 06/17/21 1238  WBC 12.9*  HGB 7.4*  HCT 24.0*  PLT 279   Recent Labs  Lab 06/17/21 1238  NA 136  K 4.7  CL 102  CO2 22  BUN 48*  CREATININE 1.72*  GLUCOSE 254*  CALCIUM 8.7*     EKG: My own interpretation (not copied from electronic read)   Normal sinus rhythm, 69 bpm, QTC 462  Donnetta Simpers, DO 06/17/2021, 2:32 PM PGY-1, [place] Intern pager: [phone], text pages welcome    FPTS Upper-Level Resident Addendum   I have independently  interviewed and examined the patient. I  have discussed the above with the original author and agree with their documentation.  Please see also any attending notes.    Carollee Leitz, MD PGY-3, [facility] 06/17/2021 8:11 PM  FPTS Service pager: [phone] (text pages welcome through [place])

## 2021-06-17 NOTE — ED Notes (Signed)
2W Called to attempt report.

## 2021-06-18 ENCOUNTER — Other Ambulatory Visit (HOSPITAL_COMMUNITY): Payer: Self-pay

## 2021-06-18 ENCOUNTER — Observation Stay (HOSPITAL_COMMUNITY): Payer: Medicare Other

## 2021-06-18 ENCOUNTER — Encounter (HOSPITAL_COMMUNITY): Payer: Medicaid Other | Admitting: Cardiology

## 2021-06-18 DIAGNOSIS — Z8616 Personal history of COVID-19: Secondary | ICD-10-CM | POA: Diagnosis not present

## 2021-06-18 DIAGNOSIS — I5033 Acute on chronic diastolic (congestive) heart failure: Secondary | ICD-10-CM | POA: Diagnosis present

## 2021-06-18 DIAGNOSIS — R0689 Other abnormalities of breathing: Secondary | ICD-10-CM | POA: Diagnosis not present

## 2021-06-18 DIAGNOSIS — Z993 Dependence on wheelchair: Secondary | ICD-10-CM | POA: Diagnosis not present

## 2021-06-18 DIAGNOSIS — Z89511 Acquired absence of right leg below knee: Secondary | ICD-10-CM | POA: Diagnosis not present

## 2021-06-18 DIAGNOSIS — N179 Acute kidney failure, unspecified: Secondary | ICD-10-CM | POA: Diagnosis present

## 2021-06-18 DIAGNOSIS — R0602 Shortness of breath: Secondary | ICD-10-CM | POA: Diagnosis present

## 2021-06-18 DIAGNOSIS — Z79899 Other long term (current) drug therapy: Secondary | ICD-10-CM | POA: Diagnosis not present

## 2021-06-18 DIAGNOSIS — J449 Chronic obstructive pulmonary disease, unspecified: Secondary | ICD-10-CM | POA: Diagnosis present

## 2021-06-18 DIAGNOSIS — F321 Major depressive disorder, single episode, moderate: Secondary | ICD-10-CM | POA: Diagnosis present

## 2021-06-18 DIAGNOSIS — I2510 Atherosclerotic heart disease of native coronary artery without angina pectoris: Secondary | ICD-10-CM | POA: Diagnosis present

## 2021-06-18 DIAGNOSIS — Z515 Encounter for palliative care: Secondary | ICD-10-CM | POA: Diagnosis not present

## 2021-06-18 DIAGNOSIS — N1832 Chronic kidney disease, stage 3b: Secondary | ICD-10-CM | POA: Diagnosis present

## 2021-06-18 DIAGNOSIS — I5031 Acute diastolic (congestive) heart failure: Secondary | ICD-10-CM | POA: Diagnosis not present

## 2021-06-18 DIAGNOSIS — K5900 Constipation, unspecified: Secondary | ICD-10-CM | POA: Diagnosis present

## 2021-06-18 DIAGNOSIS — J90 Pleural effusion, not elsewhere classified: Secondary | ICD-10-CM

## 2021-06-18 DIAGNOSIS — Z20822 Contact with and (suspected) exposure to covid-19: Secondary | ICD-10-CM | POA: Diagnosis present

## 2021-06-18 DIAGNOSIS — Z9861 Coronary angioplasty status: Secondary | ICD-10-CM | POA: Diagnosis not present

## 2021-06-18 DIAGNOSIS — E1151 Type 2 diabetes mellitus with diabetic peripheral angiopathy without gangrene: Secondary | ICD-10-CM | POA: Diagnosis present

## 2021-06-18 DIAGNOSIS — I160 Hypertensive urgency: Secondary | ICD-10-CM | POA: Diagnosis present

## 2021-06-18 DIAGNOSIS — E1122 Type 2 diabetes mellitus with diabetic chronic kidney disease: Secondary | ICD-10-CM | POA: Diagnosis present

## 2021-06-18 DIAGNOSIS — J9621 Acute and chronic respiratory failure with hypoxia: Secondary | ICD-10-CM | POA: Diagnosis present

## 2021-06-18 DIAGNOSIS — I130 Hypertensive heart and chronic kidney disease with heart failure and stage 1 through stage 4 chronic kidney disease, or unspecified chronic kidney disease: Secondary | ICD-10-CM | POA: Diagnosis present

## 2021-06-18 DIAGNOSIS — Z7902 Long term (current) use of antithrombotics/antiplatelets: Secondary | ICD-10-CM | POA: Diagnosis not present

## 2021-06-18 DIAGNOSIS — Z7982 Long term (current) use of aspirin: Secondary | ICD-10-CM | POA: Diagnosis not present

## 2021-06-18 DIAGNOSIS — E1165 Type 2 diabetes mellitus with hyperglycemia: Secondary | ICD-10-CM | POA: Diagnosis present

## 2021-06-18 DIAGNOSIS — J9811 Atelectasis: Secondary | ICD-10-CM | POA: Diagnosis present

## 2021-06-18 DIAGNOSIS — Z66 Do not resuscitate: Secondary | ICD-10-CM | POA: Diagnosis present

## 2021-06-18 DIAGNOSIS — D638 Anemia in other chronic diseases classified elsewhere: Secondary | ICD-10-CM | POA: Diagnosis present

## 2021-06-18 LAB — COMPREHENSIVE METABOLIC PANEL
ALT: 13 U/L (ref 0–44)
AST: 15 U/L (ref 15–41)
Albumin: 2.6 g/dL — ABNORMAL LOW (ref 3.5–5.0)
Alkaline Phosphatase: 72 U/L (ref 38–126)
Anion gap: 6 (ref 5–15)
BUN: 43 mg/dL — ABNORMAL HIGH (ref 6–20)
CO2: 28 mmol/L (ref 22–32)
Calcium: 8.6 mg/dL — ABNORMAL LOW (ref 8.9–10.3)
Chloride: 103 mmol/L (ref 98–111)
Creatinine, Ser: 1.98 mg/dL — ABNORMAL HIGH (ref 0.44–1.00)
GFR, Estimated: 28 mL/min — ABNORMAL LOW (ref >60)
Glucose, Bld: 184 mg/dL — ABNORMAL HIGH (ref 70–99)
Potassium: 4.2 mmol/L (ref 3.5–5.1)
Sodium: 137 mmol/L (ref 135–145)
Total Bilirubin: 0.7 mg/dL (ref 0.3–1.2)
Total Protein: 6 g/dL — ABNORMAL LOW (ref 6.5–8.1)

## 2021-06-18 LAB — PROCALCITONIN: Procalcitonin: 0.28 ng/mL

## 2021-06-18 LAB — BLOOD GAS, ARTERIAL
Acid-Base Excess: 1.3 mmol/L (ref 0.0–2.0)
Bicarbonate: 25.8 mmol/L (ref 20.0–28.0)
FIO2: 100
O2 Saturation: 82.8 %
Patient temperature: 37
pCO2 arterial: 44.7 mmHg (ref 32.0–48.0)
pH, Arterial: 7.379 (ref 7.350–7.450)
pO2, Arterial: 51.5 mmHg — ABNORMAL LOW (ref 83.0–108.0)

## 2021-06-18 LAB — CBC
HCT: 24.8 % — ABNORMAL LOW (ref 36.0–46.0)
Hemoglobin: 8.2 g/dL — ABNORMAL LOW (ref 12.0–15.0)
MCH: 27.6 pg (ref 26.0–34.0)
MCHC: 33.1 g/dL (ref 30.0–36.0)
MCV: 83.5 fL (ref 80.0–100.0)
Platelets: 262 10*3/uL (ref 150–400)
RBC: 2.97 MIL/uL — ABNORMAL LOW (ref 3.87–5.11)
RDW: 14.4 % (ref 11.5–15.5)
WBC: 10.5 10*3/uL (ref 4.0–10.5)
nRBC: 0 % (ref 0.0–0.2)

## 2021-06-18 LAB — TYPE AND SCREEN
ABO/RH(D): B POS
Antibody Screen: NEGATIVE
Unit division: 0

## 2021-06-18 LAB — BPAM RBC
Blood Product Expiration Date: 202211062359
ISSUE DATE / TIME: 202210262248
Unit Type and Rh: 7300

## 2021-06-18 LAB — GLUCOSE, CAPILLARY
Glucose-Capillary: 141 mg/dL — ABNORMAL HIGH (ref 70–99)
Glucose-Capillary: 168 mg/dL — ABNORMAL HIGH (ref 70–99)
Glucose-Capillary: 197 mg/dL — ABNORMAL HIGH (ref 70–99)
Glucose-Capillary: 211 mg/dL — ABNORMAL HIGH (ref 70–99)
Glucose-Capillary: 217 mg/dL — ABNORMAL HIGH (ref 70–99)

## 2021-06-18 LAB — BRAIN NATRIURETIC PEPTIDE: B Natriuretic Peptide: 650.6 pg/mL — ABNORMAL HIGH (ref 0.0–100.0)

## 2021-06-18 MED ORDER — FUROSEMIDE 10 MG/ML IJ SOLN
INTRAMUSCULAR | Status: AC
  Administered 2021-06-18: 80 mg via INTRAVENOUS
  Filled 2021-06-18: qty 8

## 2021-06-18 MED ORDER — FUROSEMIDE 10 MG/ML IJ SOLN: 80.0000 mg | Freq: Once | INTRAMUSCULAR | Status: AC

## 2021-06-18 MED ORDER — VENLAFAXINE HCL ER 75 MG PO CP24
75.0000 mg | ORAL_CAPSULE | Freq: Every day | ORAL | Status: DC
  Administered 2021-06-18 – 2021-06-20 (×3): 75 mg via ORAL
  Filled 2021-06-18 (×3): qty 1

## 2021-06-18 MED ORDER — SODIUM CHLORIDE 0.9 % IV SOLN
2.0000 g | Freq: Every day | INTRAVENOUS | Status: DC
  Administered 2021-06-18: 2 g via INTRAVENOUS
  Filled 2021-06-18: qty 20

## 2021-06-18 MED ORDER — CLONAZEPAM 0.25 MG PO TBDP
0.2500 mg | ORAL_TABLET | Freq: Two times a day (BID) | ORAL | Status: DC
  Administered 2021-06-18 – 2021-06-25 (×14): 0.25 mg via ORAL
  Filled 2021-06-18 (×14): qty 1

## 2021-06-18 MED ORDER — AZITHROMYCIN 250 MG PO TABS
500.0000 mg | ORAL_TABLET | Freq: Every day | ORAL | Status: DC
  Administered 2021-06-18: 500 mg via ORAL
  Filled 2021-06-18: qty 2

## 2021-06-18 NOTE — Evaluation (Signed)
Occupational Therapy Evaluation Patient Details Name: Jamie [name] MRN: [id number] DOB: 10-Nov-1960 Today's Date: 06/18/2021   History of Present Illness Pt is a 60 y/o female admitted on 10/26 with shortness of breath due to L sided pleural effusion.  PMH is significant for recurrent pleural effusions, CHF, COPD, type 2 diabetes, HTN, PAD, right BKA, CKD.Pt is a 60 y/o female admitted on 10/26 with shortness of breath due to L sided pleural effusion.  PMH is significant for recurrent pleural effusions, CHF, COPD, type 2 diabetes, HTN, PAD, right BKA, CKD.   Clinical Impression   PTA patient reports transferring into wc with independence and completing most ADLs with independence, residing at [facility] and not receiving therapy currently.  She reports being there "for a long time", through chart review looks like since 09/2019.  Patient supine in bed and agreeable to OT today, lethargic and labile during session.  Requires total assist +2 to reposition in bed, but min to supervision to pull self into long sitting.  She requires up to mod assist for UB ADLs and total assist for LB ADLs.  Anticipate self limiting during evaluation due to feeling down and labile. Believe pt will benefit from further OT services while admitted and after dc to optimize independence and return to PLOF. Will follow acutely.      Recommendations for follow up therapy are one component of a multi-disciplinary discharge planning process, led by the attending physician.  Recommendations may be updated based on patient status, additional functional criteria and insurance authorization.   Follow Up Recommendations  Skilled nursing-short term rehab (<3 hours/day) (pending progress (limited eval))    Assistance Recommended at Discharge Frequent or constant Supervision/Assistance  Functional Status Assessment  Patient has had a recent decline in their functional status and demonstrates the ability to make significant improvements  in function in a reasonable and predictable amount of time.  Equipment Recommendations  Other (comment) (TBD)    Recommendations for Other Services Other (comment) (palliative care, chaplin services)     Precautions / Restrictions Precautions Precautions: Fall Restrictions Weight Bearing Restrictions: No      Mobility Bed Mobility Overal bed mobility: Needs Assistance             General bed mobility comments: pt requires +2 total assist to scoot up in bed, once upright able to pull on rails to long sitting with min assist x 1 and supervision x 1    Transfers                   General transfer comment: deferred pt declined      Balance Overall balance assessment: Needs assistance     Sitting balance - Comments: long sitting in bed with supervision and 0 hand support statically                                   ADL either performed or assessed with clinical judgement   ADL Overall ADL's : Needs assistance/impaired     Grooming: Moderate assistance;Sitting   Upper Body Bathing: Moderate assistance;Sitting   Lower Body Bathing: Maximal assistance;Bed level   Upper Body Dressing : Moderate assistance;Bed level   Lower Body Dressing: Total assistance;Bed level     Toilet Transfer Details (indicate cue type and reason): deferred, pt declined           General ADL Comments: pt limited by weakness, decreased activity tolerance; self  limiting today and labile     Vision Baseline Vision/History: 0 No visual deficits       Perception     Praxis      Pertinent Vitals/Pain Pain Assessment: No/denies pain     Hand Dominance Right   Extremity/Trunk Assessment Upper Extremity Assessment Upper Extremity Assessment: Generalized weakness   Lower Extremity Assessment Lower Extremity Assessment: Defer to PT evaluation       Communication Communication Communication: No difficulties   Cognition Arousal/Alertness:  Lethargic Behavior During Therapy: Flat affect (labile) Overall Cognitive Status: Within Functional Limits for tasks assessed                                 General Comments: appears WFL, pt labile and reports "I'm going to die" with MD  present. Pt appears down about being in SNF with poor quality of life, but reports she can't go home "because she is scared something may happen"     General Comments  VSS on 4L [place] 97%    Exercises     Shoulder Instructions      Home Living Family/patient expects to be discharged to:: Skilled nursing facility                                 Additional Comments: requires 3L at baseline; been staying at [facility] (per chart review since 09/2019)      Prior Functioning/Environment Prior Level of Function : Independent/Modified Independent             Mobility Comments: pt reports transferring into WC independently, "half and half" with propelling WC and having assist ADLs Comments: reports independent with ADLs        OT Problem List: Decreased strength;Decreased activity tolerance;Impaired balance (sitting and/or standing);Decreased knowledge of use of DME or AE;Decreased knowledge of precautions;Cardiopulmonary status limiting activity      OT Treatment/Interventions: Self-care/ADL training;Therapeutic exercise;DME and/or AE instruction;Therapeutic activities;Balance training;Patient/family education    OT Goals(Current goals can be found in the care plan section) Acute Rehab OT Goals Patient Stated Goal: to feel better OT Goal Formulation: With patient Time For Goal Achievement: 07/02/21 Potential to Achieve Goals: Fair  OT Frequency: Min 2X/week   Barriers to D/C:            Co-evaluation              AM-PAC OT "6 Clicks" Daily Activity     Outcome Measure Help from another person eating meals?: A Little Help from another person taking care of personal grooming?: A Lot Help from another  person toileting, which includes using toliet, bedpan, or urinal?: A Lot Help from another person bathing (including washing, rinsing, drying)?: A Lot Help from another person to put on and taking off regular upper body clothing?: A Lot Help from another person to put on and taking off regular lower body clothing?: A Lot 6 Click Score: 13   End of Session Equipment Utilized During Treatment: Oxygen (4L) Nurse Communication: Mobility status  Activity Tolerance: Patient limited by lethargy Patient left: in bed;with call bell/phone within reach;with bed alarm set  OT Visit Diagnosis: Other abnormalities of gait and mobility (R26.89);Muscle weakness (generalized) (M62.81)                Time: 0258-5277 OT Time Calculation (min): 27 min Charges:  OT General Charges $OT Visit:  1 Visit OT Evaluation $OT Eval Moderate Complexity: 1 Mod OT Treatments $Self Care/Home Management : 8-22 mins  [name] Artist, OT Acute Rehabilitation Services Pager [phone] Office [phone]   Delight Stare 06/18/2021, 10:19 AM

## 2021-06-18 NOTE — Evaluation (Signed)
Physical Therapy Evaluation Patient Details Name: Jamie [name] MRN: [id number] DOB: [birth year] Today's Date: 06/18/2021  History of Present Illness  Pt is a 60 y/o female admitted on 10/26 with shortness of breath due to L sided pleural effusion.  PMH is significant for recurrent pleural effusions, CHF, COPD, type 2 diabetes, HTN, PAD, right BKA, CKD.Pt is a 60 y/o female admitted on 10/26 with shortness of breath due to L sided pleural effusion.  PMH is significant for recurrent pleural effusions, CHF, COPD, type 2 diabetes, HTN, PAD, right BKA, CKD.  Clinical Impression  Pt admitted with/for L pleural effusion.  Pt deconditioned, generally weak and needing min guard in the least to mobilize using bed compensations to get to EOB and transfer OOB to a chair..  Pt currently limited functionally due to the problems listed. ( See problems list.)   Pt will benefit from PT to maximize function and safety in order to get ready for next venue listed below.        Recommendations for follow up therapy are one component of a multi-disciplinary discharge planning process, led by the attending physician.  Recommendations may be updated based on patient status, additional functional criteria and insurance authorization.  Follow Up Recommendations Skilled nursing-short term rehab (<3 hours/day)    Assistance Recommended at Discharge Intermittent Supervision/Assistance  Functional Status Assessment Patient has had a recent decline in their functional status and demonstrates the ability to make significant improvements in function in a reasonable and predictable amount of time. (Get hangar in to assess needs to get to prosthetic level.)  Equipment Recommendations  None recommended by PT;Other (comment) (TBA)    Recommendations for Other Services       Precautions / Restrictions Precautions Precautions: Fall      Mobility  Bed Mobility Overal bed mobility: Needs Assistance             General  bed mobility comments: From minimally raised HOB and minimal use of the rail, pt came up to sitting and scooted to EOB with min guard.    Transfers Overall transfer level: Needs assistance   Transfers: Squat Pivot Transfers       Squat pivot transfers: Min guard;From elevated surface     General transfer comment: guarded and held chair for safety.   To/from a chair from the bed.    Ambulation/Gait                Stairs            Wheelchair Mobility    Modified Rankin (Stroke Patients Only)       Balance Overall balance assessment: Needs assistance   Sitting balance-Leahy Scale: Fair Sitting balance - Comments: able to sit EOB without UE assist and maintain balance against LE MMT.                                     Pertinent Vitals/Pain      Home Living Family/patient expects to be discharged to:: Skilled nursing facility                   Additional Comments: requires 3L at baseline; been staying at [facility] (per chart review since 09/2019)    Prior Function Prior Level of Function : Independent/Modified Independent             Mobility Comments: pt reports transferring into WC independently, "half and half"  with propelling WC and having assist ADLs Comments: reports independent with ADLs     Hand Dominance   Dominant Hand: Right    Extremity/Trunk Assessment   Upper Extremity Assessment Upper Extremity Assessment: Generalized weakness    Lower Extremity Assessment Lower Extremity Assessment: Generalized weakness       Communication   Communication: No difficulties  Cognition Arousal/Alertness: Lethargic Behavior During Therapy: Flat affect (labile) Overall Cognitive Status: Within Functional Limits for tasks assessed                                 General Comments: appears WFL, pt labile and reports "I'm going to die" with MD  present. Pt appears down about being in SNF with poor quality  of life, but reports she can't go home "because she is scared something may happen"        General Comments General comments (skin integrity, edema, etc.): vss on 4L, sats >95%  HR in the 90's    Exercises     Assessment/Plan    PT Assessment Patient needs continued PT services  PT Problem List Decreased strength;Decreased activity tolerance;Decreased balance;Decreased mobility;Decreased knowledge of use of DME;Cardiopulmonary status limiting activity       PT Treatment Interventions DME instruction;Gait training;Functional mobility training;Therapeutic activities;Therapeutic exercise;Balance training;Patient/family education    PT Goals (Current goals can be found in the Care Plan section)  Acute Rehab PT Goals Patient Stated Goal: I want to get my leg. PT Goal Formulation: With patient Time For Goal Achievement: 07/02/21 Potential to Achieve Goals: Fair    Frequency Min 2X/week   Barriers to discharge        Co-evaluation               AM-PAC PT "6 Clicks" Mobility  Outcome Measure Help needed turning from your back to your side while in a flat bed without using bedrails?: A Little Help needed moving from lying on your back to sitting on the side of a flat bed without using bedrails?: A Little Help needed moving to and from a bed to a chair (including a wheelchair)?: A Little Help needed standing up from a chair using your arms (e.g., wheelchair or bedside chair)?: A Little Help needed to walk in hospital room?: Total Help needed climbing 3-5 steps with a railing? : Total 6 Click Score: 14    End of Session Equipment Utilized During Treatment: Oxygen Activity Tolerance: Patient tolerated treatment well;Patient limited by fatigue Patient left: in bed;with call bell/phone within reach Nurse Communication: Mobility status PT Visit Diagnosis: Other abnormalities of gait and mobility (R26.89);Muscle weakness (generalized) (M62.81)    Time: 1025-8527 PT Time  Calculation (min) (ACUTE ONLY): 34 min   Charges:   PT Evaluation $PT Eval Moderate Complexity: 1 Mod PT Treatments $Therapeutic Activity: 8-22 mins        06/18/2021  Ginger Carne., PT Acute Rehabilitation Services [phone]  (pager) [phone]  (office)  Tessie Fass Jiya Kissinger 06/18/2021, 3:58 PM

## 2021-06-18 NOTE — Progress Notes (Addendum)
FPTS Interim Progress Note  S: On assessment this evening patient appears much improved from yesterday evening. She is less anxious and her respirations are unlabored. She agrees with this assessment and reports diminished SoB. Satting well on 3L [place].   O: BP (!) 158/69 (BP Location: Left Arm)   Pulse 71   Temp 97.7 F (36.5 C) (Oral)   Resp 13   Ht 5\' 3"  (1.6 m)   Wt 201 lb 4.5 oz (91.3 kg)   SpO2 98%   BMI 35.66 kg/m   Physical exam: General: ill appearing elderly female, lying in bed Pulm: unlabored respirations   A/P: 60 year old female with dyspnea, large left pleural effusion found to be transudative, likely 2/2 HFpEF. Pulm recommends against thoracentesis and desires heavy diuresis with goal of NN 2L/day. Currently NN 440 mL over the past 24 hrs (no unmeasured occurrences, patient has purewick). Diuresed with Lasix 80 mg IV x3 over the past 24 hrs. Plan is for 80 mg IV BID going forward per day team. Of note patient's home regimen is 100 mg total daily dose of torsemide.  -Consider increasing diuresis given limited response so far and likely tolerance for diuretics, will have to balance this with AKI on CKD 3b.     Corky Sox, MD PGY-1 [place] Intern pager: [phone], text pages welcome

## 2021-06-18 NOTE — Progress Notes (Addendum)
Advanced Heart Failure Rounding Note  PCP-Cardiologist: None   Subjective:    Admitted w/ a/c hypoxic respiratory failure 2/2 a/c diastolic HF and recurrent left pleural effusion.   On 7L [place] (home baseline 3L). O2 sats 98%  Got 80 IV Lasix x 2 yesterday. Unfortunately strict I/Os and wts not documented. She reports decent urinary response and mild improvement in symptoms, though still SOB.   F/u CXR today w/ worsening vascular congestion/edema and persistent large left pleural effusion.   On empiric abx. WBC 12.9>>10.5. AF   1.72>>1.98 c/w baseline. BP elevated 381W systolic.    Objective:   Weight Range: 77.1 kg Body mass index is 30.11 kg/m.   Vital Signs:   Temp:  [98.3 F (36.8 C)-98.6 F (37 C)] 98.6 F (37 C) (10/27 0720) Pulse Rate:  [65-73] 71 (10/27 0342) Resp:  [13-24] 15 (10/27 0720) BP: (149-201)/(47-105) 165/65 (10/27 0720) SpO2:  [94 %-100 %] 100 % (10/27 0720) Weight:  [77.1 kg] 77.1 kg (10/27 0500)    Weight change: Filed Weights   06/18/21 0500  Weight: 77.1 kg    Intake/Output:   Intake/Output Summary (Last 24 hours) at 06/18/2021 0815 Last data filed at 06/18/2021 0342 Gross per 24 hour  Intake 328.75 ml  Output --  Net 328.75 ml      Physical Exam    General:  fatigued appearing WF on Supp O2  HEENT: Normal Neck: Supple. JVP 12 cm . Carotids 2+ bilat; no bruits. No lymphadenopathy or thyromegaly appreciated. Cor: PMI nondisplaced. Regular rate & rhythm. No rubs, gallops or murmurs. Lungs: + rt sided basilar crackles, decreased BS LLL  Abdomen: Soft, nontender, nondistended. No hepatosplenomegaly. No bruits or masses. Good bowel sounds. Extremities: No cyanosis, clubbing, rash, s/p rt BKA, 1+ left sided edema Neuro: Alert & orientedx3, cranial nerves grossly intact. moves all 4 extremities w/o difficulty. Affect pleasant  Telemetry   NSR 80s   EKG    No new EKG to review   Labs    CBC Recent Labs    06/17/21 1238  06/18/21 0548  WBC 12.9* 10.5  NEUTROABS 11.2*  --   HGB 7.4* 8.2*  HCT 24.0* 24.8*  MCV 86.6 83.5  PLT 279 299   Basic Metabolic Panel Recent Labs    06/17/21 1238 06/18/21 0548  NA 136 137  K 4.7 4.2  CL 102 103  CO2 22 28  GLUCOSE 254* 184*  BUN 48* 43*  CREATININE 1.72* 1.98*  CALCIUM 8.7* 8.6*   Liver Function Tests Recent Labs    06/18/21 0548  AST 15  ALT 13  ALKPHOS 72  BILITOT 0.7  PROT 6.0*  ALBUMIN 2.6*   No results for input(s): LIPASE, AMYLASE in the last 72 hours. Cardiac Enzymes No results for input(s): CKTOTAL, CKMB, CKMBINDEX, TROPONINI in the last 72 hours.  BNP: BNP (last 3 results) Recent Labs    03/17/21 1145 06/17/21 1850 06/18/21 0548  BNP 261.8* 571.9* 650.6*    ProBNP (last 3 results) No results for input(s): PROBNP in the last 8760 hours.   D-Dimer No results for input(s): DDIMER in the last 72 hours. Hemoglobin A1C Recent Labs    06/17/21 1850  HGBA1C 6.6*   Fasting Lipid Panel No results for input(s): CHOL, HDL, LDLCALC, TRIG, CHOLHDL, LDLDIRECT in the last 72 hours. Thyroid Function Tests No results for input(s): TSH, T4TOTAL, T3FREE, THYROIDAB in the last 72 hours.  Invalid input(s): FREET3  Other results:   Imaging  DG Chest Portable 2 Views  Result Date: 06/17/2021 CLINICAL DATA:  Shortness of breath hypoxia EXAM: CHEST  2 VIEW PORTABLE COMPARISON:  06/17/2021, CT 04/08/2021, chest x-ray 03/17/2021 FINDINGS: Moderate possibly loculated left pleural effusion. Cardiomegaly with vascular congestion and mild pulmonary edema. Atelectasis or pneumonia at the left base. IMPRESSION: No significant change in moderate to large left pleural effusion with possible loculation. Cardiomegaly with colour congestion and edema. Atelectasis or pneumonia at the left base. Electronically Signed   By: Donavan Foil M.D.   On: 06/17/2021 18:44   DG CHEST PORT 1 VIEW  Result Date: 06/18/2021 CLINICAL DATA:  Shortness of  breath. EXAM: PORTABLE CHEST 1 VIEW COMPARISON:  Chest radiograph dated 06/17/2021. FINDINGS: Cardiomegaly with vascular congestion and edema progressed since the prior radiograph. Left pleural effusion similar to prior radiograph. No pneumothorax. No acute osseous pathology. IMPRESSION: Cardiomegaly with findings of congestive heart failure, progressed since the prior radiograph. Left pleural effusion. Electronically Signed   By: Anner Crete M.D.   On: 06/18/2021 01:23   DG Chest Port 1 View  Result Date: 06/17/2021 CLINICAL DATA:  Shortness of breath EXAM: PORTABLE CHEST 1 VIEW COMPARISON:  Chest x-ray 04/08/2021 FINDINGS: Heart size appears normal. Mediastinum is stable. Calcified plaques in the aortic arch. Moderate to large left pleural effusion with associated atelectasis/infiltrate, increased since previous study. No pneumothorax. IMPRESSION: Moderate to large left pleural effusion with associated atelectasis/infiltrate, persistent and increased since previous study. Electronically Signed   By: Ofilia Neas   On: 06/17/2021 13:21     Medications:     Scheduled Medications:  sodium chloride   Intravenous Once   amLODipine  10 mg Oral Daily   aspirin EC  81 mg Oral Daily   atorvastatin  80 mg Oral QHS   azithromycin  500 mg Oral Daily   busPIRone  10 mg Oral BID   clonazePAM  0.5 mg Oral BID   cloNIDine  0.3 mg Oral BID   clopidogrel  75 mg Oral Daily   furosemide  80 mg Intravenous BID   gabapentin  100 mg Oral TID   hydrALAZINE  25 mg Oral TID   insulin aspart  0-15 Units Subcutaneous TID WC   insulin glargine-yfgn  10 Units Subcutaneous Daily   isosorbide mononitrate  120 mg Oral Daily   labetalol  300 mg Oral Q12H   pantoprazole  40 mg Oral Daily   senna  2 tablet Oral QHS   [START ON 06/19/2021] venlafaxine XR  75 mg Oral Q breakfast    Infusions:  cefTRIAXone (ROCEPHIN)  IV 2 g (06/18/21 0401)    PRN Medications: polyethylene glycol, polyvinyl  alcohol   Assessment/Plan   1. Acute on chronic hypoxemic respiratory failure: 09/2019 COVID-19 viral pneumonia. She has been on home oxygen 3L [place] and said to have COPD but has never smoked.   -  Suspect main issue here is combination of volume overload and recurrent L effusion  - C/w IV Lasix for diuresis  - If effusion does not improve with diuresis will need IR thoracentesis +/- Pleruex 2. Acute/chronic diastolic CHF: Multiple admissions with hypertensive urgency/diastolic CHF. Echo in 2/21 with EF 55-60%, normal RV, IVC suggesting RA pressure 8 mmHg.  [place] 3/21 showed optimized filling pressures with normal cardiac output.  With multiple diastolic CHF admissions, we placed a Cardiomems device 4/22 but later deactivated 03/16/21. Says she has reported broken equipment to company. Echo 7/22 EF 65%. Now admitted w/ a/c CHF. BNP 650. CXR  w/ pulmonary edema + large left pleural effusion. - C/w 80 mg IV Lasix bid. D/w strict I/Os w/ RN  - Consider adding Entresto pending renal fx  - Given recurrent pleural effusions, consider w/u for amyloid 3. Pleural effusions: 09/2019 Had thoracenteses at [facility] and again bilaterally at [facility] in 3/21. Transudates with negative cytology. Suspect due to CHF.  - Has recurrent L pleural effusion - C/w lasix 80 IV bid - If effusion does not improve with diuresis will need IR thoracentesis +/- Pleruex 4. HTN: BP elevated today Renal artery dopplers in 3/21 with nonobstructive disease.  - Continue amlodipine 10 mg daily.  - Continue hydralazine 25 mg tid.  - Continue labetalol 300 mg bid.  - Continue clonidine 0.3 bid.   - Consider adding Entresto 5. CKD: Stage 3.  Suspect diabetic nephropathy.  - Baseline SCr ~2.0. 1.98 today  - monitor w/ diuresis   6. Carotid stenosis: recent doppler 8/22: 1-39% RICA stenosis and 35-57% LICA stenosis  - follow yearly  7. Odynophagia: Also with GERD symptoms.  EGD (6/22) with no obvious esophageal mechanical obstruction, likely  benign fundic gland polyps, cannot exclude esophageal dysmotility and gastroparesis. 8. DM: Per [place] 9. CAD: In 3/21, she had atherectomy/PCI to prox/mid LAD and PCI mid LAD. For now, no intervention on moderate LCx disease. stable w/o CP  - Continue Imdur 90 mg daily.  - Continue ASA 81 + Plavix 75.   - Continue atorvastatin 80 mg daily. Lipids checked (4/22). - Eventually, would transition from Plavix to rivaroxaban 2.5 mg bid given CAD and PAD.  10: PAD: S/p R BKA.  Mildly decreased ABI on left in 8/21.   - Continue statin, ASA, Plavix.  - Repeat peripheral arterial dopplers (5/22) showed mild left lower arterial disease.   Length of Stay: 0  Lyda Jester, PA-C  06/18/2021, 8:15 AM  Advanced Heart Failure Team Pager [phone] (M-F; 7a - 5p)  Please contact [facility] for night-coverage after hours (5p -7a ) and weekends on [email or web address]  Patient seen with PA, agree with the above note.   She is on IV Lasix but I/Os not recorded so unable to tell how much she diuresed.  She has recurrent moderate to large left pleural effusion. She is on 3L home oxygen at baseline, now back on her baseline oxygen.   General: NAD Neck: JVP 12 cm, no thyromegaly or thyroid nodule.  Lungs: Decreased left base.  CV: Nondisplaced PMI.  Heart regular S1/S2, no S3/S4, no murmur.  Trace ankle edema.  No carotid bruit.  Normal pedal pulses.  Abdomen: Soft, nontender, no hepatosplenomegaly, no distention.  Skin: Intact without lesions or rashes.  Neurologic: Alert and oriented x 3.  Psych: Normal affect. Extremities: No clubbing or cyanosis. Right BKA HEENT: Normal.   Patient presents with acute on chronic diastolic CHF.  Echo in 7/22 with EF 60-65%, normal RV.  Unable to use Cardiomems as the pillow is broken. Creatinine is at her baseline, 1.98.  - Lasix 80 mg IV bid and need to follow I/Os.  Will decide on need for metolazone based on today's diuresis.  - Consider SGLT2 inhibitor with CHF and  CKD, but risk of GU infection may be significant.  - Will see if we can get her a new Cardiomems pillow for home.   BP was initially elevated, now improved.  She is on her home BP meds.   Recurrent moderate-large left effusion, has been transudative in the past.  Pulmonary recommends against repeat  thoracentesis.   No chest pain, continue Plavix/ASA/statin.  Doubt ACS.   Loralie Champagne 06/18/2021 1:58 PM

## 2021-06-18 NOTE — Progress Notes (Addendum)
Family Medicine Teaching Service Daily Progress Note Intern Pager: [phone]  Patient name: Jamie [name] Medical record number: [id number] Date of birth: 09-01-1960 Age: 60 y.o. Gender: female  Primary Care Provider: Patient, No Pcp Per (Inactive) Consultants: Cardiology Code Status: Full  Pt Overview and Major Events to Date:  10/26-admitted  Assessment and Plan:  Ellizabeth [name] is a 60 year old female who presented with worsening shortness of breath, fatigue and pressure-like chest pain.  PMH is significant for recurrent pleural effusions, CHF, T2DM, hypertension, COPD, PAD, CKD, GERD, and right BKA  Acute on chronic hypoxic respiratory failure Multiple hospitalization for chronic respiratory failure.  With the last hospitalization requiring thoracentesis for bilateral pleural effusion.  Came in for dyspnea, chest pain, and fatigue.  Patient was hypoxemic with sats in the 80s on baseline 3 L nasal cannula.  Saturation improved on 15L. CXR showed moderate to large left pleural effusion with atelectasis.  Was afebrile on admission with normal lactic acid and slightly elevated WBC of 12.9.  Other differential but less likely include pneumonia (afebrile, normal lactic acid), PE (intermittent pressure like chest pain, no tachycardia).   Overnight patient destat to 55 with associated confusion and anxious. BNP ordered which was elevated at 650.6 and Procalcitonin was normal 0.28.On exam patient has diffuse crackles and LLE +2 edema. Dyspnea attributed to possible constellation of medical conditions that include pleural effusion, CHF, anxiety, and anemia.  Pulmonology consulted who recommends having palliative team and recommend conservative management rather than thoracocentesis at this time. -Cardiology following, appreciate recs -Palliative consulted -O2 saturation goal >92% -On 4L [place] , (3L at baseline) -IV Lasix 80 mg twice daily -PT/OT eval and treat -Follow-up blood culture. -Strict I's and  O's -Daily weights-Discontinued CTX and Azithromycin due to low concerns for infection.    Diastolic CHF  HFpEF Last Echo 7/22 shows EF of 60 to 65%.  Home medication include Imdur 90 mg daily, torsemide 60 mg in the a.m., 40 mg p.m. and potassium 10 mEq daily. -Cardiology following, appreciate recs  -Continue home medication   CAD  PAD Home medication include aspirin 81 mg daily, atorvastatin 80 mg at bedtime, Plavix 75 mg daily, Imdur 90 mg daily and Nitrostat 0.4 mg as needed -Continue home meds   Normocytic anemia Came in with hemoglobin of 7.4, Baseline hemoglobin 8.5.  Hgb after 1 unit RBC was 8.2. - Morning Lab:  CBC  COPD On Advair twice daily  T2DM A1c 6.6.  CBG in the last 24 hours ranged from 208 to 168.  Most recent blood glucose 168. -Continue Semglee 10 units daily -Moderate SSI -Monitor CBG with meals and bedtime  Hypertension BP in last 24 hours ranged from 149-201/47-77.  Most recent 165/65 at home medication include amlodipine 10 mg daily, hydralazine 25 mg 3 times daily, labetalol 300 mg twice daily and clonidine 0.3 mg twice daily. -Continue routine vitals -Continue home medication  AKI on CKD stage IIIb In 2020 creatinine was 0.6 - 0.8, 2022 Cr. 1.7-2 2.06. Cr was 1.7-2 2.06.  Creatinine is slightly elevated today at 1.98 (1.72).  Patient's slightly elevated Cr is most likely in response to lasix treatment. Will monitor creatinine closely while on lasix. -Avoid nephrotoxic agents -Continue daily lab: BMP  Anxiety On home medication of Klonopin 0.5 twice daily, buspirone 5 mg, Effexor 37.5 mg daily -Continue home medication - Switch to 0.25mg  Klonopin BID  FEN/GI: Carb modified diet PPx: SCDs Dispo:Home pending clinical improvement .   Subjective:  Patient reports feeling tired and  and shortness of breath.  She denies any pain but endorses feeling anxious.  Objective: Temp:  [98.3 F (36.8 C)-98.6 F (37 C)] 98.6 F (37 C) (10/27 0720) Pulse  Rate:  [65-73] 71 (10/27 0342) Resp:  [13-24] 15 (10/27 0720) BP: (149-201)/(47-105) 165/65 (10/27 0720) SpO2:  [94 %-100 %] 100 % (10/27 0720) Weight:  [77.1 kg] 77.1 kg (10/27 0500) Physical Exam: General: Awake, severely fatigued, NAD Cardiovascular: RRR, no murmurs, normal S1/S2 Respiratory: Diffuse crackles, on 4L nasal cannula with good WOB. Abdomen: Soft, no distention, no tenderness Extremities: RL BKA, +2 LLE edema  Laboratory: Recent Labs  Lab 06/17/21 1238 06/18/21 0548  WBC 12.9* 10.5  HGB 7.4* 8.2*  HCT 24.0* 24.8*  PLT 279 262   Recent Labs  Lab 06/17/21 1238 06/18/21 0548  NA 136 137  K 4.7 4.2  CL 102 103  CO2 22 28  BUN 48* 43*  CREATININE 1.72* 1.98*  CALCIUM 8.7* 8.6*  PROT  --  6.0*  BILITOT  --  0.7  ALKPHOS  --  72  ALT  --  13  AST  --  15  GLUCOSE 254* 184*    Imaging/Diagnostic Tests: DG Chest Portable 2 Views  Result Date: 06/17/2021 CLINICAL DATA:  Shortness of breath hypoxia EXAM: CHEST  2 VIEW PORTABLE COMPARISON:  06/17/2021, CT 04/08/2021, chest x-ray 03/17/2021 FINDINGS: Moderate possibly loculated left pleural effusion. Cardiomegaly with vascular congestion and mild pulmonary edema. Atelectasis or pneumonia at the left base. IMPRESSION: No significant change in moderate to large left pleural effusion with possible loculation. Cardiomegaly with colour congestion and edema. Atelectasis or pneumonia at the left base. Electronically Signed   By: Donavan Foil M.D.   On: 06/17/2021 18:44   DG CHEST PORT 1 VIEW  Result Date: 06/18/2021 CLINICAL DATA:  Shortness of breath. EXAM: PORTABLE CHEST 1 VIEW COMPARISON:  Chest radiograph dated 06/17/2021. FINDINGS: Cardiomegaly with vascular congestion and edema progressed since the prior radiograph. Left pleural effusion similar to prior radiograph. No pneumothorax. No acute osseous pathology. IMPRESSION: Cardiomegaly with findings of congestive heart failure, progressed since the prior radiograph.  Left pleural effusion. Electronically Signed   By: Anner Crete M.D.   On: 06/18/2021 01:23     Alen Bleacher, MD 06/18/2021, 8:24 AM PGY-1, [place] Intern pager: [phone], text pages welcome

## 2021-06-18 NOTE — Progress Notes (Signed)
Pt called to nursing station because she felt as if she couldn't not breath. Nurse arrived at pt room pt was in bed sitting up asking for help because she couldn't not breath. Pt was A&O x4 talking and V/S BP - 176/54, P- 73, RR- 20 and O2 100 on 3L [place]. Pt bargain getting anxious she began to desat very rapidly in the high 80"s. Pt was placed on  NRB @ 12L O2 100%.  Charge Nurse informed, Family Med notified by pager [phone]  and rapid called to bedside.

## 2021-06-18 NOTE — Progress Notes (Signed)
Pt is On NRB @ 7 L

## 2021-06-18 NOTE — Progress Notes (Addendum)
Called at approximately 0030 by nursing to assess patient with recent desat and associated confusion. Rapid response called. On further discussion, nurse reports patient desatted to 89% while on her baseline 3L [place]. She was then placed on NRB at 15L. She reports that the patient appeared more confused than previously.  On my assessment, patient is found satting at 100% on NRB at 15L. She is in distress, saying "I'm going to die" and appears anxious. Her respirations are labored. On physical exam, her lung sounds are similar to my previous assessment with reduced breath sounds, L>R; faint crackles are heard this time on the right side. While attending to the patient from 0030 to 0130, she is demanding and insistent that she is going to die. CXR and ABG obtained. We attempted to wean her to [place], but she desatted to 87%, however, there was not a good waveform at that time. She was placed back on NRB at 15L. Overall, her mental status was difficult to assess because she refused to answer questions she felt were unrelated to her breathing.   Assessment and plan Patient is a 60 year old female with HFpEF and large L pleural effusion. Of note, patient has anemia (hgb 7.4) and had a transfusion started at 2300. She is s/p Lasix 80 mg IV. CXR obtained with read showing worsening vascular congestion and edema. ABG 7.4/45/52/26, indicating no hypercapnia, normal acid-base status, and potential hypoxia (unsure how to reconcile this with good SpO2 sats). Concern for TACO given that she is currently receiving a transfusion. Given another 80 mg Lasix IV. BNP ordered. Also concern for CAP (not HCAP given patient just admitted). MRSA swab positive, but will only start CTX and azithro for now and defer addition of Vanc to day team due to patient's poor renal function. Also considered PE, however, patient has no CP and is not tachy. Again, poor renal function limits the advisability of obtaining a CTA.  -Lasix 80 mg IV  x1 -BNP -Procal -CTX and azithro for 5 days -Consider vanc or other anti-MRSA -Re-assess in 1 hr

## 2021-06-18 NOTE — Progress Notes (Signed)
Pt previously on non-rebreather mask at 7 L. Pt is currently on 4 L sustaining oxygen saturation at 99%.

## 2021-06-18 NOTE — Progress Notes (Signed)
   I called the [place], [place] regarding difficulty with pillow used for readings.   [place] will contact Mrs Marlar to help with malfunction and also with replacement if needed.    Liv Rallis NP-C  3:51 PM

## 2021-06-18 NOTE — Consult Note (Signed)
NAME:  Jamie [name], MRN:  [id number], DOB:  06/24/1961, LOS: 0 ADMISSION DATE:  06/17/2021, CONSULTATION DATE:  06/18/2021 REFERRING MD:  Lenoria Chime, MD, CHIEF COMPLAINT:  pleural effusion   History of Present Illness:  Jamie [name] is a 60 y.o. woman with history of coronary artery disease, diastolic heart failure, chronic and recurrent pleural effusions for decompensated heart failure exacerbation.  She presented with 2 to 3 days of worsening shortness of breath, cough, increased oxygen requirements.  She has been given IV Lasix for diuresis, heart failure center, pulmonary is consulted to see if thoracentesis would be appropriate for pleural effusion.  Overnight and on presentation she had a high respiratory needs, was tachypneic, short of breath, increasing oxygen requirement.  This morning she is minimal but better.  She has been given 80 mg IV Lasix for diuresis and is responding appropriately.  However she is very down today.  She notes that she has a miserable quality of life in a nursing home and feels like she is suffering.  She says she is going to die.  She is asking if there is any quick fix for her problems.  Pertinent  Medical History  Chronic respiratory failure on 3 L Reflux Type 2 diabetes Right BKA Depression Anxiety  Significant Hospital Events: Including procedures, antibiotic start and stop dates in addition to other pertinent events     Interim History / Subjective:   Objective   Blood pressure (!) 165/65, pulse 71, temperature 98.6 F (37 C), temperature source Oral, resp. rate 15, height _0  (1.6 m), weight 77.1 kg, SpO2 100 %.        Intake/Output Summary (Last 24 hours) at 06/18/2021 0953 Last data filed at 06/18/2021 0342 Gross per 24 hour  Intake 328.75 ml  Output --  Net 328.75 ml   Filed Weights   06/18/21 0500  Weight: 77.1 kg    Examination: General: Thin, elderly, chronically ill-appearing HENT: Elevated JVP, mucous membranes moist,  nasal cannula Lungs: Diminished bilateral bases, no wheezes or crackles, no increased work of breathing Cardiovascular: Regular rate and rhythm, soft systolic murmur, no gallops or rubs Abdomen: Soft, nontender, nondistended, no fluid wave Extremities: Left lower extremity with minimal pitting edema, status post right BKA Neuro: Normal speech, no focal asymmetry GU: External catheter in place  Resolved Hospital Problem list     Assessment & Plan:   Acute on chronic hypoxemic respiratory failure Acute on chronic heart failure preserved ejection fraction, secondary to ischemic cardiomyopathy Recurrent bilateral pleural effusions, left lower than right  Jamie [name] has worsening bilateral pleural effusions left greater than right.  She has had recurrent thoracentesis especially on that left side.  I actually performed her last thoracentesis in March.  At that time she had significant pain and I was concerned for eventual lung entrapment.  She is expressing a lot of distress about her current quality of life and feels like she is suffering.  I do think is appropriate to get palliative care involved to help with goals of care decision making.  She is wanting to spend time with her grandchildren and feels like she cannot see them due to some family dynamics.  Regarding her pleural effusion I think it would be best managed conservatively with IV diuresis.  She should be net negative at least 2 L every 24 hours.  It seems like her blood pressure is giving [place] some room to play if we need to increase.  I would not recommend  thoracentesis at this time given the mild pain she had last time.  I definitely do not recommend a Pleurx catheter placement in someone with recurrent transudative effusions from heart failure.  She would be at risk for developing a protein-losing enteropathy and this is not currently a recommended practice.  I will consult palliative care, and have the chaplain comes in with her.  She is  extremely tearful.  Pulmonary will follow up on her respiratory failure.  Please call with questions.  Lenice Llamas, MD Pulmonary and [place]     Labs   CBC: Recent Labs  Lab 06/17/21 1238 06/18/21 0548  WBC 12.9* 10.5  NEUTROABS 11.2*  --   HGB 7.4* 8.2*  HCT 24.0* 24.8*  MCV 86.6 83.5  PLT 279 563    Basic Metabolic Panel: Recent Labs  Lab 06/17/21 1238 06/18/21 0548  NA 136 137  K 4.7 4.2  CL 102 103  CO2 22 28  GLUCOSE 254* 184*  BUN 48* 43*  CREATININE 1.72* 1.98*  CALCIUM 8.7* 8.6*   GFR: Estimated Creatinine Clearance: 29.7 mL/min (A) (by C-G formula based on SCr of 1.98 mg/dL (H)). Recent Labs  Lab 06/17/21 1238 06/18/21 0548  PROCALCITON  --  0.28  WBC 12.9* 10.5  LATICACIDVEN 0.9  --     Liver Function Tests: Recent Labs  Lab 06/18/21 0548  AST 15  ALT 13  ALKPHOS 72  BILITOT 0.7  PROT 6.0*  ALBUMIN 2.6*   No results for input(s): LIPASE, AMYLASE in the last 168 hours. No results for input(s): AMMONIA in the last 168 hours.  ABG    Component Value Date/Time   PHART 7.379 06/18/2021 0107   PCO2ART 44.7 06/18/2021 0107   PO2ART 51.5 (L) 06/18/2021 0107   HCO3 25.8 06/18/2021 0107   TCO2 27 10/31/2019 1448   O2SAT 82.8 06/18/2021 0107     Coagulation Profile: No results for input(s): INR, PROTIME in the last 168 hours.  Cardiac Enzymes: No results for input(s): CKTOTAL, CKMB, CKMBINDEX, TROPONINI in the last 168 hours.  HbA1C: Hgb A1c MFr Bld  Date/Time Value Ref Range Status  06/17/2021 06:50 PM 6.6 (H) 4.8 - 5.6 % Final    Comment:    (NOTE) Pre diabetes:          5.7%-6.4%  Diabetes:              >6.4%  Glycemic control for   <7.0% adults with diabetes   10/23/2019 05:03 AM 6.9 (H) 4.8 - 5.6 % Final    Comment:    (NOTE) Pre diabetes:          5.7%-6.4% Diabetes:              >6.4% Glycemic control for   <7.0% adults with diabetes     CBG: Recent Labs  Lab  06/17/21 1711 06/18/21 0037 06/18/21 0725  GLUCAP 208* 217* 168*    Review of Systems:   +shortness of breath - chest pain + le edema Otherwise 10 point ros reviewed and negative  Past Medical History:  She,  has a past medical history of Amputation below knee (HCC) RT, CHF (congestive heart failure) ([place]), Chronic diastolic HF (heart failure) ([place]) (10/21/2019), COPD (chronic obstructive pulmonary disease) ([place]), Diabetes type 2, uncontrolled ([place]), Gastritis and duodenitis, HTN (hypertension) (10/21/2019), PAD (peripheral artery disease) ([place]) (10/21/2019), PAD (peripheral artery disease) ([place]), PNA (pneumonia), and TIA (transient ischemic attack).   Surgical History:   Past Surgical  History:  Procedure Laterality Date   BELOW KNEE LEG AMPUTATION Right    CORONARY ATHERECTOMY N/A 11/02/2019   Procedure: CORONARY ATHERECTOMY;  Surgeon: [place], Peter M, MD;  Location: [facility];  Service: Cardiovascular;  Laterality: N/A;   CORONARY STENT INTERVENTION N/A 11/02/2019   Procedure: CORONARY STENT INTERVENTION;  Surgeon: [place], Peter M, MD;  Location: [facility];  Service: Cardiovascular;  Laterality: N/A;   PRESSURE SENSOR/CARDIOMEMS N/A 10/31/2019   Procedure: PRESSURE SENSOR/CARDIOMEMS;  Surgeon: Larey Dresser, MD;  Location: [facility];  Service: Cardiovascular;  Laterality: N/A;   RIGHT/LEFT HEART CATH AND CORONARY ANGIOGRAPHY N/A 10/31/2019   Procedure: RIGHT/LEFT HEART CATH AND CORONARY ANGIOGRAPHY;  Surgeon: Larey Dresser, MD;  Location: [facility];  Service: Cardiovascular;  Laterality: N/A;     Social History:   reports that she has never smoked. She has never used smokeless tobacco. She reports that she does not drink alcohol and does not use drugs.   Family History:  Her family history includes Diabetes in her brother; Heart attack in her paternal grandmother; Hypertension in her brother and maternal grandmother.   Allergies Allergies   Allergen Reactions   Codeine Other (See Comments)    headaches   Meperidine And Related Other (See Comments)    headaches   Tetracyclines & Related Other (See Comments)    Caused yeast infections   Tuberculin Tests Other (See Comments)     Home Medications  Prior to Admission medications   Medication Sig Start Date End Date Taking? Authorizing Provider  acetaminophen (TYLENOL) 325 MG tablet Take 650 mg by mouth every 6 (six) hours as needed for headache (pain).   Yes [provider]  albuterol (PROVENTIL) (2.5 MG/3ML) 0.083% nebulizer solution Take 2.5 mg by nebulization every 4 (four) hours as needed for wheezing or shortness of breath.   Yes [provider]  albuterol (VENTOLIN HFA) 108 (90 Base) MCG/ACT inhaler Inhale 2 puffs into the lungs every 6 (six) hours as needed for wheezing or shortness of breath.   Yes [provider]  amLODipine (NORVASC) 10 MG tablet Take 1 tablet (10 mg total) by mouth daily. 11/07/19  Yes Dana Allan I, MD  aspirin EC 81 MG EC tablet Take 1 tablet (81 mg total) by mouth daily. 11/07/19  Yes Dana Allan I, MD  atorvastatin (LIPITOR) 40 MG tablet Take 40 mg by mouth at bedtime.   Yes [provider]  busPIRone (BUSPAR) 10 MG tablet Take 10 mg by mouth 2 (two) times daily.   Yes [provider]  clonazePAM (KLONOPIN) 0.5 MG tablet Take 0.5 mg by mouth 2 (two) times daily.   Yes [provider]  cloNIDine (CATAPRES) 0.3 MG tablet Take 1 tablet (0.3 mg total) by mouth 2 (two) times daily. 11/06/19  Yes Dana Allan I, MD  clopidogrel (PLAVIX) 75 MG tablet Take 75 mg by mouth daily.   Yes [provider]  dicyclomine (BENTYL) 10 MG capsule Take 10 mg by mouth every 6 (six) hours as needed for spasms.   Yes [provider]  ferrous sulfate 325 (65 FE) MG tablet Take 325 mg by mouth 2 (two) times daily.   Yes [provider]  fluticasone-salmeterol (ADVAIR) 250-50  MCG/ACT AEPB Inhale 1 puff into the lungs in the morning and at bedtime.   Yes [provider]  gabapentin (NEURONTIN) 400 MG capsule Take 400 mg by mouth 3 (three) times daily.  Yes [provider]  Glucagon, rDNA, (GLUCAGON EMERGENCY) 1 MG KIT Inject 1 mg as directed See admin instructions. Inject 1 mg every 15 minutes as needed for blood sugar below 60   Yes [provider]  hydrALAZINE (APRESOLINE) 25 MG tablet Take 1 tablet (25 mg total) by mouth 3 (three) times daily. 12/15/20 12/15/21 Yes Larey Dresser, MD  insulin aspart (NOVOLOG) 100 UNIT/ML injection Inject 0-9 Units into the skin 3 (three) times daily with meals. Patient taking differently: Inject 2-10 Units into the skin in the morning, at noon, in the evening, and at bedtime. Sliding scale :  200-249 = 2 units 250-299=4 units 300-349=6 units 350-399=8 units 400-449=10 units 11/06/19  Yes Dana Allan I, MD  insulin detemir (LEVEMIR) 100 UNIT/ML injection Inject 28 Units into the skin 2 (two) times daily.   Yes [provider]  isosorbide mononitrate (IMDUR) 120 MG 24 hr tablet Take 120 mg by mouth daily.   Yes [provider]  labetalol (NORMODYNE) 300 MG tablet Take 300 mg by mouth every 12 (twelve) hours.   Yes [provider]  Lactobacillus 0.2-0.2 MG TABS Take 1 tablet by mouth daily.   Yes [provider]  loperamide (IMODIUM A-D) 2 MG tablet Take 2 mg by mouth every 6 (six) hours as needed for diarrhea or loose stools.   Yes [provider]  magnesium oxide (MAG-OX) 400 MG tablet Take 400 mg by mouth 2 (two) times daily.   Yes [provider]  meclizine (ANTIVERT) 25 MG tablet Take 25 mg by mouth every 8 (eight) hours as needed for dizziness.   Yes [provider]  metoCLOPramide (REGLAN) 5 MG tablet Take 2.5 mg by mouth 3 (three) times daily before meals.   Yes [provider]  Multiple Vitamin (MULTIVITAMIN WITH MINERALS)  TABS tablet Take 1 tablet by mouth daily. 11/07/19  Yes Dana Allan I, MD  nitroGLYCERIN (NITROSTAT) 0.4 MG SL tablet Place 0.4 mg under the tongue every 5 (five) minutes as needed for chest pain.   Yes [provider]  ondansetron (ZOFRAN) 4 MG tablet Take 4 mg by mouth every 6 (six) hours as needed for nausea or vomiting.   Yes [provider]  OXYGEN Inhale 3 L into the lungs continuous.   Yes [provider]  pantoprazole (PROTONIX) 40 MG tablet Take 40 mg by mouth daily.   Yes [provider]  polyethylene glycol (MIRALAX / GLYCOLAX) 17 g packet Take 17 g by mouth daily.   Yes [provider]  polyvinyl alcohol (LIQUIFILM TEARS) 1.4 % ophthalmic solution Place 1 drop into both eyes every 8 (eight) hours as needed for dry eyes.   Yes [provider]  potassium chloride (KLOR-CON) 10 MEQ tablet Take 20 mEq by mouth daily.   Yes [provider]  Torsemide 40 MG TABS Take 40 mg by mouth daily.   Yes [provider]  venlafaxine XR (EFFEXOR-XR) 75 MG 24 hr capsule Take 75 mg by mouth daily with breakfast.   Yes [provider]  Vitamin D, Ergocalciferol, (DRISDOL) 1.25 MG (50000 UNIT) CAPS capsule Take 50,000 Units by mouth See admin instructions. Take 50000 units every Monday and thursday   Yes [provider]  clonazePAM (KLONOPIN) 0.25 MG disintegrating tablet Take 1 tablet (0.25 mg total) by mouth 2 (two) times daily. Patient not taking: No sig reported 11/06/19   Bonnell Public, MD  meclizine (ANTIVERT) 50 MG tablet Take 1 tablet (  50 mg total) by mouth 3 (three) times daily as needed. Patient not taking: No sig reported 05/21/20   Deno Etienne, DO  torsemide (DEMADEX) 20 MG tablet Take 3 tablets (60 mg total) by mouth in the morning AND 2 tablets (40 mg total) every evening. Patient not taking: Reported on 06/17/2021 05/16/20   Larey Dresser, MD

## 2021-06-18 NOTE — Significant Event (Signed)
Rapid Response Event Note   Reason for Call :  Confusion, resp distress  Initial Focused Assessment:  Pt lying in bed with eyes open, tachypneic/labored/anxious. Pt is alert but confused, yelling "I can't breathe. I'm gonna die." Lungs diminished t/o L >R . Skin cool to touch.  HR-72, BP-201/77, RR-24, SpO2-100% on NRB   Interventions:  PCXR-Cardiomegaly with findings of congestive heart failure, progressed since the prior radiograph. Left pleural effusion. ABG-7.37/44.7/51.5/25.8 Lasix 80mg  IV x 1 Plan of Care:  Give time for lasix to work. Continue to monitor pt closely. Call RRT if further assistance needed.  Event Summary:   MD Notified: MDs Alvie Heidelberg and [place] notified and came to bedside Call [place], Jamie Taddei Anderson, RN

## 2021-06-18 NOTE — Progress Notes (Signed)
In to check on patient. Found sleeping, 99% SpO2.    Corky Sox, MD PGY-1 [place] Intern pager: [phone], text pages welcome

## 2021-06-18 NOTE — Progress Notes (Signed)
FPTS Interim Progress Note  S: on assessment this evening patient is found lying in bed with labored respirations. She complains of shortness of breath. She is satting well on 3L O2. She asks questions about her care plan which are answered.   O: BP (!) 149/47   Pulse 72   Temp 98.3 F (36.8 C) (Oral)   Resp 19   Ht 5\' 3"  (1.6 m)   SpO2 99%   BMI 30.22 kg/m   General: ill-appearing female lying in bed, obvious discomfort Resp: labored respirations, however, able to speak in full sentences, auscultation reveals diminished breath sounds bilaterally.  CV: RRR, NMRG ABD: NTTP  A/P: 60 year old female presented with AHRF, likely 2/2 large pleural effusion and HF exacerbation, currently diuresing with Lasix. Possible thoracentesis tomorrow.  -No changes -Remainder of plan per day team  Corky Sox, MD PGY-1 [place] Intern pager: [phone], text pages welcome

## 2021-06-18 NOTE — Progress Notes (Signed)
   06/18/21 0950  Clinical Encounter Type  Visited With Patient  Visit Type Initial  Referral From Nurse  Consult/Referral To Chaplain   Chaplain responded to the consult request for prayer. Chaplain prayed at the bedside. This note was prepared by Jeanine Luz, M.Div..  For questions please contact by phone [phone].

## 2021-06-19 DIAGNOSIS — I5031 Acute diastolic (congestive) heart failure: Secondary | ICD-10-CM | POA: Diagnosis not present

## 2021-06-19 DIAGNOSIS — I2510 Atherosclerotic heart disease of native coronary artery without angina pectoris: Secondary | ICD-10-CM | POA: Diagnosis not present

## 2021-06-19 DIAGNOSIS — J90 Pleural effusion, not elsewhere classified: Secondary | ICD-10-CM | POA: Diagnosis not present

## 2021-06-19 DIAGNOSIS — R0689 Other abnormalities of breathing: Secondary | ICD-10-CM | POA: Diagnosis not present

## 2021-06-19 DIAGNOSIS — R0602 Shortness of breath: Secondary | ICD-10-CM

## 2021-06-19 LAB — BASIC METABOLIC PANEL
Anion gap: 7 (ref 5–15)
BUN: 42 mg/dL — ABNORMAL HIGH (ref 6–20)
CO2: 27 mmol/L (ref 22–32)
Calcium: 8.5 mg/dL — ABNORMAL LOW (ref 8.9–10.3)
Chloride: 102 mmol/L (ref 98–111)
Creatinine, Ser: 1.82 mg/dL — ABNORMAL HIGH (ref 0.44–1.00)
GFR, Estimated: 31 mL/min — ABNORMAL LOW (ref >60)
Glucose, Bld: 215 mg/dL — ABNORMAL HIGH (ref 70–99)
Potassium: 3.7 mmol/L (ref 3.5–5.1)
Sodium: 136 mmol/L (ref 135–145)

## 2021-06-19 LAB — CBC
HCT: 24.8 % — ABNORMAL LOW (ref 36.0–46.0)
Hemoglobin: 8 g/dL — ABNORMAL LOW (ref 12.0–15.0)
MCH: 27.1 pg (ref 26.0–34.0)
MCHC: 32.3 g/dL (ref 30.0–36.0)
MCV: 84.1 fL (ref 80.0–100.0)
Platelets: 229 10*3/uL (ref 150–400)
RBC: 2.95 MIL/uL — ABNORMAL LOW (ref 3.87–5.11)
RDW: 14.6 % (ref 11.5–15.5)
WBC: 8.9 10*3/uL (ref 4.0–10.5)
nRBC: 0 % (ref 0.0–0.2)

## 2021-06-19 LAB — GLUCOSE, CAPILLARY
Glucose-Capillary: 212 mg/dL — ABNORMAL HIGH (ref 70–99)
Glucose-Capillary: 213 mg/dL — ABNORMAL HIGH (ref 70–99)
Glucose-Capillary: 203 mg/dL — ABNORMAL HIGH (ref 70–99)
Glucose-Capillary: 184 mg/dL — ABNORMAL HIGH (ref 70–99)
Glucose-Capillary: 235 mg/dL — ABNORMAL HIGH (ref 70–99)

## 2021-06-19 LAB — MAGNESIUM: Magnesium: 2.2 mg/dL (ref 1.7–2.4)

## 2021-06-19 MED ORDER — SPIRONOLACTONE 12.5 MG HALF TABLET
12.5000 mg | ORAL_TABLET | Freq: Every day | ORAL | Status: DC
  Administered 2021-06-19 – 2021-06-25 (×7): 12.5 mg via ORAL
  Filled 2021-06-19 (×7): qty 1

## 2021-06-19 MED ORDER — BUSPIRONE HCL 10 MG PO TABS
10.0000 mg | ORAL_TABLET | Freq: Three times a day (TID) | ORAL | Status: DC
  Administered 2021-06-19 – 2021-06-25 (×18): 10 mg via ORAL
  Filled 2021-06-19 (×8): qty 1
  Filled 2021-06-19: qty 2
  Filled 2021-06-19 (×6): qty 1
  Filled 2021-06-19: qty 2
  Filled 2021-06-19 (×2): qty 1

## 2021-06-19 MED ORDER — POTASSIUM CHLORIDE CRYS ER 20 MEQ PO TBCR
40.0000 meq | EXTENDED_RELEASE_TABLET | Freq: Once | ORAL | Status: AC
  Administered 2021-06-19: 40 meq via ORAL
  Filled 2021-06-19: qty 2

## 2021-06-19 MED ORDER — HYDRALAZINE HCL 50 MG PO TABS
50.0000 mg | ORAL_TABLET | Freq: Three times a day (TID) | ORAL | Status: DC
  Administered 2021-06-19 – 2021-06-21 (×7): 50 mg via ORAL
  Filled 2021-06-19 (×7): qty 1

## 2021-06-19 MED ORDER — METOLAZONE 2.5 MG PO TABS: 2.5000 mg | ORAL_TABLET | Freq: Once | ORAL | Status: DC

## 2021-06-19 MED ORDER — METOLAZONE 5 MG PO TABS
5.0000 mg | ORAL_TABLET | Freq: Every day | ORAL | Status: DC
  Administered 2021-06-19 – 2021-06-21 (×3): 5 mg via ORAL
  Filled 2021-06-19 (×3): qty 1

## 2021-06-19 NOTE — Progress Notes (Addendum)
Advanced Heart Failure Rounding Note  PCP-Cardiologist: None   Subjective:    Admitted w/ a/c hypoxic respiratory failure 2/2 a/c diastolic HF and recurrent left pleural effusion.   Only 950 cc in UOP charted. ? If accurate. Wt unchanged but also ? If accurate (bed wt). 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   Overall, reports subjective improvement. Breathing improved. Back down to home O2 requirement, 3L [place]. O2 sats 94%.   SCr trending back down 1.72>>1.98>>1.82  K 3.7   BP remains elevated 157W systolic.   Feels tired today.    Objective:   Weight Range: 91.6 kg Body mass index is 35.77 kg/m.   Vital Signs:   Temp:  [97.7 F (36.5 C)-98.5 F (36.9 C)] 98 F (36.7 C) (10/28 0721) Pulse Rate:  [68-73] 73 (10/28 0721) Resp:  [12-13] 13 (10/28 0721) BP: (137-190)/(54-69) 190/69 (10/28 0721) SpO2:  [93 %-98 %] 93 % (10/28 0721) Weight:  [91.3 kg-91.6 kg] 91.6 kg (10/28 0212) Last BM Date: 06/19/21  Weight change: Filed Weights   06/18/21 0500 06/18/21 1300 06/19/21 0212  Weight: 77.1 kg 91.3 kg 91.6 kg    Intake/Output:   Intake/Output Summary (Last 24 hours) at 06/19/2021 0748 Last data filed at 06/18/2021 2147 Gross per 24 hour  Intake 510 ml  Output 950 ml  Net -440 ml      Physical Exam    General:  fatigued appearing, looks much older than actual age. No respiratory difficulty HEENT: normal Neck: supple. JVD elevated to jaw. Carotids 2+ bilat; no bruits. No lymphadenopathy or thyromegaly appreciated. Cor: PMI nondisplaced. Regular rate & rhythm. No rubs, gallops or murmurs. Lungs: decreased BS at bases bilaterally L>R  Abdomen: soft, nontender, nondistended. No hepatosplenomegaly. No bruits or masses. Good bowel sounds. Extremities: no cyanosis, clubbing, rash, edema, s/p rt BKA  Neuro: alert & oriented x 3, cranial nerves grossly intact. moves all 4 extremities w/o difficulty. Affect pleasant.   Telemetry   NSR 80s, personally  reviewed   EKG    No new EKG to review   Labs    CBC Recent Labs    06/17/21 1238 06/18/21 0548 06/19/21 0258  WBC 12.9* 10.5 8.9  NEUTROABS 11.2*  --   --   HGB 7.4* 8.2* 8.0*  HCT 24.0* 24.8* 24.8*  MCV 86.6 83.5 84.1  PLT 279 262 620   Basic Metabolic Panel Recent Labs    06/18/21 0548 06/19/21 0258  NA 137 136  K 4.2 3.7  CL 103 102  CO2 28 27  GLUCOSE 184* 215*  BUN 43* 42*  CREATININE 1.98* 1.82*  CALCIUM 8.6* 8.5*  MG  --  2.2   Liver Function Tests Recent Labs    06/18/21 0548  AST 15  ALT 13  ALKPHOS 72  BILITOT 0.7  PROT 6.0*  ALBUMIN 2.6*   No results for input(s): LIPASE, AMYLASE in the last 72 hours. Cardiac Enzymes No results for input(s): CKTOTAL, CKMB, CKMBINDEX, TROPONINI in the last 72 hours.  BNP: BNP (last 3 results) Recent Labs    03/17/21 1145 06/17/21 1850 06/18/21 0548  BNP 261.8* 571.9* 650.6*    ProBNP (last 3 results) No results for input(s): PROBNP in the last 8760 hours.   D-Dimer No results for input(s): DDIMER in the last 72 hours. Hemoglobin A1C Recent Labs    06/17/21 1850  HGBA1C 6.6*   Fasting Lipid Panel No results for input(s): CHOL, HDL, LDLCALC, TRIG, CHOLHDL, LDLDIRECT in  the last 72 hours. Thyroid Function Tests No results for input(s): TSH, T4TOTAL, T3FREE, THYROIDAB in the last 72 hours.  Invalid input(s): FREET3  Other results:   Imaging    No results found.   Medications:     Scheduled Medications:  sodium chloride   Intravenous Once   amLODipine  10 mg Oral Daily   aspirin EC  81 mg Oral Daily   atorvastatin  80 mg Oral QHS   busPIRone  10 mg Oral BID   clonazePAM  0.25 mg Oral BID   cloNIDine  0.3 mg Oral BID   clopidogrel  75 mg Oral Daily   furosemide  80 mg Intravenous BID   gabapentin  100 mg Oral TID   hydrALAZINE  25 mg Oral TID   insulin aspart  0-15 Units Subcutaneous TID WC   insulin glargine-yfgn  10 Units Subcutaneous Daily   isosorbide mononitrate  120  mg Oral Daily   labetalol  300 mg Oral Q12H   pantoprazole  40 mg Oral Daily   senna  2 tablet Oral QHS   venlafaxine XR  75 mg Oral Q breakfast    Infusions:    PRN Medications: polyethylene glycol, polyvinyl alcohol   Assessment/Plan   1. Acute on chronic hypoxemic respiratory failure: 09/2019 COVID-19 viral pneumonia. She has been on home oxygen 3L [place] and said to have COPD but has never smoked.   - Suspect main issue here is combination of volume overload and recurrent L effusion  - C/w IV Lasix for diuresis  - If effusion does not improve with diuresis will need IR thoracentesis +/- Pleruex 2. Acute/chronic diastolic CHF: Multiple admissions with hypertensive urgency/diastolic CHF. Echo in 2/21 with EF 55-60%, normal RV, IVC suggesting RA pressure 8 mmHg.  [place] 3/21 showed optimized filling pressures with normal cardiac output.  With multiple diastolic CHF admissions, we placed a Cardiomems device 4/22 but later deactivated 03/16/21 due to malfunction of home pillow. Rep notified and will assist so readings can be followed again. Echo 7/22 EF 65%. Now admitted w/ a/c CHF. BNP 650. CXR w/ pulmonary edema + large left pleural effusion. - C/w 80 mg IV Lasix bid. Add 2.5 of metolazone x 1. Supp K  - Consider adding Entresto pending renal fx  - Given recurrent pleural effusions, consider w/u for amyloid 3. Pleural effusions: 09/2019 Had thoracenteses at [facility] and again bilaterally at [facility] in 3/21. Transudates with negative cytology. Suspect due to CHF.  - Has recurrent L pleural effusion - Evaluated by PCCM, Advise against repeat thoracentesis. Pleurex cath not recommended - C/w lasix 80 IV bid + 2.5 of metolazone today  4. HTN: BP elevated today Renal artery dopplers in 3/21 with nonobstructive disease.  - Continue amlodipine 10 mg daily.  - Increase hydralazine to 50 mg tid.  - Continue labetalol 300 mg bid.  - Continue clonidine 0.3 bid.   - Continue Imdur 120 mg daily  - Consider  adding Entresto 5. CKD: Stage 3.  Suspect diabetic nephropathy.  - Baseline SCr ~2.0. 1.82 today  - monitor w/ diuresis   6. Carotid stenosis: recent doppler 8/22: 1-39% RICA stenosis and 78-58% LICA stenosis  - follow yearly  7. Odynophagia: Also with GERD symptoms.  EGD (6/22) with no obvious esophageal mechanical obstruction, likely benign fundic gland polyps, cannot exclude esophageal dysmotility and gastroparesis. 8. DM: Per [place] 9. CAD: In 3/21, she had atherectomy/PCI to prox/mid LAD and PCI mid LAD. For now, no intervention on moderate  LCx disease. stable w/o CP  - Continue Imdur 120 mg daily.  - Continue ASA 81 + Plavix 75.   - Continue atorvastatin 80 mg daily. Lipids checked (4/22). - Eventually, would transition from Plavix to rivaroxaban 2.5 mg bid given CAD and PAD.  10: PAD: S/p R BKA.  Mildly decreased ABI on left in 8/21.   - Continue statin, ASA, Plavix.  - Repeat peripheral arterial dopplers (5/22) showed mild left lower arterial disease.  11. GOG: Pt reports poor QoL. Nursing home dependent. She wants to discuss [place] and interested in palliative care.  - palliative care consult placed.  Length of Stay: 1  Lyda Jester, PA-C  06/19/2021, 7:48 AM  Advanced Heart Failure Team Pager [phone] (M-F; 7a - 5p)  Please contact [facility] for night-coverage after hours (5p -7a ) and weekends on [email or web address]  Patient seen with PA, agree with the above note .  Not much UOP recorded.  Creatinine fairly stable at 1.82.  She is back on her baseline home oxygen.   General: NAD Neck: JVP 12-14 cm, no thyromegaly or thyroid nodule.  Lungs: Decreased BS left base.  CV: Nondisplaced PMI.  Heart regular S1/S2, no S3/S4, no murmur.  Trace left ankle edema.  Abdomen: Soft, nontender, no hepatosplenomegaly, no distention.  Skin: Intact without lesions or rashes.  Neurologic: Alert and oriented x 3.  Psych: Normal affect. Extremities: No clubbing or cyanosis. Rt BKA.  HEENT:  Normal.   Patient presents with acute on chronic diastolic CHF.  Echo in 7/22 with EF 60-65%, normal RV.  Unable to use Cardiomems as the pillow is broken. Creatinine is stable at 1.82.  Still volume overloaded.  - Lasix 80 mg IV bid and need to follow I/Os.  Will give a dose of metolazone 5 mg x 1 today.  - Consider SGLT2 inhibitor with CHF and CKD, but risk of GU infection may be significant.  - Will see if we can get her a new Cardiomems pillow for home.    BP remains high, increase hydralazine to 50 mg tid and add spironolactone 12.5 daily.  If creatinine stays stable with diuresis, may be able to add Entresto.    Recurrent moderate-large left effusion, has been transudative in the past.  Pulmonary recommends against repeat thoracentesis.    No chest pain, continue Plavix/ASA/statin.  Doubt ACS.   Loralie Champagne 06/19/2021 1:32 PM

## 2021-06-19 NOTE — Significant Event (Signed)
Rapid Response Event Note   Reason for Call :  Pt complained of shortness of breath then appeared to pass out  Initial Focused Assessment:  Pt quickly returned to baseline following above episode. She was slow to respond, but responses were appropriate. She was tearful and asking for staff to stay at her bedside. Lung sounds are clear, diminished. Skin is pale, warm. Heart rate is regular. Pt denies pain, headache, or dizziness. She endorses generally not feeling well. She states her breathing is difficult, although she appears unlabored. She does appear drowsy, eyes remaining open.   No acute changes noted in VS during episode.   VS: T 48F, BP 193/79, HR 71, RR 12, SpO2 95% on 3L[place] CBG: 213  Interventions:  No intervention from RR RN  Plan of Care:  -Continue telemetry monitoring -Provide reassurance and emotional support to pt -Pulmonary hygiene -Strict I&O  Event Summary:  MD Notified: Dr. Owens Shark, at bedside to evaluate Call Time: 0955 Arrival Time: 5379 End Time: [place], RN

## 2021-06-19 NOTE — Progress Notes (Signed)
Received page from rapid response RN Wilburn Cornelia that patient had an episode of passing out with immediate return to baseline. No change in vital signs, glucose wnl and   Upon my entrance to the room, RN and two RN students at bedside with patient who was awake, alert and oriented x3.  Pupils PERRLA, bilateral hand grip strength 5/5 bilaterally, no focal neurological deficits.  Patient was very tearful exclaiming "I don't want to die. I want to see my family. I want to talk to palliative care." She was asking for a staff member to sit in the room with her as she did not want to be alone.  Vital Signs: BP 193/79  HR 71 bpm RR 13 SpO2 94% on 4L [place]  PEX: General: Appears at her baseline, awake and alert CV: RRR Resp: Normal respiratory rate. No respiratory distress. On 4L [place]  Neuro: No focal deficits. Speaks in clear, fluent sentences.  Psych: Crying, anxious affect   Assessment/Plan Patient has had significant tearful episodes/anxiety since admission. Given stable clinical status/PEX with no remarkable changes, this is likely an episode of anxiety or panic attack. No concern for worsening respiratory status, CVA, hypoglycemia. - Monitor mental status - Monitor respiratory status, provide oxygen supplementation as needed - Continue current management

## 2021-06-19 NOTE — Progress Notes (Signed)
Inpatient Diabetes Program Recommendations  AACE/ADA: New Consensus Statement on Inpatient Glycemic Control (2015)  Target Ranges:  Prepandial:   less than 140 mg/dL      Peak postprandial:   less than 180 mg/dL (1-2 hours)      Critically ill patients:  140 - 180 mg/dL   Lab Results  Component Value Date   GLUCAP 203 (H) 06/19/2021   HGBA1C 6.6 (H) 06/17/2021    Review of Glycemic Control Results for CARELY, NAPPIER (MRN [id number]) as of 06/19/2021 11:54  Ref. Range 06/18/2021 15:56 06/18/2021 21:22 06/19/2021 06:12 06/19/2021 09:56 06/19/2021 11:21  Glucose-Capillary Latest Ref Range: 70 - 99 mg/dL 197 (H) 211 (H) 212 (H) 213 (H) 203 (H)   Diabetes history: DM 2 Outpatient Diabetes medications:  Novolog 2-10 units tid with meals Levemir 28 units bid Current orders for Inpatient glycemic control:  Novolog moderate tid with meals Semglee 10 units daily  Inpatient Diabetes Program Recommendations:    Please increase Semglee to 15 units daily.   Thanks,  Adah Perl, RN, BC-ADM Inpatient Diabetes Coordinator Pager [phone]  (8a-5p)

## 2021-06-19 NOTE — Progress Notes (Signed)
NAME:  Jamie [name], MRN:  [id number], DOB:  Apr 13, 1961, LOS: 1 ADMISSION DATE:  06/17/2021, CONSULTATION DATE:  06/19/2021 REFERRING MD:  Lenoria Chime, MD, CHIEF COMPLAINT:  pleural effusion   History of Present Illness:  Jamie [name] is a 60 y.o. woman with history of coronary artery disease, diastolic heart failure, chronic and recurrent pleural effusions for decompensated heart failure exacerbation.  She presented with 2 to 3 days of worsening shortness of breath, cough, increased oxygen requirements.  She has been given IV Lasix for diuresis, heart failure center, pulmonary is consulted to see if thoracentesis would be appropriate for pleural effusion.  Overnight and on presentation she had a high respiratory needs, was tachypneic, short of breath, increasing oxygen requirement.  This morning she is minimal but better.  She has been given 80 mg IV Lasix for diuresis and is responding appropriately.  However she is very down today.  She notes that she has a miserable quality of life in a nursing home and feels like she is suffering.  She says she is going to die.  She is asking if there is any quick fix for her problems.  Pertinent  Medical History  Chronic respiratory failure on 3 L Reflux Type 2 diabetes Right BKA Depression Anxiety  Significant Hospital Events: Including procedures, antibiotic start and stop dates in addition to other pertinent events     Interim History / Subjective:  Net negative 400 over last 24 hours. Feeling better. About to eat  breakfast.  Objective   Blood pressure (!) 190/69, pulse 73, temperature 98 F (36.7 C), temperature source Oral, resp. rate 13, height 5\' 3"  (1.6 m), weight 91.6 kg, SpO2 93 %.        Intake/Output Summary (Last 24 hours) at 06/19/2021 1011 Last data filed at 06/18/2021 2147 Gross per 24 hour  Intake 360 ml  Output 450 ml  Net -90 ml   Filed Weights   06/18/21 0500 06/18/21 1300 06/19/21 7619  Weight: 77.1 kg 91.3 kg 91.6 kg     Examination: Thin, elderly, chronically ill appearing No respiratory distress Breath sounds diminished bibasilar Minimal edema in lower extremities RRR, no mrg  Resolved Hospital Problem list     Assessment & Plan:   Acute on chronic hypoxemic respiratory failure Acute on chronic heart failure preserved ejection fraction, secondary to ischemic cardiomyopathy Recurrent bilateral pleural effusions, left lower than right  Feeling better but could benefit from additional diuresis. Added metolazone this morning. Continue lasix IV BID.  Palliative care consult pending.  Wean oxygen as tolerated goal saturations over 90%.  Pulmonary will follow on Monday and  as needed over the weekend.   Lenice Llamas, MD Pulmonary and [place]     Labs   CBC: Recent Labs  Lab 06/17/21 1238 06/18/21 0548 06/19/21 0258  WBC 12.9* 10.5 8.9  NEUTROABS 11.2*  --   --   HGB 7.4* 8.2* 8.0*  HCT 24.0* 24.8* 24.8*  MCV 86.6 83.5 84.1  PLT 279 262 509    Basic Metabolic Panel: Recent Labs  Lab 06/17/21 1238 06/18/21 0548 06/19/21 0258  NA 136 137 136  K 4.7 4.2 3.7  CL 102 103 102  CO2 22 28 27   GLUCOSE 254* 184* 215*  BUN 48* 43* 42*  CREATININE 1.72* 1.98* 1.82*  CALCIUM 8.7* 8.6* 8.5*  MG  --   --  2.2   GFR: Estimated Creatinine Clearance: 35.3 mL/min (A) (by C-G formula based on  SCr of 1.82 mg/dL (H)). Recent Labs  Lab 06/17/21 1238 06/18/21 0548 06/19/21 0258  PROCALCITON  --  0.28  --   WBC 12.9* 10.5 8.9  LATICACIDVEN 0.9  --   --     Liver Function Tests: Recent Labs  Lab 06/18/21 0548  AST 15  ALT 13  ALKPHOS 72  BILITOT 0.7  PROT 6.0*  ALBUMIN 2.6*   No results for input(s): LIPASE, AMYLASE in the last 168 hours. No results for input(s): AMMONIA in the last 168 hours.  ABG    Component Value Date/Time   PHART 7.379 06/18/2021 0107   PCO2ART 44.7 06/18/2021 0107   PO2ART 51.5 (L) 06/18/2021 0107   HCO3 25.8  06/18/2021 0107   TCO2 27 10/31/2019 1448   O2SAT 82.8 06/18/2021 0107     Coagulation Profile: No results for input(s): INR, PROTIME in the last 168 hours.  Cardiac Enzymes: No results for input(s): CKTOTAL, CKMB, CKMBINDEX, TROPONINI in the last 168 hours.  HbA1C: Hgb A1c MFr Bld  Date/Time Value Ref Range Status  06/17/2021 06:50 PM 6.6 (H) 4.8 - 5.6 % Final    Comment:    (NOTE) Pre diabetes:          5.7%-6.4%  Diabetes:              >6.4%  Glycemic control for   <7.0% adults with diabetes   10/23/2019 05:03 AM 6.9 (H) 4.8 - 5.6 % Final    Comment:    (NOTE) Pre diabetes:          5.7%-6.4% Diabetes:              >6.4% Glycemic control for   <7.0% adults with diabetes     CBG: Recent Labs  Lab 06/18/21 1119 06/18/21 1556 06/18/21 2122 06/19/21 0612 06/19/21 0956  GLUCAP 141* 197* 211* 212* 213*

## 2021-06-19 NOTE — Progress Notes (Signed)
FPTS Interim Progress Note  S: patient found resting comfortably. Patient satting 98% on 3L [place].   O: BP (!) 160/66 (BP Location: Right Arm)   Pulse 73   Temp 98 F (36.7 C) (Oral)   Resp 16   Ht 5\' 3"  (1.6 m)   Wt 201 lb 15.1 oz (91.6 kg)   SpO2 95%   BMI 35.77 kg/m    Physical exam General: NAD, resting peacefully Pulmonary: unlabored respirations  A/P: 60 year old female p/w SoB, found to have large L pleural effusion likely due to HFpEF exacerbation. No thoracentesis. BP was elevated, added hydral and spiro. Metolazone added to Lasix 80 mg IV BID, NN 720 over the past 24 hrs. Plan for palliative to see patient tomorrow.  -No changes, remainder per day team.   Corky Sox, MD PGY-1 [place] Intern pager: [phone], text pages welcome

## 2021-06-19 NOTE — Progress Notes (Addendum)
Family Medicine Teaching Service Daily Progress Note Intern Pager: [phone]  Patient name: Ronna Herskowitz Medical record number: [id number] Date of birth: [birth year] Age: 60 y.o. Gender: female  Primary Care Provider: Patient, No Pcp Per (Inactive) Consultants: Pulmonology, Cardiology Code Status: Full  Pt Overview and Major Events to Date:  10/26- Admitted 10/27-   Assessment and Plan: Aicia Babinski is a 60 year old female with left transudative pleural effusion (hx recurrent pleural effusions) likelt 2/2 HFpEF receiving IV diuresis and oxygen supplementation, awaiting palliative care recommendations.  Acute on chronic hypoxemic respiratory failure with left transduative pleural effusion, likely 2/2 HFpEF Remains afebrile.  Low suspicion for infection Patient is on home oxygen 3 L nasal cannula, with no respiratory distress this morning.  Heart failure team and pulmonology have been following along.  Heart failure team is considering adding Entresto pending renal function.  Has been receiving IV 80 mg Lasix for diuresis.  Pulmonology not pursuing thoracentesis at this time, and recommends conservative management with continuing diuresis with a goal of net -2 L/day.  They further would not recommend Pleurx catheter placement with her history of recurrent transudative effusions from heart failure she is at risk of developing a protein-losing enteropathy.  For past 24 hours, patient has had net -440 mL. -Continue diuresis with IV Lasix 80 mg twice daily -Start 2.5 mg metolazone x1. -Heart failure following, appreciate recommendations -Pulmonology following, appreciate recommendations  Hypertension Blood pressure elevated today to 170s to 190s over 60s. -Continue amlodipine 10 mg daily -Continue labetalol 300 mg twice daily -Continue clonidine 0.3 twice daily -Continue Imdur 120 mg daily -Increase hydralazine to 50 mg 3 times daily  Chronic normocytic anemia s/p 1U PRBC Hemoglobin stable to 8.0  today.  Baseline hemoglobin 8-9.  No signs or symptoms of bleeding.  We will resume DVT prophylaxis. -Monitor with CBC -Transfusion threshold less than 8.0 due to CAD  T2DM CBGs stable. -Continue moderate SSI -Continue Semglee 10 units daily -CBG 4 times daily, meals and bedtime  CKD stage III Baseline creatinine 2.0.  Serum creatinine 1.82 today. -Monitor renal function with diuresis, daily CMP  CAD s/p atherectomy/PCI to proximal/mid LAD and PCI mid LAD Stable.  Per heart failure, will transition from Plavix to rivaroxaban 2.5 mg twice daily given CAD and pad. -Continue Imdur 120 mg daily -Continue aspirin 81 and Plavix 75 mg daily -Continue atorvastatin 80 mg daily   Goals of care Patient has been reporting poor quality of living and she is living in a nursing home currently.  Palliative care medicine has been consulted and will follow up on their conversation with the patient to help further clarify her goals of care -Palliative care consulted, awaiting recommendations  FEN/GI: Carb modified diet PPx: Resume Lovenox today Dispo: Nursing home pending clinical improvement  Subjective:  Ms. Chelsea Primus has no complaints this morning.  She denies any shortness of breath.  She denies any anxiety, she had just woken up from resting.  She was sleepy and not speaking much.  Objective: Temp:  [97.7 F (36.5 C)-98.5 F (36.9 C)] 98.5 F (36.9 C) (10/28 0000) Pulse Rate:  [68-71] 68 (10/28 0000) Resp:  [12-13] 13 (10/28 0000) BP: (137-177)/(54-69) 177/69 (10/28 0600) SpO2:  [95 %-98 %] 97 % (10/28 0000) Weight:  [91.3 kg-91.6 kg] 91.6 kg (10/28 0212) Physical Exam: General: Resting in bed comfortably, awakened for my exam Cardiovascular: Regular rate and rhythm Respiratory: Normal respiratory rate.  Diminished breath sounds on left side.  No crackles or wheezing.  On 3 L nasal cannula with SPO2 94 to 96% Abdomen: Soft, nontender, nondistended Extremities: R lower extremity BKA, no  significant notable edema in lower extremity  Laboratory: Recent Labs  Lab 06/17/21 1238 06/18/21 0548 06/19/21 0258  WBC 12.9* 10.5 8.9  HGB 7.4* 8.2* 8.0*  HCT 24.0* 24.8* 24.8*  PLT 279 262 229   Recent Labs  Lab 06/17/21 1238 06/18/21 0548 06/19/21 0258  NA 136 137 136  K 4.7 4.2 3.7  CL 102 103 102  CO2 22 28 27   BUN 48* 43* 42*  CREATININE 1.72* 1.98* 1.82*  CALCIUM 8.7* 8.6* 8.5*  PROT  --  6.0*  --   BILITOT  --  0.7  --   ALKPHOS  --  72  --   ALT  --  13  --   AST  --  15  --   GLUCOSE 254* 184* 215*    Imaging/Diagnostic Tests: No results found.   Orvis Brill, DO 06/19/2021, 7:21 AM PGY-1, [place] Intern pager: [phone], text pages welcome

## 2021-06-20 DIAGNOSIS — F321 Major depressive disorder, single episode, moderate: Secondary | ICD-10-CM

## 2021-06-20 DIAGNOSIS — J90 Pleural effusion, not elsewhere classified: Secondary | ICD-10-CM | POA: Diagnosis not present

## 2021-06-20 DIAGNOSIS — I5031 Acute diastolic (congestive) heart failure: Secondary | ICD-10-CM | POA: Diagnosis not present

## 2021-06-20 DIAGNOSIS — Z515 Encounter for palliative care: Secondary | ICD-10-CM

## 2021-06-20 LAB — BASIC METABOLIC PANEL
Anion gap: 8 (ref 5–15)
BUN: 38 mg/dL — ABNORMAL HIGH (ref 6–20)
CO2: 26 mmol/L (ref 22–32)
Calcium: 8.8 mg/dL — ABNORMAL LOW (ref 8.9–10.3)
Chloride: 101 mmol/L (ref 98–111)
Creatinine, Ser: 1.74 mg/dL — ABNORMAL HIGH (ref 0.44–1.00)
GFR, Estimated: 33 mL/min — ABNORMAL LOW (ref >60)
Glucose, Bld: 226 mg/dL — ABNORMAL HIGH (ref 70–99)
Potassium: 3.6 mmol/L (ref 3.5–5.1)
Sodium: 135 mmol/L (ref 135–145)

## 2021-06-20 LAB — GLUCOSE, CAPILLARY
Glucose-Capillary: 216 mg/dL — ABNORMAL HIGH (ref 70–99)
Glucose-Capillary: 271 mg/dL — ABNORMAL HIGH (ref 70–99)
Glucose-Capillary: 261 mg/dL — ABNORMAL HIGH (ref 70–99)
Glucose-Capillary: 253 mg/dL — ABNORMAL HIGH (ref 70–99)

## 2021-06-20 LAB — CBC WITH DIFFERENTIAL/PLATELET
Abs Immature Granulocytes: 0.12 10*3/uL — ABNORMAL HIGH (ref 0.00–0.07)
Basophils Absolute: 0 10*3/uL (ref 0.0–0.1)
Basophils Relative: 0 %
Eosinophils Absolute: 0.3 10*3/uL (ref 0.0–0.5)
Eosinophils Relative: 4 %
HCT: 26.5 % — ABNORMAL LOW (ref 36.0–46.0)
Hemoglobin: 8.4 g/dL — ABNORMAL LOW (ref 12.0–15.0)
Immature Granulocytes: 1 %
Lymphocytes Relative: 6 %
Lymphs Abs: 0.6 10*3/uL — ABNORMAL LOW (ref 0.7–4.0)
MCH: 26.7 pg (ref 26.0–34.0)
MCHC: 31.7 g/dL (ref 30.0–36.0)
MCV: 84.1 fL (ref 80.0–100.0)
Monocytes Absolute: 0.5 10*3/uL (ref 0.1–1.0)
Monocytes Relative: 6 %
Neutro Abs: 7.7 10*3/uL (ref 1.7–7.7)
Neutrophils Relative %: 83 %
Platelets: 249 10*3/uL (ref 150–400)
RBC: 3.15 MIL/uL — ABNORMAL LOW (ref 3.87–5.11)
RDW: 14 % (ref 11.5–15.5)
WBC: 9.3 10*3/uL (ref 4.0–10.5)
nRBC: 0 % (ref 0.0–0.2)

## 2021-06-20 LAB — CBC
HCT: 24.5 % — ABNORMAL LOW (ref 36.0–46.0)
Hemoglobin: 7.9 g/dL — ABNORMAL LOW (ref 12.0–15.0)
MCH: 26.9 pg (ref 26.0–34.0)
MCHC: 32.2 g/dL (ref 30.0–36.0)
MCV: 83.3 fL (ref 80.0–100.0)
Platelets: 232 10*3/uL (ref 150–400)
RBC: 2.94 MIL/uL — ABNORMAL LOW (ref 3.87–5.11)
RDW: 14.1 % (ref 11.5–15.5)
WBC: 9.1 10*3/uL (ref 4.0–10.5)
nRBC: 0 % (ref 0.0–0.2)

## 2021-06-20 MED ORDER — VENLAFAXINE HCL ER 75 MG PO CP24
112.5000 mg | ORAL_CAPSULE | Freq: Every day | ORAL | Status: DC
  Administered 2021-06-21 – 2021-06-25 (×5): 112.5 mg via ORAL
  Filled 2021-06-20 (×5): qty 1

## 2021-06-20 MED ORDER — DEXTROSE 5 % IV SOLN
15.0000 mg/h | INTRAVENOUS | Status: DC
  Administered 2021-06-20: 12 mg/h via INTRAVENOUS
  Administered 2021-06-21: 15 mg/h via INTRAVENOUS
  Administered 2021-06-21: 12 mg/h via INTRAVENOUS
  Administered 2021-06-22: 15 mg/h via INTRAVENOUS
  Filled 2021-06-20 (×4): qty 20

## 2021-06-20 MED ORDER — NITROGLYCERIN 0.4 MG SL SUBL
0.4000 mg | SUBLINGUAL_TABLET | SUBLINGUAL | Status: DC | PRN
Start: 2021-06-20 — End: 2021-06-25

## 2021-06-20 MED ORDER — POTASSIUM CHLORIDE CRYS ER 20 MEQ PO TBCR
40.0000 meq | EXTENDED_RELEASE_TABLET | ORAL | Status: AC
  Administered 2021-06-20 (×2): 40 meq via ORAL
  Filled 2021-06-20 (×2): qty 2

## 2021-06-20 MED ORDER — SACUBITRIL-VALSARTAN 24-26 MG PO TABS
1.0000 | ORAL_TABLET | Freq: Two times a day (BID) | ORAL | Status: DC
  Administered 2021-06-20 – 2021-06-25 (×11): 1 via ORAL
  Filled 2021-06-20 (×11): qty 1

## 2021-06-20 MED ORDER — POTASSIUM CHLORIDE CRYS ER 20 MEQ PO TBCR: 40.0000 meq | EXTENDED_RELEASE_TABLET | Freq: Once | ORAL | Status: DC

## 2021-06-20 MED ORDER — SODIUM CHLORIDE 0.9 % IV SOLN: INTRAVENOUS | Status: DC

## 2021-06-20 MED ORDER — VENLAFAXINE HCL ER 37.5 MG PO CP24: 37.5000 mg | ORAL_CAPSULE | Freq: Every day | ORAL | Status: DC

## 2021-06-20 NOTE — Consult Note (Signed)
[facility] Consult   Reason for Consult:''Significant depression, possible hx of bipolar Assistance with med management, patient's depression interfering w/goals of care discussions.'' Referring Physician:  Pay Joycelyn Schmid, MD Patient Identification: Rayhana Slider MRN:  [id number] Principal Diagnosis: Major depressive disorder, single episode, moderate ([place]) Diagnosis:  Principal Problem:   Major depressive disorder, single episode, moderate (HCC) Active Problems:   CHF (congestive heart failure) (HCC)   Acute diastolic CHF (congestive heart failure) (HCC)   Acute respiratory insufficiency   Shortness of breath   Recurrent left pleural effusion   Coronary artery disease   Total Time spent with patient: 1 hour  Subjective:   Mayda Shippee is a 60 y.o. female patient admitted with shortness of breath.  HPI:   60 y.o. who carries the diagnoses of COPD, T2DM, HTN, PAD, GERD, CKD, anxiety, depression, and right BKA. She was admitted to the hospital due to shortness of breath secondary to L pleural effusion likely due to HFpEF exacerbation. Patient was referred to psychiatric service due to worsening depression. Patient states that she lives in a nursing home and she has been getting more depressed since 2020 due to worsening medical issues and her inability to see her grandchildren since Covid virus pandemic. Patient is endorsing favorable response to Effexor and Buspar but says she might need some adjustment. Today, she denies psychosis, delusions, anxiety and self harming thoughts.  Past Psychiatric History: as above  Risk to Self:  denies Risk to Others:  denies Prior Inpatient Therapy:   Prior Outpatient Therapy:    Past Medical History:  Past Medical History:  Diagnosis Date   Amputation below knee ([facility]) RT    CHF (congestive heart failure) (HCC)    Chronic diastolic HF (heart failure) ([place]) 10/21/2019   COPD (chronic obstructive pulmonary disease) (HCC)    Diabetes type 2,  uncontrolled (HCC)    Gastritis and duodenitis    HTN (hypertension) 10/21/2019   PAD (peripheral artery disease) ([place]) 10/21/2019   PAD (peripheral artery disease) (HCC)    PNA (pneumonia)    TIA (transient ischemic attack)     Past Surgical History:  Procedure Laterality Date   BELOW KNEE LEG AMPUTATION Right    CORONARY ATHERECTOMY N/A 11/02/2019   Procedure: CORONARY ATHERECTOMY;  Surgeon: Martinique, Peter M, MD;  Location: [facility];  Service: Cardiovascular;  Laterality: N/A;   CORONARY STENT INTERVENTION N/A 11/02/2019   Procedure: CORONARY STENT INTERVENTION;  Surgeon: Martinique, Peter M, MD;  Location: [facility];  Service: Cardiovascular;  Laterality: N/A;   PRESSURE SENSOR/CARDIOMEMS N/A 10/31/2019   Procedure: PRESSURE SENSOR/CARDIOMEMS;  Surgeon: Larey Dresser, MD;  Location: [facility];  Service: Cardiovascular;  Laterality: N/A;   RIGHT/LEFT HEART CATH AND CORONARY ANGIOGRAPHY N/A 10/31/2019   Procedure: RIGHT/LEFT HEART CATH AND CORONARY ANGIOGRAPHY;  Surgeon: Larey Dresser, MD;  Location: [facility];  Service: Cardiovascular;  Laterality: N/A;   Family History:  Family History  Problem Relation Age of Onset   Hypertension Brother    Diabetes Brother    Hypertension Maternal Grandmother    Heart attack Paternal Grandmother    Family Psychiatric  History:  Social History:  Social History   Substance and Sexual Activity  Alcohol Use Never     Social History   Substance and Sexual Activity  Drug Use Never    Social History   Socioeconomic History   Marital status: Unknown    Spouse name: Not on file  Number of children: Not on file   Years of education: Not on file   Highest education level: Not on file  Occupational History   Not on file  Tobacco Use   Smoking status: Never   Smokeless tobacco: Never  Substance and Sexual Activity   Alcohol use: Never   Drug use: Never   Sexual activity: Not on file  Other Topics  Concern   Not on file  Social History Narrative   Not on file   Social Determinants of Health   Financial Resource Strain: Not on file  Food Insecurity: Not on file  Transportation Needs: Not on file  Physical Activity: Not on file  Stress: Not on file  Social Connections: Not on file   Additional Social History:    Allergies:   Allergies  Allergen Reactions   Codeine Other (See Comments)    headaches   Meperidine And Related Other (See Comments)    headaches   Tetracyclines & Related Other (See Comments)    Caused yeast infections   Tuberculin Tests Other (See Comments)    Labs:  Results for orders placed or performed during the hospital encounter of 06/17/21 (from the past 48 hour(s))  CBC     Status: Abnormal   Collection Time: 06/19/21  2:58 AM  Result Value Ref Range   WBC 8.9 4.0 - 10.5 K/uL   RBC 2.95 (L) 3.87 - 5.11 MIL/uL   Hemoglobin 8.0 (L) 12.0 - 15.0 g/dL   HCT 24.8 (L) 36.0 - 46.0 %   MCV 84.1 80.0 - 100.0 fL   MCH 27.1 26.0 - 34.0 pg   MCHC 32.3 30.0 - 36.0 g/dL   RDW 14.6 11.5 - 15.5 %   Platelets 229 150 - 400 K/uL   nRBC 0.0 0.0 - 0.2 %    Comment: Performed at [facility], [place] [address] [place], [place] [address]  Basic metabolic panel     Status: Abnormal   Collection Time: 06/19/21  2:58 AM  Result Value Ref Range   Sodium 136 135 - 145 mmol/L   Potassium 3.7 3.5 - 5.1 mmol/L   Chloride 102 98 - 111 mmol/L   CO2 27 22 - 32 mmol/L   Glucose, Bld 215 (H) 70 - 99 mg/dL    Comment: Glucose reference range applies only to samples taken after fasting for at least 8 hours.   BUN 42 (H) 6 - 20 mg/dL   Creatinine, Ser 1.82 (H) 0.44 - 1.00 mg/dL   Calcium 8.5 (L) 8.9 - 10.3 mg/dL   GFR, Estimated 31 (L) >60 mL/min    Comment: (NOTE) Calculated using the CKD-EPI Creatinine Equation (2021)    Anion gap 7 5 - 15    Comment: Performed at [facility] [address], [place],  [address]  Magnesium     Status: None    Collection Time: 06/19/21  2:58 AM  Result Value Ref Range   Magnesium 2.2 1.7 - 2.4 mg/dL    Comment: Performed at [facility] [address], [place], [place] [address]  Glucose, capillary     Status: Abnormal   Collection Time: 06/19/21  6:12 AM  Result Value Ref Range   Glucose-Capillary 212 (H) 70 - 99 mg/dL    Comment: Glucose reference range applies only to samples taken after fasting for at least 8 hours.   Comment 1 Notify RN    Comment 2 Document in Chart   Glucose,  capillary     Status: Abnormal   Collection Time: 06/19/21  9:56 AM  Result Value Ref Range   Glucose-Capillary 213 (H) 70 - 99 mg/dL    Comment: Glucose reference range applies only to samples taken after fasting for at least 8 hours.  Glucose, capillary     Status: Abnormal   Collection Time: 06/19/21 11:21 AM  Result Value Ref Range   Glucose-Capillary 203 (H) 70 - 99 mg/dL    Comment: Glucose reference range applies only to samples taken after fasting for at least 8 hours.  Glucose, capillary     Status: Abnormal   Collection Time: 06/19/21  4:13 PM  Result Value Ref Range   Glucose-Capillary 184 (H) 70 - 99 mg/dL    Comment: Glucose reference range applies only to samples taken after fasting for at least 8 hours.  Glucose, capillary     Status: Abnormal   Collection Time: 06/19/21  9:11 PM  Result Value Ref Range   Glucose-Capillary 235 (H) 70 - 99 mg/dL    Comment: Glucose reference range applies only to samples taken after fasting for at least 8 hours.   Comment 1 Notify RN    Comment 2 Document in Chart   CBC     Status: Abnormal   Collection Time: 06/20/21  3:29 AM  Result Value Ref Range   WBC 9.1 4.0 - 10.5 K/uL   RBC 2.94 (L) 3.87 - 5.11 MIL/uL   Hemoglobin 7.9 (L) 12.0 - 15.0 g/dL   HCT 24.5 (L) 36.0 - 46.0 %   MCV 83.3 80.0 - 100.0 fL   MCH 26.9 26.0 - 34.0 pg   MCHC 32.2 30.0 - 36.0 g/dL   RDW 14.1 11.5 - 15.5 %   Platelets 232 150 - 400 K/uL   nRBC 0.0 0.0 - 0.2 %    Comment:  Performed at [facility], [place] [address] [place], [place] [address]  Basic metabolic panel     Status: Abnormal   Collection Time: 06/20/21  3:29 AM  Result Value Ref Range   Sodium 135 135 - 145 mmol/L   Potassium 3.6 3.5 - 5.1 mmol/L   Chloride 101 98 - 111 mmol/L   CO2 26 22 - 32 mmol/L   Glucose, Bld 226 (H) 70 - 99 mg/dL    Comment: Glucose reference range applies only to samples taken after fasting for at least 8 hours.   BUN 38 (H) 6 - 20 mg/dL   Creatinine, Ser 1.74 (H) 0.44 - 1.00 mg/dL   Calcium 8.8 (L) 8.9 - 10.3 mg/dL   GFR, Estimated 33 (L) >60 mL/min    Comment: (NOTE) Calculated using the CKD-EPI Creatinine Equation (2021)    Anion gap 8 5 - 15    Comment: Performed at [facility] [address], [place], [place] [address]  Glucose, capillary     Status: Abnormal   Collection Time: 06/20/21  6:24 AM  Result Value Ref Range   Glucose-Capillary 216 (H) 70 - 99 mg/dL    Comment: Glucose reference range applies only to samples taken after fasting for at least 8 hours.   Comment 1 Notify RN    Comment 2 Document in Chart   CBC with Differential/Platelet     Status: Abnormal   Collection Time: 06/20/21 11:44 AM  Result Value Ref Range   WBC 9.3 4.0 - 10.5 K/uL   RBC 3.15 (L) 3.87 - 5.11 MIL/uL   Hemoglobin 8.4 (  L) 12.0 - 15.0 g/dL   HCT 26.5 (L) 36.0 - 46.0 %   MCV 84.1 80.0 - 100.0 fL   MCH 26.7 26.0 - 34.0 pg   MCHC 31.7 30.0 - 36.0 g/dL   RDW 14.0 11.5 - 15.5 %   Platelets 249 150 - 400 K/uL   nRBC 0.0 0.0 - 0.2 %   Neutrophils Relative % 83 %   Neutro Abs 7.7 1.7 - 7.7 K/uL   Lymphocytes Relative 6 %   Lymphs Abs 0.6 (L) 0.7 - 4.0 K/uL   Monocytes Relative 6 %   Monocytes Absolute 0.5 0.1 - 1.0 K/uL   Eosinophils Relative 4 %   Eosinophils Absolute 0.3 0.0 - 0.5 K/uL   Basophils Relative 0 %   Basophils Absolute 0.0 0.0 - 0.1 K/uL   Immature Granulocytes 1 %   Abs Immature Granulocytes 0.12 (H) 0.00 - 0.07 K/uL    Comment: Performed at  [facility] [address] [place], [place] [address]  Glucose, capillary     Status: Abnormal   Collection Time: 06/20/21 12:10 PM  Result Value Ref Range   Glucose-Capillary 271 (H) 70 - 99 mg/dL    Comment: Glucose reference range applies only to samples taken after fasting for at least 8 hours.  Glucose, capillary     Status: Abnormal   Collection Time: 06/20/21  4:46 PM  Result Value Ref Range   Glucose-Capillary 261 (H) 70 - 99 mg/dL    Comment: Glucose reference range applies only to samples taken after fasting for at least 8 hours.  Glucose, capillary     Status: Abnormal   Collection Time: 06/20/21  8:59 PM  Result Value Ref Range   Glucose-Capillary 253 (H) 70 - 99 mg/dL    Comment: Glucose reference range applies only to samples taken after fasting for at least 8 hours.   Comment 1 Notify RN    Comment 2 Document in Chart     Current Facility-Administered Medications  Medication Dose Route Frequency Provider Last Rate Last Admin   0.9 %  sodium chloride infusion   Intravenous Continuous Lenoria Chime, MD 10 mL/hr at 06/20/21 1426 New Bag at 06/20/21 1426   amLODipine (NORVASC) tablet 10 mg  10 mg Oral Daily Lyda Jester M, PA-C   10 mg at 06/20/21 1008   aspirin EC tablet 81 mg  81 mg Oral Daily Lyda Jester M, PA-C   81 mg at 06/20/21 1008   atorvastatin (LIPITOR) tablet 80 mg  80 mg Oral QHS Lyda Jester M, PA-C   80 mg at 06/20/21 2133   busPIRone (BUSPAR) tablet 10 mg  10 mg Oral TID Orvis Brill, DO   10 mg at 06/20/21 2135   clonazePAM (KLONOPIN) disintegrating tablet 0.25 mg  0.25 mg Oral BID Alcus Dad, MD   0.25 mg at 06/20/21 2135   cloNIDine (CATAPRES) tablet 0.3 mg  0.3 mg Oral BID Lyda Jester M, PA-C   0.3 mg at 06/20/21 2134   clopidogrel (PLAVIX) tablet 75 mg  75 mg Oral Daily Lyda Jester M, PA-C   75 mg at 06/20/21 1010   furosemide (LASIX) 200 mg in dextrose 5 % 100 mL (2 mg/mL) infusion  12 mg/hr  Intravenous Continuous Larey Dresser, MD 6 mL/hr at 06/20/21 1424 12 mg/hr at 06/20/21 1424   gabapentin (NEURONTIN) capsule 100 mg  100 mg Oral TID Consuelo Pandy, PA-C   100 mg at 06/20/21 2134  hydrALAZINE (APRESOLINE) tablet 50 mg  50 mg Oral TID Lyda Jester M, PA-C   50 mg at 06/20/21 2134   insulin aspart (novoLOG) injection 0-15 Units  0-15 Units Subcutaneous TID WC Consuelo Pandy, PA-C   8 Units at 06/20/21 1718   insulin glargine-yfgn [facility]) injection 10 Units  10 Units Subcutaneous Daily Consuelo Pandy, PA-C   10 Units at 06/20/21 1008   isosorbide mononitrate (IMDUR) 24 hr tablet 120 mg  120 mg Oral Daily Lyda Jester M, PA-C   120 mg at 06/20/21 1008   labetalol (NORMODYNE) tablet 300 mg  300 mg Oral Q12H Lyda Jester M, PA-C   300 mg at 06/20/21 2132   metolazone (ZAROXOLYN) tablet 5 mg  5 mg Oral Daily Spero Geralds, MD   5 mg at 06/20/21 1008   nitroGLYCERIN (NITROSTAT) SL tablet 0.4 mg  0.4 mg Sublingual Q5 min PRN Lenoria Chime, MD       pantoprazole (PROTONIX) EC tablet 40 mg  40 mg Oral Daily Lyda Jester M, PA-C   40 mg at 06/20/21 1008   polyethylene glycol (MIRALAX / GLYCOLAX) packet 17 g  17 g Oral Daily PRN Lyda Jester M, PA-C       polyvinyl alcohol (LIQUIFILM TEARS) 1.4 % ophthalmic solution 1 drop  1 drop Both Eyes PRN Lyda Jester M, PA-C       sacubitril-valsartan (ENTRESTO) 24-26 mg per tablet  1 tablet Oral BID Larey Dresser, MD   1 tablet at 06/20/21 2133   senna (SENOKOT) tablet 17.2 mg  2 tablet Oral QHS Lyda Jester M, PA-C   17.2 mg at 06/20/21 2136   spironolactone (ALDACTONE) tablet 12.5 mg  12.5 mg Oral Daily Larey Dresser, MD   12.5 mg at 06/20/21 1008   venlafaxine XR (EFFEXOR-XR) 24 hr capsule 75 mg  75 mg Oral Q breakfast Lyda Jester M, PA-C   75 mg at 06/20/21 1008    Musculoskeletal: Strength & Muscle Tone:  unable to assess Gait & Station: unsteady Patient leans:  N/A    Psychiatric Specialty Exam:  Presentation  General Appearance: Appropriate for Environment  Eye Contact:Good  Speech:Clear and Coherent; Slow  Speech Volume:Decreased  Handedness:Right   Mood and Affect  Mood:Dysphoric  Affect:Constricted   Thought Process  Thought Processes:Linear  Descriptions of Associations:Intact  Orientation:Full (Time, Place and Person)  Thought Content:Logical  History of Schizophrenia/Schizoaffective disorder:No data recorded Duration of Psychotic Symptoms:No data recorded Hallucinations:Hallucinations: None  Ideas of Reference:None  Suicidal Thoughts:Suicidal Thoughts: No  Homicidal Thoughts:Homicidal Thoughts: No   Sensorium  Memory:Immediate Fair; Remote Fair; Recent Fair  Judgment:Fair  Insight:Fair   Executive Functions  Concentration:Fair  Attention Span:Fair  [place]   Psychomotor Activity  Psychomotor Activity:Psychomotor Activity: Psychomotor Retardation   Assets  Assets:Communication Skills; Desire for Improvement   Sleep  Sleep:Sleep: Fair   Physical Exam: Physical Exam ROS Blood pressure (!) 146/60, pulse 68, temperature 97.9 F (36.6 C), temperature source Oral, resp. rate 17, height 5\' 3"  (1.6 m), weight 90.3 kg, SpO2 97 %. Body mass index is 35.26 kg/m.  Treatment Plan Summary: 60 year old female with multiple medical problems who reports being depressed due to her her medical problems and lack of access to her grandchildren due to Covid pandemic. Patient wants her medications adjusted to address symptoms of depression.  Recommendations: -Continue Buspar for anxiety -Continue Effexor-XR 75 MG and add Effexor XR 37.5 mg daily for depression -  Patient will benefit from counseling and outpatient psychiatric referral upon discharge   Disposition: No evidence of imminent risk to self or others at present.   Patient does not meet criteria for  psychiatric inpatient admission. Supportive therapy provided about ongoing stressors. Psychiatric service signing out. Re-consult as needed   Corena Pilgrim, MD 06/20/2021 11:08 PM

## 2021-06-20 NOTE — NC FL2 (Signed)
[place] LEVEL OF CARE SCREENING TOOL     IDENTIFICATION  Patient Name: Jamie [name] Birthdate: 02/28/1961 Sex: female Admission Date (Current Location): 06/17/2021  [facility] and [place] Number:  [employer] and Address:  [facility], [place] [address] [place], [place] [address]      Provider Number: [id number]  Attending Physician Name and Address:  Lenoria Chime, MD  Relative Name and Phone Number:       Current Level of Care: Hospital Recommended Level of Care: [place] Prior Approval Number:    Date Approved/Denied:   PASRR Number: [id number]  Discharge Plan: SNF    Current Diagnoses: Patient Active Problem List   Diagnosis Date Noted   Shortness of breath 06/17/2021   Recurrent left pleural effusion 06/17/2021   Coronary artery disease 06/17/2021   Palliative care by specialist    Acute renal failure [facility])    DNR (do not resuscitate) discussion    Acute respiratory insufficiency    Chronic diastolic HF (heart failure) ([place]) 10/21/2019   HTN (hypertension) 10/21/2019   PAD (peripheral artery disease) ([place]) 10/21/2019   CHF (congestive heart failure) ([place]) 02/63/7858   Acute diastolic CHF (congestive heart failure) ([place])     Orientation RESPIRATION BLADDER Height & Weight     Self, Time, Situation, Place  O2 (4L nasal cannula) Incontinent, External catheter Weight: 199 lb 1.2 oz (90.3 kg) Height:  5\' 3"  (160 cm)  BEHAVIORAL SYMPTOMS/MOOD NEUROLOGICAL BOWEL NUTRITION STATUS      Continent Diet (see dc summary)  AMBULATORY STATUS COMMUNICATION OF NEEDS Skin   Limited Assist Verbally Normal                       Personal Care Assistance Level of Assistance  Bathing, Feeding, Dressing Bathing Assistance: Limited assistance Feeding assistance: Limited assistance Dressing Assistance: Limited assistance     Functional Limitations Info             SPECIAL CARE FACTORS FREQUENCY  PT  (By licensed PT), OT (By licensed OT)     PT Frequency: 3x/week OT Frequency: 3x/week            Contractures Contractures Info: Not present    Additional Factors Info  Code Status, Allergies, Psychotropic, Insulin Sliding Scale Code Status Info: DNR Allergies Info: Codeine, Meperidine And Related, Tetracyclines & Related, Tuberculin Tests Psychotropic Info: Buspar; Klonopin; clonodine; Effexor Insulin Sliding Scale Info: see dc summary       Current Medications (06/20/2021):  This is the current hospital active medication list Current Facility-Administered Medications  Medication Dose Route Frequency Provider Last Rate Last Admin   0.9 %  sodium chloride infusion   Intravenous Continuous Pray, Norwood Levo, MD       amLODipine (NORVASC) tablet 10 mg  10 mg Oral Daily Lyda Jester M, PA-C   10 mg at 06/20/21 1008   aspirin EC tablet 81 mg  81 mg Oral Daily Lyda Jester M, PA-C   81 mg at 06/20/21 1008   atorvastatin (LIPITOR) tablet 80 mg  80 mg Oral QHS Lyda Jester M, PA-C   80 mg at 06/19/21 2106   busPIRone (BUSPAR) tablet 10 mg  10 mg Oral TID Orvis Brill, DO   10 mg at 06/20/21 1008   clonazePAM (KLONOPIN) disintegrating tablet 0.25 mg  0.25 mg Oral BID Alcus Dad, MD   0.25 mg at 06/20/21 1008   cloNIDine (CATAPRES) tablet 0.3 mg  0.3 mg Oral BID Lyda Jester M, PA-C   0.3 mg at 06/20/21 1008   clopidogrel (PLAVIX) tablet 75 mg  75 mg Oral Daily Lyda Jester M, PA-C   75 mg at 06/20/21 1010   furosemide (LASIX) 200 mg in dextrose 5 % 100 mL (2 mg/mL) infusion  12 mg/hr Intravenous Continuous Larey Dresser, MD       gabapentin (NEURONTIN) capsule 100 mg  100 mg Oral TID Lyda Jester M, PA-C   100 mg at 06/20/21 1008   hydrALAZINE (APRESOLINE) tablet 50 mg  50 mg Oral TID Consuelo Pandy, PA-C   50 mg at 06/20/21 1008   insulin aspart (novoLOG) injection 0-15 Units  0-15 Units Subcutaneous TID WC Consuelo Pandy, PA-C   2  Units at 06/20/21 8592   insulin glargine-yfgn [facility]) injection 10 Units  10 Units Subcutaneous Daily Consuelo Pandy, PA-C   10 Units at 06/20/21 1008   isosorbide mononitrate (IMDUR) 24 hr tablet 120 mg  120 mg Oral Daily Lyda Jester M, PA-C   120 mg at 06/20/21 1008   labetalol (NORMODYNE) tablet 300 mg  300 mg Oral Q12H Lyda Jester M, PA-C   300 mg at 06/20/21 1008   metolazone (ZAROXOLYN) tablet 5 mg  5 mg Oral Daily Spero Geralds, MD   5 mg at 06/20/21 1008   nitroGLYCERIN (NITROSTAT) SL tablet 0.4 mg  0.4 mg Sublingual Q5 min PRN Lenoria Chime, MD       pantoprazole (PROTONIX) EC tablet 40 mg  40 mg Oral Daily Lyda Jester M, PA-C   40 mg at 06/20/21 1008   polyethylene glycol (MIRALAX / GLYCOLAX) packet 17 g  17 g Oral Daily PRN Lyda Jester M, PA-C       polyvinyl alcohol (LIQUIFILM TEARS) 1.4 % ophthalmic solution 1 drop  1 drop Both Eyes PRN Rosita Fire, Brittainy M, PA-C       potassium chloride SA (KLOR-CON) CR tablet 40 mEq  40 mEq Oral Q4H Alcus Dad, MD       sacubitril-valsartan (ENTRESTO) 24-26 mg per tablet  1 tablet Oral BID Larey Dresser, MD       senna [facility]) tablet 17.2 mg  2 tablet Oral QHS Lyda Jester M, PA-C   17.2 mg at 06/19/21 2106   spironolactone (ALDACTONE) tablet 12.5 mg  12.5 mg Oral Daily Larey Dresser, MD   12.5 mg at 06/20/21 1008   venlafaxine XR (EFFEXOR-XR) 24 hr capsule 75 mg  75 mg Oral Q breakfast Lyda Jester M, PA-C   75 mg at 06/20/21 1008     Discharge Medications: Please see discharge summary for a list of discharge medications.  Relevant Imaging Results:  Relevant Lab Results:   Additional Information [id number]  Benard Halsted, LCSW

## 2021-06-20 NOTE — Consult Note (Signed)
Palliative Medicine Inpatient Consult Note  Consulting Provider: Spero Geralds, MD  Reason for consult:   [facility] Consult  Reason for Consult? heart failure, recurrent admissions   HPI:  Per intake H&P --> Jamie [name] is a 60 y.o. female presenting with shortness of breath secondary to left side pleural effusion. PMH is significant for recurrent pleural effusions, CHF, COPD, type 2 diabetes, HTN, PAD, right BKA, CKD, GERD.  Palliative care saw Jamie [name] in March of 2021 at that time her goals were full scope of care.  Palliative care was consulted inpatient to further aid in goals of care discussions in the setting of recurrent admissions.  Clinical Assessment/Goals of Care:  *Please note that this is a verbal dictation therefore any spelling or grammatical errors are due to the "[place]" system interpretation.  I have reviewed medical records including EPIC notes, labs and imaging, received report from bedside RN, assessed the patient who was lying in bed and upon meeting me he was tearful stating "nobody cares about me".    I met with Jamie [name] to further discuss diagnosis prognosis, GOC, EOL wishes, disposition and options.  Jamie [name] and I reviewed her past medical history of diabetes, congestive heart failure, chronic obstructive pulmonary disease, peripheral artery disease inclusive of a right below the knee amputation.   I introduced Palliative Medicine as specialized medical care for people living with serious illness. It focuses on providing relief from the symptoms and stress of a serious illness. The goal is to improve quality of life for both the patient and the family.  Jamie [name] states that she is from [place], [place].  She is lived in [place] throughout the duration of her life.  She has been married to her spouse, Jamie [name] for the past 40+ years.  She has 1 son, Jamie [name] and 2 grandchildren.  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  She shares that she used to get joy out of being outside and gardening.  She expresses that her greatest joy in life is her grandchildren and states "I enjoy being with them most".  She expresses that she is a woman of faith and practices within the [facility] denomination.  Prior to admission Jamie [name] had been living at [facility] rehabilitation in [place] where she has been for a number of years now.  She is able to pivot to the wheelchair in the chair.  She is able to stand with a walker.  She shares that she does not have a prosthesis for her right below the knee amputation though she would be very interested in obtaining 1 if that is a possibility.  She shares that she is able to perform basic hygiene, dress herself, and feed herself.  From a symptom perspective Jamie [name] expresses "I cannot breathe".  I shared with her that it appears she can breathe as she is not labored in her breathing and her oxygen saturations are normal.  I asked her to take some deep breaths in and out as it appears she is feeling anxious at this time which she was able to do with relief of symptoms.  I shared with her history of congestive heart failure and COPD there are low-dose medications from a palliative perspective that could be provided to aid in symptom relief inclusive of benzodiazepines for anxiety as well as low-dose opioid such as morphine for breathlessness.  Jamie [name] and I talked at length about how  helpless, worthless, and invalid she feels.  She shares with me that she has struggled with these feelings for a very long time and they have been exacerbated since she had her below the knee amputation and had been living at [facility].  She expresses that she wishes she could see her family more often only but "they do not care about me".  I asked her why she feels no one cares about her and she shares that this is the experience she has had over the years.  She states  that her husband "never visits me".  She also goes on to tell me that a CNA at the nursing told her before hospital admission to "stop being stupid" when Jamie [name] shared that she felt she could not breathe.  Jamie [name] shares with me that she "got dealt a bad hand" since her time of birth.  I asked her to explain that a little bit more. She stated that in between she and her brother her mother had a child who passed away and it was evident to Jamie [name] that her brother was her mother's favorite child.  Jamie [name] goes on to tell me that "I cannot please her" in reference to her mother but does not go into great detail beyond that.  Jamie [name] states that no one has ever seen her for her worth.  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  I asked Jamie [name] if there is anything in this life which brings her joy any longer.  She states that there is not, other than her grandchildren.  I shared with Jamie [name] that I believe she is a lot more capable than she thinks she is.  I asked her if she would be willing to get out of the bed to the chair with me which she consented to.  She was able to stand up and pivot without any assistance and did very well.  We spoke about this triumph and I shared that most people could not do that with only 1 leg so she should be proud of this accomplishment.  I set her up for her meal during this time.  Jamie [name] continued to ask me throughout our time together "are you going to leave me alone like everyone else".  I shared with her that we are here to help her and our goal is to improve the quality of the life she is living.  Concepts specific to code status, artifical feeding and hydration, continued IV antibiotics and rehospitalization was had.  I introduced a MOST form and we filled it out together with the following wishes.  Jamie [name] had in the past been intubated before and shared with me  that it was painful and she would not want this intervention again. I completed a MOST form today. The patient and family outlined their wishes for the following treatment decisions:  Cardiopulmonary Resuscitation: Do Not Attempt Resuscitation (DNR/No CPR)  Medical Interventions: Limited Additional Interventions: Use medical treatment, IV fluids and cardiac monitoring as indicated, DO NOT USE intubation or mechanical ventilation. May consider use of less invasive airway support such as BiPAP or CPAP. Also provide comfort measures. Transfer to the hospital if indicated. Avoid intensive care.   Antibiotics: Determine use of limitation of antibiotics when infection occurs  IV Fluids: IV fluids for a defined trial period  Feeding  Tube: No feeding tube   Jamie [name] and I reviewed the idea of hospice care. I described hospice as a service for patients for have a life expectancy of < 6 months. It preserves dignity and quality at the end phases of life. The focus changes from curative to symptom relief.  I shared with her that based upon what she is told me - that she wants the ability to go back and forth to the hospital if she has shortness of breath, pneumonia, urinary infection, or other diagnoses which require in-hospital treatment that it does not appear she is at a point where she is mentally or emotionally truly ready for hospice.  She does seem to acknowledge this.  I reviewed with Jamie [name] that she seems extremely depressed.  She appears to not be able to see beyond that at this time and I suspect the best thing we could do for her would to develop a plan to treat her depression.  Jamie [name] is in agreement with this.  Discussed the importance of continued conversation with family and their  medical providers regarding overall plan of care and treatment options, ensuring decisions are within the context of the patients values and GOCs.  _________________________________________________ Addendum:  I spoke over  the phone to patient's son, Jamie [name] and daughter-in-law Jamie [name].  They provided additional insights into Laura's clinical state.  I first asked about Denisia's psychiatric history given what appears to be a pretty significant depression.Jamie [name] expresses that there is a family history of bipolar disorder which is fairly severe as Katye's mother suffers from this.  He shares that Yoceline has been on Prozac and Valium for pretty much his whole life.  He expresses that as a child Annie some strange episodes requiring them to seek medical care such as "things crawling all over her" but when it came time for actual evaluation Hamsini would say "I am fine".  He expresses that in the past Gladiola was seeing a psychiatrist through [facility] and also had been an inpatient in the psychiatric ward at [facility].  In terms of Brandon's ability to help Dorethia -this dynamic is very complicated. Jamie [name] and [place] both attest to trying to offer as much support as they can though being manipulated by [facility] and needing to put up strict boundaries because of this.   Mithra over the years has been notably noncompliant with medical treatment.  Patient's son and daughter-in-law use the example of her diabetes mellitus in which she did not manage as was advised to do.  Her family shares that a lot of her health ailments are much to do with her own noncompliance.  It is obvious that [place] and Jamie [name] have their own degree of distress over the situation in the years which they have tried to support Nattie to the best of their ability and been told things such as "they do not care".   Jamie [name] shares that she has had a bilateral lung transplant and due to this she has her own degree of immunocompromise.  She states that she always would allow the grandchildren to visit Carliyah though it got to a point where Lamona was not even acknowledging their presence and the grandchildren themselves requested to not visit any more.  Jamie [name] took some  out of that situation in an effort to not sever ties with Leontyne if she herself was disinterested at that time.  Patient's spouse has his own health ailments though per Jamie [name] he does visit Jemimah regularly at the rehab  facility.  Patient's family expresses what a complicated situation this is and at the end of the day their goals are to provide support to [place] as best they can.  Decision Maker: Annlee Glandon (son ) [phone]; Patients spouse has health ailments making additional decision making complicated for him.  SUMMARY OF RECOMMENDATIONS   DNAR/DNI  MOST/DNR in [facility]  Patient appears to have a significant psych history on her maternal side would benefit from seeing IP psychiatry  Patient is welcoming of spiritual visits  OP Palliative care recommended  Ongoing support  Code Status/Advance Care Planning: DNAR/DNI   Palliative Prophylaxis:  Oral care, mobility,  Additional Recommendations (Limitations, Scope, Preferences): Treat what is treatable  Psycho-social/Spiritual:  Desire for further Chaplaincy support: Yes-[facility] Additional Recommendations: Education on depression   Prognosis: Unclear at this time  Discharge Planning: Patient will discharge back to [facility] when medically optimized  Vitals:   06/19/21 1942 06/20/21 0000  BP: (!) 160/66 (!) 164/73  Pulse: 73 68  Resp: 16 14  Temp: 98 F (36.7 C) 97.6 F (36.4 C)  SpO2: 95% 97%    Intake/Output Summary (Last 24 hours) at 06/20/2021 0654 Last data filed at 06/20/2021 0011 Gross per 24 hour  Intake 580 ml  Output 1750 ml  Net -1170 ml   Last Weight  Most recent update: 06/20/2021  1:30 AM    Weight  90.3 kg (199 lb 1.2 oz)            Gen: Older female in no acute distress this morning HEENT: moist mucous membranes CV: Regular rate and rhythm  PULM: Breaths are even and nonlabored on 4 L nasal cannula ABD: soft/nontender  EXT: No edema  Neuro: Alert and oriented x3   PPS:  50%   This conversation/these recommendations were discussed with patient primary care team, Dr. Thompson Grayer  Time In: 0650 Time Out: 0900 Total Time: 130 Greater than 50%  of this time was spent counseling and coordinating care related to the above assessment and plan.  [facility] Team Team Cell Phone: [phone] Please utilize secure chat with additional questions, if there is no response within 30 minutes please call the above phone number  Palliative Medicine Team providers are available by phone from 7am to 7pm daily and can be reached through the team cell phone.  Should this patient require assistance outside of these hours, please call the patient's attending physician.

## 2021-06-20 NOTE — Progress Notes (Addendum)
FPTS Interim Progress Note  S: Went to evaluate patient started shift.  Found her resting comfortably in her chair.  She denied complaints at this time.  States that she feels her shortness of breath is somewhat improved.  She states she is hopeful that she can get a prosthetic limb as she believes having an easier ability to ambulate will be beneficial.  O: BP (!) 146/60 (BP Location: Left Arm)   Pulse 68   Temp 97.9 F (36.6 C) (Oral)   Resp 17   Ht 5' 3"  (1.6 m)   Wt 90.3 kg   SpO2 97%   BMI 35.26 kg/m   General: Alert and oriented in no apparent distress Heart: Regular rate and rhythm with no murmurs appreciated Lungs: Patient on 4 L nasal cannula with normal respiratory effort, she does have some diminished breath sounds at the left lower base Abdomen: Bowel sounds present, no abdominal pain Skin: Warm and dry   A/P: 60 year old female presenting with shortness of breath thought to be secondary to pleural effusions and CHF.  Cardiology and pulmonology are following.  She has not been a candidate for pleurocentesis and met with palliative care earlier and is now DNR.  There has been some concern for depression and so psychiatry was consulted.  No acute concerns at this time.  We are continue to monitor her hemoglobin as her hemoglobin was 8.4 this morning and she has a threshold of 8.  She is requesting that someone work on a prosthetic limb due to her previous amputation of her right leg.  Unsure the status of this but will make sure the day team is aware.  Remainder per day team progress note.  Lurline Del, DO 06/20/2021, 8:19 PM PGY-3, [facility] Medicine Service pager [phone]

## 2021-06-20 NOTE — Progress Notes (Signed)
Family Medicine Teaching Service Daily Progress Note Intern Pager: [phone]  Patient name: Jamie [name] Medical record number: [id number] Date of birth: [birth year] Age: 60 y.o. Gender: female  Primary Care Provider: Patient, No Pcp Per (Inactive) Consultants: Cardiology, Pulm, Palliative Code Status: DNR  Pt Overview and Major Events to Date:  10/26: admitted 10/27: palliative consult placed  Assessment and Plan:  Jamie [name] is a 60 y.o. female who presented with shortness of breath 2/2 recurrent left pleural effusion and HFpEF. PMH significant for HFpEF, COPD, T2DM, HTN, PAD, GERD, CKD, anxiety, depression, and right BKA.  Acute on Chronic Hypoxemic Respiratory Failure 2/2 Recurrent L Pleural Effusion, HFpEF Doing well from a respiratory standpoint on 4L nasal cannula this morning. Had 1.7L UOP over past 24h (net negative 1.2L since admission) and weight is down 1.3kg. BP and renal function tolerating diuresis quite well. No plans for thoracentesis per pulm. -Palliative consulted, appreciate recommendations -Appreciate excellent care from cardiology and pulmonology -Continue IV Lasix 80mg  BID -Metolazone 5mg  daily -Consider adding Entresto per cards -Strict I/Os, daily weights -Replete K for goal >4  Anxiety  Depression Patient anxious and intermittently tearful throughout hospitalization. This morning endorsing passive SI stating "I want to die" and expressing thoughts of loneliness, hopelessness, and worthlessness. May have a history of bipolar disorder per palliative's conversation w/son. -Consult psychiatry -Continue Buspar 10mg  TID, Klonopin 0.25mg  BID, and Effexor 75mg  daily   Chronic Normocytic Anemia Hgb stable at 7.9 (from 8.0 yesterday). Though her transfusion threshold is <8.0 due to CAD, will hold on transfusion given her volume overload.  -Repeat CBC at 12pm today  HTN BP [id number] systolic over past 40H. -Continue home meds of amlodipine 10mg  daily, labetalol 300mg   BID, clonidine 0.3mg  BID, and Imdur 120mg  daily -Hydralazine 50mg  TID (recently increased from 25mg  TID)  T2DM CBG 184-235 over past 24h. Receiving 10u Semglee and 10u short acting yesterday. -Continue Semglee 10u daily -mSSI -CBG monitoring qAC and qHS  CAD Chronic, stable. -Continue Imdur 120mg  daily -Continue ASA and Plavix for now -Plan to transition from Plavix to Rivaroxaban 2.5mg  BID at some point per cardiology  -Atorvastatin 80mg  daily   FEN/GI: Heart healthy diet PPx: SCDs Dispo: Pending palliative eval  Subjective:  No acute events overnight.  Patient very tearful on exam this morning, reports hopelessnesss, states "I hope I die". Expresses depression due to her current health condition, feels nobody in her family loves her.  From a respiratory standpoint patient feels well. Reports she only gets short of breath when she's upset and worked up.  Objective: Temp:  [97.6 F (36.4 C)-98 F (36.7 C)] 97.6 F (36.4 C) (10/29 0000) Pulse Rate:  [68-73] 68 (10/29 0000) Resp:  [12-20] 14 (10/29 0000) BP: (159-193)/(66-79) 164/73 (10/29 0000) SpO2:  [93 %-97 %] 97 % (10/29 0000) Weight:  [90.3 kg] 90.3 kg (10/29 0100) Physical Exam: General: alert, chronically ill appearing female, sitting in chair Cardiovascular: RRR, normal S1/S2 Respiratory: normal effort on 4L [place], diminished breath sounds at L base Abdomen: soft, nondistended, mild epigastric tenderness Extremities: s/p R BKA Psych: flat affect initially, then tearful and labile during encounter. No tangential speech, does not appear to be responding to internal stimuli  Laboratory: Recent Labs  Lab 06/18/21 0548 06/19/21 0258 06/20/21 0329  WBC 10.5 8.9 9.1  HGB 8.2* 8.0* 7.9*  HCT 24.8* 24.8* 24.5*  PLT 262 229 232   Recent Labs  Lab 06/17/21 1238 06/18/21 0548 06/19/21 0258  NA 136 137 136  K 4.7 4.2  3.7  CL 102 103 102  CO2 22 28 27   BUN 48* 43* 42*  CREATININE 1.72* 1.98* 1.82*  CALCIUM 8.7*  8.6* 8.5*  PROT  --  6.0*  --   BILITOT  --  0.7  --   ALKPHOS  --  72  --   ALT  --  13  --   AST  --  15  --   GLUCOSE 254* 184* 215*     Imaging/Diagnostic Tests: No new results over past 24h.   Alcus Dad, MD 06/20/2021, 6:49 AM PGY-2, [facility] Intern pager: [phone], text pages welcome

## 2021-06-20 NOTE — Progress Notes (Signed)
Went to evaluate the patient after receiving the nurse page that she was experiencing some dyspnea after nurse was assisting her from the chair to the bed.  Found patient lying in bed.  Patient endorsed that she felt short of breath.  She stated that her symptoms had started to improve after sitting in the bed.  Vital signs in the room showed O2 sat of 99% on 4 L of O2 which was the same amount as when I saw her earlier today with normal respiratory rate and heart rate in the upper 60s.  Patient was not endorsing any chest pain or other symptoms.  BP (!) 146/60 (BP Location: Left Arm)   Pulse 68   Temp 97.9 F (36.6 C) (Oral)   Resp 17   Ht 5\' 3"  (1.6 m)   Wt 90.3 kg   SpO2 97%   BMI 35.26 kg/m  General: Alert and oriented in no apparent distress Heart: Regular rate and rhythm with no murmurs appreciated Lungs: Decreased breath sounds on left lower base similar to previous exam.  Patient remains on 4 L nasal cannula with normal respiratory effort.   A/P Patient endorsed a bout of dyspnea after moving from the chair to the bed with nursing assistance.  She denies chest pain or diaphoresis.  No nausea.  Low concern for ACS.  Her vital signs are stable with O2 sat of 99% on her 4 L and no tachypnea.  Most likely cause of patient's dyspnea without hypoxia during ambulation is her CHF and pleural effusion which we are continuing to treat through diuresis.  Explained to the patient that since her symptoms are improving I do not think we need to do anything further.  She will let us know if she has any further bouts of dyspnea but at this time I do not think we need to change any treatment courses.

## 2021-06-20 NOTE — Progress Notes (Signed)
Patient ID: Jamie [name], female   DOB: Jun 02, 1961, 60 y.o.   MRN: [id number]     Advanced Heart Failure Rounding Note  PCP-Cardiologist: None   Subjective:    Admitted w/ a/c hypoxic respiratory failure 2/2 a/c diastolic HF and recurrent left pleural effusion.   Weight down 2 lbs but UOP not particularly vigorous.    BP remains high.  Creatinine stable at 1.74.    She is tearful this morning.    Objective:   Weight Range: 90.3 kg Body mass index is 35.26 kg/m.   Vital Signs:   Temp:  [97.6 F (36.4 C)-98 F (36.7 C)] 97.6 F (36.4 C) (10/29 0000) Pulse Rate:  [68-73] 68 (10/29 0000) Resp:  [14-20] 14 (10/29 0000) BP: (159-169)/(66-76) 164/73 (10/29 0000) SpO2:  [94 %-97 %] 97 % (10/29 0000) Weight:  [90.3 kg] 90.3 kg (10/29 0100) Last BM Date: 06/19/21  Weight change: Filed Weights   06/18/21 1300 06/19/21 0212 06/20/21 0100  Weight: 91.3 kg 91.6 kg 90.3 kg    Intake/Output:   Intake/Output Summary (Last 24 hours) at 06/20/2021 1032 Last data filed at 06/20/2021 0934 Gross per 24 hour  Intake 580 ml  Output 1750 ml  Net -1170 ml      Physical Exam    General: NAD Neck: JVP 14-16 cm, no thyromegaly or thyroid nodule.  Lungs: Decreased left base.  CV: Nondisplaced PMI.  Heart regular S1/S2, no S3/S4, no murmur.  1+ edema left ankle.   Abdomen: Soft, nontender, no hepatosplenomegaly, no distention.  Skin: Intact without lesions or rashes.  Neurologic: Alert and oriented x 3.  Psych: Depressed affect. Extremities: No clubbing or cyanosis. Right BKA.  HEENT: Normal.    Telemetry   NSR 80s, personally reviewed   EKG    No new EKG to review   Labs    CBC Recent Labs    06/17/21 1238 06/18/21 0548 06/19/21 0258 06/20/21 0329  WBC 12.9*   < > 8.9 9.1  NEUTROABS 11.2*  --   --   --   HGB 7.4*   < > 8.0* 7.9*  HCT 24.0*   < > 24.8* 24.5*  MCV 86.6   < > 84.1 83.3  PLT 279   < > 229 232   < > = values in this interval not displayed.   Basic  Metabolic Panel Recent Labs    06/19/21 0258 06/20/21 0329  NA 136 135  K 3.7 3.6  CL 102 101  CO2 27 26  GLUCOSE 215* 226*  BUN 42* 38*  CREATININE 1.82* 1.74*  CALCIUM 8.5* 8.8*  MG 2.2  --    Liver Function Tests Recent Labs    06/18/21 0548  AST 15  ALT 13  ALKPHOS 72  BILITOT 0.7  PROT 6.0*  ALBUMIN 2.6*   No results for input(s): LIPASE, AMYLASE in the last 72 hours. Cardiac Enzymes No results for input(s): CKTOTAL, CKMB, CKMBINDEX, TROPONINI in the last 72 hours.  BNP: BNP (last 3 results) Recent Labs    03/17/21 1145 06/17/21 1850 06/18/21 0548  BNP 261.8* 571.9* 650.6*    ProBNP (last 3 results) No results for input(s): PROBNP in the last 8760 hours.   D-Dimer No results for input(s): DDIMER in the last 72 hours. Hemoglobin A1C Recent Labs    06/17/21 1850  HGBA1C 6.6*   Fasting Lipid Panel No results for input(s): CHOL, HDL, LDLCALC, TRIG, CHOLHDL, LDLDIRECT in the last 72 hours. Thyroid Function Tests No results  for input(s): TSH, T4TOTAL, T3FREE, THYROIDAB in the last 72 hours.  Invalid input(s): FREET3  Other results:   Imaging    No results found.   Medications:     Scheduled Medications:  amLODipine  10 mg Oral Daily   aspirin EC  81 mg Oral Daily   atorvastatin  80 mg Oral QHS   busPIRone  10 mg Oral TID   clonazePAM  0.25 mg Oral BID   cloNIDine  0.3 mg Oral BID   clopidogrel  75 mg Oral Daily   gabapentin  100 mg Oral TID   hydrALAZINE  50 mg Oral TID   insulin aspart  0-15 Units Subcutaneous TID WC   insulin glargine-yfgn  10 Units Subcutaneous Daily   isosorbide mononitrate  120 mg Oral Daily   labetalol  300 mg Oral Q12H   metolazone  5 mg Oral Daily   pantoprazole  40 mg Oral Daily   potassium chloride  40 mEq Oral Once   sacubitril-valsartan  1 tablet Oral BID   senna  2 tablet Oral QHS   spironolactone  12.5 mg Oral Daily   venlafaxine XR  75 mg Oral Q breakfast    Infusions:  sodium chloride      furosemide (LASIX) 200 mg in dextrose 5% 100 mL (2mg /mL) infusion       PRN Medications: nitroGLYCERIN, polyethylene glycol, polyvinyl alcohol   Assessment/Plan   1. Acute on chronic hypoxemic respiratory failure: 09/2019 COVID-19 viral pneumonia. She has been on home oxygen 3L [place] and said to have COPD but has never smoked.  Suspect main issue here is combination of volume overload and recurrent L effusion  - Continue diuresis, has been slow so far.  - Pulmonary recommended not doing thoracentesis (had significant pain in the past with thoracentesis, effusion thought to be due to CHF).  2. Acute/chronic diastolic CHF: Multiple admissions with hypertensive urgency/diastolic CHF. Echo in 2/21 with EF 55-60%, normal RV, IVC suggesting RA pressure 8 mmHg.  [place] 3/21 showed optimized filling pressures with normal cardiac output.  With multiple diastolic CHF admissions, we placed a Cardiomems device 4/22 but later deactivated 03/16/21 due to malfunction of home pillow. Rep notified and will assist so readings can be followed again. Echo 7/22 EF 65%, normal RV. Now admitted w/ a/c CHF.  CXR w/ pulmonary edema + large left pleural effusion.  Diuresis has been slow, still volume overloaded.  - Transition to Lasix gtt 12 mg/hr, give metolazone 5 mg x 1 today.  Replace K.  - Add Entresto 24/26 bid with stable creatinine.  3. Pleural effusions: 09/2019 Had thoracenteses at [facility] and again bilaterally at [facility] in 3/21. Transudates with negative cytology. Suspect due to CHF. Has recurrent L pleural effusion - Evaluated by PCCM, Advised against repeat thoracentesis. Pleurex cath not recommended - Continue diuresis.  4. HTN: BP elevated today Renal artery dopplers in 3/21 with nonobstructive disease. BP still high.  - Continue amlodipine 10 mg daily.  - Continue hydralazine 50 mg tid.  - Continue labetalol 300 mg bid.  - Continue clonidine 0.3 bid.   - Continue Imdur 120 mg daily  - With stable creatinine, add  Entresto 24/26 bid.  5. CKD: Stage 3.  Suspect diabetic nephropathy. Baseline SCr historically ~2.0. 1.74 today.  - monitor w/ diuresis   6. Carotid stenosis: recent doppler 8/22: 1-39% RICA stenosis and 30-86% LICA stenosis  7. Odynophagia: Also with GERD symptoms.  EGD (6/22) with no obvious esophageal mechanical obstruction, likely  benign fundic gland polyps, cannot exclude esophageal dysmotility and gastroparesis. 8. DM: Per [place] 9. CAD: In 3/21, she had atherectomy/PCI to prox/mid LAD and PCI mid LAD. For now, no intervention on moderate LCx disease. Stable w/o CP  - Continue Imdur 120 mg daily.  - Continue ASA 81 + Plavix 75.   - Continue atorvastatin 80 mg daily. Lipids checked (4/22). - Eventually, would transition from Plavix to rivaroxaban 2.5 mg bid given CAD and PAD.  10: PAD: S/p R BKA.  Mildly decreased ABI on left in 8/21.   - Continue statin, ASA, Plavix.  - Repeat peripheral arterial dopplers (5/22) showed mild left lower arterial disease.  11. Depression: Profound.  Per primary team.   She wants to talk to social work about going to a different SNF.   Length of Stay: 2  Loralie Champagne, MD  06/20/2021, 10:32 AM  Advanced Heart Failure Team Pager [phone] (M-F; 7a - 5p)  Please contact [facility] for night-coverage after hours (5p -7a ) and weekends on [email or web address]

## 2021-06-21 ENCOUNTER — Inpatient Hospital Stay: Payer: Self-pay

## 2021-06-21 DIAGNOSIS — F321 Major depressive disorder, single episode, moderate: Secondary | ICD-10-CM | POA: Diagnosis not present

## 2021-06-21 DIAGNOSIS — I5031 Acute diastolic (congestive) heart failure: Secondary | ICD-10-CM | POA: Diagnosis not present

## 2021-06-21 LAB — CBC
HCT: 25.7 % — ABNORMAL LOW (ref 36.0–46.0)
HCT: 27 % — ABNORMAL LOW (ref 36.0–46.0)
Hemoglobin: 8.1 g/dL — ABNORMAL LOW (ref 12.0–15.0)
Hemoglobin: 8.6 g/dL — ABNORMAL LOW (ref 12.0–15.0)
MCH: 26.4 pg (ref 26.0–34.0)
MCH: 26.6 pg (ref 26.0–34.0)
MCHC: 31.5 g/dL (ref 30.0–36.0)
MCHC: 31.9 g/dL (ref 30.0–36.0)
MCV: 83.7 fL (ref 80.0–100.0)
MCV: 83.6 fL (ref 80.0–100.0)
Platelets: 243 10*3/uL (ref 150–400)
Platelets: 250 10*3/uL (ref 150–400)
RBC: 3.07 MIL/uL — ABNORMAL LOW (ref 3.87–5.11)
RBC: 3.23 MIL/uL — ABNORMAL LOW (ref 3.87–5.11)
RDW: 13.9 % (ref 11.5–15.5)
RDW: 13.5 % (ref 11.5–15.5)
WBC: 9.2 10*3/uL (ref 4.0–10.5)
WBC: 8.4 10*3/uL (ref 4.0–10.5)
nRBC: 0 % (ref 0.0–0.2)
nRBC: 0 % (ref 0.0–0.2)

## 2021-06-21 LAB — BASIC METABOLIC PANEL
Anion gap: 10 (ref 5–15)
Anion gap: 7 (ref 5–15)
BUN: 41 mg/dL — ABNORMAL HIGH (ref 6–20)
BUN: 42 mg/dL — ABNORMAL HIGH (ref 6–20)
CO2: 26 mmol/L (ref 22–32)
CO2: 28 mmol/L (ref 22–32)
Calcium: 8.9 mg/dL (ref 8.9–10.3)
Calcium: 8.6 mg/dL — ABNORMAL LOW (ref 8.9–10.3)
Chloride: 98 mmol/L (ref 98–111)
Chloride: 99 mmol/L (ref 98–111)
Creatinine, Ser: 1.88 mg/dL — ABNORMAL HIGH (ref 0.44–1.00)
Creatinine, Ser: 1.82 mg/dL — ABNORMAL HIGH (ref 0.44–1.00)
GFR, Estimated: 30 mL/min — ABNORMAL LOW (ref >60)
GFR, Estimated: 31 mL/min — ABNORMAL LOW (ref >60)
Glucose, Bld: 287 mg/dL — ABNORMAL HIGH (ref 70–99)
Glucose, Bld: 266 mg/dL — ABNORMAL HIGH (ref 70–99)
Potassium: 4.4 mmol/L (ref 3.5–5.1)
Potassium: 4 mmol/L (ref 3.5–5.1)
Sodium: 134 mmol/L — ABNORMAL LOW (ref 135–145)
Sodium: 134 mmol/L — ABNORMAL LOW (ref 135–145)

## 2021-06-21 LAB — COOXEMETRY PANEL
Carboxyhemoglobin: 1.3 % (ref 0.5–1.5)
Methemoglobin: 0.7 % (ref 0.0–1.5)
O2 Saturation: 59.3 %
Total hemoglobin: 8.5 g/dL — ABNORMAL LOW (ref 12.0–16.0)

## 2021-06-21 LAB — GLUCOSE, CAPILLARY
Glucose-Capillary: 271 mg/dL — ABNORMAL HIGH (ref 70–99)
Glucose-Capillary: 444 mg/dL — ABNORMAL HIGH (ref 70–99)
Glucose-Capillary: 257 mg/dL — ABNORMAL HIGH (ref 70–99)
Glucose-Capillary: 267 mg/dL — ABNORMAL HIGH (ref 70–99)

## 2021-06-21 LAB — MAGNESIUM: Magnesium: 2.2 mg/dL (ref 1.7–2.4)

## 2021-06-21 LAB — GLUCOSE, RANDOM: Glucose, Bld: 321 mg/dL — ABNORMAL HIGH (ref 70–99)

## 2021-06-21 MED ORDER — INSULIN GLARGINE-YFGN 100 UNIT/ML ~~LOC~~ SOLN
12.0000 [IU] | Freq: Once | SUBCUTANEOUS | Status: AC
  Administered 2021-06-21: 12 [IU] via SUBCUTANEOUS
  Filled 2021-06-21: qty 0.12

## 2021-06-21 MED ORDER — ENOXAPARIN SODIUM 30 MG/0.3ML IJ SOSY
30.0000 mg | PREFILLED_SYRINGE | INTRAMUSCULAR | Status: DC
  Filled 2021-06-21: qty 0.3

## 2021-06-21 MED ORDER — SODIUM CHLORIDE 0.9% FLUSH
10.0000 mL | Freq: Two times a day (BID) | INTRAVENOUS | Status: DC
  Administered 2021-06-21 – 2021-06-25 (×7): 10 mL

## 2021-06-21 MED ORDER — SODIUM CHLORIDE 0.9% FLUSH: 10.0000 mL | INTRAVENOUS | Status: DC | PRN

## 2021-06-21 MED ORDER — INSULIN GLARGINE-YFGN 100 UNIT/ML ~~LOC~~ SOLN
22.0000 [IU] | Freq: Every day | SUBCUTANEOUS | Status: DC
  Administered 2021-06-22: 22 [IU] via SUBCUTANEOUS
  Filled 2021-06-21: qty 0.22

## 2021-06-21 MED ORDER — ACETAZOLAMIDE 250 MG PO TABS
250.0000 mg | ORAL_TABLET | Freq: Two times a day (BID) | ORAL | Status: DC
  Administered 2021-06-21 (×2): 250 mg via ORAL
  Filled 2021-06-21 (×2): qty 1

## 2021-06-21 MED ORDER — CHLORHEXIDINE GLUCONATE CLOTH 2 % EX PADS
6.0000 | MEDICATED_PAD | Freq: Every day | CUTANEOUS | Status: DC
  Administered 2021-06-21 – 2021-06-25 (×4): 6 via TOPICAL

## 2021-06-21 MED ORDER — INSULIN GLARGINE-YFGN 100 UNIT/ML ~~LOC~~ SOLN: 15.0000 [IU] | Freq: Every day | SUBCUTANEOUS | Status: DC

## 2021-06-21 MED ORDER — HYDRALAZINE HCL 50 MG PO TABS
75.0000 mg | ORAL_TABLET | Freq: Three times a day (TID) | ORAL | Status: DC
  Administered 2021-06-21 – 2021-06-24 (×11): 75 mg via ORAL
  Filled 2021-06-21 (×12): qty 1

## 2021-06-21 NOTE — Plan of Care (Signed)
Patient progressing 

## 2021-06-21 NOTE — Progress Notes (Addendum)
Family Medicine Teaching Service Daily Progress Note Intern Pager: [phone]  Patient name: Jamie [name] Medical record number: [id number] Date of birth: 02/05/1961 Age: 60 y.o. Gender: female  Primary Care Provider: Patient, No Pcp Per (Inactive) Consultants: Pulmonology, Cardiology Code Status: Full  Pt Overview and Major Events to Date:  10/26- Admitted   Assessment and Plan: Jamie [name] is a 60 year old female who presented with shortness of breath in the setting of pleura effusion and CHF exacerbation. PMH is significant for T2DM, HFpEF, COPD, CAD, PAD, HTN, right BKA and on home 3L oxygen  Acute on chronic hypoxemic respiratory failure  Left transudative pleural effusion  exacerbation Patient reported improved dyspnea and had no acute event overnight. On exam she has bilateral crackles with improved LLE edema.  Currently down to 3L [place] which is her baseline. Cardiology is following and recommend stopping lasix, metolazone and acetazolamide, will start PO torsemide 60 mg daily. -Heart failure following, appreciate recs -Pulmonology following appreciate recs -Start Torsemide 60 mg daily -Order 40 mEq K  -Continue daily labs: BMP Q12H, K goal > 4  Hypertension In the last 24 hours ranged from 135-156/ 67-79 -Continue Imdur 120 mg daily -Continue amlodipine 10 mg daily -Continue hydralazine 75 mg 3 times daily -Continue clonidine 0.3 mg twice daily -Continue labetalol 300 mg 3 times daily  Chronic normocytic anemia Hemoglobin today 8.4.  Baseline hemoglobin 8-9 -Transfusion threshold<8 -Continue daily CBC  Type II DM CBG in the last 24 hours ranged from 266 - 314 with most recent CBG of 341. Patient is feeding ,ore ow which most like attributes to her elevated BG. On 36u basal insulin at home will increase semglee to 30u. -Continue moderate SSI -Increase Semglee 30 units daily -CBG Q4H with meals and at bedtime  CKD stage III Baseline creatinine 2.0 and creatinine today is  1.96.  -Continue monitoring renal function -Daily lab: CMP  CAD s/p arthrectomy and PCI Stable.  On home medication of Plavix 75 mg, atorvastatin 80 mg daily and Imdur 120 mg daily. -Continue rivaroxaban 2.5 mg BID -Atorvastatin 80 mg daily -Imdur 20 mg daily   FEN/GI: Carb modified diet PPx: Lovenox 30 mg Dispo:SNF pending clinical improvement . Barriers include clinical status.   Subjective:  Patient slept better last night and no episode of SOB.She denies having any chest pain and feels better over all.  Objective: Temp:  [97.9 F (36.6 C)-98.3 F (36.8 C)] 97.9 F (36.6 C) (10/30 1929) Pulse Rate:  [65-70] 70 (10/30 1929) Resp:  [11-17] 15 (10/30 1929) BP: (135-166)/(60-86) 135/79 (10/30 2130) SpO2:  [94 %-100 %] 98 % (10/30 1929) Weight:  [98.5 kg] 98.5 kg (10/30 0527) Physical Exam: General: Awake, active, NAD Cardiovascular: RRR, no murmurs, normal S1/S2 Respiratory: Diffuse crackles, no wheezing. Good WOB on RA Abdomen: Soft, no distension or tenderness Extremities: +1 LLE edema and well perfused. Right LE BKA  Laboratory: Recent Labs  Lab 06/20/21 1144 06/21/21 0540 06/21/21 1722  WBC 9.3 9.2 8.4  HGB 8.4* 8.1* 8.6*  HCT 26.5* 25.7* 27.0*  PLT 249 243 250   Recent Labs  Lab 06/18/21 0548 06/19/21 0258 06/20/21 0329 06/21/21 0540 06/21/21 1218 06/21/21 1722  NA 137   < > 135 134*  --  134*  K 4.2   < > 3.6 4.4  --  4.0  CL 103   < > 101 98  --  99  CO2 28   < > 26 26  --  28  BUN 43*   < >  38* 41*  --  42*  CREATININE 1.98*   < > 1.74* 1.88*  --  1.82*  CALCIUM 8.6*   < > 8.8* 8.9  --  8.6*  PROT 6.0*  --   --   --   --   --   BILITOT 0.7  --   --   --   --   --   ALKPHOS 72  --   --   --   --   --   ALT 13  --   --   --   --   --   AST 15  --   --   --   --   --   GLUCOSE 184*   < > 226* 287* 321* 266*   < > = values in this interval not displayed.      Imaging/Diagnostic Tests: No imaging  Alen Bleacher, MD 06/21/2021, 10:29  PM PGY-1, [place] Intern pager: [phone], text pages welcome

## 2021-06-21 NOTE — Progress Notes (Signed)
Patient ID: Jamie [name], female   DOB: Feb 06, 1961, 60 y.o.   MRN: [id number]     Advanced Heart Failure Rounding Note  PCP-Cardiologist: None   Subjective:    Admitted w/ a/c hypoxic respiratory failure 2/2 a/c diastolic HF and recurrent left pleural effusion.   Weight does not appear accurate.  I/Os even despite Lasix gtt 12 mg/hr + metolazone 5 mg daily.     BP remains high.  Creatinine fairly stable 1.74 => 1.88.    Mood is improved, not short of breath at rest.    Objective:   Weight Range: 98.5 kg Body mass index is 38.47 kg/m.   Vital Signs:   Temp:  [97.9 F (36.6 C)-98.3 F (36.8 C)] 98.3 F (36.8 C) (10/30 0810) Pulse Rate:  [66-67] 67 (10/30 0810) Resp:  [13-17] 16 (10/30 0810) BP: (146-166)/(60-86) 152/73 (10/30 0810) SpO2:  [94 %-99 %] 97 % (10/30 0810) Weight:  [98.5 kg] 98.5 kg (10/30 0527) Last BM Date: 06/19/21  Weight change: Filed Weights   06/19/21 0212 06/20/21 0100 06/21/21 0527  Weight: 91.6 kg 90.3 kg 98.5 kg    Intake/Output:   Intake/Output Summary (Last 24 hours) at 06/21/2021 1019 Last data filed at 06/21/2021 0810 Gross per 24 hour  Intake 1332.16 ml  Output 1475 ml  Net -142.84 ml      Physical Exam    General: NAD Neck: JVP 14, no thyromegaly or thyroid nodule.  Lungs: Clear to auscultation bilaterally with normal respiratory effort. CV: Nondisplaced PMI.  Heart regular S1/S2, no S3/S4, no murmur.  1+ edema on left.  Abdomen: Soft, nontender, no hepatosplenomegaly, no distention.  Skin: Intact without lesions or rashes.  Neurologic: Alert and oriented x 3.  Psych: Normal affect. Extremities: No clubbing or cyanosis. S/p right BKA.  HEENT: Normal.    Telemetry   NSR 80s, personally reviewed   EKG    No new EKG to review   Labs    CBC Recent Labs    06/20/21 1144 06/21/21 0540  WBC 9.3 9.2  NEUTROABS 7.7  --   HGB 8.4* 8.1*  HCT 26.5* 25.7*  MCV 84.1 83.7  PLT 249 001   Basic Metabolic Panel Recent Labs     06/19/21 0258 06/20/21 0329 06/21/21 0540  NA 136 135 134*  K 3.7 3.6 4.4  CL 102 101 98  CO2 27 26 26   GLUCOSE 215* 226* 287*  BUN 42* 38* 41*  CREATININE 1.82* 1.74* 1.88*  CALCIUM 8.5* 8.8* 8.9  MG 2.2  --  2.2   Liver Function Tests No results for input(s): AST, ALT, ALKPHOS, BILITOT, PROT, ALBUMIN in the last 72 hours.  No results for input(s): LIPASE, AMYLASE in the last 72 hours. Cardiac Enzymes No results for input(s): CKTOTAL, CKMB, CKMBINDEX, TROPONINI in the last 72 hours.  BNP: BNP (last 3 results) Recent Labs    03/17/21 1145 06/17/21 1850 06/18/21 0548  BNP 261.8* 571.9* 650.6*    ProBNP (last 3 results) No results for input(s): PROBNP in the last 8760 hours.   D-Dimer No results for input(s): DDIMER in the last 72 hours. Hemoglobin A1C No results for input(s): HGBA1C in the last 72 hours.  Fasting Lipid Panel No results for input(s): CHOL, HDL, LDLCALC, TRIG, CHOLHDL, LDLDIRECT in the last 72 hours. Thyroid Function Tests No results for input(s): TSH, T4TOTAL, T3FREE, THYROIDAB in the last 72 hours.  Invalid input(s): FREET3  Other results:   Imaging    [place] EKG SITE RITE  Result Date: 06/21/2021 If Site Rite image not attached, placement could not be confirmed due to current cardiac rhythm.    Medications:     Scheduled Medications:  acetaZOLAMIDE  250 mg Oral BID   amLODipine  10 mg Oral Daily   aspirin EC  81 mg Oral Daily   atorvastatin  80 mg Oral QHS   busPIRone  10 mg Oral TID   clonazePAM  0.25 mg Oral BID   cloNIDine  0.3 mg Oral BID   clopidogrel  75 mg Oral Daily   gabapentin  100 mg Oral TID   hydrALAZINE  75 mg Oral TID   insulin aspart  0-15 Units Subcutaneous TID WC   insulin glargine-yfgn  10 Units Subcutaneous Daily   isosorbide mononitrate  120 mg Oral Daily   labetalol  300 mg Oral Q12H   metolazone  5 mg Oral Daily   pantoprazole  40 mg Oral Daily   sacubitril-valsartan  1 tablet Oral BID   senna  2  tablet Oral QHS   spironolactone  12.5 mg Oral Daily   venlafaxine XR  112.5 mg Oral Q breakfast    Infusions:  sodium chloride 10 mL/hr at 06/21/21 0434   furosemide (LASIX) 200 mg in dextrose 5% 100 mL (2mg /mL) infusion 12 mg/hr (06/21/21 0434)     PRN Medications: nitroGLYCERIN, polyethylene glycol, polyvinyl alcohol   Assessment/Plan   1. Acute on chronic hypoxemic respiratory failure: 09/2019 COVID-19 viral pneumonia. She has been on home oxygen 3L [place] and said to have COPD but has never smoked.  Suspect main issue here is combination of volume overload and recurrent L effusion  - Continue diuresis, has been slow so far.  - Pulmonary recommended not doing thoracentesis (had significant pain in the past with thoracentesis, effusion thought to be due to CHF).  2. Acute/chronic diastolic CHF: Multiple admissions with hypertensive urgency/diastolic CHF. Echo in 2/21 with EF 55-60%, normal RV, IVC suggesting RA pressure 8 mmHg.  [place] 3/21 showed optimized filling pressures with normal cardiac output.  With multiple diastolic CHF admissions, we placed a Cardiomems device 4/22 but later deactivated 03/16/21 due to malfunction of home pillow. Rep notified and will assist so readings can be followed again. Echo 7/22 EF 65%, normal RV. Now admitted w/ a/c CHF.  CXR w/ pulmonary edema + large left pleural effusion.  Diuresis has been slow, still volume overloaded.  - Increase Lasix gtt to 15 mg/hr, give metolazone 5 mg again today.   - Add acetazolamide 250 mg bid.  - Place PICC line to follow CVP as exam is somewhat difficult.  - Continue Entresto 24/26 bid and spironolactone 12.5 daily with relatively stable creatinine.  - Possible SGLT2 inhibitor but I am concerned about yeast infection risk in her (not very mobile).  3. Pleural effusions: 09/2019 Had thoracenteses at [facility] and again bilaterally at [facility] in 3/21. Transudates with negative cytology. Suspect due to CHF. Has recurrent L pleural  effusion - Evaluated by PCCM, Advised against repeat thoracentesis. Pleurex cath not recommended - Continue diuresis.  4. HTN: BP elevated today Renal artery dopplers in 3/21 with nonobstructive disease. BP still high.  - Continue amlodipine 10 mg daily.  - Increase hydralazine to 75 mg tid.  - Continue labetalol 300 mg bid.  - Continue clonidine 0.3 bid.   - Continue Imdur 120 mg daily  - Continue Entresto 24/26 bid.  - Continue spironolactone 12.5 daily.  5. CKD: Stage 3.  Suspect diabetic nephropathy. Baseline SCr  historically ~2.0. 1.88 today.  - monitor w/ diuresis   6. Carotid stenosis: recent doppler 8/22: 1-39% RICA stenosis and 01-65% LICA stenosis  7. Odynophagia: Also with GERD symptoms.  EGD (6/22) with no obvious esophageal mechanical obstruction, likely benign fundic gland polyps, cannot exclude esophageal dysmotility and gastroparesis. 8. DM: Per [place] 9. CAD: In 3/21, she had atherectomy/PCI to prox/mid LAD and PCI mid LAD. For now, no intervention on moderate LCx disease. Stable w/o CP  - Continue Imdur 120 mg daily.  - Continue ASA 81 + Plavix 75.   - Continue atorvastatin 80 mg daily. Lipids checked (4/22). - Eventually, would transition from Plavix to rivaroxaban 2.5 mg bid given CAD and PAD.  10: PAD: S/p R BKA.  Mildly decreased ABI on left in 8/21.   - Continue statin, ASA, Plavix.  - Repeat peripheral arterial dopplers (5/22) showed mild left lower arterial disease.  11. Depression: Profound.  Has seen psychiatry, mood better today.   Length of Stay: 3  Loralie Champagne, MD  06/21/2021, 10:19 AM  Advanced Heart Failure Team Pager [phone] (M-F; 7a - 5p)  Please contact [facility] for night-coverage after hours (5p -7a ) and weekends on [email or web address]

## 2021-06-21 NOTE — Progress Notes (Signed)
Peripherally Inserted Central Catheter Placement  The IV Nurse has discussed with the patient and/or persons authorized to consent for the patient, the purpose of this procedure and the potential benefits and risks involved with this procedure.  The benefits include less needle sticks, lab draws from the catheter, and the patient may be discharged home with the catheter. Risks include, but not limited to, infection, bleeding, blood clot (thrombus formation), and puncture of an artery; nerve damage and irregular heartbeat and possibility to perform a PICC exchange if needed/ordered by physician.  Alternatives to this procedure were also discussed.  [place] Power PICC patient education guide, fact sheet on infection prevention and patient information card has been provided to patient /or left at bedside.    PICC Placement Documentation  PICC Double Lumen 06/21/21 PICC Right Brachial 42 cm 0 cm (Active)  Indication for Insertion or Continuance of Line Vasoactive infusions;Chronic illness with exacerbations (CF, Sickle Cell, etc.) 06/21/21 1615  Exposed Catheter (cm) 0 cm 06/21/21 1615  Site Assessment Clean;Dry;Intact 06/21/21 1615  Lumen #1 Status Flushed;Saline locked;Blood return noted 06/21/21 1615  Lumen #2 Status Flushed;Saline locked;Blood return noted 06/21/21 1615  Dressing Type Transparent;Securing device 06/21/21 1615  Dressing Status Clean;Dry;Intact 06/21/21 1615  Antimicrobial disc in place? Yes 06/21/21 1615  Safety Lock Not Applicable 57/84/69 6295  Line Care Connections checked and tightened 06/21/21 1615  Line Adjustment (NICU/IV Team Only) No 06/21/21 1615  Dressing Intervention New dressing 06/21/21 1615  Dressing Change Due 06/28/21 06/21/21 1615       Rolena Infante 06/21/2021, 4:16 PM

## 2021-06-21 NOTE — Progress Notes (Signed)
IVT called and requested clearance from nephrology for PICC line placement given history of CKD 3 and GFR 35.  Spoke with Nephrology, Dr Johnney Ou, regarding placement of PICC for CVP monitoring, ordered by Cardiology.  Ok for PICC placement in brachial vein per Dr Johnney Ou.  Message relayed to PICC team   Carollee Leitz, MD [facility]

## 2021-06-21 NOTE — Progress Notes (Signed)
Family Medicine Teaching Service Daily Progress Note Intern Pager: [phone]  Patient name: Jamie [name] Medical record number: [id number] Date of birth: 03-26-1961 Age: 60 y.o. Gender: female  Primary Care Provider: Patient, No Pcp Per (Inactive) Consultants: Pulmonology, cardiology Code Status: Full  Pt Overview and Major Events to Date:  10/26-admitted  Assessment and Plan:  Jamie [name] is a 60 year old female who presented with shortness of breath 2/2 left transudative pleural effusion and CHF exacerbation.  PMH is significant for HFpEF, T2DM, COPD, PAD, HTN, CAD, right BKA and on home 3L O2   Acute on chronic hypoxemic respiratory failure anemia Left transudative pleural effusion  CHF exacerbation Her last output in the last 24 hours is about 1400.  Overnight patient had an episode of transient dyspnea that occurred with transition from chair to laying down in bed and resolved spontaneously. This morning she has no complaint and endorses feeling better. Per pulmonology patient is not a candidate for thoracocentesis at this time; we will continue diuresis with Lasix daily. Heart failure restarted patient on acetazolamide 250mg  BID and transitioned IV lasix to 200mg  infusion. -Heart failure following, appreciate recs -Pulmonology following, appreciate recs -Continue Lasix 200mg  infusion -Continue daily lab: BMP Q12H with K goal >4   Hypertension BP in the last 24 hours have ranged from 146-166/60-86. Heart failure following and increased hydralazine to 75mg  TID. -Continue Imdur 120 mg daily  -Continue hydralazine 75 mg 3 times daily -Continue amlodipine 10 mg daily -Continue labetalol 300 mg twice daily -Continue clonidine 0.3 mg twice daily  Chronic normocytic anemia Hemoglobin today is 8.1. Baseline hemoglobin is 8-9. -Continue daily CBC -Transfusion threshold <8 - Start Lovenox 30 mg daily for DVT propholaxis  T2DM CBG in the last 24 hours have ranged from 216 -271.  Most  recent CBG is 271. Will increase her basal insulin for better glucose control. -Continue moderate SSI -additional Semglee 12 units daily -CBG Q4H with meals and bedtime  CKD stage III Baseline creatinine 2.0.  Creatinine today is 1.88. Will continue to monitor creatinine while patient is on diuresis with lasix. -Monitor renal function -Daily lab: CMP  CAD s/p atherectomy/PCI Stable.  Transition from Plavix to Rivaroxaban 2.5mg  BID for CAD and PAD.  Home medication includes on the 120 mg daily, aspirin 81 mg daily, Plavix 75 mg daily and atorvastatin 80 mg daily -Continue home medication  Goal of care Patient has displayed progressively worsening symptoms on 10 consults of palliative care medicine who also recommends psych consulted due to patient's complex psychological history.  Psych has seen patient and recommend outpatient therapy Pt interested in prosthetic leg will follow up outpatient.  FEN/GI: Carb Modified diet PPx: Lovenox 30 mg Dispo:SNF pending clinical improvement . Barriers include clinical status.   Subjective:  Jamie [name] says she is doing well this morning and has no complaint at this time.  She reports having a short spell of shortness of breath last night that self resolved, other than that she is doing well and reports feeling much better when she is sitting on the chair.  Objective: Temp:  [97.9 F (36.6 C)-98.2 F (36.8 C)] 98.2 F (36.8 C) (10/30 0400) Pulse Rate:  [66] 66 (10/30 0400) Resp:  [13-17] 13 (10/30 0400) BP: (146-166)/(60-86) 166/86 (10/30 0400) SpO2:  [94 %-99 %] 99 % (10/30 0400) Weight:  [98.5 kg] 98.5 kg (10/30 0527)  Physical Exam: General: Awake, sitting in chair, NAD Cardiovascular: RRR, no murmurs, normal S1/S2 Respiratory: Diffuse crackles, no wheezing.  Good work of  breathing on 4 L nasal cannula Abdomen: Soft, no distention, no tenderness Extremities: +1 left LE edema.  Right BKA. Psych: Good and pleasant affect.  Laboratory: Recent  Labs  Lab 06/20/21 0329 06/20/21 1144 06/21/21 0540  WBC 9.1 9.3 9.2  HGB 7.9* 8.4* 8.1*  HCT 24.5* 26.5* 25.7*  PLT 232 249 243   Recent Labs  Lab 06/18/21 0548 06/19/21 0258 06/20/21 0329  NA 137 136 135  K 4.2 3.7 3.6  CL 103 102 101  CO2 28 27 26   BUN 43* 42* 38*  CREATININE 1.98* 1.82* 1.74*  CALCIUM 8.6* 8.5* 8.8*  PROT 6.0*  --   --   BILITOT 0.7  --   --   ALKPHOS 72  --   --   ALT 13  --   --   AST 15  --   --   GLUCOSE 184* 215* 226*      Imaging/Diagnostic Tests: No new imaging  Alen Bleacher, MD 06/21/2021, 7:31 AM PGY-1, [place] Intern pager: [phone], text pages welcome

## 2021-06-21 NOTE — Progress Notes (Signed)
Palliative Medicine Inpatient Follow Up Note  Consulting Provider: Spero Geralds, MD   Reason for consult:   [facility] Palliative Medicine Consult  Reason for Consult? heart failure, recurrent admissions    HPI:  Per intake H&P --> Jamie [name] is a 60 y.o. female presenting with shortness of breath secondary to left side pleural effusion. PMH is significant for recurrent pleural effusions, CHF, COPD, type 2 diabetes, HTN, PAD, right BKA, CKD, GERD.   Palliative care saw Jamie [name] in March of 2021 at that time her goals were full scope of care.   Palliative care was consulted inpatient to further aid in goals of care discussions in the setting of recurrent admissions.  Today's Discussion (06/21/2021):  *Please note that this is a verbal dictation therefore any spelling or grammatical errors are due to the "[place] [employer]" system interpretation.  Chart reviewed.  I met with Jamie [name] this morning she is in better spirits.  She shares that she feels relieved that her many problems are actually being addressed.  She expresses willingness to sit on the side of the bed to eat breakfast.  She was able to move to the side of the bed on her own.  We reviewed the importance of her getting up today and participating in mobility efforts.  Denies chest pain or difficulty breathing.   Jamie [name] and I talked about her son and daughter-in-law.  She shared with me that she wishes she could see them more often [place] and could spend time with her grandchildren more often only.  I offered her time to express her feelings and provided emotional support through validation.  Jamie [name] is thankful for the medical team and palliative care team for being involved in trying to "help her".  I shared that we will continue to be involved until she transitions to her facility.  I shared that we will remain to have someone involved even after she leaves the hospital through outpatient palliative  care.  Questions and concerns were answered.  Palliative support was provided.  Objective Assessment: Vital Signs Vitals:   06/21/21 0400 06/21/21 0810  BP: (!) 166/86 (!) 152/73  Pulse: 66 67  Resp: 13 16  Temp: 98.2 F (36.8 C) 98.3 F (36.8 C)  SpO2: 99% 97%    Intake/Output Summary (Last 24 hours) at 06/21/2021 0947 Last data filed at 06/21/2021 2423 Gross per 24 hour  Intake 1332.16 ml  Output 1475 ml  Net -142.84 ml   Last Weight  Most recent update: 06/21/2021  5:28 AM    Weight  98.5 kg (217 lb 2.5 oz)            Gen: Older female in no acute distress this morning HEENT: moist mucous membranes CV: Regular rate and rhythm  PULM: Breaths are even and nonlabored on 4 L nasal cannula ABD: soft/nontender  EXT: No edema  Neuro: Alert and oriented x3   SUMMARY OF RECOMMENDATIONS   DNAR/DNI   MOST/DNR in [facility]    Patient is welcoming of spiritual visits   OP Palliative care recommended   Ongoing support  Time Spent: 35 Greater than 50% of the time was spent in counseling and coordination of care ______________________________________________________________________________________ [place] Team Team Cell Phone: [phone] Please utilize secure chat with additional questions, if there is no response within 30 minutes please call the above phone number  Palliative Medicine Team providers are available by phone from 7am to 7pm daily and can  be reached through the team cell phone.  Should this patient require assistance outside of these hours, please call the patient's attending physician.     

## 2021-06-21 NOTE — Progress Notes (Signed)
Spoke with Dr Adah Salvage re PICC order, hx of CKD3 and followed by nephrology outpatient.  Dr Adah Salvage to consult with nephrology for approval for PICC placement.

## 2021-06-22 ENCOUNTER — Other Ambulatory Visit (HOSPITAL_COMMUNITY): Payer: Self-pay

## 2021-06-22 DIAGNOSIS — I5031 Acute diastolic (congestive) heart failure: Secondary | ICD-10-CM | POA: Diagnosis not present

## 2021-06-22 DIAGNOSIS — F321 Major depressive disorder, single episode, moderate: Secondary | ICD-10-CM | POA: Diagnosis not present

## 2021-06-22 LAB — BASIC METABOLIC PANEL
Anion gap: 9 (ref 5–15)
BUN: 46 mg/dL — ABNORMAL HIGH (ref 6–20)
CO2: 30 mmol/L (ref 22–32)
Calcium: 8.9 mg/dL (ref 8.9–10.3)
Chloride: 96 mmol/L — ABNORMAL LOW (ref 98–111)
Creatinine, Ser: 2.03 mg/dL — ABNORMAL HIGH (ref 0.44–1.00)
GFR, Estimated: 28 mL/min — ABNORMAL LOW (ref >60)
Glucose, Bld: 298 mg/dL — ABNORMAL HIGH (ref 70–99)
Potassium: 4 mmol/L (ref 3.5–5.1)
Sodium: 135 mmol/L (ref 135–145)

## 2021-06-22 LAB — COMPREHENSIVE METABOLIC PANEL
ALT: 14 U/L (ref 0–44)
AST: 15 U/L (ref 15–41)
Albumin: 2.5 g/dL — ABNORMAL LOW (ref 3.5–5.0)
Alkaline Phosphatase: 70 U/L (ref 38–126)
Anion gap: 7 (ref 5–15)
BUN: 43 mg/dL — ABNORMAL HIGH (ref 6–20)
CO2: 30 mmol/L (ref 22–32)
Calcium: 8.7 mg/dL — ABNORMAL LOW (ref 8.9–10.3)
Chloride: 95 mmol/L — ABNORMAL LOW (ref 98–111)
Creatinine, Ser: 1.96 mg/dL — ABNORMAL HIGH (ref 0.44–1.00)
GFR, Estimated: 29 mL/min — ABNORMAL LOW (ref >60)
Glucose, Bld: 341 mg/dL — ABNORMAL HIGH (ref 70–99)
Potassium: 3.9 mmol/L (ref 3.5–5.1)
Sodium: 132 mmol/L — ABNORMAL LOW (ref 135–145)
Total Bilirubin: 0.5 mg/dL (ref 0.3–1.2)
Total Protein: 5.8 g/dL — ABNORMAL LOW (ref 6.5–8.1)

## 2021-06-22 LAB — GLUCOSE, CAPILLARY
Glucose-Capillary: 304 mg/dL — ABNORMAL HIGH (ref 70–99)
Glucose-Capillary: 362 mg/dL — ABNORMAL HIGH (ref 70–99)
Glucose-Capillary: 317 mg/dL — ABNORMAL HIGH (ref 70–99)
Glucose-Capillary: 255 mg/dL — ABNORMAL HIGH (ref 70–99)

## 2021-06-22 LAB — CBC
HCT: 25.9 % — ABNORMAL LOW (ref 36.0–46.0)
Hemoglobin: 8.4 g/dL — ABNORMAL LOW (ref 12.0–15.0)
MCH: 26.9 pg (ref 26.0–34.0)
MCHC: 32.4 g/dL (ref 30.0–36.0)
MCV: 83 fL (ref 80.0–100.0)
Platelets: 215 10*3/uL (ref 150–400)
RBC: 3.12 MIL/uL — ABNORMAL LOW (ref 3.87–5.11)
RDW: 13.7 % (ref 11.5–15.5)
WBC: 8.9 10*3/uL (ref 4.0–10.5)
nRBC: 0 % (ref 0.0–0.2)

## 2021-06-22 LAB — CULTURE, BLOOD (ROUTINE X 2)
Culture: NO GROWTH
Culture: NO GROWTH
Special Requests: ADEQUATE
Special Requests: ADEQUATE

## 2021-06-22 MED ORDER — ENOXAPARIN SODIUM 40 MG/0.4ML IJ SOSY
40.0000 mg | PREFILLED_SYRINGE | INTRAMUSCULAR | Status: DC
  Filled 2021-06-22: qty 0.4

## 2021-06-22 MED ORDER — TORSEMIDE 20 MG PO TABS
60.0000 mg | ORAL_TABLET | Freq: Every day | ORAL | Status: DC
  Administered 2021-06-22: 60 mg via ORAL
  Filled 2021-06-22: qty 3

## 2021-06-22 MED ORDER — INSULIN GLARGINE-YFGN 100 UNIT/ML ~~LOC~~ SOLN
8.0000 [IU] | Freq: Once | SUBCUTANEOUS | Status: AC
  Administered 2021-06-22: 8 [IU] via SUBCUTANEOUS
  Filled 2021-06-22: qty 0.08

## 2021-06-22 MED ORDER — POTASSIUM CHLORIDE CRYS ER 20 MEQ PO TBCR
40.0000 meq | EXTENDED_RELEASE_TABLET | Freq: Once | ORAL | Status: AC
  Administered 2021-06-22: 40 meq via ORAL
  Filled 2021-06-22: qty 2

## 2021-06-22 MED ORDER — INSULIN GLARGINE-YFGN 100 UNIT/ML ~~LOC~~ SOLN
30.0000 [IU] | Freq: Every day | SUBCUTANEOUS | Status: DC
  Administered 2021-06-23: 30 [IU] via SUBCUTANEOUS
  Filled 2021-06-22: qty 0.3

## 2021-06-22 MED ORDER — DAPAGLIFLOZIN PROPANEDIOL 10 MG PO TABS
10.0000 mg | ORAL_TABLET | Freq: Every day | ORAL | Status: DC
  Administered 2021-06-22 – 2021-06-25 (×4): 10 mg via ORAL
  Filled 2021-06-22 (×4): qty 1

## 2021-06-22 NOTE — TOC Progression Note (Signed)
Transition of Care [facility]) - Progression Note    Patient Details  Name: Jamie [name] MRN: [id number] Date of Birth: 06/11/1961  Transition of Care [facility]) CM/SW [place], [place] Phone Number: 06/22/2021, 11:07 AM  Clinical Narrative:    HF CSW spoke to Jamie [name] at bedside per request of RN. Jamie [name] reported that she doesn't want to go back to [facility] and began to cry. HF CSW asked why and Jamie [name] said she doesn't like it there and if she can go to another facility. CSW informed Jamie [name] that patient's go back to the facility they came from and will find out what the recommendations are from PT. Jamie [name] did not specify what the concern is with [facility] and CSW asked Jamie [name] about reaching out to family and Jamie [name] declined.  CSW will continue to follow throughout discharge.        Expected Discharge Plan and Services                                                 Social Determinants of Health (SDOH) Interventions    Readmission Risk Interventions No flowsheet data found.  Terri Malerba, MSW, [place] Heart Failure Social Worker

## 2021-06-22 NOTE — TOC Benefit Eligibility Note (Signed)
Patient Teacher, English as a foreign language completed.    The patient is currently admitted and upon discharge could be taking Farxiga 10 mg.  The current 30 day co-pay is, $0.00.   The patient is insured through [place], [place] Patient Advocate Specialist [place] Patient Advocate Team Direct Number: [phone]  Fax: [phone]

## 2021-06-22 NOTE — Progress Notes (Signed)
Physical Therapy Treatment Patient Details Name: Jamie [name] MRN: [id number] DOB: 05-17-1961 Today's Date: 06/22/2021   History of Present Illness Pt is a 60 y/o female admitted on 10/26 with shortness of breath due to L sided pleural effusion.  PMH is significant for recurrent pleural effusions, CHF, COPD, type 2 diabetes, HTN, PAD, right BKA, CKD.Pt is a 60 y/o female admitted on 10/26 with shortness of breath due to L sided pleural effusion.  PMH is significant for recurrent pleural effusions, CHF, COPD, type 2 diabetes, HTN, PAD, right BKA, CKD.    PT Comments    Pt agreeable to therapy and reports wanting to walk again. Pt reports that she is very unhappy with the [facility], reporting that she has not been having therapy even though when she went there initially they were supposed to help her get a new prosthetic and walk again. Pt limited in safe mobility by requirement of 3L O2 via [place], in presence of generalized weakness and decreased endurance. Pt is currently min guard for bed mobility and min A for squat pivot transfer to recliner. Pt requesting different SNF at discharge. PT reported that normally pts are required to return to SNF that they came from, suggested continued discussion with [facility] about how to change. PT recommending additional PT when she gets to a SNF to progress her mobility with possibility to be fitted for prosthetic even just to assist with safe transfers to improve her independence. PT will continue to follow acutely.     Recommendations for follow up therapy are one component of a multi-disciplinary discharge planning process, led by the attending physician.  Recommendations may be updated based on patient status, additional functional criteria and insurance authorization.  Follow Up Recommendations  Skilled nursing-short term rehab (<3 hours/day)     Assistance Recommended at Discharge Intermittent Supervision/Assistance  Equipment Recommendations  None  recommended by PT;Other (comment) (would benefit from new prosthetic)       Precautions / Restrictions Precautions Precautions: Fall Restrictions Weight Bearing Restrictions: Yes RLE Weight Bearing:  (R BKA)     Mobility  Bed Mobility Overal bed mobility: Needs Assistance Bed Mobility: Supine to Sit     Supine to sit: Min guard     General bed mobility comments: min guard for coming to EoB with use of bedrail    Transfers Overall transfer level: Needs assistance   Transfers: Squat Pivot Transfers     Squat pivot transfers: From elevated surface;Min assist       General transfer comment: min A for stability with squat pivot transfer from bed to recliner    Ambulation/Gait             General Gait Details: unable         Balance Overall balance assessment: Needs assistance   Sitting balance-Leahy Scale: Fair Sitting balance - Comments: able to sit EOB without UE assist and maintain balance                                    Cognition Arousal/Alertness: Awake/alert Behavior During Therapy: Flat affect (upset about possible return to [facility]) Overall Cognitive Status: Within Functional Limits for tasks assessed                                 General Comments: Pt very upset with current SNF facility, reporting  that she was supposed to go there for Rehab and assistance in getting a prosthetic that fits however they have not been able to assist with that           General Comments General comments (skin integrity, edema, etc.): VSS on 3L O2 via [place]      Pertinent Vitals/Pain Pain Assessment: No/denies pain     PT Goals (current goals can now be found in the care plan section) Acute Rehab PT Goals Patient Stated Goal: I want to get my leg. PT Goal Formulation: With patient Time For Goal Achievement: 07/02/21 Potential to Achieve Goals: Fair Progress towards PT goals: Progressing toward goals    Frequency     Min 2X/week      PT Plan Current plan remains appropriate    Co-evaluation              AM-PAC PT "6 Clicks" Mobility   Outcome Measure  Help needed turning from your back to your side while in a flat bed without using bedrails?: A Little Help needed moving from lying on your back to sitting on the side of a flat bed without using bedrails?: A Little Help needed moving to and from a bed to a chair (including a wheelchair)?: A Little Help needed standing up from a chair using your arms (e.g., wheelchair or bedside chair)?: A Little Help needed to walk in hospital room?: Total Help needed climbing 3-5 steps with a railing? : Total 6 Click Score: 14    End of Session Equipment Utilized During Treatment: Oxygen Activity Tolerance: Patient tolerated treatment well;Patient limited by fatigue Patient left: with call bell/phone within reach;in chair;with chair alarm set;with nursing/sitter in room Nurse Communication: Mobility status PT Visit Diagnosis: Other abnormalities of gait and mobility (R26.89);Muscle weakness (generalized) (M62.81)     Time: 7858-8502 PT Time Calculation (min) (ACUTE ONLY): 26 min  Charges:  $Therapeutic Activity: 23-37 mins                     Allin Frix B. Migdalia Dk PT, DPT Acute Rehabilitation Services Pager [phone] Office [phone]    Page 06/22/2021, 5:16 PM

## 2021-06-22 NOTE — Hospital Course (Addendum)
Jamie [name] is a 60 y.o. female presenting with shortness of breath secondary to left side pleural effusion and CHF exacerbation. PMH is significant for recurrent pleural effusions, CHF, COPD, type 2 diabetes, HTN, PAD, right BKA, CKD, GERD.  Acute on Chronic Hypoxic respiratory failure Patient presented to the ED with complaint of dyspnea, chest pain and severe fatigue for the past few. Per EMS she was hypoxic with increased oxygen requirement more than her base line of 3L, She stabilized on 15 L [place]. CXR in the ED showed moderate to large pleura effusion with atelectasis. Initial labs showed mild leucocytosis with WBC of 12.4, troponin 17 and normal lactic acid.  On exam she had diffused crackles, +2  Left LE edema and mild JVD. Pulmonology was consulted for thoracocentesis who deemed patient a poor candidate considering she underwent the procedure at her last hospitalization. Patient was diuresed with IV lasix 80 mg BID and received antibiotics of CTX and azithromycin which was eventually discontinued after ruling out pneumonia. At end of hospital stay was back to baseline 3L O2 and breathing comfortably. Was stable from HF standpoint at discharge on torsemide. CVPs were monitored throughout hospital stay. Was net diuresed 4.3 L.  HTN Continue amlodipine 10 mg daily, Continued hydralazine 75 mg tid, Continued labetalol 300 mg bid, Continue clonidine 0.3 bid,Continue Imdur 120 mg daily, Continue Entresto 24/26 bid, Continued spironolactone 12.5 daily.  T2DM Controlled with sliding scale insulin in hospital. Home 28 U BID A1c 6.6.   AKI on CKD Stage III Baseline around 2 for creatinine. Continued diuresis as likely due to volume overload. Held nephrotoxic agents. Creatinine at end of hospitalization 2.04  Chronic Normocytic Anemia Baseline around 8-9 likely due to anemia of chronic disease. Remained stable.  CAD s/p arth Hospitalization from 10/20/2020-11/06/2019 pt had cardiac catheterization  significant for three-vessel disease.  She had coronary arthrectomy and PCI to right coronary artery and PCI to mid LAD. Medications continued were aspirin 81 mg daily, atorvastatin 80 mg at bedtime, Plavix 75 mg daily, Imdur 90 mg daily and Nitrostat 0.4 mg as needed.  Anxiety/Depression Psychiatry saw patient in hospital. Buspar continued, Effexor-XR 75 MG and add Effexor XR 37.5 mg daily for depression.   Constipation, resolved Mild, resolved at end of hospitalization  Plan Per HF continue 40 mg torsemide daily Needs to restart Cardiomems device again and follow up CHF clinic Per psychiatry patient would benefit from counseling and outpatient psychiatric referral upon discharge Will need nephrology

## 2021-06-22 NOTE — Progress Notes (Signed)
Inpatient Diabetes Program Recommendations  AACE/ADA: New Consensus Statement on Inpatient Glycemic Control ) Target Ranges:  Prepandial:   less than 140 mg/dL      Peak postprandial:   less than 180 mg/dL (1-2 hours)      Critically ill patients:  140 - 180 mg/dL   Results for Jamie [name], Jamie [name] (MRN [id number]) as of 06/22/2021 11:13  Ref. Range 06/21/2021 06:17 06/21/2021 11:56 06/21/2021 16:40 06/21/2021 21:26 06/22/2021 06:04  Glucose-Capillary Latest Ref Range: 70 - 99 mg/dL 271 (H) 444 (H) 257 (H) 267 (H) 304 (H)    Review of Glycemic Control  Diabetes history: DM2 Outpatient Diabetes medications: Levemir 28 units BID, Novolog 2-10 units QID Current orders for Inpatient glycemic control: Semglee 22 units daily, Novolog 0-15 units TID with meals, Farxiga 10 mg daily  Inpatient Diabetes Program Recommendations:    Insulin: Noted Wilder Glade started today. If glucose remains consistently over 180 mg/dl, please consider increasing Semglee to 25 units daily and consider ordering Novolog 4 units TID with meals for meal coverage if patient eats at least 50% of meals.  Thanks, Barnie Alderman, RN, MSN, CDE Diabetes Coordinator Inpatient Diabetes Program [phone] (Team Pager from 8am to 5pm)

## 2021-06-22 NOTE — Progress Notes (Signed)
FPTS Interim Progress Note  S: Patient seen and evaluated at bedside for nighttime rounds. She was sleeping comfortably. I did not wake the patient.  O: BP (!) 153/66 (BP Location: Left Arm)   Pulse 67   Temp 98.2 F (36.8 C) (Oral)   Resp 17   Ht 5\' 3"  (1.6 m)   Wt 98.5 kg   SpO2 99%   BMI 38.47 kg/m   General: Sleeping comfortably Resp: Normal chest rise and fall, on 3L [place] in no distress  A/P: Jamie [name] is a 60 year old female with acute on chronic hypoxemic respiratory failure secondary to left transudative pleural effusion. - Continue management per day team - BMP q12hours - Will monitor respiratory status closely.  Labs and orders reviewed. No new changes.  Orvis Brill, DO 06/22/2021, 1:12 AM PGY-1, [place] Medicine Service pager [phone]

## 2021-06-22 NOTE — Progress Notes (Signed)
Patient ID: Jamie [name], female   DOB: [birth year], 60 y.o.   MRN: [id number]     Advanced Heart Failure Rounding Note  PCP-Cardiologist: None   Subjective:    Admitted w/ a/c hypoxic respiratory failure 2/2 a/c diastolic HF and recurrent left pleural effusion.   Weight down, I/Os negative.  CVP 6 today.   BP remains high but now under better control.  Creatinine fairly stable 1.74 => 1.88 => 1.96.    Mood is improved, not short of breath at rest. She is on 3L [place] which is her baseline.    Objective:   Weight Range: 90 kg Body mass index is 35.15 kg/m.   Vital Signs:   Temp:  [97.9 F (36.6 C)-98.3 F (36.8 C)] 98.2 F (36.8 C) (10/31 0400) Pulse Rate:  [65-70] 68 (10/31 0400) Resp:  [11-17] 17 (10/30 2346) BP: (135-156)/(60-79) 142/69 (10/31 0400) SpO2:  [98 %-100 %] 100 % (10/31 0400) Weight:  [90 kg] 90 kg (10/31 0605) Last BM Date: 06/19/21  Weight change: Filed Weights   06/20/21 0100 06/21/21 0527 06/22/21 0605  Weight: 90.3 kg 98.5 kg 90 kg    Intake/Output:   Intake/Output Summary (Last 24 hours) at 06/22/2021 0833 Last data filed at 06/22/2021 0605 Gross per 24 hour  Intake 440 ml  Output 1950 ml  Net -1510 ml      Physical Exam    General: NAD Neck: No JVD, no thyromegaly or thyroid nodule.  Lungs: Decreased left base.  CV: Nondisplaced PMI.  Heart regular S1/S2, no S3/S4, no murmur.  No peripheral edema.   Abdomen: Soft, nontender, no hepatosplenomegaly, no distention.  Skin: Intact without lesions or rashes.  Neurologic: Alert and oriented x 3.  Psych: Normal affect. Extremities: No clubbing or cyanosis. S/p right BKA.  HEENT: Normal.    Telemetry   NSR 80s, personally reviewed   EKG    No new EKG to review   Labs    CBC Recent Labs    06/20/21 1144 06/21/21 0540 06/21/21 1722 06/22/21 0457  WBC 9.3   < > 8.4 8.9  NEUTROABS 7.7  --   --   --   HGB 8.4*   < > 8.6* 8.4*  HCT 26.5*   < > 27.0* 25.9*  MCV 84.1   < > 83.6 83.0   PLT 249   < > 250 215   < > = values in this interval not displayed.   Basic Metabolic Panel Recent Labs    06/21/21 0540 06/21/21 1218 06/21/21 1722 06/22/21 0457  NA 134*  --  134* 132*  K 4.4  --  4.0 3.9  CL 98  --  99 95*  CO2 26  --  28 30  GLUCOSE 287*   < > 266* 341*  BUN 41*  --  42* 43*  CREATININE 1.88*  --  1.82* 1.96*  CALCIUM 8.9  --  8.6* 8.7*  MG 2.2  --   --   --    < > = values in this interval not displayed.   Liver Function Tests Recent Labs    06/22/21 0457  AST 15  ALT 14  ALKPHOS 70  BILITOT 0.5  PROT 5.8*  ALBUMIN 2.5*    No results for input(s): LIPASE, AMYLASE in the last 72 hours. Cardiac Enzymes No results for input(s): CKTOTAL, CKMB, CKMBINDEX, TROPONINI in the last 72 hours.  BNP: BNP (last 3 results) Recent Labs    03/17/21 1145 06/17/21 1850  06/18/21 0548  BNP 261.8* 571.9* 650.6*    ProBNP (last 3 results) No results for input(s): PROBNP in the last 8760 hours.   D-Dimer No results for input(s): DDIMER in the last 72 hours. Hemoglobin A1C No results for input(s): HGBA1C in the last 72 hours.  Fasting Lipid Panel No results for input(s): CHOL, HDL, LDLCALC, TRIG, CHOLHDL, LDLDIRECT in the last 72 hours. Thyroid Function Tests No results for input(s): TSH, T4TOTAL, T3FREE, THYROIDAB in the last 72 hours.  Invalid input(s): FREET3  Other results:   Imaging    [place] EKG SITE RITE  Result Date: 06/21/2021 If Site Rite image not attached, placement could not be confirmed due to current cardiac rhythm.    Medications:     Scheduled Medications:  amLODipine  10 mg Oral Daily   aspirin EC  81 mg Oral Daily   atorvastatin  80 mg Oral QHS   busPIRone  10 mg Oral TID   Chlorhexidine Gluconate Cloth  6 each Topical Daily   clonazePAM  0.25 mg Oral BID   cloNIDine  0.3 mg Oral BID   clopidogrel  75 mg Oral Daily   dapagliflozin propanediol  10 mg Oral Daily   enoxaparin (LOVENOX) injection  30 mg Subcutaneous  Q24H   gabapentin  100 mg Oral TID   hydrALAZINE  75 mg Oral TID   insulin aspart  0-15 Units Subcutaneous TID WC   insulin glargine-yfgn  22 Units Subcutaneous Daily   isosorbide mononitrate  120 mg Oral Daily   labetalol  300 mg Oral Q12H   pantoprazole  40 mg Oral Daily   sacubitril-valsartan  1 tablet Oral BID   senna  2 tablet Oral QHS   sodium chloride flush  10-40 mL Intracatheter Q12H   spironolactone  12.5 mg Oral Daily   torsemide  60 mg Oral Daily   venlafaxine XR  112.5 mg Oral Q breakfast    Infusions:  sodium chloride 10 mL/hr at 06/21/21 0434     PRN Medications: nitroGLYCERIN, polyethylene glycol, polyvinyl alcohol, sodium chloride flush   Assessment/Plan   1. Acute on chronic hypoxemic respiratory failure: 09/2019 COVID-19 viral pneumonia. She has been on home oxygen 3L [place] and said to have COPD but has never smoked.  Suspect main issue here is combination of volume overload and recurrent L effusion.  CVP down to 6, oxygen saturation down to baseline (3L [place]).  - Pulmonary recommended not doing thoracentesis (had significant pain in the past with thoracentesis, effusion thought to be due to CHF).  2. Acute/chronic diastolic CHF: Multiple admissions with hypertensive urgency/diastolic CHF. Echo in 2/21 with EF 55-60%, normal RV, IVC suggesting RA pressure 8 mmHg.  [place] 3/21 showed optimized filling pressures with normal cardiac output.  With multiple diastolic CHF admissions, we placed a Cardiomems device 4/22 but later deactivated 03/16/21 due to malfunction of home pillow. Rep notified and will assist so readings can be followed again. Echo 7/22 EF 65%, normal RV. Now admitted w/ a/c CHF.  CXR w/ pulmonary edema + large left pleural effusion.  Diuresis has been slow, but CVP now down to 6 and patient feeling better.  - Stop Lasix, metolazone, and acetazolamide.  - Can start back on higher dose of torsemide, 60 mg daily.  - Continue Entresto 24/26 bid and spironolactone  12.5 daily with relatively stable creatinine.  - Add Farxiga 10 mg daily.  She does not get frequent UTIs/yeast infections.  3. Pleural effusions: 09/2019 Had thoracenteses at [facility]  and again bilaterally at [facility] in 3/21. Transudates with negative cytology. Suspect due to CHF. Has recurrent L pleural effusion - Evaluated by PCCM, Advised against repeat thoracentesis. Pleurex cath not recommended 4. HTN: Renal artery dopplers in 3/21 with nonobstructive disease. BP still high but improving.  - Continue amlodipine 10 mg daily.  - Continue hydralazine 75 mg tid.  - Continue labetalol 300 mg bid.  - Continue clonidine 0.3 bid.   - Continue Imdur 120 mg daily  - Continue Entresto 24/26 bid.  - Continue spironolactone 12.5 daily.  5. CKD: Stage 3.  Suspect diabetic nephropathy. Baseline SCr historically ~2.0. 1.96 today.  - As above, stop IV diuresis.  6. Carotid stenosis: recent doppler 8/22: 1-39% RICA stenosis and 93-90% LICA stenosis  7. Odynophagia: Also with GERD symptoms.  EGD (6/22) with no obvious esophageal mechanical obstruction, likely benign fundic gland polyps, cannot exclude esophageal dysmotility and gastroparesis. 8. DM: Per [place] 9. CAD: In 3/21, she had atherectomy/PCI to prox/mid LAD and PCI mid LAD. For now, no intervention on moderate LCx disease. Stable w/o CP  - Continue Imdur 120 mg daily.  - Continue ASA 81 + Plavix 75.   - Continue atorvastatin 80 mg daily. Lipids checked (4/22). - Eventually, would transition from Plavix to rivaroxaban 2.5 mg bid given CAD and PAD.  10: PAD: S/p R BKA.  Mildly decreased ABI on left in 8/21.   - Continue statin, ASA, Plavix.  - Repeat peripheral arterial dopplers (5/22) showed mild left lower arterial disease.  11. Depression: Profound.  Has seen psychiatry, mood better today.   Length of Stay: 4  Loralie Champagne, MD  06/22/2021, 8:33 AM  Advanced Heart Failure Team Pager [phone] (M-F; 7a - 5p)  Please contact [facility] for  night-coverage after hours (5p -7a ) and weekends on [email or web address]

## 2021-06-23 ENCOUNTER — Other Ambulatory Visit (HOSPITAL_COMMUNITY): Payer: Self-pay

## 2021-06-23 DIAGNOSIS — F321 Major depressive disorder, single episode, moderate: Secondary | ICD-10-CM | POA: Diagnosis not present

## 2021-06-23 DIAGNOSIS — I5031 Acute diastolic (congestive) heart failure: Secondary | ICD-10-CM | POA: Diagnosis not present

## 2021-06-23 LAB — BASIC METABOLIC PANEL
Anion gap: 10 (ref 5–15)
Anion gap: 9 (ref 5–15)
BUN: 48 mg/dL — ABNORMAL HIGH (ref 6–20)
BUN: 44 mg/dL — ABNORMAL HIGH (ref 6–20)
CO2: 30 mmol/L (ref 22–32)
CO2: 29 mmol/L (ref 22–32)
Calcium: 8.7 mg/dL — ABNORMAL LOW (ref 8.9–10.3)
Calcium: 8.5 mg/dL — ABNORMAL LOW (ref 8.9–10.3)
Chloride: 96 mmol/L — ABNORMAL LOW (ref 98–111)
Chloride: 98 mmol/L (ref 98–111)
Creatinine, Ser: 2.19 mg/dL — ABNORMAL HIGH (ref 0.44–1.00)
Creatinine, Ser: 2.08 mg/dL — ABNORMAL HIGH (ref 0.44–1.00)
GFR, Estimated: 25 mL/min — ABNORMAL LOW (ref >60)
GFR, Estimated: 27 mL/min — ABNORMAL LOW (ref >60)
Glucose, Bld: 288 mg/dL — ABNORMAL HIGH (ref 70–99)
Glucose, Bld: 169 mg/dL — ABNORMAL HIGH (ref 70–99)
Potassium: 4.1 mmol/L (ref 3.5–5.1)
Potassium: 3.7 mmol/L (ref 3.5–5.1)
Sodium: 136 mmol/L (ref 135–145)
Sodium: 136 mmol/L (ref 135–145)

## 2021-06-23 LAB — GLUCOSE, CAPILLARY
Glucose-Capillary: 289 mg/dL — ABNORMAL HIGH (ref 70–99)
Glucose-Capillary: 334 mg/dL — ABNORMAL HIGH (ref 70–99)
Glucose-Capillary: 266 mg/dL — ABNORMAL HIGH (ref 70–99)
Glucose-Capillary: 190 mg/dL — ABNORMAL HIGH (ref 70–99)

## 2021-06-23 LAB — MAGNESIUM: Magnesium: 2.2 mg/dL (ref 1.7–2.4)

## 2021-06-23 MED ORDER — INSULIN GLARGINE-YFGN 100 UNIT/ML ~~LOC~~ SOLN
20.0000 [IU] | Freq: Two times a day (BID) | SUBCUTANEOUS | Status: DC
  Administered 2021-06-24: 20 [IU] via SUBCUTANEOUS
  Filled 2021-06-23 (×2): qty 0.2

## 2021-06-23 MED ORDER — MUPIROCIN 2 % EX OINT
TOPICAL_OINTMENT | Freq: Two times a day (BID) | CUTANEOUS | Status: DC
  Administered 2021-06-23: 1 via NASAL
  Filled 2021-06-23: qty 22

## 2021-06-23 MED ORDER — MOMETASONE FURO-FORMOTEROL FUM 200-5 MCG/ACT IN AERO
2.0000 | INHALATION_SPRAY | Freq: Two times a day (BID) | RESPIRATORY_TRACT | Status: DC
  Filled 2021-06-23: qty 8.8

## 2021-06-23 MED ORDER — POLYETHYLENE GLYCOL 3350 17 G PO PACK
17.0000 g | PACK | Freq: Every day | ORAL | Status: DC
  Administered 2021-06-24 – 2021-06-25 (×2): 17 g via ORAL
  Filled 2021-06-23 (×2): qty 1

## 2021-06-23 MED ORDER — ENOXAPARIN SODIUM 30 MG/0.3ML IJ SOSY
30.0000 mg | PREFILLED_SYRINGE | INTRAMUSCULAR | Status: DC
  Filled 2021-06-23 (×2): qty 0.3

## 2021-06-23 MED ORDER — INSULIN ASPART 100 UNIT/ML IJ SOLN: 0.0000 [IU] | Freq: Three times a day (TID) | INTRAMUSCULAR | Status: DC

## 2021-06-23 MED ORDER — INSULIN ASPART 100 UNIT/ML IJ SOLN
0.0000 [IU] | Freq: Three times a day (TID) | INTRAMUSCULAR | Status: DC
  Administered 2021-06-23: 11 [IU] via SUBCUTANEOUS
  Administered 2021-06-23 (×2): 8 [IU] via SUBCUTANEOUS
  Administered 2021-06-24: 11 [IU] via SUBCUTANEOUS
  Administered 2021-06-24: 8 [IU] via SUBCUTANEOUS
  Administered 2021-06-24: 3 [IU] via SUBCUTANEOUS
  Administered 2021-06-25: 5 [IU] via SUBCUTANEOUS
  Administered 2021-06-25: 8 [IU] via SUBCUTANEOUS

## 2021-06-23 MED ORDER — TORSEMIDE 20 MG PO TABS
60.0000 mg | ORAL_TABLET | Freq: Every day | ORAL | Status: DC
  Administered 2021-06-24 – 2021-06-25 (×2): 60 mg via ORAL
  Filled 2021-06-23 (×2): qty 3

## 2021-06-23 MED ORDER — INSULIN GLARGINE-YFGN 100 UNIT/ML ~~LOC~~ SOLN
10.0000 [IU] | Freq: Once | SUBCUTANEOUS | Status: AC
  Administered 2021-06-23: 10 [IU] via SUBCUTANEOUS
  Filled 2021-06-23: qty 0.1

## 2021-06-23 MED ORDER — MOMETASONE FURO-FORMOTEROL FUM 200-5 MCG/ACT IN AERO
2.0000 | INHALATION_SPRAY | Freq: Two times a day (BID) | RESPIRATORY_TRACT | Status: DC
  Administered 2021-06-24 – 2021-06-25 (×3): 2 via RESPIRATORY_TRACT
  Filled 2021-06-23 (×2): qty 8.8

## 2021-06-23 NOTE — Progress Notes (Signed)
Occupational Therapy Treatment Patient Details Name: Jamie [name] MRN: [id number] DOB: 1961-08-13 Today's Date: 06/23/2021   History of present illness Pt is a 60 y/o female admitted on 10/26 with shortness of breath due to L sided pleural effusion.  PMH is significant for recurrent pleural effusions, CHF, COPD, type 2 diabetes, HTN, PAD, right BKA, CKD.Pt is a 60 y/o female admitted on 10/26 with shortness of breath due to L sided pleural effusion.  PMH is significant for recurrent pleural effusions, CHF, COPD, type 2 diabetes, HTN, PAD, right BKA, CKD.   OT comments  Pt received supine and agreeable to treatment. Demonstrating increased indep with participation in ADLs including bathing and dressing as compared to previous session. Able to perform rolling in bed without assistance. She continues to need assist to progress to EOB and to perform squat pivot to chair. Her affect is flat throughout session although mood appeared improved once seated in chair. She is upset about d/c plan as she would like to get stronger and does not feel this will happen at the facility she has been at prior to admission. Notified Case Freight forwarder. Pt maintained stable vitals throughout session and with no c/o pain. RN resetting CVP after transfer to bedside chair.   Recommendations for follow up therapy are one component of a multi-disciplinary discharge planning process, led by the attending physician.  Recommendations may be updated based on patient status, additional functional criteria and insurance authorization.    Follow Up Recommendations  Skilled nursing-short term rehab (<3 hours/day)    Assistance Recommended at Discharge Frequent or constant Supervision/Assistance  Equipment Recommendations  Other (comment)    Recommendations for Other Services Other (comment)    Precautions / Restrictions Precautions Precautions: Fall;Other (comment) (hx R BKA) Restrictions Weight Bearing Restrictions: Yes RLE Weight  Bearing: Non weight bearing       Mobility Bed Mobility Overal bed mobility: Needs Assistance Bed Mobility: Supine to Sit;Rolling Rolling: Modified independent (Device/Increase time)   Supine to sit: Min guard      Transfers Overall transfer level: Needs assistance Equipment used: 1 person hand held assist Transfers: Squat Pivot Transfers       Squat pivot transfers: From elevated surface;Min assist     General transfer comment: min A for stability with squat pivot transfer from bed to recliner     Balance Overall balance assessment: Needs assistance   Sitting balance-Leahy Scale: Fair Sitting balance - Comments: able to sit EOB without UE assist and maintain balance               ADL either performed or assessed with clinical judgement   ADL Overall ADL's : Needs assistance/impaired     Grooming: Brushing hair;Oral care;Wash/dry face;Wash/dry hands;Set up;Bed level   Upper Body Bathing: Bed level;Min guard   Lower Body Bathing: Moderate assistance;Bed level   Upper Body Dressing : Minimal assistance;Sitting   Lower Body Dressing: Maximal assistance;Sitting/lateral leans     Toilet Transfer Details (indicate cue type and reason): deferred, pt declined         Functional mobility during ADLs: Minimal assistance;+2 for safety/equipment        Cognition Arousal/Alertness: Awake/alert Behavior During Therapy: Flat affect Overall Cognitive Status: Within Functional Limits for tasks assessed               General Comments: Pt very upset with current SNF facility, reporting that she was supposed to go there for Rehab and assistance in getting a prosthetic that fits however they have  not been able to assist with that                     Pertinent Vitals/ Pain       Pain Assessment: No/denies pain         Frequency  Min 2X/week        Progress Toward Goals  OT Goals(current goals can now be found in the care plan section)   Progress towards OT goals: Progressing toward goals  Acute Rehab OT Goals OT Goal Formulation: With patient Time For Goal Achievement: 07/02/21 Potential to Achieve Goals: [place] Discharge plan remains appropriate;Other (comment) (remains appropriate for SNF level rehab, does not wish to return to previous facility, communicated to case management)       AM-PAC OT "6 Clicks" Daily Activity     Outcome Measure   Help from another person eating meals?: A Little Help from another person taking care of personal grooming?: A Little Help from another person toileting, which includes using toliet, bedpan, or urinal?: A Lot Help from another person bathing (including washing, rinsing, drying)?: A Lot Help from another person to put on and taking off regular upper body clothing?: A Little Help from another person to put on and taking off regular lower body clothing?: A Lot 6 Click Score: 15    End of Session Equipment Utilized During Treatment: Oxygen  OT Visit Diagnosis: Other abnormalities of gait and mobility (R26.89);Muscle weakness (generalized) (M62.81)   Activity Tolerance No increased pain;Patient tolerated treatment well   Patient Left in chair;with call bell/phone within reach;with SCD's reapplied;Other (comment) (chaplain at bedside)   Nurse Communication Other (comment);Mobility status (completed bath following bed level incontinence of bowel and bladder)        Time: 7482-7078 OT Time Calculation (min): 55 min  Charges: OT General Charges $OT Visit: 1 Visit OT Treatments $Self Care/Home Management : 38-52 mins $Therapeutic Activity: 8-22 mins  {  Kasandra Knudsen, OTR/L 06/23/2021, 1:04 PM

## 2021-06-23 NOTE — Progress Notes (Signed)
This chaplain responded to the spiritual care consult for pastoral presence and prayer.  The Pt. is awake and settled in the bedside recliner after an OT visit. The chaplain witnessed a positive change in energy as the Pt. finished with the OT visit.  The Pt. accepted the space to tell her story through the chaplain's reflective listening.  "I want to walk" is the theme of the Pt. visit.  The chaplain understands the Pt. wants to build relationships with her family and is open to learning more about God's unconditional love.   The chaplain shared prayer with the Pt. as she set up the Pt. lunch.  Chaplain Sallyanne Kuster [phone]

## 2021-06-23 NOTE — Care Management Important Message (Signed)
Important Message  Patient Details  Name: Jamie [name] MRN: [id number] Date of Birth: 1961/01/30   Medicare Important Message Given:  Yes     Shelda Altes 06/23/2021, 11:01 AM

## 2021-06-23 NOTE — TOC Benefit Eligibility Note (Signed)
Patient Teacher, English as a foreign language completed.    The patient is currently admitted and upon discharge could be taking Entresto 24-26 mg.  The current 30 day co-pay is, $0.00.   The patient is insured through [place] [place] Patient Advocate Specialist [place] Patient Advocate Team Direct Number: [phone]  Fax: [phone]

## 2021-06-23 NOTE — Progress Notes (Signed)
Family Medicine Teaching Service Daily Progress Note Intern Pager: [phone]  Patient name: Jamie [name] Medical record number: [id number] Date of birth: [birth year] Age: 60 y.o. Gender: female  Primary Care Provider: Patient, No Pcp Per (Inactive) Consultants: Pulmonology, HF team, Palliative Code Status: DNR  Pt Overview and Major Events to Date:  10/26 Admitted  Assessment and Plan: Jamie [name] is a 60 year old female admitted for SOB with pleural effusion and CHF exacerbation. PMH significant for T2DM, HFpEF, COPD, CAD, PAD, HTN, R BKA and home 3L oxygen.  Acute on chronic hypoxic respiratory failure / Left transudative pleural effusion / CHF exacerbation Patient's shortness of breath today is improved today, no issues overnight. Lungs sounded clear to crackles or wheezes in upper lobes, mildly decreased in lower lobes. No LE Edema on exam. Weight today is down to 195 pounds from 198 pounds yesterday. UOP 652 mL, some in cannister but decreased after stopping lasix. On home 3L O2 [place] saturating well with no iWOB. Labs today showed Mag 2.2, K 4.1 -HF team following, appreciate recs- torsemide held today, spironolactone 12.5 mg daily, entresto BID, farxiga 10 mg daily, Imdur 120 mg daily.  -adding dulera 2 puffs BID (home med advair) -replete K as necessary -Pulmonology following, appreciate recs -BMP q12h -Mag daily  Hypertension BP has been normotensive 120/60s -Imdur 120 mg daily -Amlodipine 10 mg daily -Hydralazine 75 mg TID -clonidine 0.3 mg BID, consider decreasing -labetalol 300 mg TID -entresto 24/26 BID  -spironolactone 12.5 daily  T2DM CBGs have been 255-362, received 34 units aspart and basal 30 U semglee. Eating meals. 100%, 80% on last 2 meals. Home regimen is 28 U basal insulin levemir BID. -increase semglee basal to 20 units bid (already received 30 U so will add 10 U order for today) -moderate sliding scale with novolog -CBGs with meals/bedtime  Constipation Says she  has not had BM in 5 days. Mildly tender on abdominal examination but abdomen soft, normoactive BM. -Miralax scheduled today -Senna daily -monitor for BM  CKD Stage III Baseline creatinine of 2.0, today it is 2.19 up from 2.03. -monitor on Daily BMP -encourage oral hydration  Chronic Normocytic Anemia Last Hgb 8.9 yesterday 10/31, Baseline around 8-9.  -transfusion threshold < 8 -daily CBC  CAD s/p arthrectomy and PCI  Stable, Home medications of clopidogrel 75 mg daily, atorvastatin 80 mg daily and Imdur 120 mg daily. -continue home medications  Anxiety/Depression Stable. Not tearful on exam today. Psychiatry had seen patient during hospitalization. Continue home medications -klonopin 0.25 mg BID -effexor 112.5 mg daily -buspar 10 mg tid  FEN/GI: Heart Healthy Carb Modified PPx: lovenox 30 mg daily Dispo:SNF pending clinical improvement . Barriers include clinical status.   Subjective:  No complaints except for constipation today.  Objective: Temp:  [98 F (36.7 C)-98.3 F (36.8 C)] 98.3 F (36.8 C) (11/01 0331) Resp:  [16-18] 16 (11/01 0331) BP: (116-132)/(64-65) 120/65 (11/01 0331) SpO2:  [100 %] 100 % (11/01 0331) Weight:  [88.3 kg] 88.3 kg (11/01 0329) Physical Exam: General: Non-toxic, NAD Cardiovascular: RRR no m/r/g Respiratory: CTAB in upper lung fields no w/r/c Abdomen: Mildly tender to palpation, soft abdomen, normoactive BS Extremities: No LE edema, R BKA site  Laboratory: Recent Labs  Lab 06/21/21 0540 06/21/21 1722 06/22/21 0457  WBC 9.2 8.4 8.9  HGB 8.1* 8.6* 8.4*  HCT 25.7* 27.0* 25.9*  PLT 243 250 215   Recent Labs  Lab 06/18/21 0548 06/19/21 0258 06/22/21 0457 06/22/21 1708 06/23/21 0343  NA 137   < >  132* 135 136  K 4.2   < > 3.9 4.0 4.1  CL 103   < > 95* 96* 96*  CO2 28   < > 30 30 30   BUN 43*   < > 43* 46* 48*  CREATININE 1.98*   < > 1.96* 2.03* 2.19*  CALCIUM 8.6*   < > 8.7* 8.9 8.7*  PROT 6.0*  --  5.8*  --   --   BILITOT  0.7  --  0.5  --   --   ALKPHOS 72  --  70  --   --   ALT 13  --  14  --   --   AST 15  --  15  --   --   GLUCOSE 184*   < > 341* 298* 288*   < > = values in this interval not displayed.    Imaging/Diagnostic Tests:   Gerrit Heck, MD 06/23/2021, 12:53 PM PGY-1, [place] Intern pager: [phone], text pages welcome

## 2021-06-23 NOTE — Progress Notes (Addendum)
Inpatient Diabetes Program Recommendations  AACE/ADA: New Consensus Statement on Inpatient Glycemic Control   Target Ranges:  Prepandial:   less than 140 mg/dL      Peak postprandial:   less than 180 mg/dL (1-2 hours)      Critically ill patients:  140 - 180 mg/dL  Results for AAMORI, MCMASTERS (MRN [id number]) as of 06/23/2021 09:44  Ref. Range 06/22/2021 06:04 06/22/2021 11:22 06/22/2021 16:59 06/22/2021 21:15 06/23/2021 05:54  Glucose-Capillary Latest Ref Range: 70 - 99 mg/dL 304 (H) 362 (H) 317 (H) 255 (H) 289 (H)    Review of Glycemic Control  Diabetes history: DM2 Outpatient Diabetes medications: Levemir 28 units BID, Novolog 2-10 units QID Current orders for Inpatient glycemic control: Semglee 30 units daily, Novolog 0-15 units TID with meals, Farxiga 10 mg daily   Inpatient Diabetes Program Recommendations:     Insulin: Noted Semglee increased from 22 to 30 units today. Please consider ordering Novolog 0-5 units QHS and Novolog 5 units TID with meals for meal coverage if patient eats at least 50% of meals.  Thanks, Barnie Alderman, RN, MSN, CDE Diabetes Coordinator Inpatient Diabetes Program [phone] (Team Pager from 8am to 5pm)

## 2021-06-23 NOTE — Progress Notes (Addendum)
FPTS Brief Progress Note  S: Pt sleeping    O: BP 116/64 (BP Location: Left Arm)   Pulse 68   Temp 98 F (36.7 C) (Oral)   Resp 18   Ht 5\' 3"  (1.6 m)   Wt 90 kg   SpO2 100%   BMI 35.15 kg/m    GEN: sleeping  RESP: equal chest rise and fall  CVS: regular rate on monitor   A/P: VSS  - Orders reviewed. Labs for AM ordered, which was adjusted as needed.   Lyndee Hensen, DO 06/23/2021, 12:09 AM PGY-3, [place] Night Resident  Please page [phone] with questions.

## 2021-06-23 NOTE — Plan of Care (Signed)
  Problem: Nutrition: Goal: Adequate nutrition will be maintained Outcome: Progressing   

## 2021-06-23 NOTE — TOC Progression Note (Signed)
Transition of Care [facility]) - Progression Note    Patient Details  Name: Jamie [name] MRN: [id number] Date of Birth: 1960-10-11  Transition of Care [facility] CM/SW Contact  Adeyemi Hamad, [place] Phone Number: 06/23/2021, 2:22 PM  Clinical Narrative:    HF CSW spoke with Ms. Gonsalez at bedside regarding discharge and her concerns about getting discharged back to [facility]. CSW provided Ms. Nobles with bed offers and asked why she doesn't want to return to [place]. Ms. Overbey reported that there is nothing to do at [facility] and she just sits there. CSW had a realistic conversation with Ms. Kissel about the expectations of daily life at a SNF. CSW will follow up with Ms. Brainerd and is awaiting a response regarding conversation.  CSW will continue to follow throughout discharge.        Expected Discharge Plan and Services                                                 Social Determinants of Health (SDOH) Interventions    Readmission Risk Interventions No flowsheet data found.  Lenee Franze, MSW, [place] Heart Failure Social Worker

## 2021-06-23 NOTE — Progress Notes (Addendum)
Patient ID: Jamie [name], female   DOB: July 15, 1961, 60 y.o.   MRN: [id number]     Advanced Heart Failure Rounding Note  PCP-Cardiologist: None   Subjective:    Admitted w/ a/c hypoxic respiratory failure 2/2 a/c diastolic HF and recurrent left pleural effusion.   Yesterday switched to torsemide and farxiga added.   Creatinine trending up 1.8>2>2.1   Denies SOB.   Objective:   Weight Range: 88.3 kg Body mass index is 34.48 kg/m.   Vital Signs:   Temp:  [98 F (36.7 C)-98.3 F (36.8 C)] 98.3 F (36.8 C) (11/01 0331) Resp:  [16-18] 16 (11/01 0331) BP: (116-132)/(64-65) 120/65 (11/01 0331) SpO2:  [100 %] 100 % (11/01 0331) Weight:  [88.3 kg] 88.3 kg (11/01 0329) Last BM Date: 06/22/21  Weight change: Filed Weights   06/21/21 0527 06/22/21 0605 06/23/21 0329  Weight: 98.5 kg 90 kg 88.3 kg    Intake/Output:   Intake/Output Summary (Last 24 hours) at 06/23/2021 0708 Last data filed at 06/23/2021 0630 Gross per 24 hour  Intake 491.21 ml  Output 652 ml  Net -160.79 ml      Physical Exam   CVP 5 personally checked.  General:  . No resp difficulty HEENT: normal Neck: supple. no JVD. Carotids 2+ bilat; no bruits. No lymphadenopathy or thryomegaly appreciated. Cor: PMI nondisplaced. Regular rate & rhythm. No rubs, gallops or murmurs. Lungs: Decreased in the bases on 3 liters.  Abdomen: soft, nontender, nondistended. No hepatosplenomegaly. No bruits or masses. Good bowel sounds. Extremities: no cyanosis, clubbing, rash, edema. R BKA  Neuro: alert & orientedx3, cranial nerves grossly intact. moves all 4 extremities w/o difficulty. Affect pleasant   Telemetry   SR 80s   EKG    No new EKG to review   Labs    CBC Recent Labs    06/20/21 1144 06/21/21 0540 06/21/21 1722 06/22/21 0457  WBC 9.3   < > 8.4 8.9  NEUTROABS 7.7  --   --   --   HGB 8.4*   < > 8.6* 8.4*  HCT 26.5*   < > 27.0* 25.9*  MCV 84.1   < > 83.6 83.0  PLT 249   < > 250 215   < > = values in  this interval not displayed.   Basic Metabolic Panel Recent Labs    06/21/21 0540 06/21/21 1218 06/22/21 1708 06/23/21 0343  NA 134*   < > 135 136  K 4.4   < > 4.0 4.1  CL 98   < > 96* 96*  CO2 26   < > 30 30  GLUCOSE 287*   < > 298* 288*  BUN 41*   < > 46* 48*  CREATININE 1.88*   < > 2.03* 2.19*  CALCIUM 8.9   < > 8.9 8.7*  MG 2.2  --   --  2.2   < > = values in this interval not displayed.   Liver Function Tests Recent Labs    06/22/21 0457  AST 15  ALT 14  ALKPHOS 70  BILITOT 0.5  PROT 5.8*  ALBUMIN 2.5*    No results for input(s): LIPASE, AMYLASE in the last 72 hours. Cardiac Enzymes No results for input(s): CKTOTAL, CKMB, CKMBINDEX, TROPONINI in the last 72 hours.  BNP: BNP (last 3 results) Recent Labs    03/17/21 1145 06/17/21 1850 06/18/21 0548  BNP 261.8* 571.9* 650.6*    ProBNP (last 3 results) No results for input(s): PROBNP in the last  8760 hours.   D-Dimer No results for input(s): DDIMER in the last 72 hours. Hemoglobin A1C No results for input(s): HGBA1C in the last 72 hours.  Fasting Lipid Panel No results for input(s): CHOL, HDL, LDLCALC, TRIG, CHOLHDL, LDLDIRECT in the last 72 hours. Thyroid Function Tests No results for input(s): TSH, T4TOTAL, T3FREE, THYROIDAB in the last 72 hours.  Invalid input(s): FREET3  Other results:   Imaging    No results found.   Medications:     Scheduled Medications:  amLODipine  10 mg Oral Daily   aspirin EC  81 mg Oral Daily   atorvastatin  80 mg Oral QHS   busPIRone  10 mg Oral TID   Chlorhexidine Gluconate Cloth  6 each Topical Daily   clonazePAM  0.25 mg Oral BID   cloNIDine  0.3 mg Oral BID   clopidogrel  75 mg Oral Daily   dapagliflozin propanediol  10 mg Oral Daily   enoxaparin (LOVENOX) injection  40 mg Subcutaneous Q24H   gabapentin  100 mg Oral TID   hydrALAZINE  75 mg Oral TID   insulin aspart  0-15 Units Subcutaneous TID WC   insulin glargine-yfgn  30 Units Subcutaneous  Daily   isosorbide mononitrate  120 mg Oral Daily   labetalol  300 mg Oral Q12H   pantoprazole  40 mg Oral Daily   sacubitril-valsartan  1 tablet Oral BID   senna  2 tablet Oral QHS   sodium chloride flush  10-40 mL Intracatheter Q12H   spironolactone  12.5 mg Oral Daily   torsemide  60 mg Oral Daily   venlafaxine XR  112.5 mg Oral Q breakfast    Infusions:  sodium chloride Stopped (06/21/21 1529)     PRN Medications: nitroGLYCERIN, polyethylene glycol, polyvinyl alcohol, sodium chloride flush   Assessment/Plan   1. Acute on chronic hypoxemic respiratory failure: 09/2019 COVID-19 viral pneumonia. She has been on home oxygen 3L [place] and said to have COPD but has never smoked.  Suspect main issue here is combination of volume overload and recurrent L effusion.   CVP 5 today. Now on 3 liters which is her baseline. Stable O2 saturations.   - Pulmonary recommended not doing thoracentesis (had significant pain in the past with thoracentesis, effusion thought to be due to CHF).  2. Acute/chronic diastolic CHF: Multiple admissions with hypertensive urgency/diastolic CHF. Echo in 2/21 with EF 55-60%, normal RV, IVC suggesting RA pressure 8 mmHg.  [place] 3/21 showed optimized filling pressures with normal cardiac output.  With multiple diastolic CHF admissions, we placed a Cardiomems device 4/22 but later deactivated 03/16/21 due to malfunction of home pillow. Rep notified and will assist so readings can be followed again. Echo 7/22 EF 65%, normal RV. Now admitted w/ a/c CHF.  CXR w/ pulmonary edema + large left pleural effusion.  - CVP 5. Volume status stable. Continue torsemide, 60 mg daily.  - Continue Entresto 24/26 bid and spironolactone 12.5 daily with relatively stable creatinine.  - Continue Farxiga 10 mg daily.  She does not get frequent UTIs/yeast infections.  3. Pleural effusions: 09/2019 Had thoracenteses at [facility] and again bilaterally at [facility] in 3/21. Transudates with negative cytology.  Suspect due to CHF. Has recurrent L pleural effusion - Evaluated by PCCM, Advised against repeat thoracentesis. Pleurex cath not recommended 4. HTN: Renal artery dopplers in 3/21 with nonobstructive disease. BP ok.  - Continue amlodipine 10 mg daily.  - Continue hydralazine 75 mg tid.  - Continue labetalol 300 mg  bid.  - Continue clonidine 0.3 bid.   - Continue Imdur 120 mg daily  - Continue Entresto 24/26 bid.  - Continue spironolactone 12.5 daily.  5. CKD: Stage 3.  Suspect diabetic nephropathy. Baseline SCr historically ~2.0. 2.2 today.  6. Carotid stenosis: recent doppler 8/22: 1-39% RICA stenosis and 37-85% LICA stenosis  7. Odynophagia: Also with GERD symptoms.  EGD (6/22) with no obvious esophageal mechanical obstruction, likely benign fundic gland polyps, cannot exclude esophageal dysmotility and gastroparesis. 8. DM: Per [place] 9. CAD: In 3/21, she had atherectomy/PCI to prox/mid LAD and PCI mid LAD. For now, no intervention on moderate LCx disease. No chest pain.  - Continue Imdur 120 mg daily.  - Continue ASA 81 + Plavix 75.   - Continue atorvastatin 80 mg daily. Lipids checked (4/22). - Eventually, would transition from Plavix to rivaroxaban 2.5 mg bid given CAD and PAD.  10: PAD: S/p R BKA.  Mildly decreased ABI on left in 8/21.   - Continue statin, ASA, Plavix.  - Repeat peripheral arterial dopplers (5/22) showed mild left lower arterial disease.  11. Depression: Profound.  Has seen psychiatry, mood better today.   DNR/DNI  HF f/U 07/02/21 at 10:30  Plan to D/C back SNF. SW following.   Length of Stay: 5  Amy Clegg, NP  06/23/2021, 7:08 AM  Advanced Heart Failure Team Pager [phone] (M-F; 7a - 5p)  Please contact [facility] for night-coverage after hours (5p -7a ) and weekends on [email or web address]  Patient seen with NP, agree with the above note.   CVP 5 today.  Creatinine mildly higher at 2.19.  BP now controlled.  She says that her breathing seems to be back to  baseline.   General: NAD Neck: No JVD, no thyromegaly or thyroid nodule.  Lungs: Clear to auscultation bilaterally with normal respiratory effort. CV: Nondisplaced PMI.  Heart regular S1/S2, no S3/S4, no murmur.  No peripheral edema.   Abdomen: Soft, nontender, no hepatosplenomegaly, no distention.  Skin: Intact without lesions or rashes.  Neurologic: Alert and oriented x 3.  Psych: Depressed affect Extremities: No clubbing or cyanosis. Right BKA.  HEENT: Normal.   BP under better control this morning.  If remains good, would try to titrate down on the clonidine.    Creatinine mildly higher with CVP 5.  Will hold torsemide today, start back tomorrow.    Hopefully can get her Cardiomems pillow working again, have discussed with company.   Think she will be ready soon for discharge.  Will need close followup.   Loralie Champagne 06/23/2021 8:30 AM

## 2021-06-24 LAB — BASIC METABOLIC PANEL
Anion gap: 9 (ref 5–15)
Anion gap: 9 (ref 5–15)
BUN: 42 mg/dL — ABNORMAL HIGH (ref 6–20)
BUN: 44 mg/dL — ABNORMAL HIGH (ref 6–20)
CO2: 29 mmol/L (ref 22–32)
CO2: 29 mmol/L (ref 22–32)
Calcium: 8.6 mg/dL — ABNORMAL LOW (ref 8.9–10.3)
Calcium: 8.7 mg/dL — ABNORMAL LOW (ref 8.9–10.3)
Chloride: 98 mmol/L (ref 98–111)
Chloride: 97 mmol/L — ABNORMAL LOW (ref 98–111)
Creatinine, Ser: 1.95 mg/dL — ABNORMAL HIGH (ref 0.44–1.00)
Creatinine, Ser: 2.04 mg/dL — ABNORMAL HIGH (ref 0.44–1.00)
GFR, Estimated: 29 mL/min — ABNORMAL LOW (ref >60)
GFR, Estimated: 27 mL/min — ABNORMAL LOW (ref >60)
Glucose, Bld: 192 mg/dL — ABNORMAL HIGH (ref 70–99)
Glucose, Bld: 303 mg/dL — ABNORMAL HIGH (ref 70–99)
Potassium: 3.8 mmol/L (ref 3.5–5.1)
Potassium: 4 mmol/L (ref 3.5–5.1)
Sodium: 136 mmol/L (ref 135–145)
Sodium: 135 mmol/L (ref 135–145)

## 2021-06-24 LAB — CBC
HCT: 28.1 % — ABNORMAL LOW (ref 36.0–46.0)
Hemoglobin: 8.8 g/dL — ABNORMAL LOW (ref 12.0–15.0)
MCH: 26.3 pg (ref 26.0–34.0)
MCHC: 31.3 g/dL (ref 30.0–36.0)
MCV: 84.1 fL (ref 80.0–100.0)
Platelets: 227 10*3/uL (ref 150–400)
RBC: 3.34 MIL/uL — ABNORMAL LOW (ref 3.87–5.11)
RDW: 13.7 % (ref 11.5–15.5)
WBC: 9.8 10*3/uL (ref 4.0–10.5)
nRBC: 0 % (ref 0.0–0.2)

## 2021-06-24 LAB — GLUCOSE, CAPILLARY
Glucose-Capillary: 193 mg/dL — ABNORMAL HIGH (ref 70–99)
Glucose-Capillary: 280 mg/dL — ABNORMAL HIGH (ref 70–99)
Glucose-Capillary: 323 mg/dL — ABNORMAL HIGH (ref 70–99)
Glucose-Capillary: 229 mg/dL — ABNORMAL HIGH (ref 70–99)

## 2021-06-24 LAB — RESP PANEL BY RT-PCR (FLU A&B, COVID) ARPGX2
Influenza A by PCR: NEGATIVE
Influenza B by PCR: NEGATIVE
SARS Coronavirus 2 by RT PCR: NEGATIVE

## 2021-06-24 LAB — MAGNESIUM: Magnesium: 2.3 mg/dL (ref 1.7–2.4)

## 2021-06-24 MED ORDER — INSULIN GLARGINE-YFGN 100 UNIT/ML ~~LOC~~ SOLN
22.0000 [IU] | Freq: Two times a day (BID) | SUBCUTANEOUS | Status: DC
  Administered 2021-06-24 – 2021-06-25 (×2): 22 [IU] via SUBCUTANEOUS
  Filled 2021-06-24 (×3): qty 0.22

## 2021-06-24 MED ORDER — LACTULOSE 10 GM/15ML PO SOLN
20.0000 g | Freq: Every day | ORAL | Status: DC
  Administered 2021-06-24 – 2021-06-25 (×2): 20 g via ORAL
  Filled 2021-06-24 (×2): qty 30

## 2021-06-24 NOTE — Progress Notes (Signed)
Inpatient Diabetes Program Recommendations  AACE/ADA: New Consensus Statement on Inpatient Glycemic Control   Target Ranges:  Prepandial:   less than 140 mg/dL      Peak postprandial:   less than 180 mg/dL (1-2 hours)      Critically ill patients:  140 - 180 mg/dL   Results for Jamie [name], Jamie [name] (MRN [id number]) as of 06/24/2021 08:48  Ref. Range 06/23/2021 05:54 06/23/2021 12:02 06/23/2021 16:32 06/23/2021 21:06 06/24/2021 05:56  Glucose-Capillary Latest Ref Range: 70 - 99 mg/dL 289 (H) 334 (H) 266 (H) 190 (H) 193 (H)    Review of Glycemic Control  Diabetes history: DM2 Outpatient Diabetes medications: Levemir 28 units BID, Novolog 2-10 units QID Current orders for Inpatient glycemic control: Semglee 20 units BID,Novolog 0-15 units TID with meals, Farxiga 10 mg daily   Inpatient Diabetes Program Recommendations:     Insulin: Noted Semglee increased from 30 units daily to 20 units BID today. Please consider ordering Novolog 0-5 units QHS. If post prandial glucose remains consistently over 180 mg/dl, please consider ordering Novolog 4 units TID with meals for meal coverage if patient eats at least 50% of meals.   Thanks, Barnie Alderman, RN, MSN, CDE Diabetes Coordinator Inpatient Diabetes Program [phone] (Team Pager from 8am to 5pm)

## 2021-06-24 NOTE — TOC Progression Note (Addendum)
Transition of Care [facility]) - Progression Note    Patient Details  Name: Jamie [name] MRN: [id number] Date of Birth: May 30, 1961  Transition of Care [facility]) CM/SW [place], [place] Phone Number: 06/24/2021, 3:09 PM  Clinical Narrative:    HF CSW spoke with Mrs. Cicio regarding the discharge plan and going back to [facility] or another facility and that [facility] a bed offer. Mrs. Candella reported that she would like to speak with her son about it. CSW followed up with Mrs. Funderburke again to see if she has made a decision and she reported that her son is working and she hasn't been able to speak with him. CSW reached out to the attending MD regarding patient's readiness for discharge and for a COVID test before discharge.  CSW spoke with the patient's daughter in law, Sonja Manseau [phone] who reported she and her husband Erlene Quan have MPOA in place for Ms. [name] and that they would like a medical update regarding Ms. Bommarito. CSW let the treatment team and attending MD know to reach out. CSW informed Leafy Ro about the discharge plan and Ms. Horney's desire not to return to [place] and that [facility] has extended an offer for rehab at discharge tomorrow. According to the treatment team Ms. Marcoux is medically ready for discharge. Leafy Ro is supportive of Ms. Edmundson making a decision for rehab for herself and to arrange transportation at time of discharge.  CSW will continue to follow throughout discharge.        Expected Discharge Plan and Services                                                 Social Determinants of Health (SDOH) Interventions    Readmission Risk Interventions No flowsheet data found.

## 2021-06-24 NOTE — Progress Notes (Addendum)
Patient ID: Jamie [name], female   DOB: [birth year], 60 y.o.   MRN: [id number]     Advanced Heart Failure Rounding Note  PCP-Cardiologist: None   Subjective:    Admitted w/ a/c hypoxic respiratory failure 2/2 a/c diastolic HF and recurrent left pleural effusion.   Diuretics held yesterday w/ rising SCr and low CVP.   CVP 6 today. Stable on 3L [place]. No dyspnea.   BMP in process.   No complaints today. Breathing comfortably. No CP.   Objective:   Weight Range: 88.4 kg Body mass index is 34.52 kg/m.   Vital Signs:   Temp:  [98.3 F (36.8 C)-98.5 F (36.9 C)] 98.5 F (36.9 C) (11/02 0400) Pulse Rate:  [66-67] 67 (11/02 0400) Resp:  [10-18] 18 (11/02 0400) BP: (133-146)/(59-63) 144/63 (11/02 0400) SpO2:  [99 %-100 %] 100 % (11/02 0400) Weight:  [88.4 kg] 88.4 kg (11/02 0600) Last BM Date: 06/19/21  Weight change: Filed Weights   06/22/21 0605 06/23/21 0329 06/24/21 0600  Weight: 90 kg 88.3 kg 88.4 kg    Intake/Output:   Intake/Output Summary (Last 24 hours) at 06/24/2021 0716 Last data filed at 06/24/2021 0656 Gross per 24 hour  Intake 1100 ml  Output 1850 ml  Net -750 ml      Physical Exam   CVP 6-7 General:  Well appearing. No respiratory difficulty HEENT: normal Neck: supple. no JVD. Carotids 2+ bilat; no bruits. No lymphadenopathy or thyromegaly appreciated. Cor: PMI nondisplaced. Regular rate & rhythm. No rubs, gallops or murmurs. Lungs: clear Abdomen: soft, nontender, nondistended. No hepatosplenomegaly. No bruits or masses. Good bowel sounds. Extremities: no cyanosis, clubbing, rash, edema s/p rt bka  Neuro: alert & oriented x 3, cranial nerves grossly intact. moves all 4 extremities w/o difficulty. Affect pleasant.  Telemetry   NSR 80s, personally reviewed   EKG    No new EKG to review   Labs    CBC Recent Labs    06/22/21 0457 06/24/21 0655  WBC 8.9 9.8  HGB 8.4* 8.8*  HCT 25.9* 28.1*  MCV 83.0 84.1  PLT 215 456   Basic Metabolic  Panel Recent Labs    06/23/21 0343 06/23/21 2255  NA 136 136  K 4.1 3.7  CL 96* 98  CO2 30 29  GLUCOSE 288* 169*  BUN 48* 44*  CREATININE 2.19* 2.08*  CALCIUM 8.7* 8.5*  MG 2.2  --    Liver Function Tests Recent Labs    06/22/21 0457  AST 15  ALT 14  ALKPHOS 70  BILITOT 0.5  PROT 5.8*  ALBUMIN 2.5*    No results for input(s): LIPASE, AMYLASE in the last 72 hours. Cardiac Enzymes No results for input(s): CKTOTAL, CKMB, CKMBINDEX, TROPONINI in the last 72 hours.  BNP: BNP (last 3 results) Recent Labs    03/17/21 1145 06/17/21 1850 06/18/21 0548  BNP 261.8* 571.9* 650.6*    ProBNP (last 3 results) No results for input(s): PROBNP in the last 8760 hours.   D-Dimer No results for input(s): DDIMER in the last 72 hours. Hemoglobin A1C No results for input(s): HGBA1C in the last 72 hours.  Fasting Lipid Panel No results for input(s): CHOL, HDL, LDLCALC, TRIG, CHOLHDL, LDLDIRECT in the last 72 hours. Thyroid Function Tests No results for input(s): TSH, T4TOTAL, T3FREE, THYROIDAB in the last 72 hours.  Invalid input(s): FREET3  Other results:   Imaging    No results found.   Medications:     Scheduled Medications:  amLODipine  10  mg Oral Daily   aspirin EC  81 mg Oral Daily   atorvastatin  80 mg Oral QHS   busPIRone  10 mg Oral TID   Chlorhexidine Gluconate Cloth  6 each Topical Daily   clonazePAM  0.25 mg Oral BID   cloNIDine  0.3 mg Oral BID   clopidogrel  75 mg Oral Daily   dapagliflozin propanediol  10 mg Oral Daily   enoxaparin (LOVENOX) injection  30 mg Subcutaneous Q24H   gabapentin  100 mg Oral TID   hydrALAZINE  75 mg Oral TID   insulin aspart  0-15 Units Subcutaneous TID WC   insulin glargine-yfgn  20 Units Subcutaneous BID   isosorbide mononitrate  120 mg Oral Daily   labetalol  300 mg Oral Q12H   mometasone-formoterol  2 puff Inhalation BID   mupirocin ointment   Nasal BID   pantoprazole  40 mg Oral Daily   polyethylene glycol   17 g Oral Daily   sacubitril-valsartan  1 tablet Oral BID   senna  2 tablet Oral QHS   sodium chloride flush  10-40 mL Intracatheter Q12H   spironolactone  12.5 mg Oral Daily   torsemide  60 mg Oral Daily   venlafaxine XR  112.5 mg Oral Q breakfast    Infusions:  sodium chloride Stopped (06/21/21 1529)     PRN Medications: nitroGLYCERIN, polyvinyl alcohol, sodium chloride flush   Assessment/Plan   1. Acute on chronic hypoxemic respiratory failure: 09/2019 COVID-19 viral pneumonia. She has been on home oxygen 3L [place] and said to have COPD but has never smoked.  Suspect main issue here is combination of volume overload and recurrent L effusion.   - Improved w/ diuresis. Now on 3 liters which is her baseline. Stable O2 saturations.   - Pulmonary recommended not doing thoracentesis (had significant pain in the past with thoracentesis, effusion thought to be due to CHF).  2. Acute/chronic diastolic CHF: Multiple admissions with hypertensive urgency/diastolic CHF. Echo in 2/21 with EF 55-60%, normal RV, IVC suggesting RA pressure 8 mmHg.  [place] 3/21 showed optimized filling pressures with normal cardiac output.  With multiple diastolic CHF admissions, we placed a Cardiomems device 4/22 but later deactivated 03/16/21 due to malfunction of home pillow. Rep notified and will assist so readings can be followed again. Echo 7/22 EF 65%, normal RV. Now admitted w/ a/c CHF.  CXR w/ pulmonary edema + large left pleural effusion. Diuresed w/ IV Lasix  - Volume status improved, CVP 6-7 today. If renal fx stable on BMP, plan to resume torsemide at 60 mg daily  - Continue Entresto 24/26 bid and spironolactone 12.5 daily  - Continue Farxiga 10 mg daily.  She does not get frequent UTIs/yeast infections.  3. Pleural effusions: 09/2019 Had thoracenteses at [facility] and again bilaterally at [facility] in 3/21. Transudates with negative cytology. Suspect due to CHF. Has recurrent L pleural effusion - Evaluated by PCCM, Advised  against repeat thoracentesis. Pleurex cath not recommended 4. HTN: Renal artery dopplers in 3/21 with nonobstructive disease. BP ok.  - Continue amlodipine 10 mg daily.  - Continue hydralazine 75 mg tid.  - Continue labetalol 300 mg bid.  - Continue clonidine 0.3 bid.   - Continue Imdur 120 mg daily  - Continue Entresto 24/26 bid.  - Continue spironolactone 12.5 daily.  5. CKD: Stage 3.  Suspect diabetic nephropathy. Baseline SCr historically ~2.0. BMP pending  6. Carotid stenosis: recent doppler 8/22: 1-39% RICA stenosis and 73-71% LICA stenosis  7. Odynophagia: Also with GERD symptoms.  EGD (6/22) with no obvious esophageal mechanical obstruction, likely benign fundic gland polyps, cannot exclude esophageal dysmotility and gastroparesis. 8. DM: Per [place] 9. CAD: In 3/21, she had atherectomy/PCI to prox/mid LAD and PCI mid LAD. For now, no intervention on moderate LCx disease. No chest pain.  - Continue Imdur 120 mg daily.  - Continue ASA 81 + Plavix 75.   - Continue atorvastatin 80 mg daily. Lipids checked (4/22). - Eventually, would transition from Plavix to rivaroxaban 2.5 mg bid given CAD and PAD.  10: PAD: S/p R BKA.  Mildly decreased ABI on left in 8/21.   - Continue statin, ASA, Plavix.  - Repeat peripheral arterial dopplers (5/22) showed mild left lower arterial disease.  11. Depression: Profound.  Has seen psychiatry, mood better today.    Plan to d/c back to SNF soon.   Length of Stay: [address] [place]  06/24/2021, 7:16 AM  Advanced Heart Failure Team Pager [phone] (M-F; 7a - 5p)  Please contact [facility] for night-coverage after hours (5p -7a ) and weekends on [email or web address]  Patient seen with PA, agree with the above note.   Creatinine improved at 1.95 today, CVP around 8.  She feels about the same.   General: NAD Neck: No JVD, no thyromegaly or thyroid nodule.  Lungs: Clear to auscultation bilaterally with normal respiratory effort. CV: Nondisplaced  PMI.  Heart regular S1/S2, no S3/S4, no murmur.  No peripheral edema.   Abdomen: Soft, nontender, no hepatosplenomegaly, no distention.  Skin: Intact without lesions or rashes.  Neurologic: Alert and oriented x 3.  Psych: Normal affect. Extremities: No clubbing or cyanosis. Right BKA.  HEENT: Normal.   Restart torsemide 60 mg daily for home today.   BP relatively stable, continue current regimen without changes.    Will need followup in CHF clinic and will need to start using her Cardiomems device again.   Ready for discharge from my standpoint when SNF situation is sorted out.   Loralie Champagne 06/24/2021 11:17 AM

## 2021-06-24 NOTE — Progress Notes (Signed)
Family Medicine Teaching Service Daily Progress Note Intern Pager: [phone]  Jamie [name] name: Jamie [name] Medical record number: [id number] Date of birth: [birth year] Age: 60 y.o. Gender: female  Primary Care Provider: Patient, No Pcp Per (Inactive) Consultants: Pulmonology, HF team, Palliative Code Status: DNR  Pt Overview and Major Events to Date:  10/26 Admitted  Assessment and Plan: Jamie [name] is a 60 year old female admitted for SOB with pleural effusion and CHF exacerbation. PMH significant for T2DM, HFpEF, COPD, home 3L O2 CAD, PAD, HTN, R BKA.  Acute on Chronic Hypoxic Respiratory Failure / Left Transudative Pleural Effusion/ CHF Exacerbation Her breathing today is similar to yesterday. No acute events overnight. Lungs sounded clear today. Currently saturating well on home 3 L oxygen. Weight today is 194 from 195 yesterday. Torsemide was held yesterday and UOP was 1.8L in last 24 hours. Net down 3.7 L. Potassium this AM is 3.8 Mag is 2.3 -HF team following, appreciate recs -torsemide 60 mg today  -spironolactone 12.5 mg daily, entresto BID, farxiga 10 mg daily, Imdur 120 mg daily continued  -dulera 2 puffs BID -Pulm following appreciate recs -BMP q12h, Mag daily  HTN BP has been mildly elevated this morning to 130s-140s/60s.  -Imdur 120 mg daily -Amlodipine 10 mg daily -Hydralazine 75 mg TID -Clonidine 0.3 mg BID  -labetalol 300 mg TID -entresto 24/26 BID -spironolactone 12.5 mg daily  T2DM CBGs have been 169-334. Eating well 100% -20 units semglee BID -moderate sliding scale  Constipation No BM. Abdomen felt mildly tender but soft, normoactive BS. -Miralax scheduled received 1 dose yesterday morning -lactulose 20 g daily -Senna daily  CKD Stage III Creatine today was 1.95 from 2.19 yesterday. Baseline Cr 2.0. -monitor on BMP -encourage oral hydration  Chronic Normocytic Anemia Hgb baseline  -transfusion threshold <8 -daily CBC  CAD s/p arthrectomy and PCI Stable.  Continue home medications -clopidogrel 75 mg daily -atorvastatin 80 mg daily -imdur 120 mg daily  Anxiety/Depression Stable. Continue home meds. -klonopin 0.25 mg BID -effexor 112.5 mg daily -buspar 10 mg TID  FEN/GI: Heart Healthy Carb Modified PPx: Lovenox 30 mg daily Dispo:SNF medically stable  Subjective:  No complaints this AM  Objective: Temp:  [98.3 F (36.8 C)-98.5 F (36.9 C)] 98.5 F (36.9 C) (11/02 0400) Pulse Rate:  [66-67] 67 (11/02 0400) Resp:  [10-18] 18 (11/02 0400) BP: (133-146)/(59-63) 144/63 (11/02 0400) SpO2:  [99 %-100 %] 100 % (11/02 0400) Physical Exam: General: NAD Cardiovascular: RRR no m/r/g Respiratory: CTAB no iWOB Abdomen: Mildly tender to palpation, soft, normoactive BS Extremities: R BKA no edema  Laboratory: Recent Labs  Lab 06/21/21 0540 06/21/21 1722 06/22/21 0457  WBC 9.2 8.4 8.9  HGB 8.1* 8.6* 8.4*  HCT 25.7* 27.0* 25.9*  PLT 243 250 215   Recent Labs  Lab 06/18/21 0548 06/19/21 0258 06/22/21 0457 06/22/21 1708 06/23/21 0343 06/23/21 2255  NA 137   < > 132* 135 136 136  K 4.2   < > 3.9 4.0 4.1 3.7  CL 103   < > 95* 96* 96* 98  CO2 28   < > 30 30 30 29   BUN 43*   < > 43* 46* 48* 44*  CREATININE 1.98*   < > 1.96* 2.03* 2.19* 2.08*  CALCIUM 8.6*   < > 8.7* 8.9 8.7* 8.5*  PROT 6.0*  --  5.8*  --   --   --   BILITOT 0.7  --  0.5  --   --   --  ALKPHOS 72  --  70  --   --   --   ALT 13  --  14  --   --   --   AST 15  --  15  --   --   --   GLUCOSE 184*   < > 341* 298* 288* 169*   < > = values in this interval not displayed.    Imaging/Diagnostic Tests: No results found.   Gerrit Heck, MD 06/24/2021, 5:30 AM PGY-1, [place] Intern pager: [phone], text pages welcome

## 2021-06-24 NOTE — Progress Notes (Signed)
Physical Therapy Treatment Patient Details Name: Jamie [name] MRN: [id number] DOB: 14-Sep-1960 Today's Date: 06/24/2021   History of Present Illness Pt is a 60 y/o female admitted on 10/26 with shortness of breath due to L sided pleural effusion.  PMH is significant for recurrent pleural effusions, CHF, COPD, type 2 diabetes, HTN, PAD, right BKA, CKD.    PT Comments    Pt is supine in bed just having finished lunch, agreeable to getting up with therapy. Pt again states desire to walk again. Pt is limited in mobility progression by orthostatic hypotension in presence of generalized weakness and decreased endurance. Pt is currently min guard for bed mobility, min A for pivot to chair and modA for coming to standing. Once in standing pt with complaints of dizziness and sits back down quickly. Pt BP noted to have dropped. (See General Comments) D/c plans remain appropriate at this time. PT will continue to follow acutely.    Recommendations for follow up therapy are one component of a multi-disciplinary discharge planning process, led by the attending physician.  Recommendations may be updated based on patient status, additional functional criteria and insurance authorization.  Follow Up Recommendations  Skilled nursing-short term rehab (<3 hours/day)     Assistance Recommended at Discharge Intermittent Supervision/Assistance  Equipment Recommendations  None recommended by PT;Other (comment) (would benefit from new prosthetic)       Precautions / Restrictions Precautions Precautions: Fall Restrictions Weight Bearing Restrictions: No     Mobility  Bed Mobility Overal bed mobility: Needs Assistance Bed Mobility: Supine to Sit     Supine to sit: Min guard     General bed mobility comments: min guard for coming to EoB with use of bedrail    Transfers Overall transfer level: Needs assistance   Transfers: Squat Pivot Transfers;Sit to/from Stand Sit to Stand: Mod assist   Squat pivot  transfers: From elevated surface;Min assist       General transfer comment: min A for stability with squat pivot transfer from bed to recliner, modA for coming to standing in RW,    Ambulation/Gait             General Gait Details: unable          Balance Overall balance assessment: Needs assistance   Sitting balance-Leahy Scale: Fair Sitting balance - Comments: able to sit EOB without UE assist and maintain balance                                    Cognition Arousal/Alertness: Awake/alert Behavior During Therapy: Flat affect (upset about possible return to [facility]) Overall Cognitive Status: Within Functional Limits for tasks assessed                                             General Comments General comments (skin integrity, edema, etc.): pt with c/o of dizziness in full upright with return to seated BP dropped to 110/51, with 3 min recovery pt no longer with c/o of dizziness and BP 112/61 SaO2 >90% O2 on 3L O2 via [place],      Pertinent Vitals/Pain Pain Assessment: No/denies pain    Home Living Family/patient expects to be discharged to:: Skilled nursing facility  Additional Comments: requires 3L at baseline; been staying at [facility] (per chart review since 09/2019)        PT Goals (current goals can now be found in the care plan section) Acute Rehab PT Goals Patient Stated Goal: I want to get my leg. PT Goal Formulation: With patient Time For Goal Achievement: 07/02/21 Potential to Achieve Goals: Fair Progress towards PT goals: Progressing toward goals    Frequency    Min 2X/week      PT Plan Current plan remains appropriate       AM-PAC PT "6 Clicks" Mobility   Outcome Measure  Help needed turning from your back to your side while in a flat bed without using bedrails?: A Little Help needed moving from lying on your back to sitting on the side of a flat bed without using bedrails?:  A Little Help needed moving to and from a bed to a chair (including a wheelchair)?: A Little Help needed standing up from a chair using your arms (e.g., wheelchair or bedside chair)?: A Little Help needed to walk in hospital room?: Total Help needed climbing 3-5 steps with a railing? : Total 6 Click Score: 14    End of Session Equipment Utilized During Treatment: Oxygen Activity Tolerance: Patient tolerated treatment well;Patient limited by fatigue Patient left: with call bell/phone within reach;in chair;with chair alarm set;with nursing/sitter in room Nurse Communication: Mobility status PT Visit Diagnosis: Other abnormalities of gait and mobility (R26.89);Muscle weakness (generalized) (M62.81)     Time: 3383-2919 PT Time Calculation (min) (ACUTE ONLY): 27 min  Charges:  $Therapeutic Activity: 23-37 mins                     Deborh Pense B. Migdalia Dk PT, DPT Acute Rehabilitation Services Pager [phone] Office [phone]    [place] 06/24/2021, 3:12 PM

## 2021-06-24 NOTE — Progress Notes (Signed)
Called and spoke with son Jamie [name]. Updated on his mothers condition, that she is likely approaching being ready for discharge. Discussed PT recommendation for SNF, we discussed option of her going home but worried about falling and being able to provide her the care she needs. Discussed options of palliative care and that they were planning for follow up outpatient. Answered all questions and concerns.   Yehuda Savannah MD

## 2021-06-24 NOTE — Progress Notes (Signed)
FPTS Interim Progress Note  S:Sound asleep. Did not wake patient. Vitals stable.   O: BP (!) 133/59 (BP Location: Left Arm)   Pulse 66   Temp 98.3 F (36.8 C) (Oral)   Resp 10   Ht 5\' 3"  (1.6 m)   Wt 88.3 kg   SpO2 100%   BMI 34.48 kg/m     A/P: Acute on chronic hypoxic respiratory failure / Left transudative pleural effusion / CHF exacerbation Torsemide held today per HF team. Pm BMP improving, largely unremarkable.    Hypertension Largely normotensive and stable. Most recent BP above. Continue BP and HF meds per plan in daily progress note.    T2DM QHS CBG 169 with total basal insulin dose of 20 units. Continue moderate sliding scale and CBG schedule.    Constipation No BM recorded in I/Os. Continue plan per daily progress note.     Ezequiel Essex, MD 06/24/2021, 1:59 AM PGY-2, [facility] pager [phone]

## 2021-06-24 NOTE — Plan of Care (Signed)
  Problem: Education: Goal: Ability to demonstrate management of disease process will improve Outcome: Progressing   Problem: Education: Goal: Ability to verbalize understanding of medication therapies will improve Outcome: Progressing   Problem: Activity: Goal: Capacity to carry out activities will improve Outcome: Progressing   Problem: Cardiac: Goal: Ability to achieve and maintain adequate cardiopulmonary perfusion will improve Outcome: Progressing   

## 2021-06-25 LAB — BASIC METABOLIC PANEL
Anion gap: 7 (ref 5–15)
BUN: 40 mg/dL — ABNORMAL HIGH (ref 6–20)
CO2: 29 mmol/L (ref 22–32)
Calcium: 8.6 mg/dL — ABNORMAL LOW (ref 8.9–10.3)
Chloride: 99 mmol/L (ref 98–111)
Creatinine, Ser: 2.04 mg/dL — ABNORMAL HIGH (ref 0.44–1.00)
GFR, Estimated: 27 mL/min — ABNORMAL LOW (ref >60)
Glucose, Bld: 273 mg/dL — ABNORMAL HIGH (ref 70–99)
Potassium: 3.6 mmol/L (ref 3.5–5.1)
Sodium: 135 mmol/L (ref 135–145)

## 2021-06-25 LAB — GLUCOSE, CAPILLARY
Glucose-Capillary: 300 mg/dL — ABNORMAL HIGH (ref 70–99)
Glucose-Capillary: 221 mg/dL — ABNORMAL HIGH (ref 70–99)

## 2021-06-25 MED ORDER — TORSEMIDE 60 MG PO TABS: 60.0000 mg | ORAL_TABLET | Freq: Every day | ORAL | Status: AC

## 2021-06-25 MED ORDER — ISOSORBIDE MONONITRATE ER 120 MG PO TB24: 120.0000 mg | ORAL_TABLET | Freq: Every day | ORAL | Status: AC

## 2021-06-25 MED ORDER — SENNA 8.6 MG PO TABS: 2.0000 | ORAL_TABLET | Freq: Every day | ORAL | 0 refills | Status: AC

## 2021-06-25 MED ORDER — SACUBITRIL-VALSARTAN 24-26 MG PO TABS: 1.0000 | ORAL_TABLET | Freq: Two times a day (BID) | ORAL | Status: AC

## 2021-06-25 MED ORDER — DAPAGLIFLOZIN PROPANEDIOL 10 MG PO TABS
10.0000 mg | ORAL_TABLET | Freq: Every day | ORAL | Status: AC
Start: 2021-06-25 — End: ?

## 2021-06-25 MED ORDER — INSULIN GLARGINE-YFGN 100 UNIT/ML ~~LOC~~ SOLN
6.0000 [IU] | Freq: Once | SUBCUTANEOUS | Status: AC
  Administered 2021-06-25: 6 [IU] via SUBCUTANEOUS
  Filled 2021-06-25: qty 0.06

## 2021-06-25 MED ORDER — SPIRONOLACTONE 25 MG PO TABS
12.5000 mg | ORAL_TABLET | Freq: Every day | ORAL | Status: AC
Start: 2021-06-26 — End: ?

## 2021-06-25 MED ORDER — VENLAFAXINE HCL ER 37.5 MG PO CP24: 112.5000 mg | ORAL_CAPSULE | Freq: Every day | ORAL | Status: AC

## 2021-06-25 MED ORDER — GABAPENTIN 100 MG PO CAPS
100.0000 mg | ORAL_CAPSULE | Freq: Three times a day (TID) | ORAL | Status: AC
Start: 2021-06-25 — End: ?

## 2021-06-25 MED ORDER — ATORVASTATIN CALCIUM 80 MG PO TABS
80.0000 mg | ORAL_TABLET | Freq: Every day | ORAL | Status: AC
Start: 2021-06-25 — End: ?

## 2021-06-25 MED ORDER — HYDRALAZINE HCL 100 MG PO TABS: 100.0000 mg | ORAL_TABLET | Freq: Three times a day (TID) | ORAL | Status: AC

## 2021-06-25 MED ORDER — INSULIN GLARGINE-YFGN 100 UNIT/ML ~~LOC~~ SOLN
28.0000 [IU] | Freq: Two times a day (BID) | SUBCUTANEOUS | Status: DC
  Filled 2021-06-25: qty 0.28

## 2021-06-25 MED ORDER — BUSPIRONE HCL 10 MG PO TABS
10.0000 mg | ORAL_TABLET | Freq: Three times a day (TID) | ORAL | Status: AC
Start: 2021-06-25 — End: ?

## 2021-06-25 MED ORDER — HYDRALAZINE HCL 50 MG PO TABS
100.0000 mg | ORAL_TABLET | Freq: Three times a day (TID) | ORAL | Status: DC
  Administered 2021-06-25: 100 mg via ORAL

## 2021-06-25 NOTE — Discharge Instructions (Addendum)
Dear Jamie [name],   Thank you so much for allowing [place] to be part of your care!  You were admitted to [facility] for shortness of breath. Cardiology and the heart team were involved in your care and helped to take fluid off of you to help you breath better. Please take your medications as listed.   POST-HOSPITAL & CARE INSTRUCTIONS Please let PCP/Specialists know of any changes that were made.  Please see medications section of this packet for any medication changes.   DOCTOR'S APPOINTMENT & FOLLOW UP CARE INSTRUCTIONS  Future Appointments  Date Time Provider [place]  07/02/2021 10:30 AM [facility] PA/NP [facility] None    RETURN TO CARE If you have shortness of breath and are requiring more oxygen from your baseline to breathe comfortably or start ahving persistent fevers over 101.   [facility]  [place], [place] [address] [phone]

## 2021-06-25 NOTE — TOC Progression Note (Addendum)
Transition of Care [facility]) - Progression Note    Patient Details  Name: Jamie [name] MRN: [id number] Date of Birth: [birth year]  Transition of Care [facility]) CM/SW [place], [place] Phone Number: 06/25/2021, 9:25 AM  Clinical Narrative:    HF CSW received a message from the Cardiologist about [name] going to the SNF and needing to locate a [employer] and it isn't working. CSW spoke with [name] who reported that she wants to go back to [place] and not [facility]. CSW let [place] [facility] [place] know that [name] will be going back to [facility]. CSW spoke with [name] [phone] about [name] discharging today and locating the [employer] and they do have the pillow and it is working but [name] is not compliant with using it and doesn't follow fluid restrictions or a heart healthy diet at the facility. CSW passed this information along to the Cardiologist.  Awaiting DC summary to send to facility. DC paperwork is on the chart and transportation has been scheduled for 4pm. COVID test is negative.  CSW will continue to follow throughout discharge.        Expected Discharge Plan and Services                                                 Social Determinants of Health (SDOH) Interventions    Readmission Risk Interventions No flowsheet data found.  Jamie [name], MSW, [place] Heart Failure Social Worker

## 2021-06-25 NOTE — Progress Notes (Signed)
Attempted to call report. No answer Transport here to pick up patient.

## 2021-06-25 NOTE — Progress Notes (Signed)
RUE PICC removed per order.  Site cleaned with CHG, covered with vaseline guaze and dry 2x2.  No signs of infection or complications noted.  Pt withdrawn when teaching performed, not willing to provide verbal feedback, but nods head in understanding.  Aniceto Boss RN aware of PICC removal.

## 2021-06-25 NOTE — Progress Notes (Addendum)
Patient ID: Jamie [name], female   DOB: August 09, 1961, 60 y.o.   MRN: [id number]     Advanced Heart Failure Rounding Note  PCP-Cardiologist: None   Subjective:    Admitted w/ a/c hypoxic respiratory failure 2/2 a/c diastolic HF and recurrent left pleural effusion. Improved w/ diuresis. On 3L [place] which is her home baseline.   Torsemide restarted yesterday. Wt down 1 lb. CVP 6. Scr 1.95>>2.04>>2.04 today.  K 3.6   SBPs 130s-140s.   Doing ok today. Denies dyspnea. Waiting on SNF bed.   Objective:   Weight Range: 87.7 kg Body mass index is 34.25 kg/m.   Vital Signs:   Temp:  [97.5 F (36.4 C)-98 F (36.7 C)] 97.8 F (36.6 C) (11/03 0337) Pulse Rate:  [68-71] 69 (11/03 0337) Resp:  [11-14] 12 (11/03 0337) BP: (127-145)/(54-84) 145/59 (11/03 0337) SpO2:  [98 %-100 %] 100 % (11/03 0337) Weight:  [87.7 kg] 87.7 kg (11/03 0337) Last BM Date: 06/23/21  Weight change: Filed Weights   06/23/21 0329 06/24/21 0600 06/25/21 0337  Weight: 88.3 kg 88.4 kg 87.7 kg    Intake/Output:   Intake/Output Summary (Last 24 hours) at 06/25/2021 0800 Last data filed at 06/25/2021 0343 Gross per 24 hour  Intake 720 ml  Output 1300 ml  Net -580 ml      Physical Exam   CVP 6  General:  Well appearing. No respiratory difficulty HEENT: normal Neck: supple. no JVD. Carotids 2+ bilat; no bruits. No lymphadenopathy or thyromegaly appreciated. Cor: PMI nondisplaced. Regular rate & rhythm. No rubs, gallops or murmurs. Lungs: clear Abdomen: soft, nontender, nondistended. No hepatosplenomegaly. No bruits or masses. Good bowel sounds. Extremities: no cyanosis, clubbing, rash, edema s/p rt BKA  Neuro: alert & oriented x 3, cranial nerves grossly intact. moves all 4 extremities w/o difficulty. Affect pleasant.   Telemetry   NSR 80s, personally reviewed   EKG    No new EKG to review   Labs    CBC Recent Labs    06/24/21 0655  WBC 9.8  HGB 8.8*  HCT 28.1*  MCV 84.1  PLT 938   Basic  Metabolic Panel Recent Labs    06/23/21 0343 06/23/21 2255 06/24/21 0655 06/24/21 1655 06/25/21 0645  NA 136   < > 136 135 135  K 4.1   < > 3.8 4.0 3.6  CL 96*   < > 98 97* 99  CO2 30   < > 29 29 29   GLUCOSE 288*   < > 192* 303* 273*  BUN 48*   < > 42* 44* 40*  CREATININE 2.19*   < > 1.95* 2.04* 2.04*  CALCIUM 8.7*   < > 8.6* 8.7* 8.6*  MG 2.2  --  2.3  --   --    < > = values in this interval not displayed.   Liver Function Tests No results for input(s): AST, ALT, ALKPHOS, BILITOT, PROT, ALBUMIN in the last 72 hours.   No results for input(s): LIPASE, AMYLASE in the last 72 hours. Cardiac Enzymes No results for input(s): CKTOTAL, CKMB, CKMBINDEX, TROPONINI in the last 72 hours.  BNP: BNP (last 3 results) Recent Labs    03/17/21 1145 06/17/21 1850 06/18/21 0548  BNP 261.8* 571.9* 650.6*    ProBNP (last 3 results) No results for input(s): PROBNP in the last 8760 hours.   D-Dimer No results for input(s): DDIMER in the last 72 hours. Hemoglobin A1C No results for input(s): HGBA1C in the last 72 hours.  Fasting Lipid Panel No results for input(s): CHOL, HDL, LDLCALC, TRIG, CHOLHDL, LDLDIRECT in the last 72 hours. Thyroid Function Tests No results for input(s): TSH, T4TOTAL, T3FREE, THYROIDAB in the last 72 hours.  Invalid input(s): FREET3  Other results:   Imaging    No results found.   Medications:     Scheduled Medications:  amLODipine  10 mg Oral Daily   aspirin EC  81 mg Oral Daily   atorvastatin  80 mg Oral QHS   busPIRone  10 mg Oral TID   Chlorhexidine Gluconate Cloth  6 each Topical Daily   clonazePAM  0.25 mg Oral BID   cloNIDine  0.3 mg Oral BID   clopidogrel  75 mg Oral Daily   dapagliflozin propanediol  10 mg Oral Daily   enoxaparin (LOVENOX) injection  30 mg Subcutaneous Q24H   gabapentin  100 mg Oral TID   hydrALAZINE  75 mg Oral TID   insulin aspart  0-15 Units Subcutaneous TID WC   insulin glargine-yfgn  22 Units Subcutaneous  BID   isosorbide mononitrate  120 mg Oral Daily   labetalol  300 mg Oral Q12H   lactulose  20 g Oral Daily   mometasone-formoterol  2 puff Inhalation BID   mupirocin ointment   Nasal BID   pantoprazole  40 mg Oral Daily   polyethylene glycol  17 g Oral Daily   sacubitril-valsartan  1 tablet Oral BID   senna  2 tablet Oral QHS   sodium chloride flush  10-40 mL Intracatheter Q12H   spironolactone  12.5 mg Oral Daily   torsemide  60 mg Oral Daily   venlafaxine XR  112.5 mg Oral Q breakfast    Infusions:  sodium chloride Stopped (06/21/21 1529)     PRN Medications: nitroGLYCERIN, polyvinyl alcohol, sodium chloride flush   Assessment/Plan   1. Acute on chronic hypoxemic respiratory failure: 09/2019 COVID-19 viral pneumonia. She has been on home oxygen 3L [place] and said to have COPD but has never smoked.  Suspect main issue here is combination of volume overload and recurrent L effusion.   - Improved w/ diuresis. Now on 3 liters which is her baseline. Stable O2 saturations.   - Pulmonary recommended not doing thoracentesis (had significant pain in the past with thoracentesis, effusion thought to be due to CHF).  2. Acute/chronic diastolic CHF: Multiple admissions with hypertensive urgency/diastolic CHF. Echo in 2/21 with EF 55-60%, normal RV, IVC suggesting RA pressure 8 mmHg.  [place] 3/21 showed optimized filling pressures with normal cardiac output.  With multiple diastolic CHF admissions, we placed a Cardiomems device 4/22 but later deactivated 03/16/21 due to malfunction of home pillow. Rep notified and will assist so readings can be followed again. Echo 7/22 EF 65%, normal RV. Now admitted w/ a/c CHF.  CXR w/ pulmonary edema + large left pleural effusion. Diuresed w/ IV Lasix  - Volume status improved, CVP 6 today.  - Continue torsemide 60 mg daily  - Continue Entresto 24/26 bid and spironolactone 12.5 daily  - Continue Farxiga 10 mg daily.  She does not get frequent UTIs/yeast infections.   3. Pleural effusions: 09/2019 Had thoracenteses at [facility] and again bilaterally at [facility] in 3/21. Transudates with negative cytology. Suspect due to CHF. Has recurrent L pleural effusion - Evaluated by PCCM, Advised against repeat thoracentesis. Pleurex cath not recommended 4. HTN: Renal artery dopplers in 3/21 with nonobstructive disease. BP mildly elevated  - Continue amlodipine 10 mg daily.  - Increase hydralazine to  100 mg tid.  - Continue labetalol 300 mg bid.  - Continue clonidine 0.3 bid.   - Continue Imdur 120 mg daily  - Continue Entresto 24/26 bid.  - Continue spironolactone 12.5 daily.  5. CKD: Stage 3.  Suspect diabetic nephropathy. Baseline SCr historically ~2.0. BMP pending  6. Carotid stenosis: recent doppler 8/22: 1-39% RICA stenosis and 64-68% LICA stenosis  7. Odynophagia: Also with GERD symptoms.  EGD (6/22) with no obvious esophageal mechanical obstruction, likely benign fundic gland polyps, cannot exclude esophageal dysmotility and gastroparesis. 8. DM: Per [place] 9. CAD: In 3/21, she had atherectomy/PCI to prox/mid LAD and PCI mid LAD. For now, no intervention on moderate LCx disease. No chest pain.  - Continue Imdur 120 mg daily.  - Continue ASA 81 + Plavix 75.   - Continue atorvastatin 80 mg daily. Lipids checked (4/22). - Eventually, would transition from Plavix to rivaroxaban 2.5 mg bid given CAD and PAD.  10: PAD: S/p R BKA.  Mildly decreased ABI on left in 8/21.   - Continue statin, ASA, Plavix.  - Repeat peripheral arterial dopplers (5/22) showed mild left lower arterial disease.  11. Depression: Profound.  Has seen psychiatry, mood better today.    Stable from HF standpoint. Plan is to d/c to SNF once bed available.   Length of Stay: [address], PA-C  06/25/2021, 8:00 AM  Advanced Heart Failure Team Pager [phone] (M-F; 7a - 5p)  Please contact [facility] for night-coverage after hours (5p -7a ) and weekends on [email or web address]  Patient seen with  PA, agree with the above note.   Creatinine stable at 2.04.  CVP 6.    MAP remains elevated.   General: NAD Neck: No JVD, no thyromegaly or thyroid nodule.  Lungs: Clear to auscultation bilaterally with normal respiratory effort. CV: Nondisplaced PMI.  Heart regular S1/S2, no S3/S4, no murmur.  No peripheral edema.  Abdomen: Soft, nontender, no hepatosplenomegaly, no distention.  Skin: Intact without lesions or rashes.  Neurologic: Alert and oriented x 3.  Psych: Normal affect. Extremities: No clubbing or cyanosis. Rt BKA.  HEENT: Normal.   Increase hydralazine as above to 100 mg tid.    Continue other meds as current.  Waiting for SNF bed.  Needs to start back to using Cardiomems when she gets out.   Loralie Champagne 06/25/2021 8:47 AM

## 2021-06-25 NOTE — Progress Notes (Signed)
Inpatient Diabetes Program Recommendations  AACE/ADA: New Consensus Statement on Inpatient Glycemic Control Target Ranges:  Prepandial:   less than 140 mg/dL      Peak postprandial:   less than 180 mg/dL (1-2 hours)      Critically ill patients:  140 - 180 mg/dL   Results for LENELL, MCCONNELL (MRN [id number]) as of 06/25/2021 07:55  Ref. Range 06/24/2021 05:56 06/24/2021 11:03 06/24/2021 16:20 06/24/2021 21:27 06/25/2021 06:00  Glucose-Capillary Latest Ref Range: 70 - 99 mg/dL 193 (H) 280 (H) 323 (H) 229 (H) 300 (H)    Review of Glycemic Control  Diabetes history: DM2 Outpatient Diabetes medications: Levemir 28 units BID, Novolog 2-10 units QID Current orders for Inpatient glycemic control: Semglee 22 units BID,Novolog 0-15 units TID with meals, Farxiga 10 mg daily   Inpatient Diabetes Program Recommendations:     Insulin:  Please consider increasing Semglee to 25 units BID, ordering Novolog 0-5 units QHS, and ordering Novolog 4 units TID with meals for meal coverage if patient eats at least 50% of meals.   Thanks, Barnie Alderman, RN, MSN, CDE Diabetes Coordinator Inpatient Diabetes Program [phone] (Team Pager from 8am to 5pm)

## 2021-06-25 NOTE — Progress Notes (Signed)
Occupational Therapy Treatment Patient Details Name: Jamie [name] MRN: [id number] DOB: 08/16/1961 Today's Date: 06/25/2021   History of present illness Pt is a 60 y/o female admitted on 10/26 with shortness of breath due to L sided pleural effusion.  PMH is significant for recurrent pleural effusions, CHF, COPD, type 2 diabetes, HTN, PAD, right BKA, CKD.   OT comments  Pt declined OOB to chair, but agreed to sit EOB and participated in grooming and UB dressing with set up/supervision. Set up with lunch tray with HOB up at end of session.    Recommendations for follow up therapy are one component of a multi-disciplinary discharge planning process, led by the attending physician.  Recommendations may be updated based on patient status, additional functional criteria and insurance authorization.    Follow Up Recommendations  Skilled nursing-short term rehab (<3 hours/day)    Assistance Recommended at Discharge Frequent or constant Supervision/Assistance  Equipment Recommendations  None recommended by OT    Recommendations for Other Services      Precautions / Restrictions Precautions Precautions: Fall       Mobility Bed Mobility Overal bed mobility: Needs Assistance Bed Mobility: Supine to Sit;Sit to Supine     Supine to sit: Supervision Sit to supine: Supervision   General bed mobility comments: able to pull herself up in bed using headboard    Transfers                   General transfer comment: declined OOB to chair for lunch     Balance Overall balance assessment: Needs assistance   Sitting balance-Leahy Scale: Fair Sitting balance - Comments: able to sit EOB without UE assist and maintain balance                                   ADL either performed or assessed with clinical judgement   ADL Overall ADL's : Needs assistance/impaired Eating/Feeding: Minimal assistance;Bed level Eating/Feeding Details (indicate cue type and reason): assisted  to cut meat Grooming: Wash/dry hands;Wash/dry face;Brushing hair;Oral care;Sitting;Supervision/safety Grooming Details (indicate cue type and reason): EOB         Upper Body Dressing : Set up;Sitting Upper Body Dressing Details (indicate cue type and reason): changed soiled gown                         Vision       Perception     Praxis      Cognition Arousal/Alertness: Awake/alert Behavior During Therapy: Flat affect Overall Cognitive Status: Impaired/Different from baseline Area of Impairment: Safety/judgement                         Safety/Judgement: Decreased awareness of deficits     General Comments: briefly tearful about returning to SNF          Exercises     Shoulder Instructions       General Comments      Pertinent Vitals/ Pain       Pain Assessment: No/denies pain  Home Living                                          Prior Functioning/Environment              Frequency  Min 2X/week        Progress Toward Goals  OT Goals(current goals can now be found in the care plan section)  Progress towards OT goals: Progressing toward goals  Acute Rehab OT Goals Patient Stated Goal: to walk with a prosthesis and return home OT Goal Formulation: With patient Time For Goal Achievement: 07/02/21 Potential to Achieve Goals: [place] Discharge plan remains appropriate    Co-evaluation                 AM-PAC OT "6 Clicks" Daily Activity     Outcome Measure   Help from another person eating meals?: A Little Help from another person taking care of personal grooming?: A Little Help from another person toileting, which includes using toliet, bedpan, or urinal?: A Lot Help from another person bathing (including washing, rinsing, drying)?: A Lot Help from another person to put on and taking off regular upper body clothing?: A Little Help from another person to put on and taking off regular lower body  clothing?: A Lot 6 Click Score: 15    End of Session Equipment Utilized During Treatment: Oxygen  OT Visit Diagnosis: Other abnormalities of gait and mobility (R26.89);Muscle weakness (generalized) (M62.81)   Activity Tolerance Patient tolerated treatment well   Patient Left in bed;with call bell/phone within reach;with bed alarm set   Nurse Communication          Time: 1165-7903 OT Time Calculation (min): 20 min  Charges: OT General Charges $OT Visit: 1 Visit OT Treatments $Self Care/Home Management : 8-22 mins  Jamie [name], OTR/L Acute Rehabilitation Services Pager: [phone] Office: [phone]   Jamie [name] 06/25/2021, 12:36 PM

## 2021-06-25 NOTE — Discharge Summary (Addendum)
[facility] Discharge Summary  Patient name: Jamie [name] Medical record number: [id number] Date of birth: 1961/04/08 Age: 60 y.o. Gender: female Date of Admission: 06/17/2021  Date of Discharge: 06/25/2021 Admitting Physician: Lenoria Chime, MD  Primary Care Provider: Patient, No Pcp Per (Inactive) Consultants: Cardiology HF, Pulmonology, Palliative  Indication for Hospitalization: Acute Hypoxic Respiratory Failure  Discharge Diagnoses/Problem List:  CHF Acute Respiratory Insufficiency Shortness of Breath Recurrent Left Pleural Effusion CAD MDD  Disposition: SNF  Discharge Condition: Stable  Discharge Exam:   General: NAD, awake, alert, responsive to questions Head: Normocephalic atraumatic CV: Regular rate and rhythm no murmurs rubs or gallops Respiratory: Clear to ausculation bilaterally, no wheezes rales or crackles, chest rises symmetrically, no increased work of breathing on home 3L oxygen, brisk cap refill Abdomen: Soft, non-tender, non-distended, normoactive bowel sounds  Extremities: Moves upper and lower extremities freely, no edema in LE. R BKA. Neuro: No focal deficits Skin: No rashes or lesions visualized   Brief Hospital Course:  Jamie [name] is a 60 y.o. female presenting with shortness of breath secondary to left side pleural effusion and CHF exacerbation. PMH is significant for recurrent pleural effusions, CHF, COPD, type 2 diabetes, HTN, PAD, right BKA, CKD, GERD.  Acute on Chronic Hypoxic respiratory failure Patient presented to the ED with complaint of dyspnea, chest pain and severe fatigue for the past few days. Per EMS she was hypoxic with increased oxygen requirement more than her baseline of 3L, She stabilized on 15 L [place]. CXR in the ED showed moderate to large pleural effusion with atelectasis. Pulmonology was consulted for thoracocentesis who deemed patient a poor candidate considering she underwent the procedure at her last  hospitalization. Patient was diuresed with IV lasix 80 mg BID and received antibiotics of CTX and azithromycin which was eventually discontinued after ruling out pneumonia. At end of hospital stay she was back to baseline 3L O2 and breathing comfortably. Was stable from HF standpoint on torsemide 55m daily at discharge. She was also started on Entresto 24/26 BID, Spironolactone 12.548mdaily, and Farxiga 1028maily. CVPs were monitored throughout hospital stay. Was net diuresed 4.3 L.  HTN Patient was continued on her home amlodipine 10 mg daily, labetalol 300 mg BID, Clonidine 0.3 BID, and Imdur 120 mg daily. Her hydralazine was increased to 100m47mD.  She was also started on Entresto and Spironolactone as above for her CHF.  T2DM Controlled with both long acting and sliding scale insulin in hospital. A1c was 6.6%. She was discharged on her home Levemir 28u BID and her home sliding scale.  CKD Stage III Creatinine remained relatively stable around 2 during hospitalization, which is consistent with her baseline ~1.9.  Chronic Normocytic Anemia Baseline around 8-9 likely due to anemia of chronic disease. Remained stable.  CAD s/p PCI Hospitalization from 10/20/2020-11/06/2019 pt had cardiac catheterization significant for three-vessel disease.  She had coronary arthrectomy and PCI to right coronary artery and PCI to mid LAD. Medications continued were aspirin 81 mg daily, atorvastatin 80 mg at bedtime, Plavix 75 mg daily, Imdur 1200 mg daily and Nitrostat 0.4 mg as needed.  Anxiety/Depression Psychiatry saw patient in hospital due to significant depression and anxiety. Buspar was increased to 10mg65m and Effexor was increased to 112.5mg d58my.  Constipation, resolved Mild, resolved at end of hospitalization   Issues for Follow Up:  Needs to restart Cardiomems device again and follow up CHF clinic Per psychiatry patient would benefit from counseling and outpatient psychiatric  referral Patient would also benefit from outpatient nephrology  Significant Procedures: PICC for CVP monitoring  Significant Labs and Imaging:  Recent Labs  Lab 06/21/21 1722 06/22/21 0457 06/24/21 0655  WBC 8.4 8.9 9.8  HGB 8.6* 8.4* 8.8*  HCT 27.0* 25.9* 28.1*  PLT 250 215 227   Recent Labs  Lab 06/19/21 0258 06/20/21 0329 06/21/21 0540 06/21/21 1218 06/22/21 0457 06/22/21 1708 06/23/21 0343 06/23/21 2255 06/24/21 0655 06/24/21 1655 06/25/21 0645  NA 136   < > 134*   < > 132*   < > 136 136 136 135 135  K 3.7   < > 4.4   < > 3.9   < > 4.1 3.7 3.8 4.0 3.6  CL 102   < > 98   < > 95*   < > 96* 98 98 97* 99  CO2 27   < > 26   < > 30   < > _0 GLUCOSE 215*   < > 287*   < > 341*   < > 288* 169* 192* 303* 273*  BUN 42*   < > 41*   < > 43*   < > 48* 44* 42* 44* 40*  CREATININE 1.82*   < > 1.88*   < > 1.96*   < > 2.19* 2.08* 1.95* 2.04* 2.04*  CALCIUM 8.5*   < > 8.9   < > 8.7*   < > 8.7* 8.5* 8.6* 8.7* 8.6*  MG 2.2  --  2.2  --   --   --  2.2  --  2.3  --   --   ALKPHOS  --   --   --   --  70  --   --   --   --   --   --   AST  --   --   --   --  15  --   --   --   --   --   --   ALT  --   --   --   --  14  --   --   --   --   --   --   ALBUMIN  --   --   --   --  2.5*  --   --   --   --   --   --    < > = values in this interval not displayed.    Results/Tests Pending at Time of Discharge: BMP  Discharge Medications:  Allergies as of 06/25/2021       Reactions   Codeine Other (See Comments)   headaches   Meperidine And Related Other (See Comments)   headaches   Tetracyclines & Related Other (See Comments)   Caused yeast infections   Tuberculin Tests Other (See Comments)        Medication List     STOP taking these medications    potassium chloride 10 MEQ tablet Commonly known as: KLOR-CON       TAKE these medications    acetaminophen 325 MG tablet Commonly known as: TYLENOL Take 650 mg by mouth every 6 (six) hours as needed for headache  (pain).   albuterol (2.5 MG/3ML) 0.083% nebulizer solution Commonly known as: PROVENTIL Take 2.5 mg by nebulization every 4 (four) hours as needed for wheezing or shortness of breath.   albuterol 108 (90 Base) MCG/ACT inhaler Commonly known as: VENTOLIN HFA Inhale 2 puffs into  the lungs every 6 (six) hours as needed for wheezing or shortness of breath.   amLODipine 10 MG tablet Commonly known as: NORVASC Take 1 tablet (10 mg total) by mouth daily.   aspirin 81 MG EC tablet Take 1 tablet (81 mg total) by mouth daily.   atorvastatin 80 MG tablet Commonly known as: LIPITOR Take 1 tablet (80 mg total) by mouth at bedtime. What changed: Another medication with the same name was removed. Continue taking this medication, and follow the directions you see here.   busPIRone 10 MG tablet Commonly known as: BUSPAR Take 1 tablet (10 mg total) by mouth 3 (three) times daily. What changed: when to take this   clonazePAM 0.25 MG disintegrating tablet Commonly known as: KLONOPIN Take 1 tablet (0.25 mg total) by mouth 2 (two) times daily. What changed: Another medication with the same name was removed. Continue taking this medication, and follow the directions you see here.   cloNIDine 0.3 MG tablet Commonly known as: CATAPRES Take 1 tablet (0.3 mg total) by mouth 2 (two) times daily.   clopidogrel 75 MG tablet Commonly known as: PLAVIX Take 75 mg by mouth daily.   dapagliflozin propanediol 10 MG Tabs tablet Commonly known as: FARXIGA Take 1 tablet (10 mg total) by mouth daily.   dicyclomine 10 MG capsule Commonly known as: BENTYL Take 10 mg by mouth every 6 (six) hours as needed for spasms.   ferrous sulfate 325 (65 FE) MG tablet Take 325 mg by mouth 2 (two) times daily.   fluticasone-salmeterol 250-50 MCG/ACT Aepb Commonly known as: ADVAIR Inhale 1 puff into the lungs in the morning and at bedtime.   gabapentin 100 MG capsule Commonly known as: NEURONTIN Take 1 capsule (100  mg total) by mouth 3 (three) times daily. What changed:  medication strength how much to take   Glucagon Emergency 1 MG Kit Inject 1 mg as directed See admin instructions. Inject 1 mg every 15 minutes as needed for blood sugar below 60   hydrALAZINE 100 MG tablet Commonly known as: APRESOLINE Take 1 tablet (100 mg total) by mouth 3 (three) times daily. What changed:  medication strength how much to take   insulin aspart 100 UNIT/ML injection Commonly known as: novoLOG Inject 0-9 Units into the skin 3 (three) times daily with meals. What changed:  how much to take when to take this additional instructions   insulin detemir 100 UNIT/ML injection Commonly known as: LEVEMIR Inject 28 Units into the skin 2 (two) times daily.   isosorbide mononitrate 120 MG 24 hr tablet Commonly known as: IMDUR Take 1 tablet (120 mg total) by mouth daily. Start taking on: June 26, 2021 What changed:  medication strength how much to take Another medication with the same name was removed. Continue taking this medication, and follow the directions you see here.   labetalol 300 MG tablet Commonly known as: NORMODYNE Take 300 mg by mouth every 12 (twelve) hours.   Lactobacillus 0.2-0.2 MG Tabs Take 1 tablet by mouth daily.   loperamide 2 MG tablet Commonly known as: IMODIUM A-D Take 2 mg by mouth every 6 (six) hours as needed for diarrhea or loose stools.   magnesium oxide 400 MG tablet Commonly known as: MAG-OX Take 400 mg by mouth 2 (two) times daily.   meclizine 25 MG tablet Commonly known as: ANTIVERT Take 25 mg by mouth every 8 (eight) hours as needed for dizziness. What changed: Another medication with the same name was removed. Continue  taking this medication, and follow the directions you see here.   metoCLOPramide 5 MG tablet Commonly known as: REGLAN Take 2.5 mg by mouth 3 (three) times daily before meals.   multivitamin with minerals Tabs tablet Take 1 tablet by mouth  daily.   nitroGLYCERIN 0.4 MG SL tablet Commonly known as: NITROSTAT Place 0.4 mg under the tongue every 5 (five) minutes as needed for chest pain.   ondansetron 4 MG tablet Commonly known as: ZOFRAN Take 4 mg by mouth every 6 (six) hours as needed for nausea or vomiting.   OXYGEN Inhale 3 L into the lungs continuous.   pantoprazole 40 MG tablet Commonly known as: PROTONIX Take 40 mg by mouth daily.   polyethylene glycol 17 g packet Commonly known as: MIRALAX / GLYCOLAX Take 17 g by mouth daily.   polyvinyl alcohol 1.4 % ophthalmic solution Commonly known as: LIQUIFILM TEARS Place 1 drop into both eyes every 8 (eight) hours as needed for dry eyes.   sacubitril-valsartan 24-26 MG Commonly known as: ENTRESTO Take 1 tablet by mouth 2 (two) times daily.   senna 8.6 MG Tabs tablet Commonly known as: SENOKOT Take 2 tablets (17.2 mg total) by mouth at bedtime.   spironolactone 25 MG tablet Commonly known as: ALDACTONE Take 0.5 tablets (12.5 mg total) by mouth daily. Start taking on: June 26, 2021   Torsemide 60 MG Tabs Take 60 mg by mouth daily. Start taking on: June 26, 2021 What changed:  medication strength how much to take   venlafaxine XR 37.5 MG 24 hr capsule Commonly known as: EFFEXOR-XR Take 3 capsules (112.5 mg total) by mouth daily with breakfast. Start taking on: June 26, 2021 What changed:  medication strength how much to take   Vitamin D (Ergocalciferol) 1.25 MG (50000 UNIT) Caps capsule Commonly known as: DRISDOL Take 50,000 Units by mouth See admin instructions. Take 50000 units every Monday and thursday       Discharge Instructions: Please refer to Patient Instructions section of EMR for full details.  Patient was counseled important signs and symptoms that should prompt return to medical care, changes in medications, dietary instructions, activity restrictions, and follow up appointments.   Follow-Up Appointments:  Follow-up  Information     [facility] Follow up on 07/02/2021.   Specialty: Cardiology Why: at 10:30 Contact information: [address] [id number] [place]                Gerrit Heck, MD 06/25/2021, 2:02 PM PGY-1, [place] Upper-Level Resident Addendum   I have discussed the above with Dr. Jinny Sanders and agree with the documentation. My edits for correction/addition/clarification are included above. Please see any attending notes.   Alcus Dad, MD PGY-2, [facility] 06/25/2021 2:35 PM

## 2021-06-25 NOTE — Plan of Care (Signed)
  Problem: Clinical Measurements: Goal: Respiratory complications will improve Outcome: Progressing   Problem: Pain Managment: Goal: General experience of comfort will improve Outcome: Progressing   

## 2021-06-25 NOTE — TOC Transition Note (Signed)
Transition of Care [facility]) - CM/SW Discharge Note   Patient Details  Name: Krystal Teachey MRN: [id number] Date of Birth: Apr 16, 1961  Transition of Care [facility] CM/SW Contact:  Coolidge, Elias-Fela Solis Phone Number: 06/25/2021, 2:11 PM   Clinical Narrative:    Patient will DC to: [facility], SNF Anticipated DC date: 06/25/21 Family notified: Patient declined Transport by: Ocie Cornfield   Per MD patient ready for DC to [facility], SNF. RN to call report prior to discharge [phone] Room# [phone]). RN, patient, patient's family, and facility notified of DC. Discharge Summary and FL2 sent to facility. DC packet on chart. Ambulance transport requested for patient, eta at 4pm.   CSW will sign off for now as social work intervention is no longer needed. Please consult [place] again if new needs arise.     Final next level of care: Skilled Nursing Facility Barriers to Discharge: No Barriers Identified   Patient Goals and CMS Choice Patient states their goals for this hospitalization and ongoing recovery are:: to return home at some point CMS Medicare.gov Compare Post Acute Care list provided to:: Patient Choice offered to / list presented to : Patient  Discharge Placement PASRR number recieved: 06/25/21            Patient [facility] bed at:  [facility]) Patient to be transferred to facility by: [facility] Name of family member notified: Pt. declined notifying family Patient and family notified of of transfer: 06/25/21  Discharge Plan and Services                                     Social Determinants of Health (SDOH) Interventions     Readmission Risk Interventions No flowsheet data found.   Genna Casimir, MSW, [place] Heart Failure Social Worker

## 2021-06-25 NOTE — Progress Notes (Signed)
The patient denies any concerns today. She was saturating well on her home oxygen of 3L.  No acute findings on her physical exam. She is at her baseline and stable for SNF discharge. I will cosign her discharge summary once completed by the resident.

## 2021-07-01 ENCOUNTER — Telehealth (HOSPITAL_COMMUNITY): Payer: Self-pay

## 2021-07-01 NOTE — Telephone Encounter (Signed)
Called to confirm/remind patient of their appointment at the [facility] on 07/02/21. In addition, patient's husband Louie Casa said patient will be coming from a facility.

## 2021-07-01 NOTE — Progress Notes (Addendum)
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  Several hospitalizations at [facility] for acute on chronic respiratory failure, bilateral pleural effusions and acute diastolic HF (08/31/30-10/26/55 and 10/01/19-10/06/19)  + thoracentesis 10/04/19 of 1 L (patient had had 4 other thoracenteses in the prior year).   Admitted 10/21/19 to [facility] with prolonged hospitalization due to COVID-19 PNA, respiratory failure, A/C diastolic HF and CAD with PCI to LAD and RCA. Also had Cardiomems placed given recurrent diastolic CHF.  She lives at [facility].    She returned 4/22 for f/u. PFTs reordered to investigate her lung disease.   Cardiomems (11/26/20) PADP 8; deactivated 03/16/21. 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  Echo 03/17/21: EF 60-65%, RV normal.  Admitted 06/17/2021 with acute on chronic hypoxemic respiratory failure secondary to a/c diastolic HF and recurrent L pleural effusion. Pulmonary did not recommend thoracentesis, had significant pain with procedure in the past. Diuresed well with IV lasix. Weight 87.7 kg.   She is here today for hospital f/u. Reports dyspnea has improved since hospitalization. Still has some shortness of breath with activity. Notes her cardiomems worked for 1 day after it was last troubleshooted and then unable to obtain further readings. Weight down about 1 kg from discharge. Back on baseline O2 requirement, 3L. She is not weighed every day at nursing home. She is very fearful of readmission.    ROS: All systems  negative except as listed in HPI, PMH and Problem List.  SH:  Social History   Socioeconomic History   Marital status: Unknown    Spouse name: Not on file   Number of children: Not on file   Years of education: Not on file   Highest education level: Not on file  Occupational History   Not on file  Tobacco Use   Smoking status: Never   Smokeless tobacco: Never  Substance and Sexual Activity   Alcohol use: Never   Drug use: Never   Sexual activity: Not on file  Other Topics Concern   Not on file  Social History Narrative   Not on file   Social Determinants of Health   Financial Resource Strain: Not on file  Food Insecurity: Not on file  Transportation Needs: Not on file  Physical Activity: Not on file  Stress: Not on file  Social Connections: Not on file  Intimate Partner Violence: Not on file   FH:  Family History  Problem Relation Age of Onset   Hypertension Brother    Diabetes Brother    Hypertension Maternal Grandmother    Heart attack Paternal Grandmother    Current Outpatient Medications  Medication Sig Dispense Refill   acetaminophen (TYLENOL) 325 MG tablet Take 650 mg by mouth every 6 (six) hours as needed for headache (pain).     albuterol (PROVENTIL) (2.5 MG/3ML) 0.083% nebulizer solution Take 2.5 mg by nebulization every 4 (four) hours as needed for wheezing or shortness of breath.     albuterol (VENTOLIN HFA) 108 (90 Base) MCG/ACT inhaler Inhale 2 puffs into the lungs every 6 (six) hours as needed for wheezing  or shortness of breath.     amLODipine (NORVASC) 10 MG tablet Take 1 tablet (10 mg total) by mouth daily. 30 tablet 0   aspirin EC 81 MG EC tablet Take 1 tablet (81 mg total) by mouth daily. 30 tablet 0   atorvastatin (LIPITOR) 80 MG tablet Take 1 tablet (80 mg total) by mouth at bedtime.     busPIRone (BUSPAR) 10 MG tablet Take 1 tablet (10 mg total) by mouth 3 (three) times daily.     clonazePAM (KLONOPIN) 0.25 MG disintegrating tablet Take 1  tablet (0.25 mg total) by mouth 2 (two) times daily. (Patient not taking: No sig reported) 60 tablet 0   cloNIDine (CATAPRES) 0.3 MG tablet Take 1 tablet (0.3 mg total) by mouth 2 (two) times daily. 60 tablet 0   clopidogrel (PLAVIX) 75 MG tablet Take 75 mg by mouth daily.     dapagliflozin propanediol (FARXIGA) 10 MG TABS tablet Take 1 tablet (10 mg total) by mouth daily. 30 tablet    dicyclomine (BENTYL) 10 MG capsule Take 10 mg by mouth every 6 (six) hours as needed for spasms.     ferrous sulfate 325 (65 FE) MG tablet Take 325 mg by mouth 2 (two) times daily.     fluticasone-salmeterol (ADVAIR) 250-50 MCG/ACT AEPB Inhale 1 puff into the lungs in the morning and at bedtime.     gabapentin (NEURONTIN) 100 MG capsule Take 1 capsule (100 mg total) by mouth 3 (three) times daily.     Glucagon, rDNA, (GLUCAGON EMERGENCY) 1 MG KIT Inject 1 mg as directed See admin instructions. Inject 1 mg every 15 minutes as needed for blood sugar below 60     hydrALAZINE (APRESOLINE) 100 MG tablet Take 1 tablet (100 mg total) by mouth 3 (three) times daily.     insulin aspart (NOVOLOG) 100 UNIT/ML injection Inject 0-9 Units into the skin 3 (three) times daily with meals. (Patient taking differently: Inject 2-10 Units into the skin in the morning, at noon, in the evening, and at bedtime. Sliding scale :  200-249 = 2 units 250-299=4 units 300-349=6 units 350-399=8 units 400-449=10 units) 10 mL 11   insulin detemir (LEVEMIR) 100 UNIT/ML injection Inject 28 Units into the skin 2 (two) times daily.     isosorbide mononitrate (IMDUR) 120 MG 24 hr tablet Take 1 tablet (120 mg total) by mouth daily.     labetalol (NORMODYNE) 300 MG tablet Take 300 mg by mouth every 12 (twelve) hours.     Lactobacillus 0.2-0.2 MG TABS Take 1 tablet by mouth daily.     loperamide (IMODIUM A-D) 2 MG tablet Take 2 mg by mouth every 6 (six) hours as needed for diarrhea or loose stools.     magnesium oxide (MAG-OX) 400 MG tablet Take 400 mg by  mouth 2 (two) times daily.     meclizine (ANTIVERT) 25 MG tablet Take 25 mg by mouth every 8 (eight) hours as needed for dizziness.     metoCLOPramide (REGLAN) 5 MG tablet Take 2.5 mg by mouth 3 (three) times daily before meals.     Multiple Vitamin (MULTIVITAMIN WITH MINERALS) TABS tablet Take 1 tablet by mouth daily. 30 tablet 0   nitroGLYCERIN (NITROSTAT) 0.4 MG SL tablet Place 0.4 mg under the tongue every 5 (five) minutes as needed for chest pain.     ondansetron (ZOFRAN) 4 MG tablet Take 4 mg by mouth every 6 (six) hours as needed for nausea or vomiting.     OXYGEN  Inhale 3 L into the lungs continuous.     pantoprazole (PROTONIX) 40 MG tablet Take 40 mg by mouth daily.     polyethylene glycol (MIRALAX / GLYCOLAX) 17 g packet Take 17 g by mouth daily.     polyvinyl alcohol (LIQUIFILM TEARS) 1.4 % ophthalmic solution Place 1 drop into both eyes every 8 (eight) hours as needed for dry eyes.     sacubitril-valsartan (ENTRESTO) 24-26 MG Take 1 tablet by mouth 2 (two) times daily. 60 tablet    senna (SENOKOT) 8.6 MG TABS tablet Take 2 tablets (17.2 mg total) by mouth at bedtime. 120 tablet 0   spironolactone (ALDACTONE) 25 MG tablet Take 0.5 tablets (12.5 mg total) by mouth daily.     torsemide 60 MG TABS Take 60 mg by mouth daily.     venlafaxine XR (EFFEXOR-XR) 37.5 MG 24 hr capsule Take 3 capsules (112.5 mg total) by mouth daily with breakfast.     Vitamin D, Ergocalciferol, (DRISDOL) 1.25 MG (50000 UNIT) CAPS capsule Take 50,000 Units by mouth See admin instructions. Take 50000 units every Monday and thursday     No current facility-administered medications for this visit.   There were no vitals taken for this visit.  Wt Readings from Last 3 Encounters:  06/25/21 87.7 kg (193 lb 5.5 oz)  12/15/20 77.4 kg (170 lb 9.6 oz)  05/21/20 68.5 kg (151 lb)   General:  Comfortable on 3L O2, Jamie [name]. Sitting in wheelchair. HEENT: normal Neck: supple. no JVD. Carotids 2+ bilat; no bruits.  Cor: PMI  nondisplaced. Regular rate & rhythm. No rubs, gallops or murmurs. Lungs: diminished left base Abdomen: soft, nontender, nondistended.  Extremities: no cyanosis, clubbing, rash, no edema on left, s/p BKA on right Neuro: alert & orientedx3, cranial nerves grossly intact. moves all 4 extremities w/o difficulty. Affect pleasant  ECG today: NSR 67 bpm   ASSESSMENT & PLAN: Chronic diastolic CHF: - Multiple admissions with hypertensive urgency/diastolic CHF. Echo in 2/21 with EF 55-60%, normal RV, IVC suggesting RA pressure 8 mmHg.  [facility] 3/21 showed optimized filling pressures with normal cardiac output.  With multiple diastolic CHF admissions, we placed a Cardiomems device. Deactivated 07/22. Briefly worked recently, but now unable to obtain readings. Nursing home provided number for tech support to trouble shoot pillow. - Most recent admit 10/22 with a/c HF.  - NYHA III. Volume appears stable. Weight down 1 kg from discharge. Gave orders to SNF for daily weights. Continue 60 mg Torsemide daily.  - continue Spiro 12.5 mg daily - Continue entresto 24/26 mg BID - continue farxiga 10 mg daily - Continue imdur 120 mg/hydralazine 100 mg TID - BMET/BNP today 2. Chronic hypoxemic respiratory failure: 09/2019 COVID-19 viral pneumonia. She has been on home oxygen 3L [place]. Said to have COPD but has never smoked.   - Recent admit with a/c respiratory failure. In setting of heart failure exacerbation and recurrent pleural effusion. Improved w/ diuresis. Now on 3 liters which is her baseline.   - Pulmonary recommended not doing thoracentesis (had significant pain in the past with thoracentesis, effusion thought to be due to CHF) or pleur-x catheter.  3. Pleural effusions: 09/2019 Had thoracenteses at [facility] and again bilaterally at [facility] in 3/21. Transudates with negative cytology. Suspect due to CHF. Has recurrent L pleural effusion - Evaluated by PCCM, Advised against repeat thoracentesis. Pleurex cath not  recommended 4. HTN: Renal artery dopplers in 3/21 with nonobstructive disease.  - Continue amlodipine 10 mg daily.  - Continue  hydralazine to 100 mg tid.  - Continue labetalol 300 mg bid.  - Continue clonidine 0.3 bid.   - Continue Imdur 120 mg daily  - Continue Entresto 24/26 bid.  - Continue spironolactone 12.5 daily.  - BP stable 5. CKD: Stage 3b/IV.  Suspect diabetic nephropathy. Baseline SCr historically ~2.0. BMET today. Likely need referral to Nephrology. 6. Carotid stenosis: recent doppler 8/22: 1-39% RICA stenosis and 24-09% LICA stenosis  7. Odynophagia: Also with GERD symptoms.  EGD (6/22) with no obvious esophageal mechanical obstruction, likely benign fundic gland polyps, cannot exclude esophageal dysmotility and gastroparesis. 8. DM: Per [facility] 9. CAD: In 3/21, she had PCI/orbital atherectomy/DES RCA and PCI/DES mid LAD. No intervention on moderate LCx disease. No chest pain.  - Continue Imdur 120 mg daily.  - Continue ASA 81 + Plavix 75.   - Continue atorvastatin 80 mg daily. Lipids checked (4/22). - Eventually, would transition from Plavix to rivaroxaban 2.5 mg bid given CAD and PAD.  10: PAD: S/p R BKA.   - Continue statin, ASA, Plavix.  - Arterial dopplers (5/22) showed mild left lower arterial disease.  11. Depression: Profound. Seen by psych during recent admit. Buspar and Effexor adjusted. 12. Anemia:  - Baseline Hgb 8-9. Likely anemia of chronic disease.  -Transfused with 1 U PRBCs during recent admit d/t hgb 7.5 - CBC today  Followup 2 weeks to reassess volume status  Kaylei Frink N, PA-C 07/01/2021

## 2021-07-02 ENCOUNTER — Ambulatory Visit (HOSPITAL_COMMUNITY)
Admit: 2021-07-02 | Discharge: 2021-07-02 | Disposition: A | Discharge status: Home / Self Care | Payer: Medicare Other | Attending: Cardiology | Admitting: Cardiology

## 2021-07-02 ENCOUNTER — Encounter (HOSPITAL_COMMUNITY): Payer: Self-pay

## 2021-07-02 VITALS — BP 120/60 | HR 67 | Wt 190.6 lb

## 2021-07-02 DIAGNOSIS — Z794 Long term (current) use of insulin: Secondary | ICD-10-CM | POA: Insufficient documentation

## 2021-07-02 DIAGNOSIS — J9611 Chronic respiratory failure with hypoxia: Secondary | ICD-10-CM | POA: Insufficient documentation

## 2021-07-02 DIAGNOSIS — R1310 Dysphagia, unspecified: Secondary | ICD-10-CM | POA: Diagnosis not present

## 2021-07-02 DIAGNOSIS — Z7902 Long term (current) use of antithrombotics/antiplatelets: Secondary | ICD-10-CM | POA: Insufficient documentation

## 2021-07-02 DIAGNOSIS — E1122 Type 2 diabetes mellitus with diabetic chronic kidney disease: Secondary | ICD-10-CM | POA: Diagnosis not present

## 2021-07-02 DIAGNOSIS — I779 Disorder of arteries and arterioles, unspecified: Secondary | ICD-10-CM | POA: Insufficient documentation

## 2021-07-02 DIAGNOSIS — F419 Anxiety disorder, unspecified: Secondary | ICD-10-CM | POA: Insufficient documentation

## 2021-07-02 DIAGNOSIS — N1832 Chronic kidney disease, stage 3b: Secondary | ICD-10-CM | POA: Diagnosis not present

## 2021-07-02 DIAGNOSIS — Z7984 Long term (current) use of oral hypoglycemic drugs: Secondary | ICD-10-CM | POA: Insufficient documentation

## 2021-07-02 DIAGNOSIS — E7800 Pure hypercholesterolemia, unspecified: Secondary | ICD-10-CM | POA: Diagnosis not present

## 2021-07-02 DIAGNOSIS — J449 Chronic obstructive pulmonary disease, unspecified: Secondary | ICD-10-CM | POA: Insufficient documentation

## 2021-07-02 DIAGNOSIS — Z8616 Personal history of COVID-19: Secondary | ICD-10-CM | POA: Insufficient documentation

## 2021-07-02 DIAGNOSIS — I130 Hypertensive heart and chronic kidney disease with heart failure and stage 1 through stage 4 chronic kidney disease, or unspecified chronic kidney disease: Secondary | ICD-10-CM | POA: Insufficient documentation

## 2021-07-02 DIAGNOSIS — I5032 Chronic diastolic (congestive) heart failure: Secondary | ICD-10-CM | POA: Insufficient documentation

## 2021-07-02 DIAGNOSIS — K219 Gastro-esophageal reflux disease without esophagitis: Secondary | ICD-10-CM | POA: Diagnosis not present

## 2021-07-02 DIAGNOSIS — I2510 Atherosclerotic heart disease of native coronary artery without angina pectoris: Secondary | ICD-10-CM | POA: Insufficient documentation

## 2021-07-02 DIAGNOSIS — Z7982 Long term (current) use of aspirin: Secondary | ICD-10-CM | POA: Diagnosis not present

## 2021-07-02 DIAGNOSIS — Z79899 Other long term (current) drug therapy: Secondary | ICD-10-CM | POA: Diagnosis not present

## 2021-07-02 DIAGNOSIS — Z9981 Dependence on supplemental oxygen: Secondary | ICD-10-CM | POA: Diagnosis not present

## 2021-07-02 DIAGNOSIS — F32A Depression, unspecified: Secondary | ICD-10-CM | POA: Diagnosis not present

## 2021-07-02 DIAGNOSIS — I10 Essential (primary) hypertension: Secondary | ICD-10-CM

## 2021-07-02 DIAGNOSIS — J90 Pleural effusion, not elsewhere classified: Secondary | ICD-10-CM | POA: Insufficient documentation

## 2021-07-02 DIAGNOSIS — E1151 Type 2 diabetes mellitus with diabetic peripheral angiopathy without gangrene: Secondary | ICD-10-CM | POA: Diagnosis not present

## 2021-07-02 DIAGNOSIS — D631 Anemia in chronic kidney disease: Secondary | ICD-10-CM | POA: Diagnosis not present

## 2021-07-02 DIAGNOSIS — Z955 Presence of coronary angioplasty implant and graft: Secondary | ICD-10-CM | POA: Insufficient documentation

## 2021-07-02 DIAGNOSIS — Z89511 Acquired absence of right leg below knee: Secondary | ICD-10-CM | POA: Insufficient documentation

## 2021-07-02 DIAGNOSIS — N185 Chronic kidney disease, stage 5: Secondary | ICD-10-CM

## 2021-07-02 LAB — BASIC METABOLIC PANEL
Anion gap: 12 (ref 5–15)
BUN: 53 mg/dL — ABNORMAL HIGH (ref 6–20)
CO2: 26 mmol/L (ref 22–32)
Calcium: 8.9 mg/dL (ref 8.9–10.3)
Chloride: 95 mmol/L — ABNORMAL LOW (ref 98–111)
Creatinine, Ser: 2.07 mg/dL — ABNORMAL HIGH (ref 0.44–1.00)
GFR, Estimated: 27 mL/min — ABNORMAL LOW (ref >60)
Glucose, Bld: 274 mg/dL — ABNORMAL HIGH (ref 70–99)
Potassium: 3.9 mmol/L (ref 3.5–5.1)
Sodium: 133 mmol/L — ABNORMAL LOW (ref 135–145)

## 2021-07-02 LAB — CBC
HCT: 30.6 % — ABNORMAL LOW (ref 36.0–46.0)
Hemoglobin: 9.4 g/dL — ABNORMAL LOW (ref 12.0–15.0)
MCH: 26.6 pg (ref 26.0–34.0)
MCHC: 30.7 g/dL (ref 30.0–36.0)
MCV: 86.4 fL (ref 80.0–100.0)
Platelets: 288 10*3/uL (ref 150–400)
RBC: 3.54 MIL/uL — ABNORMAL LOW (ref 3.87–5.11)
RDW: 13.6 % (ref 11.5–15.5)
WBC: 12.7 10*3/uL — ABNORMAL HIGH (ref 4.0–10.5)
nRBC: 0 % (ref 0.0–0.2)

## 2021-07-02 LAB — BRAIN NATRIURETIC PEPTIDE: B Natriuretic Peptide: 128.8 pg/mL — ABNORMAL HIGH (ref 0.0–100.0)

## 2021-07-02 NOTE — Addendum Note (Signed)
Encounter addended by: Kerry Dory, CMA on: 07/02/2021 2:41 PM  Actions taken: Clinical Note Signed

## 2021-07-02 NOTE — Addendum Note (Signed)
Encounter addended by: Joette Catching, PA-C on: 07/02/2021 4:04 PM  Actions taken: Clinical Note Signed, Result note filed

## 2021-07-02 NOTE — Progress Notes (Signed)
CardioMems tech support number [phone]) given to [facility]  with [facility] and detailed message left for patient.

## 2021-07-02 NOTE — Addendum Note (Signed)
Encounter addended by: Joette Catching, PA-C on: 07/02/2021 1:39 PM  Actions taken: Clinical Note Signed

## 2021-07-02 NOTE — Patient Instructions (Addendum)
Please do daily weights and call if weight gain >3 lbs in 1 day or 5 lbs in a week  Please follow up with cardio mems tech support to trouble shoot problems with pillow.  Our rep will call you to provide you with the tech support number.   Labs today We will only contact you if something comes back abnormal or we need to make some changes. Otherwise no news is good news!   Your physician recommends that you schedule a follow-up appointment in: 2 weeks with the Nurse Practitioner/Physician Assistant  Please call office at [phone] option [phone] if you have any questions or concerns.   At the [facility], you and your health needs are our priority. As part of our continuing mission to provide you with exceptional heart care, we have created designated Provider Care Teams. These Care Teams include your primary Cardiologist (physician) and Advanced Practice Providers (APPs- Physician Assistants and Nurse Practitioners) who all work together to provide you with the care you need, when you need it.   You may see any of the following providers on your designated Care Team at your next follow up: Dr Glori Bickers Dr Haynes Kerns, NP Lyda Jester, [place] [facility] [place], [place] Audry Riles, PharmD   Please be sure to bring in all your medications bottles to every appointment.

## 2021-07-14 NOTE — Progress Notes (Addendum)
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  Several hospitalizations at [facility] for acute on chronic respiratory failure, bilateral pleural effusions and acute diastolic HF (7/67/34-09/01/35 and 10/01/19-10/06/19)  + thoracentesis 10/04/19 of 1 L (patient had had 4 other thoracenteses in the prior year).   Admitted 10/21/19 to [facility] with prolonged hospitalization due to COVID-19 PNA, respiratory failure, A/C diastolic HF and CAD with PCI to LAD and RCA. Also had Cardiomems placed given recurrent diastolic CHF.  She lives at [facility].    She returned 4/22 for f/u. PFTs reordered to investigate her lung disease.   Cardiomems (11/26/20) PADP 8; deactivated 03/16/21. 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  Echo 03/17/21: EF 60-65%, RV normal.  Admitted 06/17/2021 with acute on chronic hypoxemic respiratory failure secondary to a/c diastolic HF and recurrent L pleural effusion. Pulmonary did not recommend thoracentesis, had significant pain with procedure in the past. Diuresed well with IV lasix. Weight 87.7 kg.   Seen for hospital f/u 11/10. Weight 190 lb. Volume appeared okay.   She is here today for CHF f/u. Reports more shortness of breath the last couple of days.  O2 sats stable on 3L [place] (usual requirement).  Also reports abdominal distension. No leg edema. Weight 194 lb. Denies CP or palpitations. Currently resides in a SNF and participating in PT.     ROS: All systems negative except as listed in HPI, PMH and Problem List.  SH:   Social History   Socioeconomic History   Marital status: Unknown    Spouse name: Not on file   Number of children: Not on file   Years of education: Not on file   Highest education level: Not on file  Occupational History   Not on file  Tobacco Use   Smoking status: Never   Smokeless tobacco: Never  Substance and Sexual Activity   Alcohol use: Never   Drug use: Never   Sexual activity: Not on file  Other Topics Concern   Not on file  Social History Narrative   Not on file   Social Determinants of Health   Financial Resource Strain: Not on file  Food Insecurity: Not on file  Transportation Needs: Not on file  Physical Activity: Not on file  Stress: Not on file  Social Connections: Not on file  Intimate Partner Violence: Not on file   FH:  Family History  Problem Relation Age of Onset   Hypertension Brother    Diabetes Brother    Hypertension Maternal Grandmother    Heart attack Paternal Grandmother    Current Outpatient Medications  Medication Sig Dispense Refill   acetaminophen (TYLENOL) 325 MG tablet Take 650 mg by mouth every 6 (six) hours as needed for headache (pain).     albuterol (PROVENTIL) (2.5 MG/3ML) 0.083% nebulizer solution Take 2.5 mg by nebulization every 4 (four) hours as needed for wheezing or shortness of breath.     albuterol (VENTOLIN HFA) 108 (90 Base) MCG/ACT inhaler Inhale 2 puffs into the lungs every 6 (six) hours as needed for wheezing or shortness of breath.  amLODipine (NORVASC) 10 MG tablet Take 1 tablet (10 mg total) by mouth daily. 30 tablet 0   aspirin EC 81 MG EC tablet Take 1 tablet (81 mg total) by mouth daily. 30 tablet 0   atorvastatin (LIPITOR) 80 MG tablet Take 1 tablet (80 mg total) by mouth at bedtime.     busPIRone (BUSPAR) 10 MG tablet Take 1 tablet (10 mg total) by mouth 3 (three) times daily.     cholecalciferol (VITAMIN D3) 25 MCG (1000 UNIT) tablet Take 1,000 Units by mouth daily.     clonazePAM (KLONOPIN) 0.25 MG  disintegrating tablet Take 1 tablet (0.25 mg total) by mouth 2 (two) times daily. 60 tablet 0   cloNIDine (CATAPRES) 0.3 MG tablet Take 1 tablet (0.3 mg total) by mouth 2 (two) times daily. 60 tablet 0   clopidogrel (PLAVIX) 75 MG tablet Take 75 mg by mouth daily.     dapagliflozin propanediol (FARXIGA) 10 MG TABS tablet Take 1 tablet (10 mg total) by mouth daily. 30 tablet    dicyclomine (BENTYL) 10 MG capsule Take 10 mg by mouth every 6 (six) hours as needed for spasms.     ferrous sulfate 325 (65 FE) MG tablet Take 325 mg by mouth 2 (two) times daily.     fluticasone-salmeterol (ADVAIR) 250-50 MCG/ACT AEPB Inhale 1 puff into the lungs in the morning and at bedtime.     gabapentin (NEURONTIN) 100 MG capsule Take 1 capsule (100 mg total) by mouth 3 (three) times daily.     Glucagon, rDNA, (GLUCAGON EMERGENCY) 1 MG KIT Inject 1 mg as directed See admin instructions. Inject 1 mg every 15 minutes as needed for blood sugar below 60     hydrALAZINE (APRESOLINE) 100 MG tablet Take 1 tablet (100 mg total) by mouth 3 (three) times daily.     insulin aspart (NOVOLOG) 100 UNIT/ML injection Inject 0-9 Units into the skin 3 (three) times daily with meals. (Patient taking differently: Inject 2-10 Units into the skin in the morning, at noon, in the evening, and at bedtime. Sliding scale :  200-249 = 2 units 250-299=4 units 300-349=6 units 350-399=8 units 400-449=10 units) 10 mL 11   insulin detemir (LEVEMIR) 100 UNIT/ML injection Inject 28 Units into the skin 2 (two) times daily.     isosorbide mononitrate (IMDUR) 120 MG 24 hr tablet Take 1 tablet (120 mg total) by mouth daily.     labetalol (NORMODYNE) 300 MG tablet Take 300 mg by mouth every 12 (twelve) hours.     Lactobacillus 0.2-0.2 MG TABS Take 1 tablet by mouth daily.     loperamide (IMODIUM A-D) 2 MG tablet Take 2 mg by mouth every 6 (six) hours as needed for diarrhea or loose stools.     magnesium oxide (MAG-OX) 400 MG tablet Take 400 mg by mouth 2  (two) times daily.     meclizine (ANTIVERT) 25 MG tablet Take 25 mg by mouth every 8 (eight) hours as needed for dizziness.     metoCLOPramide (REGLAN) 5 MG tablet Take 2.5 mg by mouth 3 (three) times daily before meals.     Multiple Vitamin (MULTIVITAMIN WITH MINERALS) TABS tablet Take 1 tablet by mouth daily. 30 tablet 0   nitroGLYCERIN (NITROSTAT) 0.4 MG SL tablet Place 0.4 mg under the tongue every 5 (five) minutes as needed for chest pain.     ondansetron (ZOFRAN) 4 MG tablet Take 4 mg by mouth every 6 (six) hours as needed for nausea or vomiting.  OXYGEN Inhale 3 L into the lungs continuous.     pantoprazole (PROTONIX) 40 MG tablet Take 40 mg by mouth daily.     polyethylene glycol (MIRALAX / GLYCOLAX) 17 g packet Take 17 g by mouth daily.     polyvinyl alcohol (LIQUIFILM TEARS) 1.4 % ophthalmic solution Place 1 drop into both eyes every 8 (eight) hours as needed for dry eyes.     sacubitril-valsartan (ENTRESTO) 24-26 MG Take 1 tablet by mouth 2 (two) times daily. 60 tablet    senna (SENOKOT) 8.6 MG TABS tablet Take 2 tablets (17.2 mg total) by mouth at bedtime. 120 tablet 0   spironolactone (ALDACTONE) 25 MG tablet Take 0.5 tablets (12.5 mg total) by mouth daily.     torsemide 60 MG TABS Take 60 mg by mouth daily.     venlafaxine XR (EFFEXOR-XR) 37.5 MG 24 hr capsule Take 3 capsules (112.5 mg total) by mouth daily with breakfast.     No current facility-administered medications for this visit.   There were no vitals taken for this visit.  Wt Readings from Last 3 Encounters:  07/02/21 86.5 kg (190 lb 9.6 oz)  06/25/21 87.7 kg (193 lb 5.5 oz)  12/15/20 77.4 kg (170 lb 9.6 oz)   General:  Comfortable on 3L O2 [place]. Appears older than stated age. HEENT: normal Neck: supple. + JVD. Carotids 2+ bilat; no bruits. No lymphadenopathy or thryomegaly appreciated. Cor: PMI nondisplaced. Regular rate & rhythm. No rubs, gallops or murmurs. Lungs: bibasilar crackles Abdomen: soft, nontender,  nondistended. No hepatosplenomegaly. No bruits or masses. Good bowel sounds. Extremities: no cyanosis, clubbing, rash, edema, s/p BKA on right Neuro: alert & orientedx3, cranial nerves grossly intact. moves all 4 extremities w/o difficulty. Tearful   ASSESSMENT & PLAN: Acute on chronic diastolic CHF: - Multiple admissions with hypertensive urgency/diastolic CHF. Echo in 2/21 with EF 55-60%, normal RV, IVC suggesting RA pressure 8 mmHg.  [place] 3/21 showed optimized filling pressures with normal cardiac output.  With multiple diastolic CHF admissions, we placed a Cardiomems device.  - Most recent admit 10/22 with a/c HF.  - NYHA III. Volume appears slightly up on exam.  Goal dPAP 11, 18 on 11/21. On 60 mg Torsemide daily. Will have her take an extra 40 mg Torsemide for three days.  - Needs to send cardiomems more regularly, discussed today - continue Spiro 12.5 mg daily - Continue entresto 24/26 mg BID - continue farxiga 10 mg daily - Continue imdur 120 mg/hydralazine 100 mg TID - Labs today 2. Chronic hypoxemic respiratory failure: 09/2019 COVID-19 viral pneumonia. She has been on home oxygen 3L [place]. Said to have COPD but has never smoked.   - Recent admit with a/c respiratory failure. In setting of heart failure exacerbation and recurrent pleural effusion. Improved w/ diuresis. Now on 3 liters which is her baseline.   - Pulmonary recommended not doing thoracentesis (had significant pain in the past with thoracentesis, effusion thought to be due to CHF) or pleur-x catheter.  3. Pleural effusions: 09/2019 Had thoracenteses at [facility] and again bilaterally at [facility] in 3/21. Transudates with negative cytology. Suspect due to CHF. Has recurrent L pleural effusion - Evaluated by PCCM, Advised against repeat thoracentesis. Pleurex cath not recommended 4. HTN: Renal artery dopplers in 3/21 with nonobstructive disease.  - Continue amlodipine 10 mg daily.  - Continue hydralazine to 100 mg tid.  - Continue  labetalol 300 mg bid.  - Continue clonidine 0.3 bid.   - Continue Imdur 120  mg daily  - Continue Entresto 24/26 bid.  - Continue spironolactone 12.5 daily.  - BP initially elevated, but at goal on recheck 5. CKD: Stage 3b/IV.  Suspect diabetic nephropathy. Baseline SCr historically ~2.0. BMET today. Referral to Nephrology today. 6. Carotid stenosis: recent doppler 8/22: 1-39% RICA stenosis and 46-96% LICA stenosis  7. Odynophagia: Also with GERD symptoms.  EGD (6/22) with no obvious esophageal mechanical obstruction, likely benign fundic gland polyps, cannot exclude esophageal dysmotility and gastroparesis. 8. DM: Per PCP. On Farxiga 9. CAD: In 3/21, she had PCI/orbital atherectomy/DES RCA and PCI/DES mid LAD. No intervention on moderate LCx disease. No chest pain.  - Continue Imdur 120 mg daily.  - Continue ASA 81 + Plavix 75.   - Continue atorvastatin 80 mg daily. Lipids checked (4/22). - Eventually, would transition from Plavix to rivaroxaban 2.5 mg bid given CAD and PAD.  10: PAD: S/p R BKA.   - Continue statin, ASA, Plavix.  - Arterial dopplers (5/22) showed mild left lower arterial disease.  11. Depression: Profound. Seen by psych during recent admit. Buspar and Effexor adjusted.  12. Anemia:  - Baseline Hgb 8-9. Likely anemia of chronic disease.  - Transfused with 1 U PRBCs during recent admit d/t hgb 7.5 - Hgb stable at 9.4 on 07/02/21 - CBC today  Followup 3 weeks with APP and 3 months with Dr. Aundra Dubin  [facility], Lynder Parents, PA-C 07/14/2021

## 2021-07-15 ENCOUNTER — Encounter (HOSPITAL_COMMUNITY): Payer: Self-pay

## 2021-07-15 ENCOUNTER — Ambulatory Visit (HOSPITAL_COMMUNITY)
Admission: RE | Admit: 2021-07-15 | Discharge: 2021-07-15 | Disposition: A | Discharge status: Home / Self Care | Payer: Medicare Other | Source: Ambulatory Visit | Attending: Internal Medicine | Admitting: Internal Medicine

## 2021-07-15 VITALS — BP 110/70 | HR 64 | Wt 194.0 lb

## 2021-07-15 DIAGNOSIS — Z9981 Dependence on supplemental oxygen: Secondary | ICD-10-CM | POA: Insufficient documentation

## 2021-07-15 DIAGNOSIS — I160 Hypertensive urgency: Secondary | ICD-10-CM | POA: Diagnosis not present

## 2021-07-15 DIAGNOSIS — N1832 Chronic kidney disease, stage 3b: Secondary | ICD-10-CM | POA: Diagnosis not present

## 2021-07-15 DIAGNOSIS — D649 Anemia, unspecified: Secondary | ICD-10-CM | POA: Insufficient documentation

## 2021-07-15 DIAGNOSIS — Z8616 Personal history of COVID-19: Secondary | ICD-10-CM | POA: Diagnosis not present

## 2021-07-15 DIAGNOSIS — Z955 Presence of coronary angioplasty implant and graft: Secondary | ICD-10-CM | POA: Diagnosis not present

## 2021-07-15 DIAGNOSIS — N184 Chronic kidney disease, stage 4 (severe): Secondary | ICD-10-CM | POA: Diagnosis not present

## 2021-07-15 DIAGNOSIS — I5032 Chronic diastolic (congestive) heart failure: Secondary | ICD-10-CM | POA: Diagnosis not present

## 2021-07-15 DIAGNOSIS — Z7982 Long term (current) use of aspirin: Secondary | ICD-10-CM | POA: Diagnosis not present

## 2021-07-15 DIAGNOSIS — J9611 Chronic respiratory failure with hypoxia: Secondary | ICD-10-CM | POA: Insufficient documentation

## 2021-07-15 DIAGNOSIS — K219 Gastro-esophageal reflux disease without esophagitis: Secondary | ICD-10-CM | POA: Diagnosis not present

## 2021-07-15 DIAGNOSIS — R1310 Dysphagia, unspecified: Secondary | ICD-10-CM | POA: Diagnosis not present

## 2021-07-15 DIAGNOSIS — Z7984 Long term (current) use of oral hypoglycemic drugs: Secondary | ICD-10-CM | POA: Insufficient documentation

## 2021-07-15 DIAGNOSIS — F32A Depression, unspecified: Secondary | ICD-10-CM | POA: Diagnosis not present

## 2021-07-15 DIAGNOSIS — R140 Abdominal distension (gaseous): Secondary | ICD-10-CM | POA: Insufficient documentation

## 2021-07-15 DIAGNOSIS — I2510 Atherosclerotic heart disease of native coronary artery without angina pectoris: Secondary | ICD-10-CM | POA: Insufficient documentation

## 2021-07-15 DIAGNOSIS — E1122 Type 2 diabetes mellitus with diabetic chronic kidney disease: Secondary | ICD-10-CM | POA: Diagnosis not present

## 2021-07-15 DIAGNOSIS — Z89511 Acquired absence of right leg below knee: Secondary | ICD-10-CM | POA: Insufficient documentation

## 2021-07-15 DIAGNOSIS — F419 Anxiety disorder, unspecified: Secondary | ICD-10-CM | POA: Insufficient documentation

## 2021-07-15 DIAGNOSIS — I779 Disorder of arteries and arterioles, unspecified: Secondary | ICD-10-CM | POA: Diagnosis not present

## 2021-07-15 DIAGNOSIS — I739 Peripheral vascular disease, unspecified: Secondary | ICD-10-CM | POA: Diagnosis not present

## 2021-07-15 DIAGNOSIS — Z794 Long term (current) use of insulin: Secondary | ICD-10-CM | POA: Insufficient documentation

## 2021-07-15 DIAGNOSIS — I10 Essential (primary) hypertension: Secondary | ICD-10-CM | POA: Diagnosis not present

## 2021-07-15 DIAGNOSIS — J440 Chronic obstructive pulmonary disease with acute lower respiratory infection: Secondary | ICD-10-CM | POA: Insufficient documentation

## 2021-07-15 DIAGNOSIS — E7800 Pure hypercholesterolemia, unspecified: Secondary | ICD-10-CM | POA: Insufficient documentation

## 2021-07-15 DIAGNOSIS — Z79899 Other long term (current) drug therapy: Secondary | ICD-10-CM | POA: Insufficient documentation

## 2021-07-15 DIAGNOSIS — I6523 Occlusion and stenosis of bilateral carotid arteries: Secondary | ICD-10-CM | POA: Diagnosis not present

## 2021-07-15 DIAGNOSIS — I130 Hypertensive heart and chronic kidney disease with heart failure and stage 1 through stage 4 chronic kidney disease, or unspecified chronic kidney disease: Secondary | ICD-10-CM | POA: Insufficient documentation

## 2021-07-15 DIAGNOSIS — J90 Pleural effusion, not elsewhere classified: Secondary | ICD-10-CM | POA: Insufficient documentation

## 2021-07-15 DIAGNOSIS — I5033 Acute on chronic diastolic (congestive) heart failure: Secondary | ICD-10-CM

## 2021-07-15 DIAGNOSIS — Z7902 Long term (current) use of antithrombotics/antiplatelets: Secondary | ICD-10-CM | POA: Diagnosis not present

## 2021-07-15 LAB — BASIC METABOLIC PANEL
Anion gap: 8 (ref 5–15)
BUN: 32 mg/dL — ABNORMAL HIGH (ref 6–20)
CO2: 25 mmol/L (ref 22–32)
Calcium: 8.6 mg/dL — ABNORMAL LOW (ref 8.9–10.3)
Chloride: 102 mmol/L (ref 98–111)
Creatinine, Ser: 1.89 mg/dL — ABNORMAL HIGH (ref 0.44–1.00)
GFR, Estimated: 30 mL/min — ABNORMAL LOW (ref >60)
Glucose, Bld: 138 mg/dL — ABNORMAL HIGH (ref 70–99)
Potassium: 4.7 mmol/L (ref 3.5–5.1)
Sodium: 135 mmol/L (ref 135–145)

## 2021-07-15 LAB — CBC
HCT: 29.3 % — ABNORMAL LOW (ref 36.0–46.0)
Hemoglobin: 9 g/dL — ABNORMAL LOW (ref 12.0–15.0)
MCH: 26.2 pg (ref 26.0–34.0)
MCHC: 30.7 g/dL (ref 30.0–36.0)
MCV: 85.4 fL (ref 80.0–100.0)
Platelets: 264 10*3/uL (ref 150–400)
RBC: 3.43 MIL/uL — ABNORMAL LOW (ref 3.87–5.11)
RDW: 14.6 % (ref 11.5–15.5)
WBC: 10 10*3/uL (ref 4.0–10.5)
nRBC: 0 % (ref 0.0–0.2)

## 2021-07-15 LAB — BRAIN NATRIURETIC PEPTIDE: B Natriuretic Peptide: 333.8 pg/mL — ABNORMAL HIGH (ref 0.0–100.0)

## 2021-07-15 NOTE — Progress Notes (Signed)
I called [place] [facility] and spoke with the nurse to review the increase in Jamie [name]'s Torsemide by 40 mg to equal 100 mg daily for the next 3 days.  She acknowledges understanding.

## 2021-07-15 NOTE — Patient Instructions (Addendum)
Please take Extra Torsemide 40 mg x 3days Labwork done today --we will call only with abnormal values F/U 3 weeks per appt. Referral sent to Nephrology -they will call you to schedule and appt

## 2021-07-23 ENCOUNTER — Encounter (HOSPITAL_COMMUNITY): Payer: Self-pay | Admitting: Emergency Medicine

## 2021-07-23 ENCOUNTER — Emergency Department (HOSPITAL_COMMUNITY)
Admission: EM | Admit: 2021-07-23 | Discharge: 2021-07-23 | Disposition: A | Discharge status: Home / Self Care | Payer: Medicare Other | Attending: Emergency Medicine | Admitting: Emergency Medicine

## 2021-07-23 ENCOUNTER — Emergency Department (HOSPITAL_COMMUNITY): Payer: Medicare Other

## 2021-07-23 ENCOUNTER — Other Ambulatory Visit: Payer: Self-pay

## 2021-07-23 DIAGNOSIS — I130 Hypertensive heart and chronic kidney disease with heart failure and stage 1 through stage 4 chronic kidney disease, or unspecified chronic kidney disease: Secondary | ICD-10-CM | POA: Diagnosis not present

## 2021-07-23 DIAGNOSIS — Z7982 Long term (current) use of aspirin: Secondary | ICD-10-CM | POA: Insufficient documentation

## 2021-07-23 DIAGNOSIS — D649 Anemia, unspecified: Secondary | ICD-10-CM

## 2021-07-23 DIAGNOSIS — Z20822 Contact with and (suspected) exposure to covid-19: Secondary | ICD-10-CM | POA: Insufficient documentation

## 2021-07-23 DIAGNOSIS — I5032 Chronic diastolic (congestive) heart failure: Secondary | ICD-10-CM | POA: Diagnosis not present

## 2021-07-23 DIAGNOSIS — N1832 Chronic kidney disease, stage 3b: Secondary | ICD-10-CM

## 2021-07-23 DIAGNOSIS — E119 Type 2 diabetes mellitus without complications: Secondary | ICD-10-CM | POA: Insufficient documentation

## 2021-07-23 DIAGNOSIS — Z7984 Long term (current) use of oral hypoglycemic drugs: Secondary | ICD-10-CM | POA: Insufficient documentation

## 2021-07-23 DIAGNOSIS — R4182 Altered mental status, unspecified: Secondary | ICD-10-CM | POA: Diagnosis not present

## 2021-07-23 DIAGNOSIS — Z7902 Long term (current) use of antithrombotics/antiplatelets: Secondary | ICD-10-CM | POA: Diagnosis not present

## 2021-07-23 DIAGNOSIS — R1084 Generalized abdominal pain: Secondary | ICD-10-CM

## 2021-07-23 DIAGNOSIS — Z79899 Other long term (current) drug therapy: Secondary | ICD-10-CM | POA: Insufficient documentation

## 2021-07-23 DIAGNOSIS — R112 Nausea with vomiting, unspecified: Secondary | ICD-10-CM

## 2021-07-23 DIAGNOSIS — Z794 Long term (current) use of insulin: Secondary | ICD-10-CM | POA: Insufficient documentation

## 2021-07-23 DIAGNOSIS — R404 Transient alteration of awareness: Secondary | ICD-10-CM

## 2021-07-23 LAB — URINALYSIS, ROUTINE W REFLEX MICROSCOPIC
Bilirubin Urine: NEGATIVE
Glucose, UA: >=500 mg/dL — AB
Hgb urine dipstick: NEGATIVE
Ketones, ur: NEGATIVE mg/dL
Leukocytes,Ua: NEGATIVE
Nitrite: NEGATIVE
Protein, ur: 100 mg/dL — AB
Specific Gravity, Urine: 1.009 (ref 1.005–1.030)
pH: 6 (ref 5.0–8.0)

## 2021-07-23 LAB — CBC WITH DIFFERENTIAL/PLATELET
Abs Immature Granulocytes: 0.04 10*3/uL (ref 0.00–0.07)
Basophils Absolute: 0 10*3/uL (ref 0.0–0.1)
Basophils Relative: 0 %
Eosinophils Absolute: 0.3 10*3/uL (ref 0.0–0.5)
Eosinophils Relative: 4 %
HCT: 29.4 % — ABNORMAL LOW (ref 36.0–46.0)
Hemoglobin: 9.1 g/dL — ABNORMAL LOW (ref 12.0–15.0)
Immature Granulocytes: 0 %
Lymphocytes Relative: 5 %
Lymphs Abs: 0.5 10*3/uL — ABNORMAL LOW (ref 0.7–4.0)
MCH: 26.3 pg (ref 26.0–34.0)
MCHC: 31 g/dL (ref 30.0–36.0)
MCV: 85 fL (ref 80.0–100.0)
Monocytes Absolute: 0.5 10*3/uL (ref 0.1–1.0)
Monocytes Relative: 5 %
Neutro Abs: 8.1 10*3/uL — ABNORMAL HIGH (ref 1.7–7.7)
Neutrophils Relative %: 86 %
Platelets: 223 10*3/uL (ref 150–400)
RBC: 3.46 MIL/uL — ABNORMAL LOW (ref 3.87–5.11)
RDW: 14.7 % (ref 11.5–15.5)
WBC: 9.5 10*3/uL (ref 4.0–10.5)
nRBC: 0 % (ref 0.0–0.2)

## 2021-07-23 LAB — COMPREHENSIVE METABOLIC PANEL
ALT: 14 U/L (ref 0–44)
AST: 14 U/L — ABNORMAL LOW (ref 15–41)
Albumin: 3.3 g/dL — ABNORMAL LOW (ref 3.5–5.0)
Alkaline Phosphatase: 77 U/L (ref 38–126)
Anion gap: 7 (ref 5–15)
BUN: 40 mg/dL — ABNORMAL HIGH (ref 6–20)
CO2: 28 mmol/L (ref 22–32)
Calcium: 8.7 mg/dL — ABNORMAL LOW (ref 8.9–10.3)
Chloride: 100 mmol/L (ref 98–111)
Creatinine, Ser: 1.85 mg/dL — ABNORMAL HIGH (ref 0.44–1.00)
GFR, Estimated: 31 mL/min — ABNORMAL LOW (ref >60)
Glucose, Bld: 195 mg/dL — ABNORMAL HIGH (ref 70–99)
Potassium: 4.8 mmol/L (ref 3.5–5.1)
Sodium: 135 mmol/L (ref 135–145)
Total Bilirubin: 0.4 mg/dL (ref 0.3–1.2)
Total Protein: 6.6 g/dL (ref 6.5–8.1)

## 2021-07-23 LAB — CBG MONITORING, ED
Glucose-Capillary: 183 mg/dL — ABNORMAL HIGH (ref 70–99)
Glucose-Capillary: 233 mg/dL — ABNORMAL HIGH (ref 70–99)

## 2021-07-23 LAB — RESP PANEL BY RT-PCR (FLU A&B, COVID) ARPGX2
Influenza A by PCR: NEGATIVE
Influenza B by PCR: NEGATIVE
SARS Coronavirus 2 by RT PCR: NEGATIVE

## 2021-07-23 LAB — AMMONIA: Ammonia: 15 umol/L (ref 9–35)

## 2021-07-23 MED ORDER — ONDANSETRON HCL 4 MG/2ML IJ SOLN
4.0000 mg | Freq: Once | INTRAMUSCULAR | Status: AC
  Administered 2021-07-23: 4 mg via INTRAVENOUS
  Filled 2021-07-23: qty 2

## 2021-07-23 MED ORDER — ONDANSETRON 4 MG PO TBDP
4.0000 mg | ORAL_TABLET | Freq: Three times a day (TID) | ORAL | 0 refills | Status: DC | PRN
Start: 2021-07-23 — End: 2021-08-19

## 2021-07-23 NOTE — ED Notes (Signed)
RN charted on wrong patient. Patient is going home. Report was called on a different patient

## 2021-07-23 NOTE — ED Notes (Signed)
Pt placed on purewick 

## 2021-07-23 NOTE — ED Notes (Signed)
[facility] contacted for transport back to [facility]

## 2021-07-23 NOTE — ED Triage Notes (Signed)
Pt BIB [facility], facility reports she was normal at lunch, then when they went to check her CBG after she was not responding and not following commands. By arrival, pt more alert, answering questions and following commands. C/o headache, epigastric pain, vomiting. Hx CHF, EMS reports similar episode in October, reports noncompliance with diet and fluid restrictions. 20ga LH  160/82 HR 68 100% on 3L baseline

## 2021-07-23 NOTE — ED Notes (Signed)
[place] to give report. No answer

## 2021-07-23 NOTE — ED Provider Notes (Signed)
[facility] DEPT Provider Note   CSN: [id number] Arrival date & time: 07/23/21  1239     History Chief Complaint  Patient presents with   Altered Mental Status    Jamie [name] is a 60 y.o. female.  Pt presents to the ED today with AMS.  The pt was found in her room with decreased responsiveness.  Pt is now awake and alert.  She did vomit a few times en route and c/o abd pain.  No fevers.      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    CHF (congestive heart failure) (HCC)    Chronic diastolic HF (heart failure) ([place]) 10/21/2019   COPD (chronic obstructive pulmonary disease) (HCC)    Diabetes type 2, uncontrolled    Gastritis and duodenitis    HTN (hypertension) 10/21/2019   PAD (peripheral artery disease) ([place]) 10/21/2019   PAD (peripheral artery disease) (HCC)    PNA (pneumonia)    TIA (transient ischemic attack)     Patient Active Problem List   Diagnosis Date Noted   Major depressive disorder, single episode, moderate ([place]) 06/20/2021   Shortness of breath 06/17/2021   Recurrent left pleural effusion 06/17/2021   Coronary artery disease 06/17/2021   Palliative care by specialist    Acute renal failure [facility])    DNR (do not resuscitate) discussion    Acute respiratory insufficiency    Chronic diastolic HF (heart failure) ([place]) 10/21/2019   HTN (hypertension) 10/21/2019   PAD (peripheral artery disease) ([place]) 10/21/2019   CHF (congestive heart failure) ([place]) 94/80/1655   Acute diastolic CHF (congestive heart failure) ([place])     Past Surgical History:  Procedure Laterality Date   BELOW KNEE LEG AMPUTATION Right    CORONARY ATHERECTOMY N/A 11/02/2019   Procedure: CORONARY ATHERECTOMY;  Surgeon: Martinique, Peter M, MD;  Location: [facility];  Service: Cardiovascular;  Laterality: N/A;   CORONARY STENT INTERVENTION N/A 11/02/2019   Procedure: CORONARY STENT INTERVENTION;  Surgeon: Martinique, Peter M, MD;   Location: [facility];  Service: Cardiovascular;  Laterality: N/A;   PRESSURE SENSOR/CARDIOMEMS N/A 10/31/2019   Procedure: PRESSURE SENSOR/CARDIOMEMS;  Surgeon: Larey Dresser, MD;  Location: [facility];  Service: Cardiovascular;  Laterality: N/A;   RIGHT/LEFT HEART CATH AND CORONARY ANGIOGRAPHY N/A 10/31/2019   Procedure: RIGHT/LEFT HEART CATH AND CORONARY ANGIOGRAPHY;  Surgeon: Larey Dresser, MD;  Location: [facility];  Service: Cardiovascular;  Laterality: N/A;     OB History   No obstetric history on file.     Family History  Problem Relation Age of Onset   Hypertension Brother    Diabetes Brother    Hypertension Maternal Grandmother    Heart attack Paternal Grandmother     Social History   Tobacco Use   Smoking status: Never   Smokeless tobacco: Never  Substance Use Topics   Alcohol use: Never   Drug use: Never    Home Medications Prior to Admission medications   Medication Sig Start Date End Date Taking? Authorizing Provider  acetaminophen (TYLENOL) 325 MG tablet Take 650 mg by mouth every 6 (six) hours as needed for headache (or pain).   Yes [provider]  albuterol (PROVENTIL) (2.5 MG/3ML) 0.083% nebulizer solution Take 2.5 mg by nebulization every 4 (four) hours as needed for wheezing or shortness of breath.   Yes [provider]  albuterol (VENTOLIN HFA) 108 (90 Base) MCG/ACT inhaler  Inhale 2 puffs into the lungs every 6 (six) hours as needed for wheezing or shortness of breath.   Yes [provider]  amLODipine (NORVASC) 10 MG tablet Take 1 tablet (10 mg total) by mouth daily. 11/07/19  Yes Dana Allan I, MD  aspirin EC 81 MG EC tablet Take 1 tablet (81 mg total) by mouth daily. 11/07/19  Yes Dana Allan I, MD  atorvastatin (LIPITOR) 80 MG tablet Take 1 tablet (80 mg total) by mouth at bedtime. 06/25/21  Yes Alcus Dad, MD  busPIRone (BUSPAR) 10 MG tablet Take 1 tablet (10 mg total) by mouth 3  (three) times daily. 06/25/21  Yes Alcus Dad, MD  cholecalciferol (VITAMIN D3) 25 MCG (1000 UNIT) tablet Take 1,000 Units by mouth daily.   Yes [provider]  clonazePAM (KLONOPIN) 0.25 MG disintegrating tablet Take 1 tablet (0.25 mg total) by mouth 2 (two) times daily. Patient taking differently: Take 0.25 mg by mouth in the morning and at bedtime. 11/06/19  Yes Dana Allan I, MD  cloNIDine (CATAPRES) 0.3 MG tablet Take 1 tablet (0.3 mg total) by mouth 2 (two) times daily. Patient taking differently: Take 0.3 mg by mouth in the morning and at bedtime. 11/06/19  Yes Dana Allan I, MD  clopidogrel (PLAVIX) 75 MG tablet Take 75 mg by mouth daily.   Yes [provider]  dapagliflozin propanediol (FARXIGA) 10 MG TABS tablet Take 1 tablet (10 mg total) by mouth daily. 06/25/21  Yes Alcus Dad, MD  dicyclomine (BENTYL) 10 MG capsule Take 10 mg by mouth every 6 (six) hours as needed for spasms.   Yes [provider]  ferrous sulfate 325 (65 FE) MG tablet Take 325 mg by mouth in the morning and at bedtime.   Yes [provider]  fluticasone-salmeterol (ADVAIR) 250-50 MCG/ACT AEPB Inhale 1 puff into the lungs in the morning and at bedtime.   Yes [provider]  gabapentin (NEURONTIN) 100 MG capsule Take 1 capsule (100 mg total) by mouth 3 (three) times daily. 06/25/21  Yes Alcus Dad, MD  Glucagon, rDNA, (GLUCAGON EMERGENCY) 1 MG KIT Inject 1 mg as directed See admin instructions. Inject 1 mg into the skin every 15 minutes as needed for blood sugar below 60   Yes [provider]  hydrALAZINE (APRESOLINE) 100 MG tablet Take 1 tablet (100 mg total) by mouth 3 (three) times daily. 06/25/21  Yes Alcus Dad, MD  insulin aspart (NOVOLOG) 100 UNIT/ML injection Inject 0-9 Units into the skin 3 (three) times daily with meals. Patient taking differently: Inject 1-9 Units into the skin in the morning, at noon, in the evening, and at  bedtime. Inject into the skin 3 times a day before meals, per sliding scale: BGL 201-250 = 1 unit; 251-300 = 3 units; 301-350 = 5 units; 351-400 = 7 units; 401-450 = 9 units; >450 = CALL MD 11/06/19  Yes Dana Allan I, MD  insulin detemir (LEVEMIR) 100 UNIT/ML injection Inject 28 Units into the skin in the morning and at bedtime.   Yes [provider]  isosorbide mononitrate (IMDUR) 120 MG 24 hr tablet Take 1 tablet (120 mg total) by mouth daily. 06/26/21  Yes Alcus Dad, MD  labetalol (NORMODYNE) 300 MG tablet Take 300 mg by mouth in the morning and at bedtime.   Yes [provider]  LACTOBACILLUS PO Take 1 capsule by mouth in the morning.   Yes [provider]  loperamide (IMODIUM A-D) 2 MG tablet Take 2  mg by mouth every 6 (six) hours as needed (for loose stools).   Yes [provider]  magnesium oxide (MAG-OX) 400 MG tablet Take 400 mg by mouth in the morning and at bedtime.   Yes [provider]  meclizine (ANTIVERT) 25 MG tablet Take 25 mg by mouth every 8 (eight) hours as needed for dizziness.   Yes [provider]  metoCLOPramide (REGLAN) 5 MG tablet Take 2.5 mg by mouth 3 (three) times daily before meals.   Yes [provider]  Multiple Vitamin (MULTIVITAMIN WITH MINERALS) TABS tablet Take 1 tablet by mouth daily. 11/07/19  Yes Bonnell Public, MD  nitroGLYCERIN (NITROSTAT) 0.4 MG SL tablet Place 0.4 mg under the tongue every 5 (five) minutes x 3 doses as needed for chest pain.   Yes [provider]  ondansetron (ZOFRAN) 4 MG tablet Take 4 mg by mouth every 6 (six) hours as needed for nausea or vomiting.   Yes [provider]  ondansetron (ZOFRAN-ODT) 4 MG disintegrating tablet Take 1 tablet (4 mg total) by mouth every 8 (eight) hours as needed for nausea or vomiting. 07/23/21  Yes Isla Pence, MD  OXYGEN Inhale 3 L/min into the lungs continuous.   Yes [provider]  pantoprazole  (PROTONIX) 40 MG tablet Take 40 mg by mouth daily.   Yes [provider]  polyethylene glycol (MIRALAX / GLYCOLAX) 17 g packet Take 17 g by mouth daily.   Yes [provider]  polyvinyl alcohol (LIQUIFILM TEARS) 1.4 % ophthalmic solution Place 1 drop into both eyes every 8 (eight) hours as needed for dry eyes.   Yes [provider]  sacubitril-valsartan (ENTRESTO) 24-26 MG Take 1 tablet by mouth 2 (two) times daily. Patient taking differently: Take 1 tablet by mouth in the morning and at bedtime. 06/25/21  Yes Alcus Dad, MD  senna (SENOKOT) 8.6 MG TABS tablet Take 2 tablets (17.2 mg total) by mouth at bedtime. Patient taking differently: Take 1-2 tablets by mouth See admin instructions. Take 2 tablets by mouth at bedtime and an additional 1 tablet every 12 hours as needed for constipation 06/25/21  Yes Alcus Dad, MD  spironolactone (ALDACTONE) 25 MG tablet Take 0.5 tablets (12.5 mg total) by mouth daily. 06/26/21  Yes Alcus Dad, MD  torsemide 60 MG TABS Take 60 mg by mouth daily. 06/26/21  Yes Alcus Dad, MD  venlafaxine XR (EFFEXOR-XR) 37.5 MG 24 hr capsule Take 3 capsules (112.5 mg total) by mouth daily with breakfast. 06/26/21  Yes Alcus Dad, MD    Allergies    Codeine, Meperidine and related, Tetracyclines & related, Tuberculin tests, and Demerol [meperidine hcl]  Review of Systems   Review of Systems  Gastrointestinal:  Positive for abdominal pain, nausea and vomiting.  All other systems reviewed and are negative.  Physical Exam Updated Vital Signs BP (!) 161/66   Pulse 65   Temp 98 F (36.7 C) (Oral)   Resp 12   Ht 5' 3"  (1.6 m)   Wt 89.4 kg   SpO2 100%   BMI 34.90 kg/m   Physical Exam Vitals and nursing note reviewed.  Constitutional:      Appearance: Normal appearance. She is obese.  HENT:     Head: Normocephalic and atraumatic.     Right Ear: External ear normal.     Left Ear: External ear normal.     Nose: Nose  normal.     Mouth/Throat:     Mouth: Mucous membranes are  moist.     Pharynx: Oropharynx is clear.  Eyes:     Extraocular Movements: Extraocular movements intact.     Conjunctiva/sclera: Conjunctivae normal.     Pupils: Pupils are equal, round, and reactive to light.  Cardiovascular:     Rate and Rhythm: Normal rate and regular rhythm.     Pulses: Normal pulses.     Heart sounds: Normal heart sounds.  Pulmonary:     Effort: Pulmonary effort is normal.     Breath sounds: Normal breath sounds.  Abdominal:     General: Abdomen is flat. Bowel sounds are normal.     Palpations: Abdomen is soft.     Tenderness: There is generalized abdominal tenderness.  Musculoskeletal:        General: Normal range of motion.     Cervical back: Normal range of motion and neck supple.     Comments: Right bka  Skin:    General: Skin is warm.     Capillary Refill: Capillary refill takes less than 2 seconds.  Neurological:     General: No focal deficit present.     Mental Status: She is alert and oriented to person, place, and time.  Psychiatric:        Mood and Affect: Mood normal.        Behavior: Behavior normal.        Thought Content: Thought content normal.        Judgment: Judgment normal.    ED Results / Procedures / Treatments   Labs (all labs ordered are listed, but only abnormal results are displayed) Labs Reviewed  CBC WITH DIFFERENTIAL/PLATELET - Abnormal; Notable for the following components:      Result Value   RBC 3.46 (*)    Hemoglobin 9.1 (*)    HCT 29.4 (*)    Neutro Abs 8.1 (*)    Lymphs Abs 0.5 (*)    All other components within normal limits  COMPREHENSIVE METABOLIC PANEL - Abnormal; Notable for the following components:   Glucose, Bld 195 (*)    BUN 40 (*)    Creatinine, Ser 1.85 (*)    Calcium 8.7 (*)    Albumin 3.3 (*)    AST 14 (*)    GFR, Estimated 31 (*)    All other components within normal limits  URINALYSIS, ROUTINE W REFLEX MICROSCOPIC - Abnormal;  Notable for the following components:   Glucose, UA >=500 (*)    Protein, ur 100 (*)    Bacteria, UA RARE (*)    All other components within normal limits  CBG MONITORING, ED - Abnormal; Notable for the following components:   Glucose-Capillary 183 (*)    All other components within normal limits  RESP PANEL BY RT-PCR (FLU A&B, COVID) [id number]  [facility]    EKG EKG Interpretation  Date/Time:  Thursday July 23 2021 14:00:06 EST Ventricular Rate:  65 PR Interval:  167 QRS Duration: 97 QT Interval:  425 QTC Calculation: 442 R Axis:   61 Text Interpretation: Sinus rhythm Anteroseptal infarct, old No significant change since last tracing Confirmed by Isla Pence [phone]) on 07/23/2021 2:19:16 PM  Radiology CT ABDOMEN PELVIS WO CONTRAST  Result Date: 07/23/2021 CLINICAL DATA:  Abdominal pain. EXAM: CT ABDOMEN AND PELVIS WITHOUT CONTRAST TECHNIQUE: Multidetector CT imaging of the abdomen and pelvis was performed following the standard protocol without IV contrast. COMPARISON:  03/19/2020 FINDINGS: Lower chest: Small to moderate right and small left pleural effusions. There is associated dependent lung base opacity  consistent with atelectasis. There is a rounded area of lung opacity in the left upper lobe lingula, also likely atelectasis. Hepatobiliary: No focal liver abnormality is seen. No gallstones, gallbladder wall thickening, or biliary dilatation. Pancreas: Unremarkable. No pancreatic ductal dilatation or surrounding inflammatory changes. Spleen: Normal in size without focal abnormality. Adrenals/Urinary Tract: Adrenal glands are unremarkable. Kidneys are normal, without renal calculi, focal lesion, or hydronephrosis. Bladder is unremarkable. Stomach/Bowel: Normal stomach. Small bowel and colon are normal in caliber. No wall thickening. No inflammation. Mild generalized increase in the colonic stool burden. No evidence of appendicitis. Vascular/Lymphatic: Dense aortic and branch vessel  atherosclerotic calcifications. No aneurysm. No enlarged lymph nodes. Reproductive: Uterine calcifications consistent fibroids. No adnexal masses. No change from the prior CT. Other: No abdominal wall hernia or abnormality. No abdominopelvic ascites. Musculoskeletal: Chronic compression fracture of L1 stable from prior CT. No acute fracture. No osteoblastic or osteolytic lesions. IMPRESSION: 1. No acute findings within the abdomen or pelvis. 2. Mild generalized increase in the colonic stool burden. 3. Dense aortic and branch vessel atherosclerotic calcifications. 4. Small to moderate right and small left pleural effusions associated with significant dependent lung base atelectasis. Electronically Signed   By: Lajean Manes M.D.   On: 07/23/2021 16:24   CT Head Wo Contrast  Result Date: 07/23/2021 CLINICAL DATA:  Altered mental status. EXAM: CT HEAD WITHOUT CONTRAST TECHNIQUE: Contiguous axial images were obtained from the base of the skull through the vertex without intravenous contrast. COMPARISON:  February 25, 2020. FINDINGS: Brain: No evidence of acute infarction, hemorrhage, hydrocephalus, extra-axial collection or mass lesion/mass effect. Vascular: No hyperdense vessel or unexpected calcification. Skull: Normal. Negative for fracture or focal lesion. Sinuses/Orbits: No acute finding. Other: None. IMPRESSION: No acute intracranial abnormality seen. Electronically Signed   By: Marijo Conception M.D.   On: 07/23/2021 16:23   DG Chest Portable 1 View  Result Date: 07/23/2021 CLINICAL DATA:  Altered mental status EXAM: PORTABLE CHEST 1 VIEW COMPARISON:  Portable exam 1431 hours compared to 06/18/2021 FINDINGS: Normal heart size, mediastinal contours, and pulmonary vascularity. Atherosclerotic calcification aorta. Bibasilar effusions and atelectasis, greater on LEFT. Improved pulmonary infiltrates. No new infiltrate or pneumothorax. Bones demineralized. IMPRESSION: Bibasilar pleural effusions and atelectasis LEFT  greater than RIGHT. Improved pulmonary infiltrates. Aortic Atherosclerosis (ICD10-I70.0). Electronically Signed   By: Lavonia Dana M.D.   On: 07/23/2021 14:38    Procedures Procedures   Medications Ordered in ED Medications  ondansetron (ZOFRAN) injection 4 mg (4 mg Intravenous Given 07/23/21 1351)    ED Course  I have reviewed the triage vital signs and the nursing notes.  Pertinent labs & imaging results that were available during my care of the patient were reviewed by me and considered in my medical decision making (see chart for details).    MDM Rules/Calculators/A&P                           It is not entirely clear what happened to pt.  However, she feels back to normal.    She feels much better and is tolerating po fluids.  Covid/flu neg.  CT head/abd/pelvis without anything acute.  Labs appear chronic.  Pt is stable for d/c.  She is to return if worse.  F/u with pcp. Final Clinical Impression(s) / ED Diagnoses Final diagnoses:  Transient alteration of awareness  Generalized abdominal pain  Nausea and vomiting, unspecified vomiting type  Chronic anemia  Stage 3b chronic kidney disease ([place])  Rx / DC Orders ED Discharge Orders          Ordered    ondansetron (ZOFRAN-ODT) 4 MG disintegrating tablet  Every 8 hours PRN        07/23/21 1631             Isla Pence, MD 07/23/21 1642

## 2021-08-05 NOTE — Progress Notes (Incomplete)
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  Several hospitalizations at [facility] for acute on chronic respiratory failure, bilateral pleural effusions and acute diastolic HF (6/77/03-4/0/35 and 10/01/19-10/06/19)  + thoracentesis 10/04/19 of 1 L (patient had had 4 other thoracenteses in the prior year).   Admitted 10/21/19 to [facility] with prolonged hospitalization due to COVID-19 PNA, respiratory failure, A/C diastolic HF and CAD with PCI to LAD and RCA. Also had Cardiomems placed given recurrent diastolic CHF.  She [facility] at [facility].    She returned 4/22 for f/u. PFTs reordered to investigate her lung disease.   Cardiomems (11/26/20) PADP 8; deactivated 03/16/21. 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  Echo 03/17/21: EF 60-65%, RV normal.  Admitted 06/17/2021 with acute on chronic hypoxemic respiratory failure secondary to a/c diastolic HF and recurrent L pleural effusion. Pulmonary did not recommend thoracentesis, had significant pain with procedure in the past. Diuresed well with IV lasix. Weight 87.7 kg.   Seen for hospital f/u 11/10. Weight 190 lb. Volume appeared okay.   She is here today for CHF f/u. Reports more shortness of breath the last couple of days.  O2 sats stable on 3L [place] (usual requirement).  Also reports abdominal distension. No leg edema. Weight 194 lb. Denies CP or palpitations. Currently resides in a SNF and participating in PT.     ROS: All systems negative except as listed in HPI, PMH and Problem List.  SH:   Social History   Socioeconomic History   Marital status: Unknown    Spouse name: Not on file   Number of children: Not on file   Years of education: Not on file   Highest education level: Not on file  Occupational History   Not on file  Tobacco Use   Smoking status: Never   Smokeless tobacco: Never  Substance and Sexual Activity   Alcohol use: Never   Drug use: Never   Sexual activity: Not on file  Other Topics Concern   Not on file  Social History Narrative   Not on file   Social Determinants of Health   Financial Resource Strain: Not on file  Food Insecurity: Not on file  Transportation Needs: Not on file  Physical Activity: Not on file  Stress: Not on file  Social Connections: Not on file  Intimate Partner Violence: Not on file   FH:  Family History  Problem Relation Age of Onset   Hypertension Brother    Diabetes Brother    Hypertension Maternal Grandmother    Heart attack Paternal Grandmother    Current Outpatient Medications  Medication Sig Dispense Refill   acetaminophen (TYLENOL) 325 MG tablet Take 650 mg by mouth every 6 (six) hours as needed for headache (or pain).     albuterol (PROVENTIL) (2.5 MG/3ML) 0.083% nebulizer solution Take 2.5 mg by nebulization every 4 (four) hours as needed for wheezing or shortness of breath.     albuterol (VENTOLIN HFA) 108 (90 Base) MCG/ACT inhaler Inhale 2 puffs into the lungs every 6 (six) hours as needed for wheezing or shortness of  breath.     amLODipine (NORVASC) 10 MG tablet Take 1 tablet (10 mg total) by mouth daily. 30 tablet 0   aspirin EC 81 MG EC tablet Take 1 tablet (81 mg total) by mouth daily. 30 tablet 0   atorvastatin (LIPITOR) 80 MG tablet Take 1 tablet (80 mg total) by mouth at bedtime.     busPIRone (BUSPAR) 10 MG tablet Take 1 tablet (10 mg total) by mouth 3 (three) times daily.     cholecalciferol (VITAMIN D3) 25 MCG (1000 UNIT) tablet Take 1,000 Units by mouth daily.     clonazePAM (KLONOPIN) 0.25 MG  disintegrating tablet Take 1 tablet (0.25 mg total) by mouth 2 (two) times daily. (Patient taking differently: Take 0.25 mg by mouth in the morning and at bedtime.) 60 tablet 0   cloNIDine (CATAPRES) 0.3 MG tablet Take 1 tablet (0.3 mg total) by mouth 2 (two) times daily. (Patient taking differently: Take 0.3 mg by mouth in the morning and at bedtime.) 60 tablet 0   clopidogrel (PLAVIX) 75 MG tablet Take 75 mg by mouth daily.     dapagliflozin propanediol (FARXIGA) 10 MG TABS tablet Take 1 tablet (10 mg total) by mouth daily. 30 tablet    dicyclomine (BENTYL) 10 MG capsule Take 10 mg by mouth every 6 (six) hours as needed for spasms.     ferrous sulfate 325 (65 FE) MG tablet Take 325 mg by mouth in the morning and at bedtime.     fluticasone-salmeterol (ADVAIR) 250-50 MCG/ACT AEPB Inhale 1 puff into the lungs in the morning and at bedtime.     gabapentin (NEURONTIN) 100 MG capsule Take 1 capsule (100 mg total) by mouth 3 (three) times daily.     Glucagon, rDNA, (GLUCAGON EMERGENCY) 1 MG KIT Inject 1 mg as directed See admin instructions. Inject 1 mg into the skin every 15 minutes as needed for blood sugar below 60     hydrALAZINE (APRESOLINE) 100 MG tablet Take 1 tablet (100 mg total) by mouth 3 (three) times daily.     insulin aspart (NOVOLOG) 100 UNIT/ML injection Inject 0-9 Units into the skin 3 (three) times daily with meals. (Patient taking differently: Inject 1-9 Units into the skin in the morning, at noon, in the evening, and at bedtime. Inject into the skin 3 times a day before meals, per sliding scale: BGL 201-250 = 1 unit; 251-300 = 3 units; 301-350 = 5 units; 351-400 = 7 units; 401-450 = 9 units; >450 = CALL MD) 10 mL 11   insulin detemir (LEVEMIR) 100 UNIT/ML injection Inject 28 Units into the skin in the morning and at bedtime.     isosorbide mononitrate (IMDUR) 120 MG 24 hr tablet Take 1 tablet (120 mg total) by mouth daily.     labetalol (NORMODYNE) 300 MG tablet Take 300 mg by mouth in  the morning and at bedtime.     LACTOBACILLUS PO Take 1 capsule by mouth in the morning.     loperamide (IMODIUM A-D) 2 MG tablet Take 2 mg by mouth every 6 (six) hours as needed (for loose stools).     magnesium oxide (MAG-OX) 400 MG tablet Take 400 mg by mouth in the morning and at bedtime.     meclizine (ANTIVERT) 25 MG tablet Take 25 mg by mouth every 8 (eight) hours as needed for dizziness.     metoCLOPramide (REGLAN) 5 MG tablet Take 2.5 mg by mouth 3 (three) times daily before meals.  Multiple Vitamin (MULTIVITAMIN WITH MINERALS) TABS tablet Take 1 tablet by mouth daily. 30 tablet 0   nitroGLYCERIN (NITROSTAT) 0.4 MG SL tablet Place 0.4 mg under the tongue every 5 (five) minutes x 3 doses as needed for chest pain.     ondansetron (ZOFRAN) 4 MG tablet Take 4 mg by mouth every 6 (six) hours as needed for nausea or vomiting.     ondansetron (ZOFRAN-ODT) 4 MG disintegrating tablet Take 1 tablet (4 mg total) by mouth every 8 (eight) hours as needed for nausea or vomiting. 20 tablet 0   OXYGEN Inhale 3 L/min into the lungs continuous.     pantoprazole (PROTONIX) 40 MG tablet Take 40 mg by mouth daily.     polyethylene glycol (MIRALAX / GLYCOLAX) 17 g packet Take 17 g by mouth daily.     polyvinyl alcohol (LIQUIFILM TEARS) 1.4 % ophthalmic solution Place 1 drop into both eyes every 8 (eight) hours as needed for dry eyes.     sacubitril-valsartan (ENTRESTO) 24-26 MG Take 1 tablet by mouth 2 (two) times daily. (Patient taking differently: Take 1 tablet by mouth in the morning and at bedtime.) 60 tablet    senna (SENOKOT) 8.6 MG TABS tablet Take 2 tablets (17.2 mg total) by mouth at bedtime. (Patient taking differently: Take 1-2 tablets by mouth See admin instructions. Take 2 tablets by mouth at bedtime and an additional 1 tablet every 12 hours as needed for constipation) 120 tablet 0   spironolactone (ALDACTONE) 25 MG tablet Take 0.5 tablets (12.5 mg total) by mouth daily.     torsemide 60 MG TABS  Take 60 mg by mouth daily.     venlafaxine XR (EFFEXOR-XR) 37.5 MG 24 hr capsule Take 3 capsules (112.5 mg total) by mouth daily with breakfast.     No current facility-administered medications for this visit.   There were no vitals taken for this visit.  Wt Readings from Last 3 Encounters:  07/23/21 89.4 kg (197 lb)  07/15/21 88 kg (194 lb)  07/02/21 86.5 kg (190 lb 9.6 oz)   General:  Comfortable on 3L O2 [place]. Appears older than stated age. HEENT: normal Neck: supple. + JVD. Carotids 2+ bilat; no bruits. No lymphadenopathy or thryomegaly appreciated. Cor: PMI nondisplaced. Regular rate & rhythm. No rubs, gallops or murmurs. Lungs: bibasilar crackles Abdomen: soft, nontender, nondistended. No hepatosplenomegaly. No bruits or masses. Good bowel sounds. Extremities: no cyanosis, clubbing, rash, edema, s/p BKA on right Neuro: alert & orientedx3, cranial nerves grossly intact. moves all 4 extremities w/o difficulty. Tearful   ASSESSMENT & PLAN: Acute on chronic diastolic CHF: - Multiple admissions with hypertensive urgency/diastolic CHF. Echo in 2/21 with EF 55-60%, normal RV, IVC suggesting RA pressure 8 mmHg.  [place] 3/21 showed optimized filling pressures with normal cardiac output.  With multiple diastolic CHF admissions, we placed a Cardiomems device.  - Most recent admit 10/22 with a/c HF.  - NYHA III. Volume appears slightly up on exam.  Goal dPAP 11, 18 on 11/21. On 60 mg Torsemide daily. Will have her take an extra 40 mg Torsemide for three days.  - Needs to send cardiomems more regularly, discussed today - continue Spiro 12.5 mg daily - Continue entresto 24/26 mg BID - continue farxiga 10 mg daily - Continue imdur 120 mg/hydralazine 100 mg TID - Labs today 2. Chronic hypoxemic respiratory failure: 09/2019 COVID-19 viral pneumonia. She has been on home oxygen 3L [place]. Said to have COPD but has never smoked.   -  Recent admit with a/c respiratory failure. In setting of heart failure  exacerbation and recurrent pleural effusion. Improved w/ diuresis. Now on 3 liters which is her baseline.   - Pulmonary recommended not doing thoracentesis (had significant pain in the past with thoracentesis, effusion thought to be due to CHF) or pleur-x catheter.  3. Pleural effusions: 09/2019 Had thoracenteses at [facility] and again bilaterally at [facility] in 3/21. Transudates with negative cytology. Suspect due to CHF. Has recurrent L pleural effusion - Evaluated by PCCM, Advised against repeat thoracentesis. Pleurex cath not recommended 4. HTN: Renal artery dopplers in 3/21 with nonobstructive disease.  - Continue amlodipine 10 mg daily.  - Continue hydralazine to 100 mg tid.  - Continue labetalol 300 mg bid.  - Continue clonidine 0.3 bid.   - Continue Imdur 120 mg daily  - Continue Entresto 24/26 bid.  - Continue spironolactone 12.5 daily.  - BP initially elevated, but at goal on recheck 5. CKD: Stage 3b/IV.  Suspect diabetic nephropathy. Baseline SCr historically ~2.0. BMET today. Referral to Nephrology today. 6. Carotid stenosis: recent doppler 8/22: 1-39% RICA stenosis and 29-57% LICA stenosis  7. Odynophagia: Also with GERD symptoms.  EGD (6/22) with no obvious esophageal mechanical obstruction, likely benign fundic gland polyps, cannot exclude esophageal dysmotility and gastroparesis. 8. DM: Per PCP. On Farxiga 9. CAD: In 3/21, she had PCI/orbital atherectomy/DES RCA and PCI/DES mid LAD. No intervention on moderate LCx disease. No chest pain.  - Continue Imdur 120 mg daily.  - Continue ASA 81 + Plavix 75.   - Continue atorvastatin 80 mg daily. Lipids checked (4/22). - Eventually, would transition from Plavix to rivaroxaban 2.5 mg bid given CAD and PAD.  10: PAD: S/p R BKA.   - Continue statin, ASA, Plavix.  - Arterial dopplers (5/22) showed mild left lower arterial disease.  11. Depression: Profound. Seen by psych during recent admit. Buspar and Effexor adjusted.  12. Anemia:  -  Baseline Hgb 8-9. Likely anemia of chronic disease.  - Transfused with 1 U PRBCs during recent admit d/t hgb 7.5 - Hgb stable at 9.4 on 07/02/21 - CBC today  Followup 3 weeks with APP and 3 months with Dr. Loistine Simas, PA-C 08/05/2021

## 2021-08-06 ENCOUNTER — Encounter (HOSPITAL_COMMUNITY): Payer: Medicare Other

## 2021-08-19 ENCOUNTER — Encounter (HOSPITAL_COMMUNITY): Payer: Self-pay

## 2021-08-19 ENCOUNTER — Ambulatory Visit (HOSPITAL_COMMUNITY)
Admission: RE | Admit: 2021-08-19 | Discharge: 2021-08-19 | Disposition: A | Discharge status: Home / Self Care | Payer: Medicare Other | Source: Ambulatory Visit | Attending: Family Medicine | Admitting: Family Medicine

## 2021-08-19 ENCOUNTER — Other Ambulatory Visit: Payer: Self-pay

## 2021-08-19 VITALS — BP 114/68 | HR 65 | Wt 192.0 lb

## 2021-08-19 DIAGNOSIS — I130 Hypertensive heart and chronic kidney disease with heart failure and stage 1 through stage 4 chronic kidney disease, or unspecified chronic kidney disease: Secondary | ICD-10-CM | POA: Diagnosis not present

## 2021-08-19 DIAGNOSIS — D649 Anemia, unspecified: Secondary | ICD-10-CM | POA: Diagnosis not present

## 2021-08-19 DIAGNOSIS — Z7902 Long term (current) use of antithrombotics/antiplatelets: Secondary | ICD-10-CM | POA: Diagnosis not present

## 2021-08-19 DIAGNOSIS — R1310 Dysphagia, unspecified: Secondary | ICD-10-CM | POA: Diagnosis not present

## 2021-08-19 DIAGNOSIS — I5032 Chronic diastolic (congestive) heart failure: Secondary | ICD-10-CM | POA: Insufficient documentation

## 2021-08-19 DIAGNOSIS — J440 Chronic obstructive pulmonary disease with acute lower respiratory infection: Secondary | ICD-10-CM | POA: Diagnosis not present

## 2021-08-19 DIAGNOSIS — I739 Peripheral vascular disease, unspecified: Secondary | ICD-10-CM | POA: Diagnosis not present

## 2021-08-19 DIAGNOSIS — I779 Disorder of arteries and arterioles, unspecified: Secondary | ICD-10-CM | POA: Insufficient documentation

## 2021-08-19 DIAGNOSIS — Z794 Long term (current) use of insulin: Secondary | ICD-10-CM | POA: Insufficient documentation

## 2021-08-19 DIAGNOSIS — N183 Chronic kidney disease, stage 3 unspecified: Secondary | ICD-10-CM

## 2021-08-19 DIAGNOSIS — Z955 Presence of coronary angioplasty implant and graft: Secondary | ICD-10-CM | POA: Insufficient documentation

## 2021-08-19 DIAGNOSIS — N1832 Chronic kidney disease, stage 3b: Secondary | ICD-10-CM | POA: Insufficient documentation

## 2021-08-19 DIAGNOSIS — F32A Depression, unspecified: Secondary | ICD-10-CM | POA: Diagnosis not present

## 2021-08-19 DIAGNOSIS — Z89511 Acquired absence of right leg below knee: Secondary | ICD-10-CM | POA: Diagnosis not present

## 2021-08-19 DIAGNOSIS — U071 COVID-19: Secondary | ICD-10-CM | POA: Diagnosis not present

## 2021-08-19 DIAGNOSIS — Z7984 Long term (current) use of oral hypoglycemic drugs: Secondary | ICD-10-CM | POA: Insufficient documentation

## 2021-08-19 DIAGNOSIS — E7800 Pure hypercholesterolemia, unspecified: Secondary | ICD-10-CM | POA: Insufficient documentation

## 2021-08-19 DIAGNOSIS — J90 Pleural effusion, not elsewhere classified: Secondary | ICD-10-CM | POA: Diagnosis not present

## 2021-08-19 DIAGNOSIS — Z9981 Dependence on supplemental oxygen: Secondary | ICD-10-CM | POA: Insufficient documentation

## 2021-08-19 DIAGNOSIS — K219 Gastro-esophageal reflux disease without esophagitis: Secondary | ICD-10-CM | POA: Insufficient documentation

## 2021-08-19 DIAGNOSIS — I2510 Atherosclerotic heart disease of native coronary artery without angina pectoris: Secondary | ICD-10-CM | POA: Diagnosis not present

## 2021-08-19 DIAGNOSIS — E1122 Type 2 diabetes mellitus with diabetic chronic kidney disease: Secondary | ICD-10-CM | POA: Diagnosis not present

## 2021-08-19 DIAGNOSIS — F419 Anxiety disorder, unspecified: Secondary | ICD-10-CM | POA: Insufficient documentation

## 2021-08-19 DIAGNOSIS — I6523 Occlusion and stenosis of bilateral carotid arteries: Secondary | ICD-10-CM | POA: Diagnosis not present

## 2021-08-19 DIAGNOSIS — J9611 Chronic respiratory failure with hypoxia: Secondary | ICD-10-CM | POA: Diagnosis not present

## 2021-08-19 DIAGNOSIS — I6521 Occlusion and stenosis of right carotid artery: Secondary | ICD-10-CM

## 2021-08-19 DIAGNOSIS — J1282 Pneumonia due to coronavirus disease 2019: Secondary | ICD-10-CM | POA: Insufficient documentation

## 2021-08-19 DIAGNOSIS — Z7982 Long term (current) use of aspirin: Secondary | ICD-10-CM | POA: Insufficient documentation

## 2021-08-19 DIAGNOSIS — Z79899 Other long term (current) drug therapy: Secondary | ICD-10-CM | POA: Diagnosis not present

## 2021-08-19 DIAGNOSIS — D631 Anemia in chronic kidney disease: Secondary | ICD-10-CM

## 2021-08-19 DIAGNOSIS — I10 Essential (primary) hypertension: Secondary | ICD-10-CM | POA: Diagnosis not present

## 2021-08-19 DIAGNOSIS — E1151 Type 2 diabetes mellitus with diabetic peripheral angiopathy without gangrene: Secondary | ICD-10-CM | POA: Insufficient documentation

## 2021-08-19 LAB — BASIC METABOLIC PANEL
Anion gap: 9 (ref 5–15)
BUN: 39 mg/dL — ABNORMAL HIGH (ref 8–23)
CO2: 24 mmol/L (ref 22–32)
Calcium: 9 mg/dL (ref 8.9–10.3)
Chloride: 104 mmol/L (ref 98–111)
Creatinine, Ser: 1.84 mg/dL — ABNORMAL HIGH (ref 0.44–1.00)
GFR, Estimated: 31 mL/min — ABNORMAL LOW (ref >60)
Glucose, Bld: 238 mg/dL — ABNORMAL HIGH (ref 70–99)
Potassium: 4.3 mmol/L (ref 3.5–5.1)
Sodium: 137 mmol/L (ref 135–145)

## 2021-08-19 NOTE — Patient Instructions (Signed)
It was great to see you today! No medication changes are needed at this time.   Labs today We will only contact you if something comes back abnormal or we need to make some changes. Otherwise no news is good news!  You have been referred to [facility] Address: [address] [place], [place] [address] Phone: [phone] -they will be in contact with an appointment  Keep follow up as scheduled with cardiology  Do the following things EVERYDAY: Weigh yourself in the morning before breakfast. Write it down and keep it in a log. Take your medicines as prescribed Eat low salt foods--Limit salt (sodium) to 2000 mg per day.  Stay as active as you can everyday Limit all fluids for the day to less than 2 liters  At the [facility], you and your health needs are our priority. As part of our continuing mission to provide you with exceptional heart care, we have created designated Provider Care Teams. These Care Teams include your primary Cardiologist (physician) and Advanced Practice Providers (APPs- Physician Assistants and Nurse Practitioners) who all work together to provide you with the care you need, when you need it.   You may see any of the following providers on your designated Care Team at your next follow up: Dr Glori Bickers Dr Haynes Kerns, NP Lyda Jester, [place] [facility] [place], [place] Audry Riles, PharmD   Please be sure to bring in all your medications bottles to every appointment.    If you have any questions or concerns before your next appointment please send [place] a message through [place] or call our office at [phone].    TO LEAVE A MESSAGE FOR THE NURSE SELECT OPTION 2, PLEASE LEAVE A MESSAGE INCLUDING: YOUR NAME DATE OF BIRTH CALL BACK NUMBER REASON FOR CALL**this is important as we prioritize the call backs  YOU WILL RECEIVE A CALL BACK THE SAME DAY AS LONG AS YOU CALL BEFORE 4:00 PM

## 2021-08-19 NOTE — Progress Notes (Signed)
ReDS Vest / Clip - 08/19/21 1400       ReDS Vest / Clip   Station Marker B    Ruler Value 34    ReDS Value Range Low volume    ReDS Actual Value 28

## 2021-08-19 NOTE — Progress Notes (Signed)
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  Several hospitalizations at [facility] for acute on chronic respiratory failure, bilateral pleural effusions and acute diastolic HF (05/16/25-03/25/40 and 10/01/19-10/06/19)  + thoracentesis 10/04/19 of 1 L (patient had had 4 other thoracenteses in the prior year).   Admitted 10/21/19 to [facility] with prolonged hospitalization due to COVID-19 PNA, respiratory failure, A/C diastolic HF and CAD with PCI to LAD and RCA. Also had Cardiomems placed given recurrent diastolic CHF.  She lives at [facility].    She returned 4/22 for f/u. PFTs reordered to investigate her lung disease.   Cardiomems (11/26/20) PADP 8; deactivated 03/16/21. Says she has reported broken equipment to company.  Echo 03/17/21: EF 60-65%, RV normal.  Admitted 06/17/2021 with acute on chronic hypoxemic respiratory failure secondary to a/c diastolic HF and recurrent L pleural effusion. Pulmonary did not recommend thoracentesis, had significant pain with procedure in the past. Diuresed well with IV lasix. Weight 87.7 kg.   Seen for hospital f/u, weight 190 lb. Volume OK. Weight up 4 lbs at follow up and her torsemide was increased by extra 40 mg daily x 3 days.  Evaluated in ED 07/23/21 for AMS, abd pain and vomiting. CT head, abd/pelvis unremarkable, responded to po fluids and zofran. Discharged back to facility.  Today she returns for HF follow up with her husband and daughter in law. Overall feeling fine. No SOB with transfers out of her  wheelchair. Remains on 3 L oxygen continuously.. Currently back at home for a short trial from her SNF, family trying to see if they can manage her at home. She now has Medicare. Does not have internet at home and has not been using CardioMems. Denies abnormal bleeding, CP, dizziness, edema, or PND/Orthopnea. Appetite ok. No fever or chills. Taking all medications.  ReDs: 28%  ROS: All systems negative except as listed in HPI, PMH and Problem List.  SH:  Social History   Socioeconomic History   Marital status: Unknown    Spouse name: Not on file   Number of children: Not on file   Years of education: Not on file   Highest education level: Not on file  Occupational History   Not on file  Tobacco Use   Smoking status: Never   Smokeless tobacco: Never  Substance and Sexual Activity   Alcohol use: Never   Drug use: Never   Sexual activity: Not on file  Other Topics Concern   Not on file  Social History Narrative   Not on file   Social Determinants of Health   Financial Resource Strain: Not on file  Food Insecurity: Not on file  Transportation Needs: Not on file  Physical Activity: Not on file  Stress: Not on file  Social Connections: Not on file  Intimate Partner Violence: Not on file   FH:  Family History  Problem Relation Age of Onset   Hypertension Brother    Diabetes Brother    Hypertension Maternal Grandmother    Heart attack Paternal Grandmother    Current Outpatient Medications  Medication Sig Dispense Refill  acetaminophen (TYLENOL) 325 MG tablet Take 650 mg by mouth every 6 (six) hours as needed for headache (or pain).     albuterol (PROVENTIL) (2.5 MG/3ML) 0.083% nebulizer solution Take 2.5 mg by nebulization every 4 (four) hours as needed for wheezing or shortness of breath.     albuterol (VENTOLIN HFA) 108 (90 Base) MCG/ACT inhaler Inhale 2 puffs into the lungs every 6 (six) hours as needed for wheezing or shortness of breath.     amLODipine (NORVASC) 10  MG tablet Take 1 tablet (10 mg total) by mouth daily. 30 tablet 0   aspirin EC 81 MG EC tablet Take 1 tablet (81 mg total) by mouth daily. 30 tablet 0   atorvastatin (LIPITOR) 80 MG tablet Take 1 tablet (80 mg total) by mouth at bedtime.     busPIRone (BUSPAR) 10 MG tablet Take 1 tablet (10 mg total) by mouth 3 (three) times daily.     cholecalciferol (VITAMIN D3) 25 MCG (1000 UNIT) tablet Take 1,000 Units by mouth daily.     clonazePAM (KLONOPIN) 0.25 MG disintegrating tablet Take 1 tablet (0.25 mg total) by mouth 2 (two) times daily. (Patient taking differently: Take 0.25 mg by mouth in the morning and at bedtime.) 60 tablet 0   cloNIDine (CATAPRES) 0.3 MG tablet Take 1 tablet (0.3 mg total) by mouth 2 (two) times daily. (Patient taking differently: Take 0.3 mg by mouth in the morning and at bedtime.) 60 tablet 0   clopidogrel (PLAVIX) 75 MG tablet Take 75 mg by mouth daily.     dapagliflozin propanediol (FARXIGA) 10 MG TABS tablet Take 1 tablet (10 mg total) by mouth daily. 30 tablet    dicyclomine (BENTYL) 10 MG capsule Take 10 mg by mouth every 6 (six) hours as needed for spasms.     ferrous sulfate 325 (65 FE) MG tablet Take 325 mg by mouth in the morning and at bedtime.     fluticasone-salmeterol (ADVAIR) 250-50 MCG/ACT AEPB Inhale 1 puff into the lungs in the morning and at bedtime.     gabapentin (NEURONTIN) 100 MG capsule Take 1 capsule (100 mg total) by mouth 3 (three) times daily.     Glucagon, rDNA, (GLUCAGON EMERGENCY) 1 MG KIT Inject 1 mg as directed See admin instructions. Inject 1 mg into the skin every 15 minutes as needed for blood sugar below 60     hydrALAZINE (APRESOLINE) 100 MG tablet Take 1 tablet (100 mg total) by mouth 3 (three) times daily.     insulin aspart (NOVOLOG) 100 UNIT/ML injection Inject 0-9 Units into the skin 3 (three) times daily with meals. (Patient taking differently: Inject 1-9 Units into the skin in the morning, at noon, in the evening, and at bedtime.  Inject into the skin 3 times a day before meals, per sliding scale: BGL 201-250 = 1 unit; 251-300 = 3 units; 301-350 = 5 units; 351-400 = 7 units; 401-450 = 9 units; >450 = CALL MD) 10 mL 11   insulin detemir (LEVEMIR) 100 UNIT/ML injection Inject 28 Units into the skin in the morning and at bedtime.     isosorbide mononitrate (IMDUR) 120 MG 24 hr tablet Take 1 tablet (120 mg total) by mouth daily.     labetalol (NORMODYNE) 300 MG tablet Take 300 mg by mouth in the morning and at bedtime.     LACTOBACILLUS PO Take 1 capsule by mouth in the morning.     loperamide (IMODIUM A-D) 2 MG tablet Take 2 mg by  mouth every 6 (six) hours as needed (for loose stools).     magnesium oxide (MAG-OX) 400 MG tablet Take 400 mg by mouth in the morning and at bedtime.     meclizine (ANTIVERT) 25 MG tablet Take 25 mg by mouth every 8 (eight) hours as needed for dizziness.     metoCLOPramide (REGLAN) 5 MG tablet Take 2.5 mg by mouth 3 (three) times daily before meals.     Multiple Vitamin (MULTIVITAMIN WITH MINERALS) TABS tablet Take 1 tablet by mouth daily. 30 tablet 0   nitroGLYCERIN (NITROSTAT) 0.4 MG SL tablet Place 0.4 mg under the tongue every 5 (five) minutes x 3 doses as needed for chest pain.     ondansetron (ZOFRAN) 4 MG tablet Take 4 mg by mouth every 6 (six) hours as needed for nausea or vomiting.     OXYGEN Inhale 3 L/min into the lungs continuous.     pantoprazole (PROTONIX) 40 MG tablet Take 40 mg by mouth daily.     polyethylene glycol (MIRALAX / GLYCOLAX) 17 g packet Take 17 g by mouth daily.     polyvinyl alcohol (LIQUIFILM TEARS) 1.4 % ophthalmic solution Place 1 drop into both eyes every 8 (eight) hours as needed for dry eyes.     sacubitril-valsartan (ENTRESTO) 24-26 MG Take 1 tablet by mouth 2 (two) times daily. 60 tablet    senna (SENOKOT) 8.6 MG TABS tablet Take 2 tablets (17.2 mg total) by mouth at bedtime. (Patient taking differently: Take 1-2 tablets by mouth See admin instructions. Take 2  tablets by mouth at bedtime and an additional 1 tablet every 12 hours as needed for constipation) 120 tablet 0   spironolactone (ALDACTONE) 25 MG tablet Take 0.5 tablets (12.5 mg total) by mouth daily.     torsemide 60 MG TABS Take 60 mg by mouth daily.     venlafaxine XR (EFFEXOR-XR) 37.5 MG 24 hr capsule Take 3 capsules (112.5 mg total) by mouth daily with breakfast.     No current facility-administered medications for this encounter.   BP 114/68    Pulse 65    Wt 87.1 kg (192 lb)    SpO2 99%    BMI 34.01 kg/m   Wt Readings from Last 3 Encounters:  08/19/21 87.1 kg (192 lb)  07/23/21 89.4 kg (197 lb)  07/15/21 88 kg (194 lb)   PHYSICAL EXAM: General:  NAD. No resp difficulty, appears older than stated age, arrived in [facility] on oxygen. HEENT: Normal Neck: Supple. No JVD. Carotids 2+ bilat; no bruits. No lymphadenopathy or thryomegaly appreciated. Cor: PMI nondisplaced. Regular rate & rhythm. No rubs, gallops or murmurs. Lungs: Clear Abdomen: Soft, nontender, nondistended. No hepatosplenomegaly. No bruits or masses. Good bowel sounds. Left lower back TTP on flank. Extremities: No cyanosis, clubbing, rash, edema; R BKA Neuro: Alert & oriented x 3, cranial nerves grossly intact. Moves all 4 extremities w/o difficulty. Affect pleasant.  ASSESSMENT & PLAN: 1. Chronic diastolic [id number] admissions with hypertensive urgency/diastolic CHF. Echo in 2/21 with EF 55-60%, normal RV, IVC suggesting RA pressure 8 mmHg.  [place] 3/21 showed optimized filling pressures with normal cardiac output.  With multiple diastolic CHF admissions, we placed a Cardiomems device. Most recent admit 10/22 with a/c HF. Stable NYHA III, limited mostly by physical deconditioning and right BKA w/ no prosthesis. She is not volume overloaded on exam or by ReDs clip today.  No cardioMems readings since 07/13/21. - Continue torsemide 60 mg daily. BMET today. - Needs to send  cardiomems more regularly, discussed today. Daughter in  law will help facilitate this and help her access internet. - Continue Spiro 12.5 mg daily. - Continue Entresto 24/26 mg bid. - Continue Farxiga 10 mg daily. - Continue Imdur 120 mg/hydralazine 100 mg tid. 2. Chronic hypoxemic respiratory failure: 09/2019 COVID-19 viral pneumonia. She has been on home oxygen 3L [place]. Said to have COPD but has never smoked.   - Recent admit with a/c respiratory failure. In setting of heart failure exacerbation and recurrent pleural effusion. Improved w/ diuresis. Now on 3 liters which is her baseline.   - Pulmonary recommended not doing thoracentesis (had significant pain in the past with thoracentesis, effusion thought to be due to CHF) or pleur-x catheter.  3. Pleural effusions: 09/2019 Had thoracenteses at [facility] and again bilaterally at [facility] in 3/21. Transudates with negative cytology. Suspect due to CHF. Has recurrent L pleural effusion - Evaluated by PCCM, Advised against repeat thoracentesis. Pleurex cath not recommended. 4. HTN: Renal artery dopplers in 3/21 with nonobstructive disease. BP well-controlled today. - Continue amlodipine 10 mg daily.  - Continue hydralazine 100 mg tid.  - Continue labetalol 300 mg bid.  - Continue clonidine 0.3 mg bid.   - Continue Imdur 120 mg daily.  - Continue Entresto 24/26 mg  bid.  - Continue spironolactone 12.5 daily.  5. CKD: Stage 3b.  Suspect diabetic nephropathy. Baseline SCr historically ~2.0. BMET today. She has been referred to Nephrology, will check status of referral as family has not heard back. 6. Carotid stenosis: recent doppler 8/22: 1-39% RICA stenosis and 95-18% LICA stenosis  7. Odynophagia: Also with GERD symptoms.  EGD (6/22) with no obvious esophageal mechanical obstruction, likely benign fundic gland polyps, cannot exclude esophageal dysmotility and gastroparesis. 8. DM: Per PCP. On Farxiga. No GU symptoms. 9. CAD: In 3/21, she had PCI/orbital atherectomy/DES RCA and PCI/DES mid LAD. No intervention  on moderate LCx disease. No chest pain.  - Continue Imdur 120 mg daily.  - Continue ASA 81 + Plavix 75.   - Continue atorvastatin 80 mg daily. Lipids checked (4/22). - Eventually, would transition from Plavix to rivaroxaban 2.5 mg bid given CAD and PAD, but will defer to Dr. Aundra Dubin at next visit. 10: PAD: S/p R BKA.   - Continue statin, ASA, Plavix.  - Arterial dopplers (5/22) showed mild left lower arterial disease.  11. Depression: Profound. Seen by psych during recent admit. Buspar and Effexor adjusted. Mood seems improved today, probably helping that she is back home for the time being. 12. Anemia: Baseline Hgb 8-9. Likely anemia of chronic disease. Recent CBC stable.  Follow up with Dr. Aundra Dubin in 2 months as scheduled.  [place], [place] 08/19/2021

## 2021-09-29 ENCOUNTER — Encounter (HOSPITAL_COMMUNITY): Payer: Medicare Other | Admitting: Cardiology

## 2021-10-12 ENCOUNTER — Encounter (HOSPITAL_COMMUNITY): Payer: Medicare Other | Admitting: Cardiology

## 2021-10-20 NOTE — Progress Notes (Incomplete)
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  Several hospitalizations at [facility] for acute on chronic respiratory failure, bilateral pleural effusions and acute diastolic HF (0/45/99-02/27/40 and 10/01/19-10/06/19)  + thoracentesis 10/04/19 of 1 L (patient had had 4 other thoracenteses in the prior year).   Admitted 10/21/19 to [facility] with prolonged hospitalization due to COVID-19 PNA, respiratory failure, A/C diastolic HF and CAD with PCI to LAD and RCA. Also had Cardiomems placed given recurrent diastolic CHF.  She lives at [facility].    She returned 4/22 for f/u. PFTs reordered to investigate her lung disease.   Cardiomems (11/26/20) PADP 8; deactivated 03/16/21. Says she has reported broken equipment to company.  Echo 03/17/21: EF 60-65%, RV normal.  Admitted 06/17/2021 with acute on chronic hypoxemic respiratory failure secondary to a/c diastolic HF and recurrent L pleural effusion. Pulmonary did not recommend thoracentesis, had significant pain with procedure in the past. Diuresed well with IV lasix. Weight 87.7 kg.   Seen for hospital f/u, weight 190 lb. Volume OK. Weight up 4 lbs at follow up and her torsemide was increased by extra 40 mg daily x 3 days.  Evaluated in ED 07/23/21 for AMS, abd pain and vomiting. CT head, abd/pelvis unremarkable, responded to po fluids and zofran. Discharged back to facility.  Today she returns for HF follow up with her husband and daughter in law. Overall feeling fine. No SOB with transfers out of her  wheelchair. Remains on 3 L oxygen continuously.. Currently back at home for a short trial from her SNF, family trying to see if they can manage her at home. She now has Medicare. Does not have internet at home and has not been using CardioMems. Denies abnormal bleeding, CP, dizziness, edema, or PND/Orthopnea. Appetite ok. No fever or chills. Taking all medications.  Admitted to [facility] 2/23 for 9 days for a/c CHF exacerbation. Diuresed with IV lasix. Entresto stopped with elevated SCr.   ReDs: 28%  ROS: All systems negative except as listed in HPI, PMH and Problem List.  SH:  Social History   Socioeconomic History   Marital status: Unknown    Spouse name: Not on file   Number of children: Not on file   Years of education: Not on file   Highest education level: Not on file  Occupational History   Not on file  Tobacco Use   Smoking status: Never   Smokeless tobacco: Never  Substance and Sexual Activity   Alcohol use: Never   Drug use: Never   Sexual activity: Not on file  Other Topics Concern   Not on file  Social History Narrative   Not on file   Social Determinants of Health   Financial Resource Strain: Not on file  Food Insecurity: Not on file  Transportation Needs: Not on file  Physical Activity: Not on file  Stress: Not on file  Social Connections: Not on file  Intimate Partner Violence: Not on file   FH:  Family History  Problem Relation Age of Onset   Hypertension Brother    Diabetes Brother  Hypertension Maternal Grandmother    Heart attack Paternal Grandmother    Current Outpatient Medications  Medication Sig Dispense Refill   acetaminophen (TYLENOL) 325 MG tablet Take 650 mg by mouth every 6 (six) hours as needed for headache (or pain).     albuterol (PROVENTIL) (2.5 MG/3ML) 0.083% nebulizer solution Take 2.5 mg by nebulization every 4 (four) hours as needed for wheezing or shortness of breath.     albuterol (VENTOLIN HFA) 108 (90 Base) MCG/ACT inhaler  Inhale 2 puffs into the lungs every 6 (six) hours as needed for wheezing or shortness of breath.     amLODipine (NORVASC) 10 MG tablet Take 1 tablet (10 mg total) by mouth daily. 30 tablet 0   aspirin EC 81 MG EC tablet Take 1 tablet (81 mg total) by mouth daily. 30 tablet 0   atorvastatin (LIPITOR) 80 MG tablet Take 1 tablet (80 mg total) by mouth at bedtime.     busPIRone (BUSPAR) 10 MG tablet Take 1 tablet (10 mg total) by mouth 3 (three) times daily.     cholecalciferol (VITAMIN D3) 25 MCG (1000 UNIT) tablet Take 1,000 Units by mouth daily.     clonazePAM (KLONOPIN) 0.25 MG disintegrating tablet Take 1 tablet (0.25 mg total) by mouth 2 (two) times daily. (Patient taking differently: Take 0.25 mg by mouth in the morning and at bedtime.) 60 tablet 0   cloNIDine (CATAPRES) 0.3 MG tablet Take 1 tablet (0.3 mg total) by mouth 2 (two) times daily. (Patient taking differently: Take 0.3 mg by mouth in the morning and at bedtime.) 60 tablet 0   clopidogrel (PLAVIX) 75 MG tablet Take 75 mg by mouth daily.     dapagliflozin propanediol (FARXIGA) 10 MG TABS tablet Take 1 tablet (10 mg total) by mouth daily. 30 tablet    dicyclomine (BENTYL) 10 MG capsule Take 10 mg by mouth every 6 (six) hours as needed for spasms.     ferrous sulfate 325 (65 FE) MG tablet Take 325 mg by mouth in the morning and at bedtime.     fluticasone-salmeterol (ADVAIR) 250-50 MCG/ACT AEPB Inhale 1 puff into the lungs in the morning and at bedtime.     gabapentin (NEURONTIN) 100 MG capsule Take 1 capsule (100 mg total) by mouth 3 (three) times daily.     Glucagon, rDNA, (GLUCAGON EMERGENCY) 1 MG KIT Inject 1 mg as directed See admin instructions. Inject 1 mg into the skin every 15 minutes as needed for blood sugar below 60     hydrALAZINE (APRESOLINE) 100 MG tablet Take 1 tablet (100 mg total) by mouth 3 (three) times daily.     insulin aspart (NOVOLOG) 100 UNIT/ML injection Inject 0-9 Units into the skin 3 (three) times daily with  meals. (Patient taking differently: Inject 1-9 Units into the skin in the morning, at noon, in the evening, and at bedtime. Inject into the skin 3 times a day before meals, per sliding scale: BGL 201-250 = 1 unit; 251-300 = 3 units; 301-350 = 5 units; 351-400 = 7 units; 401-450 = 9 units; >450 = CALL MD) 10 mL 11   insulin detemir (LEVEMIR) 100 UNIT/ML injection Inject 28 Units into the skin in the morning and at bedtime.     isosorbide mononitrate (IMDUR) 120 MG 24 hr tablet Take 1 tablet (120 mg total) by mouth daily.     labetalol (NORMODYNE) 300 MG tablet Take 300 mg by mouth in the morning and at bedtime.     LACTOBACILLUS  PO Take 1 capsule by mouth in the morning.     loperamide (IMODIUM A-D) 2 MG tablet Take 2 mg by mouth every 6 (six) hours as needed (for loose stools).     magnesium oxide (MAG-OX) 400 MG tablet Take 400 mg by mouth in the morning and at bedtime.     meclizine (ANTIVERT) 25 MG tablet Take 25 mg by mouth every 8 (eight) hours as needed for dizziness.     metoCLOPramide (REGLAN) 5 MG tablet Take 2.5 mg by mouth 3 (three) times daily before meals.     Multiple Vitamin (MULTIVITAMIN WITH MINERALS) TABS tablet Take 1 tablet by mouth daily. 30 tablet 0   nitroGLYCERIN (NITROSTAT) 0.4 MG SL tablet Place 0.4 mg under the tongue every 5 (five) minutes x 3 doses as needed for chest pain.     ondansetron (ZOFRAN) 4 MG tablet Take 4 mg by mouth every 6 (six) hours as needed for nausea or vomiting.     OXYGEN Inhale 3 L/min into the lungs continuous.     pantoprazole (PROTONIX) 40 MG tablet Take 40 mg by mouth daily.     polyethylene glycol (MIRALAX / GLYCOLAX) 17 g packet Take 17 g by mouth daily.     polyvinyl alcohol (LIQUIFILM TEARS) 1.4 % ophthalmic solution Place 1 drop into both eyes every 8 (eight) hours as needed for dry eyes.     sacubitril-valsartan (ENTRESTO) 24-26 MG Take 1 tablet by mouth 2 (two) times daily. 60 tablet    senna (SENOKOT) 8.6 MG TABS tablet Take 2 tablets  (17.2 mg total) by mouth at bedtime. (Patient taking differently: Take 1-2 tablets by mouth See admin instructions. Take 2 tablets by mouth at bedtime and an additional 1 tablet every 12 hours as needed for constipation) 120 tablet 0   spironolactone (ALDACTONE) 25 MG tablet Take 0.5 tablets (12.5 mg total) by mouth daily.     torsemide 60 MG TABS Take 60 mg by mouth daily.     venlafaxine XR (EFFEXOR-XR) 37.5 MG 24 hr capsule Take 3 capsules (112.5 mg total) by mouth daily with breakfast.     No current facility-administered medications for this visit.   There were no vitals taken for this visit.  Wt Readings from Last 3 Encounters:  08/19/21 87.1 kg (192 lb)  07/23/21 89.4 kg (197 lb)  07/15/21 88 kg (194 lb)   PHYSICAL EXAM: General:  NAD. No resp difficulty, appears older than stated age, arrived in [facility] on oxygen. HEENT: Normal Neck: Supple. No JVD. Carotids 2+ bilat; no bruits. No lymphadenopathy or thryomegaly appreciated. Cor: PMI nondisplaced. Regular rate & rhythm. No rubs, gallops or murmurs. Lungs: Clear Abdomen: Soft, nontender, nondistended. No hepatosplenomegaly. No bruits or masses. Good bowel sounds. Left lower back TTP on flank. Extremities: No cyanosis, clubbing, rash, edema; R BKA Neuro: Alert & oriented x 3, cranial nerves grossly intact. Moves all 4 extremities w/o difficulty. Affect pleasant.  ASSESSMENT & PLAN: 1. Chronic diastolic [id number] admissions with hypertensive urgency/diastolic CHF. Echo in 2/21 with EF 55-60%, normal RV, IVC suggesting RA pressure 8 mmHg.  [place] 3/21 showed optimized filling pressures with normal cardiac output.  With multiple diastolic CHF admissions, we placed a Cardiomems device. Most recent admit 10/22 with a/c HF. Stable NYHA III, limited mostly by physical deconditioning and right BKA w/ no prosthesis. She is not volume overloaded on exam or by ReDs clip today.  No cardioMems readings since 07/13/21. - Continue torsemide 60 mg daily.  BMET  today. - Needs to send cardiomems more regularly, discussed today. Daughter in law will help facilitate this and help her access internet. - Continue Spiro 12.5 mg daily. - Continue Entresto 24/26 mg bid. - Continue Farxiga 10 mg daily. - Continue Imdur 120 mg/hydralazine 100 mg tid. 2. Chronic hypoxemic respiratory failure: 09/2019 COVID-19 viral pneumonia. She has been on home oxygen 3L [place]. Said to have COPD but has never smoked.   - Recent admit with a/c respiratory failure. In setting of heart failure exacerbation and recurrent pleural effusion. Improved w/ diuresis. Now on 3 liters which is her baseline.   - Pulmonary recommended not doing thoracentesis (had significant pain in the past with thoracentesis, effusion thought to be due to CHF) or pleur-x catheter.  3. Pleural effusions: 09/2019 Had thoracenteses at [facility] and again bilaterally at [facility] in 3/21. Transudates with negative cytology. Suspect due to CHF. Has recurrent L pleural effusion - Evaluated by PCCM, Advised against repeat thoracentesis. Pleurex cath not recommended. 4. HTN: Renal artery dopplers in 3/21 with nonobstructive disease. BP well-controlled today. - Continue amlodipine 10 mg daily.  - Continue hydralazine 100 mg tid.  - Continue labetalol 300 mg bid.  - Continue clonidine 0.3 mg bid.   - Continue Imdur 120 mg daily.  - Continue Entresto 24/26 mg  bid.  - Continue spironolactone 12.5 daily.  5. CKD: Stage 3b.  Suspect diabetic nephropathy. Baseline SCr historically ~2.0. BMET today. She has been referred to Nephrology, will check status of referral as family has not heard back. 6. Carotid stenosis: recent doppler 8/22: 1-39% RICA stenosis and 83-41% LICA stenosis  7. Odynophagia: Also with GERD symptoms.  EGD (6/22) with no obvious esophageal mechanical obstruction, likely benign fundic gland polyps, cannot exclude esophageal dysmotility and gastroparesis. 8. DM: Per PCP. On Farxiga. No GU symptoms. 9. CAD: In  3/21, she had PCI/orbital atherectomy/DES RCA and PCI/DES mid LAD. No intervention on moderate LCx disease. No chest pain.  - Continue Imdur 120 mg daily.  - Continue ASA 81 + Plavix 75.   - Continue atorvastatin 80 mg daily. Lipids checked (4/22). - Eventually, would transition from Plavix to rivaroxaban 2.5 mg bid given CAD and PAD, but will defer to Dr. Aundra Dubin at next visit. 10: PAD: S/p R BKA.   - Continue statin, ASA, Plavix.  - Arterial dopplers (5/22) showed mild left lower arterial disease.  11. Depression: Profound. Seen by psych during recent admit. Buspar and Effexor adjusted. Mood seems improved today, probably helping that she is back home for the time being. 12. Anemia: Baseline Hgb 8-9. Likely anemia of chronic disease. Recent CBC stable.  Follow up with Dr. Aundra Dubin in 2 months as scheduled.  [place], [place] 10/20/2021

## 2021-10-21 ENCOUNTER — Encounter (HOSPITAL_COMMUNITY): Payer: Medicare Other

## 2021-11-10 ENCOUNTER — Telehealth: Payer: Self-pay

## 2021-11-10 NOTE — Telephone Encounter (Signed)
NOTES SCANNED TO REFERAL 

## 2021-12-21 DEATH — deceased
